# Patient Record
Sex: Male | Born: 1950 | Race: White | Hispanic: No | Marital: Single | Smoking: Never smoker
Health system: Southern US, Community
[De-identification: ages and names within clinical notes are randomized; demographics above are authoritative.]

## PROBLEM LIST (undated history)

## (undated) DIAGNOSIS — I4891 Unspecified atrial fibrillation: Secondary | ICD-10-CM

## (undated) DIAGNOSIS — N189 Chronic kidney disease, unspecified: Secondary | ICD-10-CM

## (undated) DIAGNOSIS — I350 Nonrheumatic aortic (valve) stenosis: Secondary | ICD-10-CM

## (undated) DIAGNOSIS — I639 Cerebral infarction, unspecified: Secondary | ICD-10-CM

## (undated) DIAGNOSIS — I739 Peripheral vascular disease, unspecified: Secondary | ICD-10-CM

## (undated) DIAGNOSIS — I499 Cardiac arrhythmia, unspecified: Secondary | ICD-10-CM

---

## 2017-08-22 ENCOUNTER — Emergency Department (HOSPITAL_COMMUNITY): Payer: Medicare Other

## 2017-08-22 ENCOUNTER — Inpatient Hospital Stay (HOSPITAL_COMMUNITY)
Admission: EM | Admit: 2017-08-22 | Discharge: 2017-09-05 | DRG: 981 | Disposition: A | Discharge status: Skilled Nursing Facility | Payer: Medicare Other | Attending: Internal Medicine | Admitting: Internal Medicine

## 2017-08-22 ENCOUNTER — Encounter (HOSPITAL_COMMUNITY): Payer: Self-pay | Admitting: Emergency Medicine

## 2017-08-22 DIAGNOSIS — D649 Anemia, unspecified: Secondary | ICD-10-CM | POA: Diagnosis not present

## 2017-08-22 DIAGNOSIS — M79A22 Nontraumatic compartment syndrome of left lower extremity: Secondary | ICD-10-CM | POA: Diagnosis present

## 2017-08-22 DIAGNOSIS — E860 Dehydration: Secondary | ICD-10-CM

## 2017-08-22 DIAGNOSIS — I639 Cerebral infarction, unspecified: Secondary | ICD-10-CM

## 2017-08-22 DIAGNOSIS — I63412 Cerebral infarction due to embolism of left middle cerebral artery: Secondary | ICD-10-CM | POA: Diagnosis not present

## 2017-08-22 DIAGNOSIS — I69351 Hemiplegia and hemiparesis following cerebral infarction affecting right dominant side: Secondary | ICD-10-CM | POA: Diagnosis not present

## 2017-08-22 DIAGNOSIS — G8191 Hemiplegia, unspecified affecting right dominant side: Secondary | ICD-10-CM | POA: Diagnosis not present

## 2017-08-22 DIAGNOSIS — I998 Other disorder of circulatory system: Secondary | ICD-10-CM | POA: Diagnosis present

## 2017-08-22 DIAGNOSIS — Z66 Do not resuscitate: Secondary | ICD-10-CM | POA: Diagnosis present

## 2017-08-22 DIAGNOSIS — I5043 Acute on chronic combined systolic (congestive) and diastolic (congestive) heart failure: Secondary | ICD-10-CM

## 2017-08-22 DIAGNOSIS — R29810 Facial weakness: Secondary | ICD-10-CM | POA: Diagnosis present

## 2017-08-22 DIAGNOSIS — I959 Hypotension, unspecified: Secondary | ICD-10-CM | POA: Diagnosis not present

## 2017-08-22 DIAGNOSIS — R29703 NIHSS score 3: Secondary | ICD-10-CM | POA: Diagnosis present

## 2017-08-22 DIAGNOSIS — R300 Dysuria: Secondary | ICD-10-CM | POA: Diagnosis present

## 2017-08-22 DIAGNOSIS — E870 Hyperosmolality and hypernatremia: Secondary | ICD-10-CM | POA: Diagnosis present

## 2017-08-22 DIAGNOSIS — I7092 Chronic total occlusion of artery of the extremities: Secondary | ICD-10-CM | POA: Diagnosis not present

## 2017-08-22 DIAGNOSIS — R4701 Aphasia: Secondary | ICD-10-CM | POA: Diagnosis present

## 2017-08-22 DIAGNOSIS — I739 Peripheral vascular disease, unspecified: Secondary | ICD-10-CM | POA: Diagnosis not present

## 2017-08-22 DIAGNOSIS — I69391 Dysphagia following cerebral infarction: Secondary | ICD-10-CM

## 2017-08-22 DIAGNOSIS — D62 Acute posthemorrhagic anemia: Secondary | ICD-10-CM | POA: Diagnosis not present

## 2017-08-22 DIAGNOSIS — R1312 Dysphagia, oropharyngeal phase: Secondary | ICD-10-CM | POA: Diagnosis not present

## 2017-08-22 DIAGNOSIS — R531 Weakness: Secondary | ICD-10-CM | POA: Insufficient documentation

## 2017-08-22 DIAGNOSIS — R4182 Altered mental status, unspecified: Secondary | ICD-10-CM | POA: Diagnosis not present

## 2017-08-22 DIAGNOSIS — M79A21 Nontraumatic compartment syndrome of right lower extremity: Secondary | ICD-10-CM | POA: Diagnosis present

## 2017-08-22 DIAGNOSIS — R471 Dysarthria and anarthria: Secondary | ICD-10-CM | POA: Diagnosis not present

## 2017-08-22 DIAGNOSIS — G936 Cerebral edema: Secondary | ICD-10-CM | POA: Diagnosis present

## 2017-08-22 DIAGNOSIS — I743 Embolism and thrombosis of arteries of the lower extremities: Secondary | ICD-10-CM | POA: Diagnosis present

## 2017-08-22 DIAGNOSIS — E871 Hypo-osmolality and hyponatremia: Secondary | ICD-10-CM | POA: Diagnosis not present

## 2017-08-22 DIAGNOSIS — R001 Bradycardia, unspecified: Secondary | ICD-10-CM | POA: Diagnosis not present

## 2017-08-22 DIAGNOSIS — M6281 Muscle weakness (generalized): Secondary | ICD-10-CM | POA: Diagnosis not present

## 2017-08-22 DIAGNOSIS — N179 Acute kidney failure, unspecified: Secondary | ICD-10-CM | POA: Diagnosis not present

## 2017-08-22 DIAGNOSIS — Z9181 History of falling: Secondary | ICD-10-CM | POA: Diagnosis not present

## 2017-08-22 DIAGNOSIS — Z823 Family history of stroke: Secondary | ICD-10-CM | POA: Diagnosis not present

## 2017-08-22 DIAGNOSIS — M6282 Rhabdomyolysis: Secondary | ICD-10-CM | POA: Diagnosis not present

## 2017-08-22 DIAGNOSIS — Z48812 Encounter for surgical aftercare following surgery on the circulatory system: Secondary | ICD-10-CM | POA: Diagnosis not present

## 2017-08-22 DIAGNOSIS — D72829 Elevated white blood cell count, unspecified: Secondary | ICD-10-CM | POA: Diagnosis present

## 2017-08-22 DIAGNOSIS — E785 Hyperlipidemia, unspecified: Secondary | ICD-10-CM | POA: Diagnosis present

## 2017-08-22 DIAGNOSIS — R2681 Unsteadiness on feet: Secondary | ICD-10-CM | POA: Diagnosis not present

## 2017-08-22 DIAGNOSIS — R41841 Cognitive communication deficit: Secondary | ICD-10-CM | POA: Diagnosis not present

## 2017-08-22 DIAGNOSIS — R1310 Dysphagia, unspecified: Secondary | ICD-10-CM | POA: Diagnosis present

## 2017-08-22 DIAGNOSIS — I749 Embolism and thrombosis of unspecified artery: Secondary | ICD-10-CM

## 2017-08-22 DIAGNOSIS — I480 Paroxysmal atrial fibrillation: Secondary | ICD-10-CM | POA: Diagnosis present

## 2017-08-22 DIAGNOSIS — I495 Sick sinus syndrome: Secondary | ICD-10-CM | POA: Diagnosis present

## 2017-08-22 DIAGNOSIS — R0682 Tachypnea, not elsewhere classified: Secondary | ICD-10-CM

## 2017-08-22 DIAGNOSIS — L8990 Pressure ulcer of unspecified site, unspecified stage: Secondary | ICD-10-CM | POA: Insufficient documentation

## 2017-08-22 DIAGNOSIS — I69920 Aphasia following unspecified cerebrovascular disease: Secondary | ICD-10-CM | POA: Diagnosis not present

## 2017-08-22 DIAGNOSIS — G8190 Hemiplegia, unspecified affecting unspecified side: Secondary | ICD-10-CM | POA: Diagnosis not present

## 2017-08-22 DIAGNOSIS — R491 Aphonia: Secondary | ICD-10-CM | POA: Diagnosis not present

## 2017-08-22 DIAGNOSIS — E869 Volume depletion, unspecified: Secondary | ICD-10-CM | POA: Diagnosis present

## 2017-08-22 DIAGNOSIS — G464 Cerebellar stroke syndrome: Secondary | ICD-10-CM | POA: Diagnosis not present

## 2017-08-22 HISTORY — DX: Cerebral infarction, unspecified: I63.9

## 2017-08-22 HISTORY — DX: Unspecified atrial fibrillation: I48.91

## 2017-08-22 HISTORY — DX: Peripheral vascular disease, unspecified: I73.9

## 2017-08-22 LAB — PROTIME-INR
INR: 1.09
Prothrombin Time: 14 seconds (ref 11.4–15.2)

## 2017-08-22 LAB — I-STAT CHEM 8, ED
BUN: 46 mg/dL — ABNORMAL HIGH (ref 6–20)
CHLORIDE: 118 mmol/L — AB (ref 101–111)
Calcium, Ion: 1 mmol/L — ABNORMAL LOW (ref 1.15–1.40)
Creatinine, Ser: 1.7 mg/dL — ABNORMAL HIGH (ref 0.61–1.24)
Glucose, Bld: 118 mg/dL — ABNORMAL HIGH (ref 65–99)
HEMATOCRIT: 52 % (ref 39.0–52.0)
Hemoglobin: 17.7 g/dL — ABNORMAL HIGH (ref 13.0–17.0)
POTASSIUM: 4.5 mmol/L (ref 3.5–5.1)
SODIUM: 149 mmol/L — AB (ref 135–145)
TCO2: 22 mmol/L (ref 22–32)

## 2017-08-22 LAB — CBC WITH DIFFERENTIAL/PLATELET
Basophils Absolute: 0 10*3/uL (ref 0.0–0.1)
Basophils Relative: 0 %
Eosinophils Absolute: 0 10*3/uL (ref 0.0–0.7)
Eosinophils Relative: 0 %
HEMATOCRIT: 50.6 % (ref 39.0–52.0)
Hemoglobin: 17.2 g/dL — ABNORMAL HIGH (ref 13.0–17.0)
LYMPHS ABS: 1.2 10*3/uL (ref 0.7–4.0)
LYMPHS PCT: 8 %
MCH: 31.6 pg (ref 26.0–34.0)
MCHC: 34 g/dL (ref 30.0–36.0)
MCV: 92.8 fL (ref 78.0–100.0)
MONO ABS: 1 10*3/uL (ref 0.1–1.0)
MONOS PCT: 6 %
NEUTROS ABS: 13.1 10*3/uL — AB (ref 1.7–7.7)
Neutrophils Relative %: 86 %
Platelets: 185 10*3/uL (ref 150–400)
RBC: 5.45 MIL/uL (ref 4.22–5.81)
RDW: 13.4 % (ref 11.5–15.5)
WBC: 15.4 10*3/uL — ABNORMAL HIGH (ref 4.0–10.5)

## 2017-08-22 LAB — ETHANOL: Alcohol, Ethyl (B): <5 mg/dL (ref <5)

## 2017-08-22 LAB — I-STAT CG4 LACTIC ACID, ED: Lactic Acid, Venous: 1.6 mmol/L (ref 0.5–1.9)

## 2017-08-22 LAB — COMPREHENSIVE METABOLIC PANEL
ALK PHOS: 56 U/L (ref 38–126)
ALT: 30 U/L (ref 17–63)
AST: 47 U/L — AB (ref 15–41)
Albumin: 4.1 g/dL (ref 3.5–5.0)
Anion gap: 12 (ref 5–15)
BUN: 45 mg/dL — AB (ref 6–20)
CHLORIDE: 113 mmol/L — AB (ref 101–111)
CO2: 21 mmol/L — ABNORMAL LOW (ref 22–32)
CREATININE: 1.64 mg/dL — AB (ref 0.61–1.24)
Calcium: 9.3 mg/dL (ref 8.9–10.3)
GFR calc Af Amer: 48 mL/min — ABNORMAL LOW (ref >60)
GFR calc non Af Amer: 42 mL/min — ABNORMAL LOW (ref >60)
Glucose, Bld: 117 mg/dL — ABNORMAL HIGH (ref 65–99)
Potassium: 4.3 mmol/L (ref 3.5–5.1)
SODIUM: 146 mmol/L — AB (ref 135–145)
Total Bilirubin: 1.5 mg/dL — ABNORMAL HIGH (ref 0.3–1.2)
Total Protein: 8.2 g/dL — ABNORMAL HIGH (ref 6.5–8.1)

## 2017-08-22 LAB — URINALYSIS, ROUTINE W REFLEX MICROSCOPIC
Bilirubin Urine: NEGATIVE
Glucose, UA: NEGATIVE mg/dL
Ketones, ur: 5 mg/dL — AB
LEUKOCYTES UA: NEGATIVE
Nitrite: NEGATIVE
PROTEIN: 100 mg/dL — AB
Renal Epithelial: 6
Specific Gravity, Urine: 1.027 (ref 1.005–1.030)
Squamous Epithelial / HPF: NONE SEEN
pH: 5 (ref 5.0–8.0)

## 2017-08-22 LAB — RAPID URINE DRUG SCREEN, HOSP PERFORMED
AMPHETAMINES: NOT DETECTED
Barbiturates: NOT DETECTED
Benzodiazepines: NOT DETECTED
Cocaine: NOT DETECTED
OPIATES: NOT DETECTED
Tetrahydrocannabinol: NOT DETECTED

## 2017-08-22 LAB — ACETAMINOPHEN LEVEL

## 2017-08-22 LAB — TROPONIN I: Troponin I: <0.03 ng/mL (ref <0.03)

## 2017-08-22 LAB — BLOOD GAS, VENOUS
Acid-base deficit: 1.5 mmol/L (ref 0.0–2.0)
Bicarbonate: 23.9 mmol/L (ref 20.0–28.0)
FIO2: 0.21
O2 CONTENT: 21 L/min
O2 SAT: 99.1 %
PCO2 VEN: 32.2 mmHg — AB (ref 44.0–60.0)
PH VEN: 7.448 — AB (ref 7.250–7.430)
PO2 VEN: 197 mmHg — AB (ref 32.0–45.0)

## 2017-08-22 LAB — SALICYLATE LEVEL: Salicylate Lvl: <7 mg/dL (ref 2.8–30.0)

## 2017-08-22 LAB — I-STAT TROPONIN, ED: Troponin i, poc: 0.02 ng/mL (ref 0.00–0.08)

## 2017-08-22 LAB — CK: CK TOTAL: 709 U/L — AB (ref 49–397)

## 2017-08-22 LAB — AMMONIA: Ammonia: 23 umol/L (ref 9–35)

## 2017-08-22 MED ORDER — STROKE: EARLY STAGES OF RECOVERY BOOK
Freq: Once | Status: AC
  Administered 2017-08-23: 08:00:00
  Filled 2017-08-22: qty 1

## 2017-08-22 MED ORDER — SODIUM CHLORIDE 0.9 % IV BOLUS (SEPSIS)
1000.0000 mL | Freq: Once | INTRAVENOUS | Status: AC
  Administered 2017-08-22: 1000 mL via INTRAVENOUS

## 2017-08-22 MED ORDER — SODIUM CHLORIDE 0.9 % IV BOLUS (SEPSIS)
2000.0000 mL | Freq: Once | INTRAVENOUS | Status: AC
  Administered 2017-08-22: 2000 mL via INTRAVENOUS

## 2017-08-22 MED ORDER — ENOXAPARIN SODIUM 40 MG/0.4ML ~~LOC~~ SOLN
40.0000 mg | SUBCUTANEOUS | Status: DC
  Administered 2017-08-22 – 2017-08-25 (×4): 40 mg via SUBCUTANEOUS
  Filled 2017-08-22 (×4): qty 0.4

## 2017-08-22 MED ORDER — SODIUM CHLORIDE 0.9 % IV SOLN
INTRAVENOUS | Status: DC
  Administered 2017-08-23: 01:00:00 via INTRAVENOUS

## 2017-08-22 MED ORDER — DEXTROSE-NACL 5-0.2 % IV SOLN
INTRAVENOUS | Status: DC
  Administered 2017-08-22: 22:00:00 via INTRAVENOUS

## 2017-08-22 MED ORDER — PIPERACILLIN-TAZOBACTAM 3.375 G IVPB 30 MIN
3.3750 g | Freq: Once | INTRAVENOUS | Status: AC
  Administered 2017-08-22: 3.375 g via INTRAVENOUS
  Filled 2017-08-22: qty 50

## 2017-08-22 MED ORDER — VANCOMYCIN HCL IN DEXTROSE 1-5 GM/200ML-% IV SOLN
1000.0000 mg | Freq: Once | INTRAVENOUS | Status: AC
  Administered 2017-08-22: 1000 mg via INTRAVENOUS
  Filled 2017-08-22: qty 200

## 2017-08-22 MED ORDER — SENNOSIDES-DOCUSATE SODIUM 8.6-50 MG PO TABS
1.0000 | ORAL_TABLET | Freq: Every evening | ORAL | Status: DC | PRN
  Administered 2017-08-25 – 2017-08-31 (×2): 1 via ORAL
  Filled 2017-08-22 (×2): qty 1

## 2017-08-22 NOTE — H&P (Signed)
[facility] History and Physical  ELDRICK PENICK RCV:[id number] DOB: January 07, 1951 DOA: 08/22/2017  Referring physician: Dr Dayna Barker, ED MD PCP: Patient, No Pcp Per   Chief Complaint: Altered MS  HPI: [name] [name] is a 66 y.o. male with unknown prior medical history, per ED notes a family member found him on the floor in urine, they went by his home because he hadn't heard from him in two days.  They asked the mother who was at the home how long this had been going and she also stated two days.  Upon presentation the patient was completely flaccid R side, oriented name only, unable to speak and wouldn't follow commands.  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  We are asked to see for admission.    Patient unable to speak, but does weakly respond to simple questions.    Per pt's step- brother, Nicole Kindred, the patient has never been sick.  Walks every day, doesn't smoke or drink.  He grew up in [place], [place].  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  Never went to the doctor, never complained about anything.  He retired 5-10 yrs ago.  He doesn't take any medication that the step-brother knows of.     Their mother had a stroke last year and the patient has been looking after her. It can be stressful for the stepbrother.      ROS  n/a   Past Medical History History reviewed. No pertinent past medical history. Past Surgical History History reviewed. No pertinent surgical history. Family History No family history on file. Social History  has no tobacco, alcohol, and drug history on file. Allergies Not on File Home medications Prior to Admission medications   Not on File   Liver Function Tests  Recent Labs Lab 08/22/17 1612  AST 47*  ALT 30  ALKPHOS 56  BILITOT 1.5*  PROT 8.2*  ALBUMIN 4.1   No results for input(s): LIPASE, AMYLASE in the last 168 hours. CBC  Recent Labs Lab 08/22/17 1612 08/22/17 1624  WBC 15.4*  --    NEUTROABS 13.1*  --   HGB 17.2* 17.7*  HCT 50.6 52.0  MCV 92.8  --   PLT 185  --    Basic Metabolic Panel  Recent Labs Lab 08/22/17 1612 08/22/17 1624  NA 146* 149*  K 4.3 4.5  CL 113* 118*  CO2 21*  --   GLUCOSE 117* 118*  BUN 45* 46*  CREATININE 1.64* 1.70*  CALCIUM 9.3  --      Vitals:   08/22/17 1730 08/22/17 1800 08/22/17 1830 08/22/17 1845  BP: (!) 145/68 140/67 (!) 123/53   Pulse: (!) 48 (!) 47    Resp: (!) 21 (!) _0 Temp: 99.5 F (37.5 C) 99 F (37.2 C) 99.1 F (37.3 C) 98.8 F (37.1 C)  TempSrc:      SpO2: 94% 94%    Weight:      Height:       Exam: Gen dehydrated, dry mouth, eyes barely open, blinks eyes and squeezes w/ L hand to command No rash, cyanosis or gangrene Sclera anicteric No jvd or bruits Chest clear bilat RRR no MRG Abd soft ntnd no mass or ascites +bs GU normal male MS no joint effusions or deformity Ext no LE edema / no wounds or ulcers Neuro is lethargic, minimally awake, follows only a few simple commands, very weak Not moving R arm  or leg at all   Na 146  K 4.3  CO2 21  BUN 45  Cr 1.64   Ca 9.3  Alb 4.1  Tbili 1.5 AST 47  ALT 30 eGFR 45   Trop  0.02   Venous LA 1.60  CPK 709 WBC 15k  Hb 17  plt 185 UA > 0-5 rbc/ 0-5 wbc/ many bact/ no epis, 5.0, prot 100 UDS negative   CXR (independ reviewed) > no active disease EKG > Sinus rhythm Posterior infarct, old Minimal ST depression, anterolateral leads  CT head > 1. Acute large LEFT MCA territory infarct with LEFT basal ganglia suspected petechial hemorrhage. 3 mm LEFT-to-RIGHT midline shift without ventricular entrapment or hydrocephalus   Assessment: 1.  Acute L MCA CVA - last seen 2-3 days prior to presentation.  Dense R hemiparesis and not speaking at all, but very dehydrated as well at this time.  No known underlying risk factors for stroke.  No leg swelling to suggest DVT.  NSR on EKG.  NO chronic medical conditions.   2.  Vol depletion 3.  AKI - prob just vol  related/ hemoconcentration 4.  Leukocytosis - UA and CXR are wnl.     Plan - admit, lots of IVF's, supportive care, neuro w/u.  Telemetry x 24 hrs.     Roney Jaffe D [facility] Pager [phone]   If 7PM-7AM, please contact night-coverage [email or web address] Password [email or web address] 08/22/2017, 7:07 PM

## 2017-08-22 NOTE — ED Provider Notes (Signed)
Emergency Department Provider Note   I have reviewed the triage vital signs and the nursing notes.   HISTORY  Chief Complaint Altered Mental Status   HPI [name] [name] is a 65 y.o. male with unknown medical history. His history is obtained from the nurse who got it from [facility] is very limited. It sounds like the patient was on the floor for couple days and the brother stranding get a hold of him and could not so went over there and called EMS. The mother apparently lives there but did not find it odd that he was laying on the floor for 2 days. It is unknown whether or not he has any neuro deficits at baseline and as far as I know he is normal and few to no medical problems.  LEVEL V CAVEAT 2/2 Altered Mental Status and not verbal  History reviewed. No pertinent past medical history.  Patient Active Problem List   Diagnosis Date Noted  . Right hemiparesis (HCC) 08/22/2017  . Volume depletion 08/22/2017  . Altered mental status 08/22/2017  . Aphasia 08/22/2017  . AKI (acute kidney injury) (HCC) 08/22/2017  . General weakness 08/22/2017  . Acute cerebrovascular accident (CVA) (HCC) 08/22/2017  . Acute CVA (cerebrovascular accident) (HCC) 08/22/2017    History reviewed. No pertinent surgical history.    Allergies Patient has no known allergies.  No family history on file.  Social History Social History  Substance Use Topics  . Smoking status: Not on file  . Smokeless tobacco: Not on file  . Alcohol use Not on file    Review of Systems  LEVEL V CAVEAT 2/2 Altered Mental Status and not verbal ____________________________________________  PHYSICAL EXAM:  VITAL SIGNS: ED Triage Vitals [08/22/17 1618]  Enc Vitals Group     BP (!) 143/80     Pulse Rate 64     Resp 20     Temp (!) 96.8 F (36 C)     Temp Source Core     SpO2 95 %     Weight 200 lb (90.7 kg)     Height 6\' 2"  (1.88 m)     Head Circumference      Peak Flow      Pain Score      Pain  Loc      Pain Edu?      Excl. in GC?     Constitutional: Alert. Well appearing and in no acute distress. Eyes: Conjunctivae are normal. PERRL. EOMI. Head: Atraumatic. Nose: No congestion/rhinnorhea. Mouth/Throat: Mucous membranes are dry.  Oropharynx non-erythematous. Neck: No stridor.  No meningeal signs.   Cardiovascular: Normal rate, regular rhythm. Good peripheral circulation. Grossly normal heart sounds.   Respiratory: Normal respiratory effort.  No retractions. Lungs CTAB. Gastrointestinal: Soft and nontender. No distention.  Musculoskeletal: No lower extremity tenderness nor edema. No gross deformities of extremities. Neurologic:  Not verbally communicating, appears to not be moving his right side. otherwise not able to participate with physical examination.   Skin:  Skin is warm, dry and intact. No rash noted.  ____________________________________________   LABS (all labs ordered are listed, but only abnormal results are displayed)  Labs Reviewed  COMPREHENSIVE METABOLIC PANEL - Abnormal; Notable for the following:       Result Value   Sodium 146 (*)    Chloride 113 (*)    CO2 21 (*)    Glucose, Bld 117 (*)    BUN 45 (*)    Creatinine, Ser 1.64 (*)  Total Protein 8.2 (*)    AST 47 (*)    Total Bilirubin 1.5 (*)    GFR calc non Af Amer 42 (*)    GFR calc Af Amer 48 (*)    All other components within normal limits  CBC WITH DIFFERENTIAL/PLATELET - Abnormal; Notable for the following:    WBC 15.4 (*)    Hemoglobin 17.2 (*)    Neutro Abs 13.1 (*)    All other components within normal limits  URINALYSIS, ROUTINE W REFLEX MICROSCOPIC - Abnormal; Notable for the following:    Hgb urine dipstick MODERATE (*)    Ketones, ur 5 (*)    Protein, ur 100 (*)    Bacteria, UA MANY (*)    All other components within normal limits  BLOOD GAS, VENOUS - Abnormal; Notable for the following:    pH, Ven 7.448 (*)    pCO2, Ven 32.2 (*)    pO2, Ven 197.0 (*)    All other  components within normal limits  ACETAMINOPHEN LEVEL - Abnormal; Notable for the following:    Acetaminophen (Tylenol), Serum <10 (*)    All other components within normal limits  CK - Abnormal; Notable for the following:    Total CK 709 (*)    All other components within normal limits  I-STAT CHEM 8, ED - Abnormal; Notable for the following:    Sodium 149 (*)    Chloride 118 (*)    BUN 46 (*)    Creatinine, Ser 1.70 (*)    Glucose, Bld 118 (*)    Calcium, Ion 1.00 (*)    Hemoglobin 17.7 (*)    All other components within normal limits  CULTURE, BLOOD (ROUTINE X 2)  CULTURE, BLOOD (ROUTINE X 2)  RAPID URINE DRUG SCREEN, HOSP PERFORMED  PROTIME-INR  AMMONIA  ETHANOL  SALICYLATE LEVEL  TROPONIN I  HEMOGLOBIN A1C  LIPID PANEL  BASIC METABOLIC PANEL  CBC  TROPONIN I  TROPONIN I  CBG MONITORING, ED  I-STAT TROPONIN, ED  I-STAT CG4 LACTIC ACID, ED   ____________________________________________  EKG   EKG Interpretation  Date/Time:  Tuesday August 22 2017 16:12:44 EDT Ventricular Rate:  62 PR Interval:    QRS Duration: 101 QT Interval:  439 QTC Calculation: 446 R Axis:   21 Text Interpretation:  Sinus rhythm Posterior infarct, old Minimal ST depression, anterolateral leads Baseline wander in lead(s) V2 Confirmed by Marily MemosMesner, Jarvin Ogren [phone]([id number]) on 08/22/2017 5:09:37 PM       ____________________________________________  RADIOLOGY  Dg Chest 2 View  Result Date: 08/22/2017 CLINICAL DATA:  Initial evaluation for acute altered mental status. EXAM: CHEST  2 VIEW COMPARISON:  None. FINDINGS: Cardiac and mediastinal silhouettes are within normal limits. Lungs hypoinflated. No focal infiltrates. No pulmonary edema or pleural effusion. No pneumothorax. No acute osseus abnormality. IMPRESSION: No active cardiopulmonary disease. Electronically Signed   By: Rise MuBenjamin  McClintock M.D.   On: 08/22/2017 18:19   Ct Head Wo Contrast  Result Date: 08/22/2017 CLINICAL DATA:  RIGHT-sided  weakness. Altered level of consciousness. Found fall on floor in urine. EXAM: CT HEAD WITHOUT CONTRAST TECHNIQUE: Contiguous axial images were obtained from the base of the skull through the vertex without intravenous contrast. COMPARISON:  None. FINDINGS: BRAIN: Blurring of the LEFT frontotemporal parietal gray-white matter junction with wedge-like hypodensity extending into the LEFT basal ganglia, involving the LEFT insula. Punctate densities LEFT basal ganglia. Regional mass effect with partially effaced LEFT lateral ventricle, no hydrocephalus. 3 mm LEFT-to-RIGHT midline shift. No  intraparenchymal hemorrhage. No abnormal extra-axial fluid collections. Basal cisterns are patent. VASCULAR: Dense LEFT MCA, potentially overestimated by surrounding parenchymal hypodensity. Mild calcific atherosclerosis carotid siphons. SKULL: No skull fracture. No significant scalp soft tissue swelling. 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 SINUSES/ORBITS: The mastoid air-cells and included paranasal sinuses are well-aerated. Soft tissue within the external auditory canals most compatible with cerumen. The included ocular globes and orbital contents are non-suspicious. OTHER: None. IMPRESSION: 1. Acute large LEFT MCA territory infarct with LEFT basal ganglia suspected petechial hemorrhage. 3 mm LEFT-to-RIGHT midline shift without ventricular entrapment or hydrocephalus. 2. Critical Value/emergent results were called by telephone at the time of interpretation on 08/22/2017 at 5:57 pm to Dr. Marily Memos , who verbally acknowledged these results. Electronically Signed   By: Awilda Metro M.D.   On: 08/22/2017 17:59   ____________________________________________   PROCEDURES  Procedure(s) performed:   Procedures ____________________________________________   INITIAL IMPRESSION / ASSESSMENT AND PLAN / ED COURSE  Pertinent labs & imaging results that were available during my care of the patient were reviewed  by me and considered in my medical decision making (see chart for details).  Patient with large MCA stroke but last known normal with 3 days ago so not a candidate for TPA. Also with some AK Isis started on fluids. Questionable urinary tract infection w/ elevated wbc so vanc/zosyn given. Discussed with hospitalist who will admit.  ____________________________________________  FINAL CLINICAL IMPRESSION(S) / ED DIAGNOSES  Final diagnoses:  AKI (acute kidney injury) (HCC)  Cerebrovascular accident (CVA), unspecified mechanism (HCC)  Altered mental status, unspecified altered mental status type  Dehydration    MEDICATIONS GIVEN DURING THIS VISIT:  Medications   stroke: mapping our early stages of recovery book (not administered)  senna-docusate (Senokot-S) tablet 1 tablet (not administered)  enoxaparin (LOVENOX) injection 40 mg (40 mg Subcutaneous Given 08/22/17 2210)  sodium chloride 0.9 % bolus 1,000 mL (1,000 mLs Intravenous New Bag/Given 08/22/17 2207)  0.9 %  sodium chloride infusion (not administered)  sodium chloride 0.9 % bolus 1,000 mL (0 mLs Intravenous Stopped 08/22/17 1808)  sodium chloride 0.9 % bolus 2,000 mL (0 mLs Intravenous Stopped 08/22/17 2037)  vancomycin (VANCOCIN) IVPB 1000 mg/200 mL premix (0 mg Intravenous Stopped 08/22/17 1935)  piperacillin-tazobactam (ZOSYN) IVPB 3.375 g (0 g Intravenous Stopped 08/22/17 1838)    NEW OUTPATIENT MEDICATIONS STARTED DURING THIS VISIT:  There are no discharge medications for this patient.   Note:  This document was prepared using Dragon voice recognition software and may include unintentional dictation errors.    Duncan Alejandro, Barbara Cower, MD 08/23/17 [phone]

## 2017-08-22 NOTE — ED Triage Notes (Signed)
Per Ems brother found him in the floor in urine, went by his home because he hadn't heard from him in two days.  They asked the mother who was at the home how long this had been going and she also stated two days.  Pt is completely flaccid right side.  Pt is oriented name only.  Pt is unable to speak at this time and cannot follow commands.   CBG 134  HR 58

## 2017-08-23 ENCOUNTER — Inpatient Hospital Stay (HOSPITAL_COMMUNITY): Payer: Medicare Other

## 2017-08-23 ENCOUNTER — Encounter (HOSPITAL_COMMUNITY): Payer: Self-pay

## 2017-08-23 DIAGNOSIS — I340 Nonrheumatic mitral (valve) insufficiency: Secondary | ICD-10-CM

## 2017-08-23 LAB — BASIC METABOLIC PANEL
ANION GAP: 7 (ref 5–15)
BUN: 37 mg/dL — AB (ref 6–20)
CHLORIDE: 119 mmol/L — AB (ref 101–111)
CO2: 24 mmol/L (ref 22–32)
Calcium: 8.1 mg/dL — ABNORMAL LOW (ref 8.9–10.3)
Creatinine, Ser: 1.43 mg/dL — ABNORMAL HIGH (ref 0.61–1.24)
GFR calc Af Amer: 57 mL/min — ABNORMAL LOW (ref >60)
GFR calc non Af Amer: 49 mL/min — ABNORMAL LOW (ref >60)
Glucose, Bld: 106 mg/dL — ABNORMAL HIGH (ref 65–99)
POTASSIUM: 4.1 mmol/L (ref 3.5–5.1)
SODIUM: 150 mmol/L — AB (ref 135–145)

## 2017-08-23 LAB — COMPREHENSIVE METABOLIC PANEL
ALK PHOS: 44 U/L (ref 38–126)
ALT: 26 U/L (ref 17–63)
AST: 33 U/L (ref 15–41)
Albumin: 3.1 g/dL — ABNORMAL LOW (ref 3.5–5.0)
Anion gap: 8 (ref 5–15)
BILIRUBIN TOTAL: 1.3 mg/dL — AB (ref 0.3–1.2)
BUN: 34 mg/dL — AB (ref 6–20)
CALCIUM: 8.5 mg/dL — AB (ref 8.9–10.3)
CO2: 22 mmol/L (ref 22–32)
Chloride: 117 mmol/L — ABNORMAL HIGH (ref 101–111)
Creatinine, Ser: 1.24 mg/dL (ref 0.61–1.24)
GFR calc Af Amer: >60 mL/min (ref >60)
GFR calc non Af Amer: 58 mL/min — ABNORMAL LOW (ref >60)
GLUCOSE: 99 mg/dL (ref 65–99)
Potassium: 4.2 mmol/L (ref 3.5–5.1)
Sodium: 147 mmol/L — ABNORMAL HIGH (ref 135–145)
TOTAL PROTEIN: 6.4 g/dL — AB (ref 6.5–8.1)

## 2017-08-23 LAB — CBC
HEMATOCRIT: 46.4 % (ref 39.0–52.0)
HEMOGLOBIN: 15.2 g/dL (ref 13.0–17.0)
MCH: 31.5 pg (ref 26.0–34.0)
MCHC: 32.8 g/dL (ref 30.0–36.0)
MCV: 96.3 fL (ref 78.0–100.0)
Platelets: 133 10*3/uL — ABNORMAL LOW (ref 150–400)
RBC: 4.82 MIL/uL (ref 4.22–5.81)
RDW: 13.6 % (ref 11.5–15.5)
WBC: 14.6 10*3/uL — ABNORMAL HIGH (ref 4.0–10.5)

## 2017-08-23 LAB — ECHOCARDIOGRAM COMPLETE
Height: 74 in
Weight: 2768.98 oz

## 2017-08-23 LAB — LIPID PANEL
CHOLESTEROL: 149 mg/dL (ref 0–200)
HDL: 51 mg/dL (ref >40)
LDL Cholesterol: 78 mg/dL (ref 0–99)
Total CHOL/HDL Ratio: 2.9 RATIO
Triglycerides: 102 mg/dL (ref <150)
VLDL: 20 mg/dL (ref 0–40)

## 2017-08-23 LAB — GLUCOSE, CAPILLARY: Glucose-Capillary: 86 mg/dL (ref 65–99)

## 2017-08-23 LAB — TROPONIN I: Troponin I: <0.03 ng/mL (ref <0.03)

## 2017-08-23 LAB — MRSA PCR SCREENING: MRSA BY PCR: NEGATIVE

## 2017-08-23 LAB — HEMOGLOBIN A1C
HEMOGLOBIN A1C: 5.2 % (ref 4.8–5.6)
MEAN PLASMA GLUCOSE: 102.54 mg/dL

## 2017-08-23 LAB — CK: Total CK: 380 U/L (ref 49–397)

## 2017-08-23 MED ORDER — CHLORHEXIDINE GLUCONATE 0.12 % MT SOLN
15.0000 mL | Freq: Two times a day (BID) | OROMUCOSAL | Status: DC
  Administered 2017-08-23 – 2017-09-04 (×21): 15 mL via OROMUCOSAL
  Filled 2017-08-23 (×15): qty 15

## 2017-08-23 MED ORDER — ORAL CARE MOUTH RINSE
15.0000 mL | Freq: Two times a day (BID) | OROMUCOSAL | Status: DC
  Administered 2017-08-23 – 2017-09-04 (×16): 15 mL via OROMUCOSAL

## 2017-08-23 MED ORDER — SODIUM CHLORIDE 0.45 % IV SOLN
INTRAVENOUS | Status: DC
  Administered 2017-08-23 (×2): via INTRAVENOUS

## 2017-08-23 MED ORDER — ASPIRIN 300 MG RE SUPP
300.0000 mg | Freq: Every day | RECTAL | Status: DC
  Administered 2017-08-23 – 2017-08-25 (×3): 300 mg via RECTAL
  Filled 2017-08-23 (×3): qty 1

## 2017-08-23 MED ORDER — WHITE PETROLATUM GEL
Status: AC
  Administered 2017-08-23
  Filled 2017-08-23: qty 1

## 2017-08-23 NOTE — Progress Notes (Addendum)
[facility] PROGRESS NOTE  [name] CreekRichard P August ZOX:[id number] DOB: 11/18/1951 DOA: 08/22/2017   PCP: Patient, No Pcp Per     Assessment/Plan: Principal Problem:   Acute cerebrovascular accident (CVA) (HCC) Active Problems:   Right hemiparesis (HCC)   Volume depletion   Altered mental status   Aphasia   AKI (acute kidney injury) (HCC)   General weakness   Acute CVA (cerebrovascular accident) [facility](HCC)   y.o. male with unknown prior medical history, per ED notes a family member found him on the floor in urine, they went by his home because he hadn't heard from him in two days. Upon presentation the patient was completely flaccid R side, oriented name only, unable to speak and wouldn't follow commands. 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  Assessment and plan  1.  Acute L MCA CVA - last seen 2-3 days prior to presentation.  Dense R hemiparesis and not speaking at all, but no meaningful movements on the right side, aphasic. Also noted to be bradycardic but neurology feels is secondary to his large left MCA stroke and cytotoxic edema.  No known underlying risk factors for stroke.  No leg swelling to suggest DVT. Will obtain venous Doppler however to rule out DVT, MRI brain shows midline shift of 5 mm with cytotoxic edema  , carotid Doppler, 2-D echo,  Rectal aspirin.   PT/OT/speech. discussed with neurology , [facility] , discussed with Dr Wilford CornerArora who will evaluate the patient as soon as  patient arrives. Also notified Dr Molli KnockYacoub with ICU , about the transfer, just incase level of care needs to be escalated to ICU level 2.   dehydration/hypernatremia-change to half normal saline   3.  AKI - prob related to mild rhabdomyolysis, dehydration 4.  Leukocytosis - UA and CXR are wnl.    DVT prophylaxsis Lovenox  Code Status:  Full code   Family Communication: Discussed in detail with the patient, all imaging results, lab results  explained to the patient   Disposition Plan: 1-2 days       Consultants:  Neurology  Procedures:  None  Antibiotics: Anti-infectives    Start     Dose/Rate Route Frequency Ordered Stop   08/22/17 1715  vancomycin (VANCOCIN) IVPB 1000 mg/200 mL premix     1,000 mg 200 mL/hr over 60 Minutes Intravenous  Once 08/22/17 1710 08/22/17 1935   08/22/17 1715  piperacillin-tazobactam (ZOSYN) IVPB 3.375 g     3.375 g 100 mL/hr over 30 Minutes Intravenous  Once 08/22/17 1710 08/22/17 1838         HPI/Subjective: Aphasic, unable to move right upper and right lower extremity  Objective: Vitals:   08/23/17 0545 08/23/17 0600 08/23/17 0700 08/23/17 0742  BP: 113/64 115/62 (!) 113/59   Pulse: (!) 39 (!) 40 (!) 37 (!) 42  Resp: 14 12 16 11   Temp: 98.1 F (36.7 C) 98.1 F (36.7 C) 98.1 F (36.7 C) 98.2 F (36.8 C)  TempSrc:      SpO2: 97% 95% 95% 97%  Weight:      Height:        Intake/Output Summary (Last 24 hours) at 08/23/17 0947 Last data filed at 08/23/17 0500  Gross per 24 hour  Intake             1200 ml  Output              775  ml  Net              425 ml    Exam:  Examination:  General exam: Nonverbal, right facial droop Respiratory system: Clear to auscultation. Respiratory effort normal. Cardiovascular system: S1 & S2 heard, RRR. No JVD, murmurs, rubs, gallops or clicks. No pedal edema. Gastrointestinal system: Abdomen is nondistended, soft and nontender. No organomegaly or masses felt. Normal bowel sounds heard. Central nervous system: Alert and oriented.   aphasic Extremities: One on 5 motor strength in right upper right lower extremity Skin: No rashes, lesions or ulcers Psychiatry: Judgement  impaired    Data Reviewed: I have personally reviewed following labs and imaging studies  Micro Results Recent Results (from the past 240 hour(s))  Blood Culture (routine x 2)     Status: None (Preliminary result)   Collection Time: 08/22/17  4:40 PM   Result Value Ref Range Status   Specimen Description RIGHT ANTECUBITAL  Final   Special Requests   Final    BOTTLES DRAWN AEROBIC AND ANAEROBIC Blood Culture adequate volume   Culture NO GROWTH < 24 HOURS  Final   Report Status PENDING  Incomplete  Blood Culture (routine x 2)     Status: None (Preliminary result)   Collection Time: 08/22/17  4:44 PM  Result Value Ref Range Status   Specimen Description BLOOD LEFT HAND  Final   Special Requests   Final    BOTTLES DRAWN AEROBIC AND ANAEROBIC Blood Culture adequate volume   Culture NO GROWTH < 24 HOURS  Final   Report Status PENDING  Incomplete  MRSA PCR Screening     Status: None   Collection Time: 08/23/17  1:59 AM  Result Value Ref Range Status   MRSA by PCR NEGATIVE NEGATIVE Final    Comment:        The GeneXpert MRSA Assay (FDA approved for NASAL specimens only), is one component of a comprehensive MRSA colonization surveillance program. It is not intended to diagnose MRSA infection nor to guide or monitor treatment for MRSA infections.     Radiology Reports Dg Chest 2 View  Result Date: 08/22/2017 CLINICAL DATA:  Initial evaluation for acute altered mental status. EXAM: CHEST  2 VIEW COMPARISON:  None. FINDINGS: Cardiac and mediastinal silhouettes are within normal limits. Lungs hypoinflated. No focal infiltrates. No pulmonary edema or pleural effusion. No pneumothorax. No acute osseus abnormality. IMPRESSION: No active cardiopulmonary disease. Electronically Signed   By: Rise Mu M.D.   On: 08/22/2017 18:19   Ct Head Wo Contrast  Result Date: 08/22/2017 CLINICAL DATA:  RIGHT-sided weakness. Altered level of consciousness. Found fall on floor in urine. EXAM: CT HEAD WITHOUT CONTRAST TECHNIQUE: Contiguous axial images were obtained from the base of the skull through the vertex without intravenous contrast. COMPARISON:  None. FINDINGS: BRAIN: Blurring of the LEFT frontotemporal parietal gray-white matter junction  with wedge-like hypodensity extending into the LEFT basal ganglia, involving the LEFT insula. Punctate densities LEFT basal ganglia. Regional mass effect with partially effaced LEFT lateral ventricle, no hydrocephalus. 3 mm LEFT-to-RIGHT midline shift. No intraparenchymal hemorrhage. No abnormal extra-axial fluid collections. Basal cisterns are patent. VASCULAR: Dense LEFT MCA, potentially overestimated by surrounding parenchymal hypodensity. Mild calcific atherosclerosis carotid siphons. SKULL: No skull fracture. No significant scalp soft tissue swelling. 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 SINUSES/ORBITS: The mastoid air-cells and included paranasal sinuses are well-aerated. Soft tissue within the external auditory canals most compatible with cerumen. The included ocular globes and orbital contents are non-suspicious.  OTHER: None. IMPRESSION: 1. Acute large LEFT MCA territory infarct with LEFT basal ganglia suspected petechial hemorrhage. 3 mm LEFT-to-RIGHT midline shift without ventricular entrapment or hydrocephalus. 2. Critical Value/emergent results were called by telephone at the time of interpretation on 08/22/2017 at 5:57 pm to Dr. Marily Memos , who verbally acknowledged these results. Electronically Signed   By: Awilda Metro M.D.   On: 08/22/2017 17:59     CBC  Recent Labs Lab 08/22/17 1612 08/22/17 1624 08/23/17 0425  WBC 15.4*  --  14.6*  HGB 17.2* 17.7* 15.2  HCT 50.6 52.0 46.4  PLT 185  --  133*  MCV 92.8  --  96.3  MCH 31.6  --  31.5  MCHC 34.0  --  32.8  RDW 13.4  --  13.6  LYMPHSABS 1.2  --   --   MONOABS 1.0  --   --   EOSABS 0.0  --   --   BASOSABS 0.0  --   --     Chemistries   Recent Labs Lab 08/22/17 1612 08/22/17 1624 08/23/17 0425  NA 146* 149* 150*  K 4.3 4.5 4.1  CL 113* 118* 119*  CO2 21*  --  24  GLUCOSE 117* 118* 106*  BUN 45* 46* 37*  CREATININE 1.64* 1.70* 1.43*  CALCIUM 9.3  --  8.1*  AST 47*  --   --   ALT 30  --   --    ALKPHOS 56  --   --   BILITOT 1.5*  --   --    ------------------------------------------------------------------------------------------------------------------ estimated creatinine clearance is 55.7 mL/min (A) (by C-G formula based on SCr of 1.43 mg/dL (H)). ------------------------------------------------------------------------------------------------------------------ No results for input(s): HGBA1C in the last 72 hours. ------------------------------------------------------------------------------------------------------------------  Recent Labs  08/23/17 0425  CHOL 149  HDL 51  LDLCALC 78  TRIG 102  CHOLHDL 2.9   ------------------------------------------------------------------------------------------------------------------ No results for input(s): TSH, T4TOTAL, T3FREE, THYROIDAB in the last 72 hours.  Invalid input(s): FREET3 ------------------------------------------------------------------------------------------------------------------ No results for input(s): VITAMINB12, FOLATE, FERRITIN, TIBC, IRON, RETICCTPCT in the last 72 hours.  Coagulation profile  Recent Labs Lab 08/22/17 1612  INR 1.09    No results for input(s): DDIMER in the last 72 hours.  Cardiac Enzymes  Recent Labs Lab 08/22/17 2155 08/23/17 0425  TROPONINI <0.03 <0.03   ------------------------------------------------------------------------------------------------------------------ Invalid input(s): POCBNP   CBG: No results for input(s): GLUCAP in the last 168 hours.     Studies: Dg Chest 2 View  Result Date: 08/22/2017 CLINICAL DATA:  Initial evaluation for acute altered mental status. EXAM: CHEST  2 VIEW COMPARISON:  None. FINDINGS: Cardiac and mediastinal silhouettes are within normal limits. Lungs hypoinflated. No focal infiltrates. No pulmonary edema or pleural effusion. No pneumothorax. No acute osseus abnormality. IMPRESSION: No active cardiopulmonary disease.  Electronically Signed   By: Rise Mu M.D.   On: 08/22/2017 18:19   Ct Head Wo Contrast  Result Date: 08/22/2017 CLINICAL DATA:  RIGHT-sided weakness. Altered level of consciousness. Found fall on floor in urine. EXAM: CT HEAD WITHOUT CONTRAST TECHNIQUE: Contiguous axial images were obtained from the base of the skull through the vertex without intravenous contrast. COMPARISON:  None. FINDINGS: BRAIN: Blurring of the LEFT frontotemporal parietal gray-white matter junction with wedge-like hypodensity extending into the LEFT basal ganglia, involving the LEFT insula. Punctate densities LEFT basal ganglia. Regional mass effect with partially effaced LEFT lateral ventricle, no hydrocephalus. 3 mm LEFT-to-RIGHT midline shift. No intraparenchymal hemorrhage. No abnormal extra-axial fluid collections. Basal cisterns are  patent. VASCULAR: Dense LEFT MCA, potentially overestimated by surrounding parenchymal hypodensity. Mild calcific atherosclerosis carotid siphons. SKULL: No skull fracture. No significant scalp soft tissue swelling. 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 SINUSES/ORBITS: The mastoid air-cells and included paranasal sinuses are well-aerated. Soft tissue within the external auditory canals most compatible with cerumen. The included ocular globes and orbital contents are non-suspicious. OTHER: None. IMPRESSION: 1. Acute large LEFT MCA territory infarct with LEFT basal ganglia suspected petechial hemorrhage. 3 mm LEFT-to-RIGHT midline shift without ventricular entrapment or hydrocephalus. 2. Critical Value/emergent results were called by telephone at the time of interpretation on 08/22/2017 at 5:57 pm to Dr. Marily Memos , who verbally acknowledged these results. Electronically Signed   By: Awilda Metro M.D.   On: 08/22/2017 17:59      No results found for: HGBA1C Lab Results  Component Value Date   LDLCALC 78 08/23/2017   CREATININE 1.43 (H) 08/23/2017       Scheduled  Meds: . aspirin  300 mg Rectal Daily  . chlorhexidine  15 mL Mouth Rinse BID  . enoxaparin (LOVENOX) injection  40 mg Subcutaneous Q24H  . mouth rinse  15 mL Mouth Rinse q12n4p   Continuous Infusions: . sodium chloride 125 mL/hr at 08/23/17 0841     LOS: 1 day    Time spent: >30 MINS    Richarda Overlie  [facility] Pager [phone]. If 7PM-7AM, please contact night-coverage at [email or web address], password [facility] 08/23/2017, 9:47 AM  LOS: 1 day

## 2017-08-23 NOTE — Progress Notes (Signed)
PT Cancellation Note  Patient Details Name: [name] [name] MRN: [id number] DOB: 12/06/1951   Cancelled Treatment:    Reason Eval/Treat Not Completed: Medical issues which prohibited therapy. Treatment held per RN request due to bradycardia.   1:46 PM, 08/23/17 Ocie BobJames Camy Leder, MPT Physical Therapist with [facility] 336 [phone] office [phone] mobile phone

## 2017-08-23 NOTE — Plan of Care (Signed)
Problem: Education: Goal: Knowledge of disease or condition will improve Outcome: Progressing Spoke with family and explained patient condition and what to expect from here. Sister was understanding and has history of TIA in past.

## 2017-08-23 NOTE — Progress Notes (Signed)
SLP Cancellation Note  Patient Details Name: [name] [name] MRN: [id number] DOB: 01/03/1951   Cancelled treatment:       Reason Eval/Treat Not Completed: Patient at procedure or test/unavailable; Chart reviewed. Pt did not pass RN swallow screen and is NPO, but no BSE requested at this time per orders. 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 Pt currently down for MRI. SLP will check back later. Please clarify if order desired for BSE (Pt may not be ready yet, low HR). SLP will follow.  Thank you,  Havery MorosDabney Porter, CCC-SLP [phone]    PORTER,DABNEY 08/23/2017, 9:45 AM

## 2017-08-23 NOTE — Progress Notes (Signed)
[facility] arrived to [facility] Rm # [id number]. Pts VS stable, report was called to Amy and pts brother [name] was called and informed of transfer, room # and phone number of unit

## 2017-08-23 NOTE — Progress Notes (Signed)
Pt nonverbal upon admission. Unable to fully assess for stroke. Pt not able to follow commands and is very drowsy. Pt pulse kept trending down to as low as 34. MD was informed and new orders were placed to transfer patient to stepdown for close monitoring. 0100 vitals and neuro assessment completed before pt was transferred to ICU08.

## 2017-08-23 NOTE — Progress Notes (Signed)
Attempted to call patient's brother and sister to let them know of patient's need to [facility]. Neither brother or sister answered their phones. Will attempt to call again later.

## 2017-08-23 NOTE — Progress Notes (Signed)
OT Cancellation Note  Patient Details Name: [name] CreekRichard P [name] MRN: [id number] DOB: 10/31/1951   Cancelled Treatment:    Reason Eval/Treat Not Completed: Medical issues which prohibited therapy. Chart reviewed, discussed with PT. Pt has bed rest orders; PT reports nursing requested to hold-HR has been low throughout night, possible [facility].   Ezra SitesLeslie Mikeala Girdler, OTR/L  [phone] 08/23/2017, 9:05 AM

## 2017-08-23 NOTE — Progress Notes (Signed)
*  PRELIMINARY RESULTS* Echocardiogram 2D Echocardiogram has been performed.  [name] [name], Gen Clagg 08/23/2017, 4:32 PM

## 2017-08-23 NOTE — Progress Notes (Signed)
Called Brother- Orvan Julyony Barnes to inform him of patients admission and transfer to ICU. Asked him if he knew of any history or allergies or previous hospital admissions anywhere. Brother said no history or allergies that he is aware of and that Gerlene BurdockRichard has always been healthy. Doesn't drink or smoke and walks daily. [name] said him and his sister would be by this morning to visit him.

## 2017-08-23 NOTE — Consult Note (Signed)
[place] A. Merlene Laughter, MD     [email or web address]          Remus P Aydt is an 66 y.o. male.   ASSESSMENT/PLAN:  1. 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 The typical workup is in progress. This will be followed. Agree with aspirin, DVT prophylaxis and physical and occupational therapy along with speech evaluation. The patient will likely need a PEG long-term given the profound weakness on the right side.  2. Persistent bradycardia most likely due to large left hemispheric infarct  The patient is a 66 year old white male who lives with his mother. He apparently has not been heard from and over 2 days. The patient on presentation was found to be altered and unresponsive with flaccid right side. Imaging showed evidence of an acute infarct involving the left hemisphere. Medical history is unrevealing at this time. His blood pressure is currently finding during this hospitalization. 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 He has never had any doctor's visits and no significant past medical history. The review of systems is limited given the medication.  GENERAL: He appears to be in some discomfort but no acute distress.  HEENT: Supple. Atraumatic normocephalic. There is a moderate left gaze preference that can't be overcome passively.  ABDOMEN: soft  EXTREMITIES: No edema   BACK: Normal.  SKIN: Normal by inspection.    MENTAL STATUS: He is awake and alert but does not follow commands. He is completely mute.  CRANIAL NERVES: Pupils are equal, round and reactive to light; extra ocular movements are full, there is no significant nystagmus; visual fields; reveals a right homonymous hemianopia; upper and lower facial muscles are normal in strength and symmetric, there is no flattening of the nasolabial folds.  MOTOR:  Right upper extremity is flaccid 0/5 and right leg 2/5. He has antigravity strength on the left side.  COORDINATION: No tremors or dysmetria appreciated.  REFLEXES: Deep tendon reflexes are symmetrical and normal. Babinski reflexes are flexor bilaterally.   SENSATION: There is diminished response to pain on the right side but also somewhat diminished on the left side.    Head CT scan is reviewed in person. There is hypodensity seen mostly in the temporal area in a confluent white matter including the anterior temporal tip. There is involvement of the insular cortex and some patchy hypodensity involving the left parietal region. Most of the MCA territory is involved and that side. There is mild midline shift and some effacement of the frontal horn of the lateral ventricle.    Blood pressure (!) 113/59, pulse (!) 42, temperature 98.2 F (36.8 C), resp. rate 11, height '6\' 2"'  (1.88 m), weight 173 lb 1 oz (78.5 kg), SpO2 97 %.  History reviewed. No pertinent past medical history.  History reviewed. No pertinent surgical history.  No family history on file.  Social History:  has no tobacco, alcohol, and drug history on file.  Allergies: No Known Allergies  Medications: Prior to Admission medications   Not on File    Scheduled Meds: . aspirin  300 mg Rectal Daily  . chlorhexidine  15 mL Mouth Rinse BID  . enoxaparin (LOVENOX) injection  40 mg Subcutaneous Q24H  . mouth rinse  15 mL Mouth Rinse q12n4p   Continuous Infusions: . sodium chloride 125 mL/hr at 08/23/17 0841   PRN Meds:.senna-docusate  Results for orders placed or performed during the hospital encounter of 08/22/17 (from the past 48 hour(s))  Comprehensive metabolic panel     Status: Abnormal   Collection Time: 08/22/17  4:12 PM  Result Value Ref Range   Sodium 146 (H) 135 - 145 mmol/L   Potassium 4.3 3.5 - 5.1 mmol/L   Chloride 113 (H) 101 - 111 mmol/L   CO2 21 (L) 22 - 32 mmol/L   Glucose, Bld 117 (H) 65  - 99 mg/dL   BUN 45 (H) 6 - 20 mg/dL   Creatinine, Ser 1.64 (H) 0.61 - 1.24 mg/dL   Calcium 9.3 8.9 - 10.3 mg/dL   Total Protein 8.2 (H) 6.5 - 8.1 g/dL   Albumin 4.1 3.5 - 5.0 g/dL   AST 47 (H) 15 - 41 U/L   ALT 30 17 - 63 U/L   Alkaline Phosphatase 56 38 - 126 U/L   Total Bilirubin 1.5 (H) 0.3 - 1.2 mg/dL   GFR calc non Af Amer 42 (L) >60 mL/min   GFR calc Af Amer 48 (L) >60 mL/min    Comment: (NOTE) The eGFR has been calculated using the CKD EPI equation. This calculation has not been validated in all clinical situations. eGFR's persistently <60 mL/min signify possible Chronic Kidney Disease.    Anion gap 12 5 - 15  CBC with Differential     Status: Abnormal   Collection Time: 08/22/17  4:12 PM  Result Value Ref Range   WBC 15.4 (H) 4.0 - 10.5 K/uL   RBC 5.45 4.22 - 5.81 MIL/uL   Hemoglobin 17.2 (H) 13.0 - 17.0 g/dL   HCT 50.6 39.0 - 52.0 %   MCV 92.8 78.0 - 100.0 fL   MCH 31.6 26.0 - 34.0 pg   MCHC 34.0 30.0 - 36.0 g/dL   RDW 13.4 11.5 - 15.5 %   Platelets 185 150 - 400 K/uL   Neutrophils Relative % 86 %   Neutro Abs 13.1 (H) 1.7 - 7.7 K/uL   Lymphocytes Relative 8 %   Lymphs Abs 1.2 0.7 - 4.0 K/uL   Monocytes Relative 6 %   Monocytes Absolute 1.0 0.1 - 1.0 K/uL   Eosinophils Relative 0 %   Eosinophils Absolute 0.0 0.0 - 0.7 K/uL   Basophils Relative 0 %   Basophils Absolute 0.0 0.0 - 0.1 K/uL  Protime-INR     Status: None   Collection Time: 08/22/17  4:12 PM  Result Value Ref Range   Prothrombin Time 14.0 11.4 - 15.2 seconds   INR 1.09   Ethanol     Status: None   Collection Time: 08/22/17  4:12 PM  Result Value Ref Range   Alcohol, Ethyl (B) <5 <5 mg/dL    Comment:        LOWEST DETECTABLE LIMIT FOR SERUM ALCOHOL IS 5 mg/dL FOR MEDICAL PURPOSES ONLY   Salicylate level     Status: None   Collection Time: 08/22/17  4:12 PM  Result Value Ref Range   Salicylate Lvl <0.6 2.8 - 30.0 mg/dL  Acetaminophen level     Status: Abnormal   Collection Time: 08/22/17   4:12 PM  Result Value Ref Range   Acetaminophen (Tylenol), Serum <10 (L) 10 - 30 ug/mL    Comment:        THERAPEUTIC CONCENTRATIONS VARY SIGNIFICANTLY. A RANGE OF 10-30 ug/mL MAY BE AN EFFECTIVE CONCENTRATION FOR MANY PATIENTS. HOWEVER, SOME ARE BEST TREATED AT CONCENTRATIONS OUTSIDE THIS RANGE. ACETAMINOPHEN CONCENTRATIONS >150  ug/mL AT 4 HOURS AFTER INGESTION AND >50 ug/mL AT 12 HOURS AFTER INGESTION ARE OFTEN ASSOCIATED WITH TOXIC REACTIONS.   I-Stat Troponin, ED (not at [facility])     Status: None   Collection Time: 08/22/17  4:18 PM  Result Value Ref Range   Troponin i, poc 0.02 0.00 - 0.08 ng/mL   Comment 3            Comment: Due to the release kinetics of cTnI, a negative result within the first hours of the onset of symptoms does not rule out myocardial infarction with certainty. If myocardial infarction is still suspected, repeat the test at appropriate intervals.   I-Stat CG4 Lactic Acid, ED  (not at  [facility])     Status: None   Collection Time: 08/22/17  4:20 PM  Result Value Ref Range   Lactic Acid, Venous 1.60 0.5 - 1.9 mmol/L  Urinalysis, Routine w reflex microscopic     Status: Abnormal   Collection Time: 08/22/17  4:24 PM  Result Value Ref Range   Color, Urine YELLOW YELLOW   APPearance CLEAR CLEAR   Specific Gravity, Urine 1.027 1.005 - 1.030   pH 5.0 5.0 - 8.0   Glucose, UA NEGATIVE NEGATIVE mg/dL   Hgb urine dipstick MODERATE (A) NEGATIVE   Bilirubin Urine NEGATIVE NEGATIVE   Ketones, ur 5 (A) NEGATIVE mg/dL   Protein, ur 100 (A) NEGATIVE mg/dL   Nitrite NEGATIVE NEGATIVE   Leukocytes, UA NEGATIVE NEGATIVE   RBC / HPF 0-5 0 - 5 RBC/hpf   WBC, UA 0-5 0 - 5 WBC/hpf   Bacteria, UA MANY (A) NONE SEEN   Squamous Epithelial / LPF NONE SEEN NONE SEEN   Renal Epithelial 6    Mucus PRESENT   Rapid urine drug screen (hospital performed)     Status: None   Collection Time: 08/22/17  4:24 PM  Result Value Ref Range   Opiates NONE DETECTED NONE DETECTED    Cocaine NONE DETECTED NONE DETECTED   Benzodiazepines NONE DETECTED NONE DETECTED   Amphetamines NONE DETECTED NONE DETECTED   Tetrahydrocannabinol NONE DETECTED NONE DETECTED   Barbiturates NONE DETECTED NONE DETECTED    Comment:        DRUG SCREEN FOR MEDICAL PURPOSES ONLY.  IF CONFIRMATION IS NEEDED FOR ANY PURPOSE, NOTIFY LAB WITHIN 5 DAYS.        LOWEST DETECTABLE LIMITS FOR URINE DRUG SCREEN Drug Class       Cutoff (ng/mL) Amphetamine      1000 Barbiturate      200 Benzodiazepine   889 Tricyclics       169 Opiates          300 Cocaine          300 THC              50   I-stat chem 8, ed     Status: Abnormal   Collection Time: 08/22/17  4:24 PM  Result Value Ref Range   Sodium 149 (H) 135 - 145 mmol/L   Potassium 4.5 3.5 - 5.1 mmol/L   Chloride 118 (H) 101 - 111 mmol/L   BUN 46 (H) 6 - 20 mg/dL   Creatinine, Ser 1.70 (H) 0.61 - 1.24 mg/dL   Glucose, Bld 118 (H) 65 - 99 mg/dL   Calcium, Ion 1.00 (L) 1.15 - 1.40 mmol/L   TCO2 22 22 - 32 mmol/L   Hemoglobin 17.7 (H) 13.0 - 17.0 g/dL   HCT 52.0 39.0 -  52.0 %  Blood Culture (routine x 2)     Status: None (Preliminary result)   Collection Time: 08/22/17  4:40 PM  Result Value Ref Range   Specimen Description RIGHT ANTECUBITAL    Special Requests      BOTTLES DRAWN AEROBIC AND ANAEROBIC Blood Culture adequate volume   Culture NO GROWTH < 24 HOURS    Report Status PENDING   Ammonia     Status: None   Collection Time: 08/22/17  4:43 PM  Result Value Ref Range   Ammonia 23 9 - 35 umol/L  Blood Culture (routine x 2)     Status: None (Preliminary result)   Collection Time: 08/22/17  4:44 PM  Result Value Ref Range   Specimen Description BLOOD LEFT HAND    Special Requests      BOTTLES DRAWN AEROBIC AND ANAEROBIC Blood Culture adequate volume   Culture NO GROWTH < 24 HOURS    Report Status PENDING   CK     Status: Abnormal   Collection Time: 08/22/17  4:50 PM  Result Value Ref Range   Total CK 709 (H) 49 - 397 U/L    Blood gas, venous ([facility], [facility], [facility])     Status: Abnormal   Collection Time: 08/22/17  5:20 PM  Result Value Ref Range   FIO2 0.21    O2 Content 21.0 L/min   Delivery systems ROOM AIR    pH, Ven 7.448 (H) 7.250 - 7.430   pCO2, Ven 32.2 (L) 44.0 - 60.0 mmHg   pO2, Ven 197.0 (H) 32.0 - 45.0 mmHg   Bicarbonate 23.9 20.0 - 28.0 mmol/L   Acid-base deficit 1.5 0.0 - 2.0 mmol/L   O2 Saturation 99.1 %   Collection site NOT INDICATED    Drawn by DRAWN BY RN    Sample type VENOUS   Troponin I (q 6hr x 3)     Status: None   Collection Time: 08/22/17  9:55 PM  Result Value Ref Range   Troponin I <0.03 <0.03 ng/mL  MRSA PCR Screening     Status: None   Collection Time: 08/23/17  1:59 AM  Result Value Ref Range   MRSA by PCR NEGATIVE NEGATIVE    Comment:        The GeneXpert MRSA Assay (FDA approved for NASAL specimens only), is one component of a comprehensive MRSA colonization surveillance program. It is not intended to diagnose MRSA infection nor to guide or monitor treatment for MRSA infections.   Basic metabolic panel     Status: Abnormal   Collection Time: 08/23/17  4:25 AM  Result Value Ref Range   Sodium 150 (H) 135 - 145 mmol/L   Potassium 4.1 3.5 - 5.1 mmol/L   Chloride 119 (H) 101 - 111 mmol/L   CO2 24 22 - 32 mmol/L   Glucose, Bld 106 (H) 65 - 99 mg/dL   BUN 37 (H) 6 - 20 mg/dL   Creatinine, Ser 1.43 (H) 0.61 - 1.24 mg/dL   Calcium 8.1 (L) 8.9 - 10.3 mg/dL   GFR calc non Af Amer 49 (L) >60 mL/min   GFR calc Af Amer 57 (L) >60 mL/min    Comment: (NOTE) The eGFR has been calculated using the CKD EPI equation. This calculation has not been validated in all clinical situations. eGFR's persistently <60 mL/min signify possible Chronic Kidney Disease.    Anion gap 7 5 - 15  CBC     Status: Abnormal   Collection Time: 08/23/17  4:25 AM  Result Value Ref Range   WBC 14.6 (H) 4.0 - 10.5 K/uL    Comment: WHITE COUNT CONFIRMED ON SMEAR   RBC 4.82 4.22 - 5.81 MIL/uL    Hemoglobin 15.2 13.0 - 17.0 g/dL   HCT 46.4 39.0 - 52.0 %   MCV 96.3 78.0 - 100.0 fL   MCH 31.5 26.0 - 34.0 pg   MCHC 32.8 30.0 - 36.0 g/dL   RDW 13.6 11.5 - 15.5 %   Platelets 133 (L) 150 - 400 K/uL  Troponin I (q 6hr x 3)     Status: None   Collection Time: 08/23/17  4:25 AM  Result Value Ref Range   Troponin I <0.03 <0.03 ng/mL  Lipid panel     Status: None   Collection Time: 08/23/17  4:25 AM  Result Value Ref Range   Cholesterol 149 0 - 200 mg/dL   Triglycerides 102 <150 mg/dL   HDL 51 >40 mg/dL   Total CHOL/HDL Ratio 2.9 RATIO   VLDL 20 0 - 40 mg/dL   LDL Cholesterol 78 0 - 99 mg/dL    Comment:        Total Cholesterol/HDL:CHD Risk Coronary Heart Disease Risk Table                     Men   Women  1/2 Average Risk   3.4   3.3  Average Risk       5.0   4.4  2 X Average Risk   9.6   7.1  3 X Average Risk  23.4   11.0        Use the calculated Patient Ratio above and the CHD Risk Table to determine the patient's CHD Risk.        ATP III CLASSIFICATION (LDL):  <100     mg/dL   Optimal  100-129  mg/dL   Near or Above                    Optimal  130-159  mg/dL   Borderline  160-189  mg/dL   High  >190     mg/dL   Very High     Studies/Results:     Evagelia Knack A. Merlene Laughter, M.D.  Diplomate, Tax adviser of Psychiatry and Neurology ( Neurology). 08/23/2017, 10:14 AM

## 2017-08-24 ENCOUNTER — Inpatient Hospital Stay (HOSPITAL_COMMUNITY): Payer: Medicare Other

## 2017-08-24 DIAGNOSIS — E870 Hyperosmolality and hypernatremia: Secondary | ICD-10-CM | POA: Diagnosis present

## 2017-08-24 DIAGNOSIS — I639 Cerebral infarction, unspecified: Secondary | ICD-10-CM

## 2017-08-24 DIAGNOSIS — R001 Bradycardia, unspecified: Secondary | ICD-10-CM

## 2017-08-24 LAB — BASIC METABOLIC PANEL
ANION GAP: 6 (ref 5–15)
ANION GAP: 9 (ref 5–15)
BUN: 25 mg/dL — ABNORMAL HIGH (ref 6–20)
BUN: 25 mg/dL — ABNORMAL HIGH (ref 6–20)
CHLORIDE: 116 mmol/L — AB (ref 101–111)
CO2: 25 mmol/L (ref 22–32)
CO2: 24 mmol/L (ref 22–32)
Calcium: 8.4 mg/dL — ABNORMAL LOW (ref 8.9–10.3)
Calcium: 8.5 mg/dL — ABNORMAL LOW (ref 8.9–10.3)
Chloride: 114 mmol/L — ABNORMAL HIGH (ref 101–111)
Creatinine, Ser: 1.17 mg/dL (ref 0.61–1.24)
Creatinine, Ser: 1.2 mg/dL (ref 0.61–1.24)
GFR calc Af Amer: >60 mL/min (ref >60)
GFR calc Af Amer: >60 mL/min (ref >60)
GLUCOSE: 84 mg/dL (ref 65–99)
Glucose, Bld: 84 mg/dL (ref 65–99)
POTASSIUM: 3.7 mmol/L (ref 3.5–5.1)
POTASSIUM: 3.9 mmol/L (ref 3.5–5.1)
SODIUM: 147 mmol/L — AB (ref 135–145)
SODIUM: 147 mmol/L — AB (ref 135–145)

## 2017-08-24 LAB — CBC
HEMATOCRIT: 41.4 % (ref 39.0–52.0)
HEMOGLOBIN: 13.5 g/dL (ref 13.0–17.0)
MCH: 30.5 pg (ref 26.0–34.0)
MCHC: 32.6 g/dL (ref 30.0–36.0)
MCV: 93.5 fL (ref 78.0–100.0)
Platelets: 134 10*3/uL — ABNORMAL LOW (ref 150–400)
RBC: 4.43 MIL/uL (ref 4.22–5.81)
RDW: 13.4 % (ref 11.5–15.5)
WBC: 10.9 10*3/uL — AB (ref 4.0–10.5)

## 2017-08-24 LAB — GLUCOSE, CAPILLARY: GLUCOSE-CAPILLARY: 79 mg/dL (ref 65–99)

## 2017-08-24 MED ORDER — SODIUM CHLORIDE 0.9 % IV SOLN
INTRAVENOUS | Status: DC
  Administered 2017-08-24 – 2017-08-25 (×2): via INTRAVENOUS

## 2017-08-24 MED ORDER — ATROPINE SULFATE 1 MG/10ML IJ SOSY
PREFILLED_SYRINGE | INTRAMUSCULAR | Status: AC
  Filled 2017-08-24: qty 10

## 2017-08-24 MED ORDER — RESOURCE THICKENUP CLEAR PO POWD
ORAL | Status: DC | PRN
  Filled 2017-08-24 (×2): qty 125

## 2017-08-24 NOTE — Consult Note (Addendum)
Neurology Consultation Reason for Consult: Stroke Referring Physician: Jeanella AntonAbrol, N  CC: Stroke  History is obtained from: Chart review  HPI: [name] [name] is a 66 y.o. male was last seen well on 9/2 [facility]4 with subacute MCA infarct. He had an MRI which reveals fairly large MCA infarct, and therefore he was [facility] out of concern for swelling.  He is currently aphasic with right-sided weakness.  LKW: 9/2 tpa given?: no, outside of window Intervention? No, outside of window  ROS:   Unable to obtain due to altered mental status.   Past medical history: Unable to obtain due to altered mental status   Family history: Unable to obtain due to altered mental status.  Social History:  reports that he has never smoked. He has never used smokeless tobacco. His alcohol and drug histories are not on file.   Exam: Current vital signs: BP (!) 124/59 (BP Location: Left Arm)   Pulse (!) 42   Temp 98.2 F (36.8 C) (Oral)   Resp 18   Ht 6\' 1"  (1.854 m)   Wt 80.9 kg (178 lb 5.6 oz)   SpO2 97%   BMI 23.53 kg/m  Vital signs in last 24 hours: Temp:  [97.7 F (36.5 C)-98.4 F (36.9 C)] 98.2 F (36.8 C) (09/05 2300) Pulse Rate:  [36-46] 42 (09/05 2300) Resp:  [0-20] 18 (09/05 2300) BP: (101-141)/(59-106) 124/59 (09/05 2300) SpO2:  [91 %-99 %] 97 % (09/05 2300) Weight:  [78.5 kg (173 lb 1 oz)-80.9 kg (178 lb 5.6 oz)] 80.9 kg (178 lb 5.6 oz) (09/05 2200)   Physical Exam  Constitutional: Appears well-developed and well-nourished.  Psych: Affect appropriate to situation Eyes: No scleral injection HENT: No OP obstrucion Head: Normocephalic.  Cardiovascular: Normal rate and regular rhythm.  Respiratory: Effort normal and breath sounds normal to anterior ascultation GI: Soft.  No distension. There is no tenderness.  Skin: WDI  Neuro: Mental Status: Patient is awake, alert, aphasic, but he is able to show 2 fingers to command on his left hand,  also closes his eyes to command. He is completely mute Cranial Nerves: II: Blinks to threat bilaterally. Pupils are equal, round, and reactive to light.   III,IV, VI: He has a left gaze preference V: Facial sensation is diminished on the right VII: Facial movement is weak on the right VIII, X, XI, XII: Unable to assess secondary to patient's altered mental status.  Motor: He has a dense right hemiparesis with little movement of the right arm and minimal flexion of the right leg. He moves both his left arm and leg well Sensory: Sensation is decreased in the right side Cerebellar: Does not perform    I have reviewed labs in epic and the results pertinent to this consultation are: BMP-hypernatremia at 147  I have reviewed the images obtained: MRI brain-large left MCA infarct, but sparing some of the territory.  Impression: 66 year old male with large left MCA infarct. My suspicion is that he will not have swelling such that he will need intervention(e.g. 3% saline), and his mental status right now is good. I suspect that this is embolic. Though there is no significant stenosis, there is a possible ulcerated plaque in the left ICA.   Recommendations: 1) frequent neuro checks 2) likely will need TEE 3) continue telemetry 4) avoid hypotonic fluids 5) continue ASA 6) PT, OT, ST 7) stroke team to follow   Ritta SlotMcNeill Amarissa Koerner, MD [facility] [phone]  If  7pm- 7am, please page neurology on call as listed in [place].

## 2017-08-24 NOTE — Progress Notes (Signed)
PT Cancellation Note  Patient Details Name: [name] [name] MRN: [id number] DOB: 09/25/1951   Cancelled Treatment:    Reason Eval/Treat Not Completed: Medical issues which prohibited therapy Pt on bedrest. Will await increase in activity orders prior to PT evaluation.   Blake DivineShauna A Delinda Malan 08/24/2017, 8:26 AM  Mylo RedShauna Tekeyah Santiago, PT, DPT [phone]

## 2017-08-24 NOTE — Consult Note (Signed)
Cardiology Consultation:   Patient ID: [name] [name] [id number]; 1951-05-11   Admit date: 08/22/2017 Date of Consult: 08/24/2017  Primary Care Provider: Patient, No Pcp Per Primary Cardiologist: n/a Primary Electrophysiologist:  n/a   Patient Profile:   [name] [name] is a 66 y.o. male with a hx of no medical problems who is being seen today for the evaluation of bradycardia at the request of Dr. Catha Gosselin.  History of Present Illness:   [name] [name] presented to the ER at [facility] on 9/4 by EMS after being found on the ground, flaccid to the right side and laying in his own urine at his home. A family member went to check on him after not hearing from him for two days and found him this way. His lives with his elderly mother who he has been caring after she had a stroke. ER evaluation revealed a large subacute Left MCA CVA with 5 mm midline shift and hew as noted to be bradycardic down into the 30s.  Per chart review: His brother and sister have been interviewed. 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 He does not see doctors,  take any medications or have any known allergies. He was transferred by [facility] yesterday because of concern for brain swelling.   He has been having persistent bradycardia during this admission, rates to the 40s. Otherwise he has been hemodynamically stable. He has dense hemiplegia to the entire right side and is nonverbal. He is awake and alert and expresses yes or no with head movements. Pt denies having any prodrome before the incident. He nodded yes that he had felt fine leading up to this event. He shook his head no to CP, SOB, palpitations or hx of any heart problems.  HPI limited due to pt being nonverbal   History reviewed. No pertinent past medical history.  History reviewed. No pertinent surgical history.   Inpatient Medications: Scheduled  Meds: . aspirin  300 mg Rectal Daily  . chlorhexidine  15 mL Mouth Rinse BID  . enoxaparin (LOVENOX) injection  40 mg Subcutaneous Q24H  . mouth rinse  15 mL Mouth Rinse q12n4p   Continuous Infusions: . sodium chloride 100 mL/hr at 08/24/17 0505   PRN Meds: RESOURCE THICKENUP CLEAR, senna-docusate  Allergies:   No Known Allergies  Social History:   Social History   Social History  . Marital status: Single    Spouse name: N/A  . Number of children: N/A  . Years of education: N/A   Occupational History  . Not on file.   Social History Main Topics  . Smoking status: Never Smoker  . Smokeless tobacco: Never Used  . Alcohol use Not on file  . Drug use: Unknown  . Sexual activity: Not on file   Other Topics Concern  . Not on file   Social History Narrative  . No narrative on file    Family History:   Unable to obtain family hx as pt is nonverbal.  ROS:  Please see the history of present illness.  ROS  Unable to obtain ROS as pt is nonverbal    Physical Exam/Data:   Vitals:   08/23/17 2300 08/24/17 0415 08/24/17 0802 08/24/17 1141  BP: (!) 124/59 (!) 128/55 132/62   Pulse: (!) 42 (!) 37    Resp: 18 10    Temp: 98.2 F (36.8 C) 97.9 F (36.6 C) (!)  97.1 F (36.2 C) 98.1 F (36.7 C)  TempSrc: Oral Oral Axillary Oral  SpO2: 97% 100%    Weight:      Height:        Intake/Output Summary (Last 24 hours) at 08/24/17 1540 Last data filed at 08/24/17 1400  Gross per 24 hour  Intake          2516.67 ml  Output              650 ml  Net          1866.67 ml   Filed Weights   08/23/17 0210 08/23/17 0500 08/23/17 2200  Weight: 173 lb 1 oz (78.5 kg) 173 lb 1 oz (78.5 kg) 178 lb 5.6 oz (80.9 kg)   Body mass index is 23.53 kg/m.  General:  Well nourished, well developed, severe weakness to right side. HEENT: right side facial weakness. Lymph: no adenopathy Neck: no JVD Endocrine:  No thryomegaly Vascular: No carotid bruits; FA pulses 2+ bilaterally without  bruits  Cardiac:  normal S1, S2; no murmur, bradycardia Lungs:  clear to auscultation bilaterally, no wheezing, rhonchi or rales  Abd: soft, nontender, no hepatomegaly  Ext: no edema Musculoskeletal:  No deformities, marked right side weakness Skin: warm and dry  Neuro:  Hemiplegia.  EKG:  The EKG was personally reviewed and demonstrates:  Sinus rhythm, minimal ST depression in anterolateral leads. Telemetry:  Telemetry was personally reviewed and demonstrates:  Bradycardia, persistent 35-45 bpm  Relevant CV Studies: ECHOCARDIOGRAM 08/23/2017 Study Conclusions  - Left ventricle: The cavity size was normal. Wall thickness was   normal. Systolic function was normal. The estimated ejection   fraction was 55%. Wall motion was normal; there were no regional   wall motion abnormalities. Left ventricular diastolic function   parameters were normal. - Aortic valve: Trileaflet; mildly thickened leaflets. There was   trivial regurgitation. - Aorta: Mild aortic root dilatation. Aortic root dimension: 39 mm   (ED).  - Mitral valve: There was mild regurgitation. - Left atrium: The atrium was mildly dilated. - Atrial septum: No defect or patent foramen ovale was identified. - Pulmonic valve: There was mild regurgitation.  Laboratory Data:  Chemistry Recent Labs Lab 08/23/17 1007 08/24/17 0102 08/24/17 0647  NA 147* 147* 147*  K 4.2 3.7 3.9  CL 117* 116* 114*  CO2 GLUCOSE 99 84 84  BUN 34* 25* 25*  CREATININE 1.24 1.17 1.20  CALCIUM 8.5* 8.4* 8.5*  GFRNONAA 58* >60 >60  GFRAA >60 >60 >60  ANIONGAP Recent Labs Lab 08/22/17 1612 08/23/17 1007  PROT 8.2* 6.4*  ALBUMIN 4.1 3.1*  AST 47* 33  ALT 30 26  ALKPHOS 56 44  BILITOT 1.5* 1.3*   Hematology Recent Labs Lab 08/22/17 1612 08/22/17 1624 08/23/17 0425 08/24/17 0102  WBC 15.4*  --  14.6* 10.9*  RBC 5.45  --  4.82 4.43  HGB 17.2* 17.7* 15.2 13.5  HCT 50.6 52.0 46.4 41.4  MCV 92.8  --  96.3 93.5   MCH 31.6  --  31.5 30.5  MCHC 34.0  --  32.8 32.6  RDW 13.4  --  13.6 13.4  PLT 185  --  133* 134*   Cardiac Enzymes Recent Labs Lab 08/22/17 2155 08/23/17 0425 08/23/17 1007  TROPONINI <0.03 <0.03 <0.03    Recent Labs Lab 08/22/17 1618  TROPIPOC 0.02    BNPNo results for input(s): BNP, PROBNP in the last 168  hours.  DDimer No results for input(s): DDIMER in the last 168 hours.  Radiology/Studies:  Dg Chest 2 View  Result Date: 08/22/2017 CLINICAL DATA:  Initial evaluation for acute altered mental status. EXAM: CHEST  2 VIEW COMPARISON:  None. FINDINGS: Cardiac and mediastinal silhouettes are within normal limits. Lungs hypoinflated. No focal infiltrates. No pulmonary edema or pleural effusion. No pneumothorax. No acute osseus abnormality. IMPRESSION: No active cardiopulmonary disease. Electronically Signed   By: Rise MuBenjamin  McClintock M.D.   On: 08/22/2017 18:19   Ct Head Wo Contrast  Result Date: 08/22/2017 CLINICAL DATA:  RIGHT-sided weakness. Altered level of consciousness. Found fall on floor in urine. EXAM: CT HEAD WITHOUT CONTRAST TECHNIQUE: Contiguous axial images were obtained from the base of the skull through the vertex without intravenous contrast. COMPARISON:  None. FINDINGS: BRAIN: Blurring of the LEFT frontotemporal parietal gray-white matter junction with wedge-like hypodensity extending into the LEFT basal ganglia, involving the LEFT insula. Punctate densities LEFT basal ganglia. Regional mass effect with partially effaced LEFT lateral ventricle, no hydrocephalus. 3 mm LEFT-to-RIGHT midline shift. No intraparenchymal hemorrhage. No abnormal extra-axial fluid collections. Basal cisterns are patent. VASCULAR: Dense LEFT MCA, potentially overestimated by surrounding parenchymal hypodensity. Mild calcific atherosclerosis carotid siphons. SKULL: No skull fracture. No significant scalp soft tissue swelling. 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 SINUSES/ORBITS: The  mastoid air-cells and included paranasal sinuses are well-aerated. Soft tissue within the external auditory canals most compatible with cerumen. The included ocular globes and orbital contents are non-suspicious. OTHER: None. IMPRESSION: 1. Acute large LEFT MCA territory infarct with LEFT basal ganglia suspected petechial hemorrhage. 3 mm LEFT-to-RIGHT midline shift without ventricular entrapment or hydrocephalus. 2. Critical Value/emergent results were called by telephone at the time of interpretation on 08/22/2017 at 5:57 pm to Dr. Marily MemosJASON MESNER , who verbally acknowledged these results. Electronically Signed   By: Awilda Metroourtnay  Bloomer M.D.   On: 08/22/2017 17:59   [name] Brain Wo Contrast  Result Date: 08/23/2017 CLINICAL DATA:  Unresponsive.  Abnormal head CT. EXAM: MRI HEAD WITHOUT CONTRAST MRA HEAD WITHOUT CONTRAST TECHNIQUE: Multiplanar, multiecho pulse sequences of the brain and surrounding structures were obtained without intravenous contrast. Angiographic images of the head were obtained using MRA technique without contrast. COMPARISON:  Head CT from yesterday FINDINGS: MRI HEAD FINDINGS Brain: Large area of acute infarct in the left MCA territory affecting the insula, frontotemporal operculum, inferolateral frontal lobe, and posterior temporal/parietal cortex. Infarcted striatum and anterior limb internal capsule with basal ganglia petechial hemorrhage. Cytotoxic edema causes up to 5 mm of midline shift. Probable small remote left inferior cerebellar infarct. No hydrocephalus or masslike findings. Vascular: Arterial findings below. Normal dural venous sinus flow voids. Skull and upper cervical spine: Negative for marrow lesion Sinuses/Orbits: No acute finding. MRA HEAD FINDINGS Symmetric carotid arteries that are patent. Bilateral P1 hypoplasia. Flow gap at the distal left M1 segment with under opacification of the less numerous left MCA branches. Accounting for artifact, no stenosis or branch occlusion seen  within bilateral ACA and right MCA vessels. Small vertebrobasilar vessels in the setting of fetal type PCAs. There is asymmetric poor signal within the right PCA beginning at the P2 segment, where there may be an underlying severe stenosis. No aneurysm findings. IMPRESSION: Brain MRI: 1. Large acute left MCA territory infarct most confluent along the insula, opercula, and basal ganglia. No evidence of infarct progression compared to head CT yesterday. 2. Petechial hemorrhage is seen within the left striatum. 3. Cytotoxic edema causes 5 mm of midline shift. Intracranial  MRA: 1. Distal left M1 apparent occlusion with flow gap. 2. Poor flow in the right PCA beginning at the P2 segment, presumably from underlying advanced stenosis. Electronically Signed   By: Marnee Spring M.D.   On: 08/23/2017 10:22   US Carotid Bilateral (at [facility] Only)  Result Date: 08/23/2017 CLINICAL DATA:  66 year old male with a history of altered mental status. Cardiovascular risk factors include stroke/TIA EXAM: BILATERAL CAROTID DUPLEX ULTRASOUND TECHNIQUE: Wallace Cullens scale imaging, color Doppler and duplex ultrasound were performed of bilateral carotid and vertebral arteries in the neck. COMPARISON:  No prior duplex FINDINGS: Criteria: Quantification of carotid stenosis is based on velocity parameters that correlate the residual internal carotid diameter with NASCET-based stenosis levels, using the diameter of the distal internal carotid lumen as the denominator for stenosis measurement. The following velocity measurements were obtained: RIGHT ICA:  Systolic 72 cm/sec, Diastolic 28 cm/sec CCA:  91 cm/sec SYSTOLIC ICA/CCA RATIO:  0.8 ECA:  80 cm/sec LEFT ICA:  Systolic 62 cm/sec, Diastolic 15 cm/sec CCA:  90 cm/sec SYSTOLIC ICA/CCA RATIO:  0.7 ECA:  77 cm/sec Right Brachial SBP: Not acquired Left Brachial SBP: Not acquired RIGHT CAROTID ARTERY: No significant calcifications of the right common carotid artery. Intermediate waveform  maintained. Heterogeneous and partially calcified plaque at the right carotid bifurcation. No significant lumen shadowing. Low resistance waveform of the right ICA. Mild tortuosity RIGHT VERTEBRAL ARTERY: Antegrade flow with low resistance waveform. LEFT CAROTID ARTERY: No significant calcifications of the left common carotid artery. Intermediate waveform maintained. Heterogeneous and partially calcified plaque at the left carotid bifurcation without significant lumen shadowing. Low resistance waveform of the left ICA. Duplex demonstrates color signal within the plaque of the far field wall at the bifurcation. Mild tortuosity LEFT VERTEBRAL ARTERY:  Antegrade flow with low resistance waveform. IMPRESSION: Right: Color duplex indicates minimal heterogeneous and calcified plaque, with no hemodynamically significant stenosis by duplex criteria in the extracranial cerebrovascular circulation. Left: Color duplex indicates minimal heterogeneous and calcified plaque, with no hemodynamically significant stenosis by duplex criteria in the extracranial cerebrovascular circulation. Color duplex demonstrates changes suggesting ulcerated plaque at the carotid bifurcation. Signed, Yvone Neu. Loreta Ave, DO [facility] Radiology Electronically Signed   By: Gilmer Mor D.O.   On: 08/23/2017 14:26   Dg Swallowing Func-speech Pathology  Result Date: 08/24/2017 Objective Swallowing Evaluation: Type of Study: MBS-Modified Barium Swallow Study Patient Details Name: [name] [name] MRN: [id number] Date of Birth: [birth year] Today's Date: 08/24/2017 Time: SLP Start Time (ACUTE ONLY): 1258-SLP Stop Time (ACUTE ONLY): 1316 SLP Time Calculation (min) (ACUTE ONLY): 18 min Past Medical History: No past medical history on file. Past Surgical History: No past surgical history on file. HPI: Ptis [facility] 66 [facility] a large subacute MCA infarct and 5 mm midline shift. He was  transferred to [facility] out of concern for swelling. No significant PMH is known. Subjective: pt alert but nonverbal Assessment / Plan / Recommendation CHL IP CLINICAL IMPRESSIONS 08/24/2017 Clinical Impression Pt has a moderate oral and mild pharyngeal dysphagia due to right-sided weakness and sensory deficits. 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 He also has disorganized bolus formation and slow transit, with R sided pocketing that is most prevalent with soft solids as almost the entire bolus remains in his buccal cavity until it is manually removed by SLP. His pharyngeal phase is grossly functional except  for mild base of tongue weakness that allows for some vallecular residue; however, his poor oral control also results in intermittent premature spillage that causes silent penetration to the true vocal folds with thin and nectar thick liquids. When he consumed nectar thick liquids by straw, his oral containment was improved, even with challenging. Recommend initiation of Dys 1 diet and nectar thick liquids by straw.  SLP Visit Diagnosis Dysphagia, oropharyngeal phase (R13.12) Attention and concentration deficit following -- Frontal lobe and executive function deficit following -- Impact on safety and function Mild aspiration risk;Moderate aspiration risk   CHL IP TREATMENT RECOMMENDATION 08/24/2017 Treatment Recommendations Therapy as outlined in treatment plan below   Prognosis 08/24/2017 Prognosis for Safe Diet Advancement Good Barriers to Reach Goals Language deficits Barriers/Prognosis Comment -- CHL IP DIET RECOMMENDATION 08/24/2017 SLP Diet Recommendations Dysphagia 1 (Puree) solids;Nectar thick liquid Liquid Administration via Straw only (NO cup sips) Medication Administration Crushed with puree Compensations Slow rate;Small sips/bites;Lingual sweep for clearance of pocketing;Monitor for anterior loss Postural Changes Seated  upright at 90 degrees   CHL IP OTHER RECOMMENDATIONS 08/24/2017 Recommended Consults -- Oral Care Recommendations Oral care BID Other Recommendations Have oral suction available;Order thickener from pharmacy;Prohibited food (jello, ice cream, thin soups);Remove water pitcher   CHL IP FOLLOW UP RECOMMENDATIONS 08/24/2017 Follow up Recommendations Inpatient Rehab   CHL IP FREQUENCY AND DURATION 08/24/2017 Speech Therapy Frequency (ACUTE ONLY) min 2x/week Treatment Duration 2 weeks      CHL IP ORAL PHASE 08/24/2017 Oral Phase Impaired Oral - Pudding Teaspoon -- Oral - Pudding Cup -- Oral - Honey Teaspoon -- Oral - Honey Cup -- Oral - Nectar Teaspoon -- Oral - Nectar Cup Right anterior bolus loss;Weak lingual manipulation;Reduced posterior propulsion;Delayed oral transit;Decreased bolus cohesion;Premature spillage Oral - Nectar Straw Weak lingual manipulation;Reduced posterior propulsion;Delayed oral transit;Decreased bolus cohesion;Premature spillage Oral - Thin Teaspoon -- Oral - Thin Cup Right anterior bolus loss;Weak lingual manipulation;Reduced posterior propulsion;Delayed oral transit;Decreased bolus cohesion;Premature spillage Oral - Thin Straw -- Oral - Puree Right anterior bolus loss;Weak lingual manipulation;Reduced posterior propulsion;Delayed oral transit;Decreased bolus cohesion;Right pocketing in lateral sulci Oral - Mech Soft Right anterior bolus loss;Weak lingual manipulation;Reduced posterior propulsion;Delayed oral transit;Decreased bolus cohesion;Premature spillage;Right pocketing in lateral sulci Oral - Regular -- Oral - Multi-Consistency -- Oral - Pill -- Oral Phase - Comment --  CHL IP PHARYNGEAL PHASE 08/24/2017 Pharyngeal Phase Impaired Pharyngeal- Pudding Teaspoon -- Pharyngeal -- Pharyngeal- Pudding Cup -- Pharyngeal -- Pharyngeal- Honey Teaspoon -- Pharyngeal -- Pharyngeal- Honey Cup -- Pharyngeal -- Pharyngeal- Nectar Teaspoon -- Pharyngeal -- Pharyngeal- Nectar Cup Reduced tongue base  retraction;Pharyngeal residue - valleculae;Penetration/Aspiration before swallow Pharyngeal Material enters airway, CONTACTS cords and not ejected out Pharyngeal- Nectar Straw Reduced tongue base retraction;Pharyngeal residue - valleculae Pharyngeal -- Pharyngeal- Thin Teaspoon -- Pharyngeal -- Pharyngeal- Thin Cup Reduced tongue base retraction;Pharyngeal residue - valleculae;Penetration/Aspiration before swallow Pharyngeal Material enters airway, CONTACTS cords and not ejected out Pharyngeal- Thin Straw -- Pharyngeal -- Pharyngeal- Puree Reduced tongue base retraction;Pharyngeal residue - valleculae Pharyngeal -- Pharyngeal- Mechanical Soft Reduced tongue base retraction;Pharyngeal residue - valleculae Pharyngeal -- Pharyngeal- Regular -- Pharyngeal -- Pharyngeal- Multi-consistency -- Pharyngeal -- Pharyngeal- Pill -- Pharyngeal -- Pharyngeal Comment --  CHL IP CERVICAL ESOPHAGEAL PHASE 08/24/2017 Cervical Esophageal Phase WFL Pudding Teaspoon -- Pudding Cup -- Honey Teaspoon -- Honey Cup -- Nectar Teaspoon -- Nectar Cup -- Nectar Straw -- Thin Teaspoon -- Thin Cup -- Thin Straw -- Puree -- Mechanical Soft -- Regular -- Multi-consistency -- Pill -- Cervical  Esophageal Comment -- No flowsheet data found. Maxcine Ham 08/24/2017, 2:49 PM  Maxcine Ham, M.A. CCC-SLP [phone]             [name] [name] Head/brain UJ Cm  Result Date: 08/23/2017 CLINICAL DATA:  Unresponsive.  Abnormal head CT. EXAM: MRI HEAD WITHOUT CONTRAST MRA HEAD WITHOUT CONTRAST TECHNIQUE: Multiplanar, multiecho pulse sequences of the brain and surrounding structures were obtained without intravenous contrast. Angiographic images of the head were obtained using MRA technique without contrast. COMPARISON:  Head CT from yesterday FINDINGS: MRI HEAD FINDINGS Brain: Large area of acute infarct in the left MCA territory affecting the insula, frontotemporal operculum, inferolateral frontal lobe, and posterior temporal/parietal cortex. Infarcted  striatum and anterior limb internal capsule with basal ganglia petechial hemorrhage. Cytotoxic edema causes up to 5 mm of midline shift. Probable small remote left inferior cerebellar infarct. No hydrocephalus or masslike findings. Vascular: Arterial findings below. Normal dural venous sinus flow voids. Skull and upper cervical spine: Negative for marrow lesion Sinuses/Orbits: No acute finding. MRA HEAD FINDINGS Symmetric carotid arteries that are patent. Bilateral P1 hypoplasia. Flow gap at the distal left M1 segment with under opacification of the less numerous left MCA branches. Accounting for artifact, no stenosis or branch occlusion seen within bilateral ACA and right MCA vessels. Small vertebrobasilar vessels in the setting of fetal type PCAs. There is asymmetric poor signal within the right PCA beginning at the P2 segment, where there may be an underlying severe stenosis. No aneurysm findings. IMPRESSION: Brain MRI: 1. Large acute left MCA territory infarct most confluent along the insula, opercula, and basal ganglia. No evidence of infarct progression compared to head CT yesterday. 2. Petechial hemorrhage is seen within the left striatum. 3. Cytotoxic edema causes 5 mm of midline shift. Intracranial MRA: 1. Distal left M1 apparent occlusion with flow gap. 2. Poor flow in the right PCA beginning at the P2 segment, presumably from underlying advanced stenosis. Electronically Signed   By: Marnee Spring M.D.   On: 08/23/2017 10:22    Assessment and Plan:    Acute L MCA CVA: Residual dense right hemiplegia. Neuro/stroke team/TRH has patient on aspirin 300 mg suppository and Lovenox for DVT prophylaxis.  Bradycardia: Echo performed yesterday, pt has a preserved EF with mild rergug to his mitral, aortic and pulmonic valves. Left atrium mildly dilated. Aortic root mildly dilated. Not on any medications at home. No BB here. HR consistently 30-40s. Negative Troponin x 3. No cardiomegaly or atherosclerosis  noted to chest xray. --   Carotid US suggests ulcerated plaque at the carotid bifurcation. --   Needs TEE --   Keep pacer pads at bedside  Signed, Dorthula Matas, PA-C  08/24/2017   History and all data above reviewed.  Patient examined.  I agree with the findings as above.   We are asked to see the patient for evaluation of bradycardia.  He has had a CVA possibly embolic without clear etiology.  There is some carotid plaque.  He is aphasic but is able to answer yes no with head shaking.  The patient denies any new symptoms such as chest discomfort, neck or arm discomfort. There has been no new shortness of breath, PND or orthopnea. There have been no reported palpitations, presyncope or syncope.   The patient exam reveals COR:RRR  ,  Lungs: Clear  ,  Abd: Positive bowel sounds, no rebound no guarding, Ext No edema  .  All available labs, radiology testing, previous records reviewed. Agree with documented assessment  and plan. Bradycardia:  Normal echo.  Likely related to increased vagal tone following CVA.  Rate increased with simple exercise.  Tele reviewed and no significant pauses.  He is hemodynamically stable.  Doubt he will need any intervention such as pacing.  We will follow.  CVA:  We will arrange TEE.  Likely early next week.   Fayrene Fearing Samie Barclift  4:40 PM  08/24/2017

## 2017-08-24 NOTE — Progress Notes (Signed)
Patient seen and examined today. No change. Please see progress note dictated by Dr. Lorretta HarpXilin Niu earlier today.   Patient transfer from [facility] for acute CVA. Patient was noted to have sinus bradycardia, have consulted cardiology. Discussed case with Dr. Pearlean BrownieSethi, requested TEE.   Time spent: 20 minutes  [name] [name] D.O. [facility] Pager [phone]  If 7PM-7AM, please contact night-coverage [email or web address] Password [email or web address] 08/24/2017, 3:13 PM

## 2017-08-24 NOTE — Progress Notes (Signed)
Per Dr. Clyde LundborgNiu, ok to have atropine and zoll pads at bedside.

## 2017-08-24 NOTE — Progress Notes (Signed)
Modified Barium Swallow Progress Note  Patient Details  Name: [name] [name] MRN: [id number] Date of Birth: 07/05/1951  Today's Date: 08/24/2017  Modified Barium Swallow completed.  Full report located under Chart Review in the Imaging Section.  Brief recommendations include the following:  Clinical Impression  Pt has a moderate oral and mild pharyngeal dysphagia due to right-sided weakness and sensory deficits. 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 He also has disorganized bolus formation and slow transit, with R sided pocketing that is most prevalent with soft solids as almost the entire bolus remains in his buccal cavity until it is manually removed by SLP. His pharyngeal phase is grossly functional except for mild base of tongue weakness that allows for some vallecular residue; however, his poor oral control also results in intermittent premature spillage that causes silent penetration to the true vocal folds with thin and nectar thick liquids. When he consumed nectar thick liquids by straw, his oral containment was improved, even with challenging. Recommend initiation of Dys 1 diet and nectar thick liquids by straw.    Swallow Evaluation Recommendations       SLP Diet Recommendations: Dysphagia 1 (Puree) solids;Nectar thick liquid   Liquid Administration via: Straw only (NO cup sips)   Medication Administration: Crushed with puree   Supervision: Patient able to self feed;Full supervision/cueing for compensatory strategies   Compensations: Slow rate;Small sips/bites;Lingual sweep for clearance of pocketing;Monitor for anterior loss   Postural Changes: Seated upright at 90 degrees   Oral Care Recommendations: Oral care BID   Other Recommendations: Have oral suction available;Order thickener from pharmacy;Prohibited food (jello, ice cream, thin soups);Remove water pitcher    [name],  Khyla Mccumbers 08/24/2017,2:49 PM   [name], M.A. CCC-SLP [phone]

## 2017-08-24 NOTE — Progress Notes (Signed)
Rehab Admissions Coordinator Note:  Patient was screened by Trish MageLogue, Bernadette Gores M for appropriateness for an Inpatient Acute Rehab Consult.  At this time, PT/OT evaluations are pending.  Will await PT/OT to determine functional levels and potential need for acute inpatient rehab consult.   Lelon FrohlichLogue, Elis Sauber M 08/24/2017, 11:58 AM  I can be reached at [phone].

## 2017-08-24 NOTE — Progress Notes (Addendum)
[facility] PROGRESS NOTE  [name] [name] OZH:[id number] DOB: [birth year] DOA: 08/22/2017 PCP: Patient, No Pcp Per  Assessment/Plan:  Acute cerebrovascular accident (CVA) [facility]): MRI of brain showed large acute left MCA territory infarct most confluent along the insula, opercula, and basal ganglia, with petechial hemorrhage within the left striatum, and cytotoxic edema causes 5 mm of midline shift. Neurology, Dr. Amada Jupiter was consulted. I discussed with PCCM, Dr. Deterding-->pt dose not need to be in ICU.  -will switch 1/2 to NS at rate 100 cc/h ( per Dr. Amada Jupiter, need to avoid hypotonic fluid) -continue aspirin per rectal -Frequent neuro check -pt/ot  AKI due to mild rhabdomyolysis: CK 780. Cre improved from 1.43-1.17 now -continue IVF as above  Hypernatremia: Na 147. will not treat with hypotonic fluid due to cytotoxic brain edema -f/u by BMP  Persistent bradycardia: most likely due to large left hemispheric infarct. Hr at 40s. Hemodynamically stable. -tele monitoring -Pacer pad at bed side   Code Status: full Family Communication: no family members at the bedside Disposition Plan: to be detemined   Consultants:  Neurology, Dr. Amada Jupiter  Procedures:  none  Antibiotics:  none  Brief description of her history:  [name] [name] is a 66 y.o. male , with no significant past medical history, who was admitted to [facility] on 9/4 with subacute MCA infarct. He had an MRI which reveals fairly large MCA infarct and 5 mm of midline shift. Pt has right-sided paralysis. He was transferred to [facility] out of concern for brain edema and swelling. When I saw pt on the floor. He is nonverbal. He seems not have any pain. No active respiratory distress, cough, nausea, vomiting or diarrhea noted.   Brain MRI: 1. Large acute left MCA territory infarct most confluent along the insula, opercula, and basal ganglia. No evidence of infarct progression compared to head CT  yesterday. 2. Petechial hemorrhage is seen within the left striatum. 3. Cytotoxic edema causes 5 mm of midline shift.  Intracranial MRA: 1. Distal left M1 apparent occlusion with flow gap. 2. Poor flow in the right PCA beginning at the P2 segment, presumably from underlying advanced stenosis.    Objective: Vitals:   08/23/17 2300 08/24/17 0415  BP: (!) 124/59 (!) 128/55  Pulse: (!) 42 (!) 37  Resp: 18 10  Temp: 98.2 F (36.8 C) 97.9 F (36.6 C)  SpO2: 97% 100%    Intake/Output Summary (Last 24 hours) at 08/24/17 0536 Last data filed at 08/24/17 0100  Gross per 24 hour  Intake          2039.58 ml  Output             1400 ml  Net           639.58 ml   Filed Weights   08/23/17 0210 08/23/17 0500 08/23/17 2200  Weight: 78.5 kg (173 lb 1 oz) 78.5 kg (173 lb 1 oz) 80.9 kg (178 lb 5.6 oz)    Exam:   Physical Exam:   Vitals:   08/23/17 1900 08/23/17 2200 08/23/17 2300 08/24/17 0415  BP: 126/71 137/60 (!) 124/59 (!) 128/55  Pulse: (!) 46 (!) 45 (!) 42 (!) 37  Resp: Temp: 98.4 F (36.9 C) 98 F (36.7 C) 98.2 F (36.8 C) 97.9 F (36.6 C)  TempSrc:  Oral Oral Oral  SpO2: 99% 97% 97% 100%  Weight:  80.9 kg (178 lb 5.6 oz)    Height:   (1.854 m)  General: Not in acute distress HEENT: PERRL, EOMI, no scleral icterus, No JVD or bruit Cardiac: S1/S2, RRR, No murmurs, gallops or rubs Pulm: Clear to auscultation bilaterally.  Abd: Soft, nondistended,no rebound pain, no organomegaly, BS present Ext: No edema. 2+DP/PT pulse bilaterally Musculoskeletal: No joint deformities, erythema, or stiffness, ROM full Skin: No rashes.  Neuro: Alert, nonverbal, Cranial nerves II-XII grossly intact. Right sided paralysis.  Data Reviewed: Basic Metabolic Panel:  Recent Labs Lab 08/22/17 1612 08/22/17 1624 08/23/17 0425 08/23/17 1007 08/24/17 0102  NA 146* 149* 150* 147* 147*  K 4.3 4.5 4.1 4.2 3.7  CL 113* 118* 119* 117* 116*  CO2 21*  --  GLUCOSE 117* 118* 106* 99 84  BUN 45* 46* 37* 34* 25*  CREATININE 1.64* 1.70* 1.43* 1.24 1.17  CALCIUM 9.3  --  8.1* 8.5* 8.4*   Liver Function Tests:  Recent Labs Lab 08/22/17 1612 08/23/17 1007  AST 47* 33  ALT 30 26  ALKPHOS 56 44  BILITOT 1.5* 1.3*  PROT 8.2* 6.4*  ALBUMIN 4.1 3.1*   No results for input(s): LIPASE, AMYLASE in the last 168 hours.  Recent Labs Lab 08/22/17 1643  AMMONIA 23   CBC:  Recent Labs Lab 08/22/17 1612 08/22/17 1624 08/23/17 0425 08/24/17 0102  WBC 15.4*  --  14.6* 10.9*  NEUTROABS 13.1*  --   --   --   HGB 17.2* 17.7* 15.2 13.5  HCT 50.6 52.0 46.4 41.4  MCV 92.8  --  96.3 93.5  PLT 185  --  133* 134*   Cardiac Enzymes:  Recent Labs Lab 08/22/17 1650 08/22/17 2155 08/23/17 0425 08/23/17 1007  CKTOTAL 709*  --   --  380  TROPONINI  --  <0.03 <0.03 <0.03   BNP (last 3 results) No results for input(s): BNP in the last 8760 hours.  ProBNP (last 3 results) No results for input(s): PROBNP in the last 8760 hours.  CBG:  Recent Labs Lab 08/23/17 2249 08/24/17 0353  GLUCAP 86 79    Recent Results (from the past 240 hour(s))  Blood Culture (routine x 2)     Status: None (Preliminary result)   Collection Time: 08/22/17  4:40 PM  Result Value Ref Range Status   Specimen Description RIGHT ANTECUBITAL  Final   Special Requests   Final    BOTTLES DRAWN AEROBIC AND ANAEROBIC Blood Culture adequate volume   Culture NO GROWTH 2 DAYS  Final   Report Status PENDING  Incomplete  Blood Culture (routine x 2)     Status: None (Preliminary result)   Collection Time: 08/22/17  4:44 PM  Result Value Ref Range Status   Specimen Description BLOOD LEFT HAND  Final   Special Requests   Final    BOTTLES DRAWN AEROBIC AND ANAEROBIC Blood Culture adequate volume   Culture NO GROWTH 2 DAYS  Final   Report Status PENDING  Incomplete  MRSA PCR Screening     Status: None   Collection Time: 08/23/17  1:59 AM  Result Value Ref Range Status    MRSA by PCR NEGATIVE NEGATIVE Final    Comment:        The GeneXpert MRSA Assay (FDA approved for NASAL specimens only), is one component of a comprehensive MRSA colonization surveillance program. It is not intended to diagnose MRSA infection nor to guide or monitor treatment for MRSA infections.      Studies: Dg Chest 2 View  Result Date: 08/22/2017 CLINICAL DATA:  Initial evaluation for acute altered mental status. EXAM: CHEST  2 VIEW COMPARISON:  None. FINDINGS: Cardiac and mediastinal silhouettes are within normal limits. Lungs hypoinflated. No focal infiltrates. No pulmonary edema or pleural effusion. No pneumothorax. No acute osseus abnormality. IMPRESSION: No active cardiopulmonary disease. Electronically Signed   By: Rise MuBenjamin  McClintock M.D.   On: 08/22/2017 18:19   Ct Head Wo Contrast  Result Date: 08/22/2017 CLINICAL DATA:  RIGHT-sided weakness. Altered level of consciousness. Found fall on floor in urine. EXAM: CT HEAD WITHOUT CONTRAST TECHNIQUE: Contiguous axial images were obtained from the base of the skull through the vertex without intravenous contrast. COMPARISON:  None. FINDINGS: BRAIN: Blurring of the LEFT frontotemporal parietal gray-white matter junction with wedge-like hypodensity extending into the LEFT basal ganglia, involving the LEFT insula. Punctate densities LEFT basal ganglia. Regional mass effect with partially effaced LEFT lateral ventricle, no hydrocephalus. 3 mm LEFT-to-RIGHT midline shift. No intraparenchymal hemorrhage. No abnormal extra-axial fluid collections. Basal cisterns are patent. VASCULAR: Dense LEFT MCA, potentially overestimated by surrounding parenchymal hypodensity. Mild calcific atherosclerosis carotid siphons. SKULL: No skull fracture. No significant scalp soft tissue swelling. 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 SINUSES/ORBITS: The mastoid air-cells and included paranasal sinuses are well-aerated. Soft tissue within the external  auditory canals most compatible with cerumen. The included ocular globes and orbital contents are non-suspicious. OTHER: None. IMPRESSION: 1. Acute large LEFT MCA territory infarct with LEFT basal ganglia suspected petechial hemorrhage. 3 mm LEFT-to-RIGHT midline shift without ventricular entrapment or hydrocephalus. 2. Critical Value/emergent results were called by telephone at the time of interpretation on 08/22/2017 at 5:57 pm to Dr. Marily MemosJASON MESNER , who verbally acknowledged these results. Electronically Signed   By: Awilda Metroourtnay  Bloomer M.D.   On: 08/22/2017 17:59   Mr Brain Wo Contrast  Result Date: 08/23/2017 CLINICAL DATA:  Unresponsive.  Abnormal head CT. EXAM: MRI HEAD WITHOUT CONTRAST MRA HEAD WITHOUT CONTRAST TECHNIQUE: Multiplanar, multiecho pulse sequences of the brain and surrounding structures were obtained without intravenous contrast. Angiographic images of the head were obtained using MRA technique without contrast. COMPARISON:  Head CT from yesterday FINDINGS: MRI HEAD FINDINGS Brain: Large area of acute infarct in the left MCA territory affecting the insula, frontotemporal operculum, inferolateral frontal lobe, and posterior temporal/parietal cortex. Infarcted striatum and anterior limb internal capsule with basal ganglia petechial hemorrhage. Cytotoxic edema causes up to 5 mm of midline shift. Probable small remote left inferior cerebellar infarct. No hydrocephalus or masslike findings. Vascular: Arterial findings below. Normal dural venous sinus flow voids. Skull and upper cervical spine: Negative for marrow lesion Sinuses/Orbits: No acute finding. MRA HEAD FINDINGS Symmetric carotid arteries that are patent. Bilateral P1 hypoplasia. Flow gap at the distal left M1 segment with under opacification of the less numerous left MCA branches. Accounting for artifact, no stenosis or branch occlusion seen within bilateral ACA and right MCA vessels. Small vertebrobasilar vessels in the setting of fetal type  PCAs. There is asymmetric poor signal within the right PCA beginning at the P2 segment, where there may be an underlying severe stenosis. No aneurysm findings. IMPRESSION: Brain MRI: 1. Large acute left MCA territory infarct most confluent along the insula, opercula, and basal ganglia. No evidence of infarct progression compared to head CT yesterday. 2. Petechial hemorrhage is seen within the left striatum. 3. Cytotoxic edema causes 5 mm of midline shift. Intracranial MRA: 1. Distal left M1 apparent occlusion with flow gap. 2. Poor flow in the right PCA beginning at the P2 segment, presumably from underlying advanced stenosis.  Electronically Signed   By: Marnee Spring M.D.   On: 08/23/2017 10:22   US Carotid Bilateral (at [facility] And [facility] Only)  Result Date: 08/23/2017 CLINICAL DATA:  66 year old male with a history of altered mental status. Cardiovascular risk factors include stroke/TIA EXAM: BILATERAL CAROTID DUPLEX ULTRASOUND TECHNIQUE: Wallace Cullens scale imaging, color Doppler and duplex ultrasound were performed of bilateral carotid and vertebral arteries in the neck. COMPARISON:  No prior duplex FINDINGS: Criteria: Quantification of carotid stenosis is based on velocity parameters that correlate the residual internal carotid diameter with NASCET-based stenosis levels, using the diameter of the distal internal carotid lumen as the denominator for stenosis measurement. The following velocity measurements were obtained: RIGHT ICA:  Systolic 72 cm/sec, Diastolic 28 cm/sec CCA:  91 cm/sec SYSTOLIC ICA/CCA RATIO:  0.8 ECA:  80 cm/sec LEFT ICA:  Systolic 62 cm/sec, Diastolic 15 cm/sec CCA:  90 cm/sec SYSTOLIC ICA/CCA RATIO:  0.7 ECA:  77 cm/sec Right Brachial SBP: Not acquired Left Brachial SBP: Not acquired RIGHT CAROTID ARTERY: No significant calcifications of the right common carotid artery. Intermediate waveform maintained. Heterogeneous and partially calcified plaque at the right carotid bifurcation. No significant  lumen shadowing. Low resistance waveform of the right ICA. Mild tortuosity RIGHT VERTEBRAL ARTERY: Antegrade flow with low resistance waveform. LEFT CAROTID ARTERY: No significant calcifications of the left common carotid artery. Intermediate waveform maintained. Heterogeneous and partially calcified plaque at the left carotid bifurcation without significant lumen shadowing. Low resistance waveform of the left ICA. Duplex demonstrates color signal within the plaque of the far field wall at the bifurcation. Mild tortuosity LEFT VERTEBRAL ARTERY:  Antegrade flow with low resistance waveform. IMPRESSION: Right: Color duplex indicates minimal heterogeneous and calcified plaque, with no hemodynamically significant stenosis by duplex criteria in the extracranial cerebrovascular circulation. Left: Color duplex indicates minimal heterogeneous and calcified plaque, with no hemodynamically significant stenosis by duplex criteria in the extracranial cerebrovascular circulation. Color duplex demonstrates changes suggesting ulcerated plaque at the carotid bifurcation. Signed, Yvone Neu. Loreta Ave, DO [facility] Electronically Signed   By: Gilmer Mor D.O.   On: 08/23/2017 14:26   Mr Maxine Glenn Head/brain ZO Cm  Result Date: 08/23/2017 CLINICAL DATA:  Unresponsive.  Abnormal head CT. EXAM: MRI HEAD WITHOUT CONTRAST MRA HEAD WITHOUT CONTRAST TECHNIQUE: Multiplanar, multiecho pulse sequences of the brain and surrounding structures were obtained without intravenous contrast. Angiographic images of the head were obtained using MRA technique without contrast. COMPARISON:  Head CT from yesterday FINDINGS: MRI HEAD FINDINGS Brain: Large area of acute infarct in the left MCA territory affecting the insula, frontotemporal operculum, inferolateral frontal lobe, and posterior temporal/parietal cortex. Infarcted striatum and anterior limb internal capsule with basal ganglia petechial  hemorrhage. Cytotoxic edema causes up to 5 mm of midline shift. Probable small remote left inferior cerebellar infarct. No hydrocephalus or masslike findings. Vascular: Arterial findings below. Normal dural venous sinus flow voids. Skull and upper cervical spine: Negative for marrow lesion Sinuses/Orbits: No acute finding. MRA HEAD FINDINGS Symmetric carotid arteries that are patent. Bilateral P1 hypoplasia. Flow gap at the distal left M1 segment with under opacification of the less numerous left MCA branches. Accounting for artifact, no stenosis or branch occlusion seen within bilateral ACA and right MCA vessels. Small vertebrobasilar vessels in the setting of fetal type PCAs. There is asymmetric poor signal within the right PCA beginning at the P2 segment, where there may be an underlying severe stenosis. No aneurysm findings. IMPRESSION: Brain MRI: 1. Large acute left  MCA territory infarct most confluent along the insula, opercula, and basal ganglia. No evidence of infarct progression compared to head CT yesterday. 2. Petechial hemorrhage is seen within the left striatum. 3. Cytotoxic edema causes 5 mm of midline shift. Intracranial MRA: 1. Distal left M1 apparent occlusion with flow gap. 2. Poor flow in the right PCA beginning at the P2 segment, presumably from underlying advanced stenosis. Electronically Signed   By: Marnee Spring M.D.   On: 08/23/2017 10:22    Scheduled Meds: . aspirin  300 mg Rectal Daily  . atropine      . chlorhexidine  15 mL Mouth Rinse BID  . enoxaparin (LOVENOX) injection  40 mg Subcutaneous Q24H  . mouth rinse  15 mL Mouth Rinse q12n4p   Continuous Infusions: . sodium chloride 100 mL/hr at 08/24/17 0505    Principal Problem:   Acute cerebrovascular accident (CVA) (HCC) Active Problems:   Right hemiparesis (HCC)   Volume depletion   Altered mental status   Aphasia   AKI (acute kidney injury) (HCC)   Hypernatremia   Bradycardia   Time spent: 30 min  Lorretta Harp, MD  [facility] Pager [phone]  If 7PM-7AM, please contact night-coverage [email or web address] Password [facility] 08/24/2017, 5:36 AM

## 2017-08-24 NOTE — Evaluation (Signed)
Speech Language Pathology Evaluation Patient Details Name: Mia CreekRichard P Ator MRN: [id number] DOB: 03/02/1951 Today's Date: 08/24/2017 Time: [id number] SLP Time Calculation (min) (ACUTE ONLY): 8 min  Problem List:  Patient Active Problem List   Diagnosis Date Noted  . Hypernatremia 08/24/2017  . Bradycardia 08/24/2017  . Right hemiparesis (HCC) 08/22/2017  . Volume depletion 08/22/2017  . Altered mental status 08/22/2017  . Aphasia 08/22/2017  . AKI (acute kidney injury) (HCC) 08/22/2017  . General weakness 08/22/2017  . Acute cerebrovascular accident (CVA) (HCC) 08/22/2017  . Acute CVA (cerebrovascular accident) (HCC) 08/22/2017   Past Medical History: History reviewed. No pertinent past medical history. Past Surgical History: History reviewed. No pertinent surgical history. HPI:  Ptis a 66 [facility] with a large subacute MCA infarct and 5 mm midline shift. [facility] of concern for swelling. No significant PMH is known.   Assessment / Plan / Recommendation Clinical Impression  Pt has a significant expressive > receptive mixed aphasia. He spontaneously vocalized x1 throughout evaluation, otherwise remaining nonverbal despite cueing for even repetition. He was approximately 50% accurate with simple yes/no questions with responses via head nods, making this an unreliable means of communicating. He did follow a few commands independently but overall needed Mod cues and functional context to complete one-step instructions. He showed some emergent awareness of his right-sided impairments but did not make attempts to problem solve around them. He will benefit from intensive SLP f/u.    SLP Assessment  SLP Recommendation/Assessment: Patient needs continued Speech Lanaguage Pathology Services SLP Visit Diagnosis: Aphasia (R47.01)    Follow Up Recommendations  Inpatient Rehab    Frequency and Duration min 2x/week  2 weeks      SLP  Evaluation Cognition  Overall Cognitive Status: Difficult to assess (aphasia) Arousal/Alertness: Awake/alert Awareness: Impaired Awareness Impairment: Anticipatory impairment;Emergent impairment       Comprehension  Auditory Comprehension Overall Auditory Comprehension: Impaired Yes/No Questions: Impaired Basic Biographical Questions: 26-50% accurate Commands: Impaired One Step Basic Commands: 50-74% accurate Reading Comprehension Reading Status: Not tested    Expression Expression Primary Mode of Expression: Verbal Verbal Expression Overall Verbal Expression: Impaired Initiation: Impaired Repetition: Impaired Level of Impairment: Word level Non-Verbal Means of Communication: Gestures   Oral / Motor  Oral Motor/Sensory Function Overall Oral Motor/Sensory Function:  (difficulty with commands but definite R droop) Motor Speech Overall Motor Speech: Other (comment) (UTA)   GO                    [name], Brealynn Contino 08/24/2017, 11:53 AM  [name], M.A. CCC-SLP [phone]

## 2017-08-24 NOTE — Evaluation (Signed)
Clinical/Bedside Swallow Evaluation Patient Details  Name: [name] [name] MRN: [id number] Date of Birth: 11/13/1951  Today's Date: 08/24/2017 Time: SLP Start Time (ACUTE ONLY): 1033 SLP Stop Time (ACUTE ONLY): 1040 SLP Time Calculation (min) (ACUTE ONLY): 7 min  Past Medical History: History reviewed. No pertinent past medical history. Past Surgical History: History reviewed. No pertinent surgical history. HPI:  Ptis a 66 y.o.malead[facility] with a large subacute MCA infarct and 5 mm midline shift. He was [facility] out of concern for swelling. No significant PMH is known.   Assessment / Plan / Recommendation Clinical Impression  Pt has no overt signs of aspiration, but he has moderate amounts of anterior spillage on his right side without awareness. Smaller amounts of puree are also pocketed in his buccal cavity. Multiple swallows are observed with thin liquids only, and vocal quality cannot be adequately assessed given that he cannot even repeat sounds. Recommend proceeding with MBS to better assess oropharyngeal function. SLP Visit Diagnosis: Dysphagia, unspecified (R13.10)    Aspiration Risk  Mild aspiration risk;Moderate aspiration risk    Diet Recommendation NPO except meds   Medication Administration: Crushed with puree    Other  Recommendations Oral Care Recommendations: Oral care QID Other Recommendations: Have oral suction available   Follow up Recommendations        Frequency and Duration            Prognosis Prognosis for Safe Diet Advancement: Good Barriers to Reach Goals: Language deficits      Swallow Study   General HPI: Ptis a 66 y.o.male[facility] with a large subacute MCA infarct and 5 mm midline shift. He was [facility] out of concern for swelling. No significant PMH is known. Type of Study: Bedside Swallow Evaluation Previous Swallow Assessment: none in chart Diet Prior to this  Study: NPO Temperature Spikes Noted: No Respiratory Status: Room air History of Recent Intubation: No Behavior/Cognition: Alert;Cooperative;Pleasant mood;Requires cueing;Other (Comment) (aphasic) Oral Cavity Assessment: Dry Oral Care Completed by SLP: No Oral Cavity - Dentition: Adequate natural dentition Vision: Functional for self-feeding Self-Feeding Abilities: Able to feed self Patient Positioning: Upright in bed Baseline Vocal Quality: Normal    Oral/Motor/Sensory Function Overall Oral Motor/Sensory Function: Other (comment) (unable to follow all commands but definite R droop)   Ice Chips Ice chips: Within functional limits Presentation: Spoon   Thin Liquid Thin Liquid: Impaired Presentation: Cup;Self Fed;Straw Oral Phase Impairments: Reduced labial seal;Poor awareness of bolus Oral Phase Functional Implications: Right anterior spillage Pharyngeal  Phase Impairments: Multiple swallows    Nectar Thick Nectar Thick Liquid: Not tested   Honey Thick Honey Thick Liquid: Not tested   Puree Puree: Impaired Presentation: Self Fed;Spoon Oral Phase Impairments: Reduced labial seal;Poor awareness of bolus Oral Phase Functional Implications: Right anterior spillage;Right lateral sulci pocketing   Solid   GO   Solid: Not tested        [name] [name], [name] [name] 08/24/2017,11:39 AM  [name] [name], M.A. CCC-SLP [phone]

## 2017-08-24 NOTE — Progress Notes (Signed)
STROKE TEAM PROGRESS NOTE   HISTORY OF PRESENT ILLNESS (per record) [name] [name] is a 66 y.o. male with no documented medical history presenting with right-sided weakness and expressive aphasia.  He has no home meds and intially [facility] with subacute L MCA infarct, and was [facility] out of concern for cerebral edema.  He is currently aphasic with right-sided weakness.  [name] [name] [facility] by EMS after being found on the ground, flaccid to the right side and laying in his own urine at his home. A family member went to check on him after not hearing from him for two days and found him this way. His lives with his elderly mother who he has been caring after she had a stroke. ER evaluation revealed a large subacute Left MCA CVA with 5 mm midline shift and hew as noted to be bradycardic down into the 30s.  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 He does not see doctors,  take any medications or have any known allergies. He was [facility] yesterday because of concern for brain swelling.    LKW: 9/2  Patient was not administered IV t-PA secondary to arriving outside of the treatment window. He was admitted to General Neurology for further evaluation and treatment.   SUBJECTIVE (INTERVAL HISTORY) No family at the bedside.  The patient is awake, alert, with expressive aphasia.  Persistent sinus brady on monitor with rate of 30-40 since arrival.  No beta blockers or other home meds.  No record of cardiology appointments or any healthcare office visits.  Cardiology consulted for persistent bradycardia,    OBJECTIVE Temp:  [97.1 F (36.2 C)-98.4 F (36.9 C)] 97.1 F (36.2 C) (09/06 0802) Pulse Rate:  [37-46] 37 (09/06 0415) Cardiac Rhythm: Sinus bradycardia (09/06 0740) Resp:  [0-20] 10 (09/06 0415) BP:  (101-141)/(55-106) 132/62 (09/06 0802) SpO2:  [91 %-100 %] 100 % (09/06 0415) Weight:  [80.9 kg (178 lb 5.6 oz)] 80.9 kg (178 lb 5.6 oz) (09/05 2200)  CBC:  Recent Labs Lab 08/22/17 1612  08/23/17 0425 08/24/17 0102  WBC 15.4*  --  14.6* 10.9*  NEUTROABS 13.1*  --   --   --   HGB 17.2*  < > 15.2 13.5  HCT 50.6  < > 46.4 41.4  MCV 92.8  --  96.3 93.5  PLT 185  --  133* 134*  < > = values in this interval not displayed.  Basic Metabolic Panel:  Recent Labs Lab 08/24/17 0102 08/24/17 0647  NA 147* 147*  K 3.7 3.9  CL 116* 114*  CO2 25 24  GLUCOSE 84 84  BUN 25* 25*  CREATININE 1.17 1.20  CALCIUM 8.4* 8.5*    Lipid Panel:    Component Value Date/Time   CHOL 149 08/23/2017 0425   TRIG 102 08/23/2017 0425   HDL 51 08/23/2017 0425   CHOLHDL 2.9 08/23/2017 0425   VLDL 20 08/23/2017 0425   LDLCALC 78 08/23/2017 0425   HgbA1c:  Lab Results  Component Value Date   HGBA1C 5.2 08/22/2017   Urine Drug Screen:    Component Value Date/Time   LABOPIA NONE DETECTED 08/22/2017 1624   COCAINSCRNUR NONE DETECTED 08/22/2017 1624   LABBENZ NONE DETECTED 08/22/2017 1624   AMPHETMU NONE DETECTED 08/22/2017 1624   THCU  NONE DETECTED 08/22/2017 1624   LABBARB NONE DETECTED 08/22/2017 1624    Alcohol Level     Component Value Date/Time   ETH <5 08/22/2017 1612    IMAGING  Dg Chest 2 View 08/22/2017 IMPRESSION: No active cardiopulmonary disease.  Ct Head Wo Contrast IMPRESSION: 1. Acute large LEFT MCA territory infarct with LEFT basal ganglia suspected petechial hemorrhage. 3 mm LEFT-to-RIGHT midline shift without ventricular entrapment or hydrocephalus.  Mr Brain 30 Contrast Mr Maxine Glenn Head/brain Wo Cm 08/23/2017 IMPRESSION: Brain MRI: 1. Large acute left MCA territory infarct most confluent along the insula, opercula, and basal ganglia. No evidence of infarct progression compared to head CT yesterday. 2. Petechial hemorrhage is seen within the left striatum. 3. Cytotoxic edema  causes 5 mm of midline shift. Intracranial MRA: 1. Distal left M1 apparent occlusion with flow gap. 2. Poor flow in the right PCA beginning at the P2 segment, presumably from underlying advanced stenosis.  US Carotid Bilateral (at [facility] Only) 08/23/2017 IMPRESSION: Right: Color duplex indicates minimal heterogeneous and calcified plaque, with no hemodynamically significant stenosis by duplex criteria in the extracranial cerebrovascular circulation. Left: Color duplex indicates minimal heterogeneous and calcified plaque, with no hemodynamically significant stenosis by duplex criteria in the extracranial cerebrovascular circulation. Color duplex demonstrates changes suggesting ulcerated plaque at the carotid bifurcation.    TTE 08/23/2017 Study Conclusions - Left ventricle: The cavity size was normal. Wall thickness was   normal. Systolic function was normal. The estimated ejection   fraction was 55%. Wall motion was normal; there were no regional   wall motion abnormalities. Left ventricular diastolic function   parameters were normal. - Aortic valve: Trileaflet; mildly thickened leaflets. There was   trivial regurgitation. - Aorta: Mild aortic root dilatation. Aortic root dimension: 39 mm   (ED). - Mitral valve: There was mild regurgitation. - Left atrium: The atrium was mildly dilated. - Atrial septum: No defect or patent foramen ovale was identified. - Pulmonic valve: There was mild regurgitation.      PHYSICAL EXAM Pleasant caucasian male not in distress. . Afebrile. Head is nontraumatic. Neck is supple without bruit.    Cardiac exam no murmur or gallop. Lungs are clear to auscultation. Distal pulses are well felt. Neurological Exam :  Awake alert oriented 3. Expressive aphasia and can speak only a few words and occasional short sentences. Comprehension appears to be preserved. Difficulty with naming and repetition. Follows one and a few two-step commands. Extraocular movements  are full range without nystagmus. Blinks to threat on the left and to lesser extent on the right. Pupils equal reactive. Fundi were not visualized. Moderate right lower facial weakness. Tongue midline. Motor system exam reveals right hemiplegia with right upper extremity 3/5 strength with significant right hand and grip weakness. Mild right hip exam ankle dorsiflexor weakness. Sensation is symmetric bilaterally plantars are downgoing. Deep tendon reflexes are symmetric. Gait was not tested. ASSESSMENT/PLAN [name] [name] is a 66 y.o. male with no documented medical history presenting with right-sided weakness and expressive aphasia. He did not receive IV t-PA due to arriving outside of the tPA treatment window.   Stroke: Large acute left MCA territory infarct most confluent along the insula, opercula, and basal ganglia with petechial hemorrhage and cytotoxic edema with 5mm of midline shift, embolic, of undetermined etiology in the setting of persistent asymptomatic bradycardia.  Resultant  expressive aphasia and right hemiplegia  CT head: Acute large LEFT MCA territory infarct with LEFT basal ganglia suspected petechial hemorrhage  and 3mm rightward midline shift.  MRI head: Large acute left MCA territory infarct most confluent along the insula, opercula, and basal ganglia with petechial hemorrhage and cytotoxic edema with 5mm of midline shift  MRA head: L M1 occlusion, advanced R P2 stenosis  Carotid Doppler: B ICA 1-39% stenosis, VAs antegrade  2D Echo: EF 55%. No source of embolus   LDL 78  HgbA1c 5.2  Lovenox 40 mg sq daily for VTE prophylaxis  Diet NPO time specified  No antithrombotic prior to admission, now on aspirin 300 mg suppository daily  Patient counseled to be compliant with his antithrombotic medications  Ongoing aggressive stroke risk factor management  Therapy recommendations:  pending  Disposition: pending  Persistent Bradycardia  Persistent rate in  40s, aymptomatic  Hemodynamically stable  Cardiology consulted, recommending EP consult for pacemaker  Hyperlipidemia  Home meds: none  LDL 78, goal < 70  Pt currently NPO, will consider statin after pt passes swallow eval  Continue statin at discharge  Other Stroke Risk Factors  Family hx stroke (mother)  Other Active Problems  None  Hospital day # 2  I have personally examined this patient, reviewed notes, independently viewed imaging studies, participated in medical decision making and plan of care.ROS completed by me personally and pertinent positives fully documented  I have made any additions or clarifications directly to the above note. He has presented with expressive aphasia and right hemiplegia due to embolic left MCA branch infarct with some cytotoxic edema. Recommend further evaluation with cardiac source of embolism but checking TEE and cardiology consult. Continue aspirin for stroke prevention. Greater than 50% time during this 35 minute visit  was spent on counseling and coordination of care about his embolic stroke and answering questions  Delia Heady, MD Medical Director [facility] Pager: [phone] 08/24/2017 6:05 PM   To contact Stroke Continuity provider, please refer to [email or web address]. After hours, contact General Neurology

## 2017-08-24 NOTE — Care Management Note (Addendum)
Case Management Note  Patient Details  Name: Mia CreekRichard P Querry MRN: [id number] Date of Birth: 07/03/1951  Subjective/Objective:    Presents with CVA with bradycardia, cards consulted. NCM [facility] ,if patient still in hospital will need to reschedule.   9/10 Letha Capeeborah Arryn Terrones RN, BSN- large left MCA infarct, bradycardia, afib with rvr, rhabo,aki, s/p bilateral embolectomy iliac femoral popiliteal 9/8, for fasciotomy closure today, conts on hep drip and ivf's.   Plan for SNF.    9/12 1516 Letha Capeeborah Nashayla Telleria RN, BSN - POD 2 embolectomy. conts on heparin drip, amio, and ivf's, plan is SNF.  Discussed in LOS , appropriate for continued stay.     9/14 1524 Letha Capeeborah Rozalynn Buege RN, BSN -no RX coverage per benefit check for eliquis. NCM will leave 30 day coupon in room with patient.  Plan is for SNF.        Action/Plan: NCM will follow for dc needs.  Expected Discharge Date:                  Expected Discharge Plan:     In-House Referral:     Discharge planning Services  CM Consult  Post Acute Care Choice:    Choice offered to:     DME Arranged:    DME Agency:     HH Arranged:    HH Agency:     Status of Service:  In process, will continue to follow  If discussed at Long Length of Stay Meetings, dates discussed:    Additional Comments:  Leone Havenaylor, Demarrio Menges [place], RN 08/24/2017, 5:10 PM

## 2017-08-25 DIAGNOSIS — R4182 Altered mental status, unspecified: Secondary | ICD-10-CM

## 2017-08-25 DIAGNOSIS — E860 Dehydration: Secondary | ICD-10-CM

## 2017-08-25 DIAGNOSIS — E870 Hyperosmolality and hypernatremia: Secondary | ICD-10-CM

## 2017-08-25 LAB — BASIC METABOLIC PANEL
ANION GAP: 7 (ref 5–15)
BUN: 22 mg/dL — ABNORMAL HIGH (ref 6–20)
CHLORIDE: 115 mmol/L — AB (ref 101–111)
CO2: 22 mmol/L (ref 22–32)
Calcium: 8.4 mg/dL — ABNORMAL LOW (ref 8.9–10.3)
Creatinine, Ser: 1.07 mg/dL (ref 0.61–1.24)
GFR calc non Af Amer: >60 mL/min (ref >60)
Glucose, Bld: 86 mg/dL (ref 65–99)
POTASSIUM: 4.6 mmol/L (ref 3.5–5.1)
SODIUM: 144 mmol/L (ref 135–145)

## 2017-08-25 LAB — URINALYSIS, ROUTINE W REFLEX MICROSCOPIC
BILIRUBIN URINE: NEGATIVE
Glucose, UA: NEGATIVE mg/dL
KETONES UR: NEGATIVE mg/dL
NITRITE: NEGATIVE
Protein, ur: 30 mg/dL — AB
SPECIFIC GRAVITY, URINE: 1.016 (ref 1.005–1.030)
pH: 5 (ref 5.0–8.0)

## 2017-08-25 MED ORDER — ATORVASTATIN CALCIUM 20 MG PO TABS
20.0000 mg | ORAL_TABLET | Freq: Every day | ORAL | Status: DC
  Administered 2017-08-25 – 2017-09-04 (×9): 20 mg via ORAL
  Filled 2017-08-25 (×9): qty 1

## 2017-08-25 MED ORDER — DEXTROSE 5 % IV SOLN
INTRAVENOUS | Status: DC
  Administered 2017-08-25: 1000 mL via INTRAVENOUS
  Administered 2017-08-25: 500 mL via INTRAVENOUS
  Administered 2017-08-26: 03:00:00 via INTRAVENOUS

## 2017-08-25 NOTE — Plan of Care (Signed)
Problem: Safety: Goal: Ability to remain free from injury will improve Outcome: Completed/Met Date Met: 08/25/17 Patient is alert. Responds to commands appropriately. Bed and chair alarms utilized.

## 2017-08-25 NOTE — Progress Notes (Signed)
Patient stable with HR as low as 32. Pacing pads in place and atropine at bedside.  Up to chair today with PT for approx 2 hours.

## 2017-08-25 NOTE — Progress Notes (Signed)
OT Cancellation Note  Patient Details Name: [name] [name] MRN: [id number] DOB: 03/02/1951   Cancelled Treatment:    Reason Eval/Treat Not Completed: Medical issues which prohibited therapy (continues to have active bedrest order)  Evern BioMayberry, Lucresia Simic Lynn 08/25/2017, 10:17 AM  08/25/2017 Martie RoundJulie Makeda Peeks, OTR/L Pager: [phone]

## 2017-08-25 NOTE — Evaluation (Signed)
Physical Therapy Evaluation Patient Details Name: [name] [name] MRN: [id number] DOB: Aug 06, 1951 Today's Date: 08/25/2017   History of Present Illness  Patient is a 66 y/o male who was found down at home covered in urine. 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 No PMH as pt has not been to the doctor.  Clinical Impression  Patient presents with right hemiparesis, receptive and expressive aphasia, and impaired mobility s/p above. Tolerated standing and SPT to chair with Mod-Max A of 2. Difficult to get history due to aphasia. 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 Has great rehab potential. Would benefit from [name] to maximize independence and mobility prior to return home. Will follow acutely.    Follow Up Recommendations [name]    Equipment Recommendations  Other (comment) (TBA)    Recommendations for Other Services Rehab consult     Precautions / Restrictions Precautions Precautions: Fall Restrictions Weight Bearing Restrictions: No      Mobility  Bed Mobility Overal bed mobility: Needs Assistance Bed Mobility: Rolling;Sidelying to Sit Rolling: Min guard Sidelying to sit: HOB elevated;Mod assist       General bed mobility comments: Step by step cues for technique and to use LUE to reach towards rail; assist with elevating trunk and to scoot bottom to EOB.  Transfers Overall transfer level: Needs assistance Equipment used: 2 person hand held assist Transfers: Sit to/from [employer] Sit to Stand: Mod assist;+2 physical assistance;From elevated surface Stand pivot transfers: Max assist;+2 physical assistance       General transfer comment: Assist of 2 to stand from EOB with cues for anterior weight shift, stood from EOB x2. Cues for upright posture. Right knee instability requiring support esp with weight shifting and stepping. Max A of 2 for SPT to chair towards left side; pt not able to stand fully upright and trying to squat  pivot.  Ambulation/Gait                Stairs            Wheelchair Mobility    Modified Rankin (Stroke Patients Only) Modified Rankin (Stroke Patients Only) Pre-Morbid Rankin Score: No symptoms Modified Rankin: Moderately severe disability     Balance Overall balance assessment: Needs assistance Sitting-balance support: Feet supported;No upper extremity supported Sitting balance-Leahy Scale: Fair Sitting balance - Comments: Able to sit unsupported EOB. Pt able to scoot bottom to EOB with Min A with posterior LOB but able to recover.    Standing balance support: During functional activity Standing balance-Leahy Scale: Poor Standing balance comment: Requires Min-Mod A for standing balance; attempted to pick up feet for stepping and right knee buckling.                             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      Hand Dominance   Dominant Hand: Right    Extremity/Trunk  Assessment   Upper Extremity Assessment Upper Extremity Assessment: Defer to OT evaluation;RUE deficits/detail RUE Deficits / Details: Flaccid RUE with minor shoulder activation. RUE Sensation:  (WFL per pt report however not sure of accuracy due to aphasia) RUE Coordination: decreased fine motor;decreased gross motor    Lower Extremity Assessment Lower Extremity Assessment: RLE deficits/detail RLE Deficits / Details: Grossly ~2+/5  knee extension, hip flexion, 1/5 DF.  RLE Sensation:  (WFL per pt report however not sure of accuracy due to aphasia.) RLE Coordination: decreased fine motor;decreased gross motor    Cervical / Trunk Assessment Cervical / Trunk Assessment: Normal  Communication   Communication: Receptive difficulties;Expressive difficulties  Cognition Arousal/Alertness: Awake/alert Behavior During Therapy: Flat affect (Seems visibly frustrated) Overall Cognitive Status: Difficult to assess Area of Impairment: Orientation;Following commands                       Following Commands: Follows multi-step commands inconsistently;Follows one step commands consistently;Follows one step commands with increased time       General Comments: Nodding yes/no inconsistently to orientation questions; nods yes to his name but inconsistently nods yes to different locations. Pt with right inattention; requires multimodal cues to follow 2 step commands with increased time. Nonverbal throughout session.       General Comments General comments (skin integrity, edema, etc.): HR up to 60 bpm during session but sustained in 40s.    Exercises     Assessment/Plan    PT Assessment Patient needs continued PT services  PT Problem List Decreased strength;Decreased mobility;Decreased safety awareness;Impaired tone;Decreased activity tolerance;Decreased balance;Cardiopulmonary status limiting activity;Decreased cognition;Decreased range of motion;Decreased coordination       PT Treatment Interventions Therapeutic activities;Cognitive remediation;Gait training;Therapeutic exercise;Patient/family education;Balance training;Functional mobility training;Neuromuscular re-education    PT Goals (Current goals can be found in the Care Plan section)  Acute Rehab PT Goals Patient Stated Goal: none stated but most likely to return to PLOF PT Goal Formulation: Patient unable to participate in goal setting Time For Goal  Achievement: 09/08/17 Potential to Achieve Goals: Fair    Frequency Min 4X/week   Barriers to discharge Decreased caregiver support caregiver for mother    Co-evaluation PT/OT/SLP Co-Evaluation/Treatment: Yes Reason for Co-Treatment: Necessary to address cognition/behavior during functional activity;To address functional/ADL transfers;For patient/therapist safety PT goals addressed during session: Mobility/safety with mobility         AM-PAC PT "6 Clicks" Daily Activity  Outcome Measure Difficulty turning over in bed (including adjusting bedclothes, sheets and blankets)?: None Difficulty moving from lying on back to sitting on the side of the bed? : Unable Difficulty sitting down on and standing up from a chair with arms (e.g., wheelchair, bedside commode, etc,.)?: Unable Help needed moving to and from a bed to chair (including a wheelchair)?: A Lot Help needed walking in hospital room?: Total Help needed climbing 3-5 steps with a railing? : Total 6 Click Score: 10    End of Session Equipment Utilized During Treatment: Gait belt Activity Tolerance: Patient tolerated treatment well Patient left: in chair;with call bell/phone within reach;with chair alarm set Nurse Communication: Mobility status PT Visit Diagnosis: Hemiplegia and hemiparesis;Unsteadiness on feet (R26.81);Difficulty in walking, not elsewhere classified (R26.2) Hemiplegia - Right/Left: Right Hemiplegia - dominant/non-dominant: Dominant Hemiplegia - caused by: Cerebral infarction    Time: 1023-1050 PT Time Calculation (min) (ACUTE ONLY): 27 min   Charges:   PT Evaluation $PT Eval Moderate Complexity: 1 Mod     PT G Codes:  Mylo RedShauna Karlita Lichtman, PT, DPT [phone]    Blake DivineShauna A Shakeisha Horine 08/25/2017, 12:06 PM

## 2017-08-25 NOTE — Progress Notes (Signed)
Removed patient's foley catheter this morning.  Patient's penis appears swollen and red. Patient endorses pain/tenderness at the site.  After inquiry, patient informed RN he is uncircumcised.  However, RN unable to return foreskin to normal position.  Have placed condom catheter and will monitor as well as inform oncoming nurse for close monitoring.

## 2017-08-25 NOTE — Progress Notes (Signed)
PROGRESS NOTE    [name] [name]  ZOX:[id number] DOB: 06-Jul-1951 DOA: 08/22/2017 PCP: Patient, No Pcp Per   Chief Complaint  Patient presents with  . Altered Mental Status    Brief Narrative:  HPI on 08/22/2017 by [name] [name] [name] is a 66 y.o. male with unknown prior medical history, per ED notes a family member found him on the floor in urine, they went by his home because he hadn't heard from him in two days. They asked the mother who was at the home how long this had been going and she also stated two days. Upon presentation the patient was completely flaccid R side, oriented name only, unable to speak and wouldn't follow commands. 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  We are asked to see for admission.   Patient unable to speak, but does weakly respond to simple questions.   Per pt's step- brother, [name], the patient has never been sick.  Walks every day, doesn't smoke or drink.  He grew up in [place], [place].  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  Never went to the doctor, never complained about anything.  He retired 5-10 yrs ago.  He doesn't take any medication that the step-brother knows of.   Their mother had a stroke last year and the patient has been looking after her. It can be stressful for the stepbrother.     Interim history Found to have acute CVA and transferred to [name] from [facility]. Neurology consulted and following. Patient also found to have sinus bradycardia, cardiology consulted and appreciated. Patient plan for TEE with possible loop recorder on 08/28/2017. Assessment & Plan   Acute left MCA CVA -Presented with right-sided hemoperfusion as well as expressive aphasia -CT head: Acute left MCA territory infarct with left basal gain suspected petechial hemorrhage, 3 mm rightward midline shift -MRI brain: Large acute left MCA territory infarct most confluent along the insula, operular, basal  ganglia, with petechial hemorrhage within the left striatum, cytotoxic edema causes 5mm of midlinie shift.  -MRA head: Left M1 occlusion, advanced right P2 stenosis -Carotid Doppler: Bilateral ICA 1-39% stenosis, vertebral artery flow antegrade -Echocardiogram shows an EF of 55%, no source of emboli -LDL 78, hemoglobin A1c 5.2 -Speech therapy working with patient, currently on dysphagia 1 diet -Continue aspirin -Will start statin -Cardiology consulted for TEE and possible loop recorder, will occur on 08/28/2017 -Pending PT and OT evaluations  Sinus bradycardia -Heart rate at rest the 30s, however while patient is been awake, heart rate has also been 30s, however hemodynamically stable and asymptomatic -Cardiology consultation appreciated feels this could be due to increased vagal tone following CVA -Does not feel patient needs pacing at this time  Dysuria -will obtain UA. UA on admission was unremarkable -possibly secondary to trauma vs infection given that patient had foley catheter which was removed -Continue to monitor    Dehydration/hypernatremia -Placed on IV fluids, sodium peaked to 150 -Have changed patient's fluids to D5 water -Will continue to monitor BMP  Acute kidney injury -Possibly secondary to dehydration and acute mild rhabdomyolysis -Presented with creatinine of 1.43, placed on IV fluids -Creatinine currently 1.07 -Continue to monitor BMP  Mild rhabdomyolysis -Likely secondary to the above  Leukocytosis -Suspect reactive to CVA -Patient currently afebrile -A culture show no growth to date -UA and chest x-ray unremarkable for infection  Hyperlipidemia -Will start patient on statin  Diminished lower extremity pulses -pulses  unable to be palpated with use of doppler (per RN) -Will order ABI and LE doppler  DVT Prophylaxis  lovenox  Code Status: Full  Family Communication: None at bedside  Disposition Plan: Admitted. Continue to monitor in stepdown  given patient's bradycardia. Dispo pending.  Consultants Neurology Cardiology  Procedures  Echocardiogram  Antibiotics   Anti-infectives    Start     Dose/Rate Route Frequency Ordered Stop   08/22/17 1715  vancomycin (VANCOCIN) IVPB 1000 mg/200 mL premix     1,000 mg 200 mL/hr over 60 Minutes Intravenous  Once 08/22/17 1710 08/22/17 1935   08/22/17 1715  piperacillin-tazobactam (ZOSYN) IVPB 3.375 g     3.375 g 100 mL/hr over 30 Minutes Intravenous  Once 08/22/17 1710 08/22/17 1838      Subjective:   [name] [name] seen and examined today.  Patient continues to be a phasic. Does respond to yes and no questions by shaking and nodding his head. Currently denies chest pain, shortness of breath, dizziness or headache. Does complain of abdominal pain, and pain with urination.   Objective:   Vitals:   08/25/17 1100 08/25/17 1148 08/25/17 1150 08/25/17 1205  BP: 101/67  (!) 115/59 94/69  Pulse:  (!) 33 (!) 34 (!) 37  Resp:  20 (!) 21 16  Temp: (!) 96.2 F (35.7 C) 97.6 F (36.4 C)    TempSrc: Axillary Oral    SpO2:  97% 98% 97%  Weight:      Height:        Intake/Output Summary (Last 24 hours) at 08/25/17 1208 Last data filed at 08/25/17 1135  Gross per 24 hour  Intake          2846.25 ml  Output             1850 ml  Net           996.25 ml   Filed Weights   08/23/17 0210 08/23/17 0500 08/23/17 2200  Weight: 78.5 kg (173 lb 1 oz) 78.5 kg (173 lb 1 oz) 80.9 kg (178 lb 5.6 oz)    Exam  General: Well developed, well nourished, NAD, appears stated age  HEENT: NCAT, mucous membranes moist.   Neck: Supple, no JVD, no masses  Cardiovascular: S1 S2 auscultated, no rubs, murmurs or gallops. bradycardic  Respiratory: Clear to auscultation bilaterally with equal chest rise  Abdomen: Soft, nontender, nondistended, + bowel sounds  Extremities: warm dry without cyanosis clubbing or edema  Neuro: Awake and alert, cannot assess orientation given aphasia, but is able to  node/shake his head to yes/no questions. Right sided hemiparesis   Psych: Appropriate   Data Reviewed: I have personally reviewed following labs and imaging studies  CBC:  Recent Labs Lab 08/22/17 1612 08/22/17 1624 08/23/17 0425 08/24/17 0102  WBC 15.4*  --  14.6* 10.9*  NEUTROABS 13.1*  --   --   --   HGB 17.2* 17.7* 15.2 13.5  HCT 50.6 52.0 46.4 41.4  MCV 92.8  --  96.3 93.5  PLT 185  --  133* 134*   Basic Metabolic Panel:  Recent Labs Lab 08/23/17 0425 08/23/17 1007 08/24/17 0102 08/24/17 0647 08/25/17 0709  NA 150* 147* 147* 147* 144  K 4.1 4.2 3.7 3.9 4.6  CL 119* 117* 116* 114* 115*  CO2 24 22 25 24 22   GLUCOSE 106* 99 84 84 86  BUN 37* 34* 25* 25* 22*  CREATININE 1.43* 1.24 1.17 1.20 1.07  CALCIUM 8.1* 8.5* 8.4* 8.5* 8.4*  GFR: Estimated Creatinine Clearance: 75.7 mL/min (by C-G formula based on SCr of 1.07 mg/dL). Liver Function Tests:  Recent Labs Lab 08/22/17 1612 08/23/17 1007  AST 47* 33  ALT 30 26  ALKPHOS 56 44  BILITOT 1.5* 1.3*  PROT 8.2* 6.4*  ALBUMIN 4.1 3.1*   No results for input(s): LIPASE, AMYLASE in the last 168 hours.  Recent Labs Lab 08/22/17 1643  AMMONIA 23   Coagulation Profile:  Recent Labs Lab 08/22/17 1612  INR 1.09   Cardiac Enzymes:  Recent Labs Lab 08/22/17 1650 08/22/17 2155 08/23/17 0425 08/23/17 1007  CKTOTAL 709*  --   --  380  TROPONINI  --  <0.03 <0.03 <0.03   BNP (last 3 results) No results for input(s): PROBNP in the last 8760 hours. HbA1C:  Recent Labs  08/22/17 1612  HGBA1C 5.2   CBG:  Recent Labs Lab 08/23/17 2249 08/24/17 0353  GLUCAP 86 79   Lipid Profile:  Recent Labs  08/23/17 0425  CHOL 149  HDL 51  LDLCALC 78  TRIG 102  CHOLHDL 2.9   Thyroid Function Tests: No results for input(s): TSH, T4TOTAL, FREET4, T3FREE, THYROIDAB in the last 72 hours. Anemia Panel: No results for input(s): VITAMINB12, FOLATE, FERRITIN, TIBC, IRON, RETICCTPCT in the last 72  hours. Urine analysis:    Component Value Date/Time   COLORURINE YELLOW 08/25/2017 1026   APPEARANCEUR HAZY (A) 08/25/2017 1026   LABSPEC 1.016 08/25/2017 1026   PHURINE 5.0 08/25/2017 1026   GLUCOSEU NEGATIVE 08/25/2017 1026   HGBUR LARGE (A) 08/25/2017 1026   BILIRUBINUR NEGATIVE 08/25/2017 1026   KETONESUR NEGATIVE 08/25/2017 1026   PROTEINUR 30 (A) 08/25/2017 1026   NITRITE NEGATIVE 08/25/2017 1026   LEUKOCYTESUR MODERATE (A) 08/25/2017 1026   Sepsis Labs: (procalcitonin:4,lacticidven:4)  ) Recent Results (from the past 240 hour(s))  Blood Culture (routine x 2)     Status: None (Preliminary result)   Collection Time: 08/22/17  4:40 PM  Result Value Ref Range Status   Specimen Description RIGHT ANTECUBITAL  Final   Special Requests   Final    BOTTLES DRAWN AEROBIC AND ANAEROBIC Blood Culture adequate volume   Culture NO GROWTH 3 DAYS  Final   Report Status PENDING  Incomplete  Blood Culture (routine x 2)     Status: None (Preliminary result)   Collection Time: 08/22/17  4:44 PM  Result Value Ref Range Status   Specimen Description BLOOD LEFT HAND  Final   Special Requests   Final    BOTTLES DRAWN AEROBIC AND ANAEROBIC Blood Culture adequate volume   Culture NO GROWTH 3 DAYS  Final   Report Status PENDING  Incomplete  MRSA PCR Screening     Status: None   Collection Time: 08/23/17  1:59 AM  Result Value Ref Range Status   MRSA by PCR NEGATIVE NEGATIVE Final    Comment:        The GeneXpert MRSA Assay (FDA approved for NASAL specimens only), is one component of a comprehensive MRSA colonization surveillance program. It is not intended to diagnose MRSA infection nor to guide or monitor treatment for MRSA infections.       Radiology Studies: US Carotid Bilateral (at [facility] Only)  Result Date: 08/23/2017 CLINICAL DATA:  66 year old male with a history of altered mental status. Cardiovascular risk factors include stroke/TIA EXAM: BILATERAL CAROTID  DUPLEX ULTRASOUND TECHNIQUE: Wallace Cullens scale imaging, color Doppler and duplex ultrasound were performed of bilateral carotid and vertebral arteries in the neck.  COMPARISON:  No prior duplex FINDINGS: Criteria: Quantification of carotid stenosis is based on velocity parameters that correlate the residual internal carotid diameter with NASCET-based stenosis levels, using the diameter of the distal internal carotid lumen as the denominator for stenosis measurement. The following velocity measurements were obtained: RIGHT ICA:  Systolic 72 cm/sec, Diastolic 28 cm/sec CCA:  91 cm/sec SYSTOLIC ICA/CCA RATIO:  0.8 ECA:  80 cm/sec LEFT ICA:  Systolic 62 cm/sec, Diastolic 15 cm/sec CCA:  90 cm/sec SYSTOLIC ICA/CCA RATIO:  0.7 ECA:  77 cm/sec Right Brachial SBP: Not acquired Left Brachial SBP: Not acquired RIGHT CAROTID ARTERY: No significant calcifications of the right common carotid artery. Intermediate waveform maintained. Heterogeneous and partially calcified plaque at the right carotid bifurcation. No significant lumen shadowing. Low resistance waveform of the right ICA. Mild tortuosity RIGHT VERTEBRAL ARTERY: Antegrade flow with low resistance waveform. LEFT CAROTID ARTERY: No significant calcifications of the left common carotid artery. Intermediate waveform maintained. Heterogeneous and partially calcified plaque at the left carotid bifurcation without significant lumen shadowing. Low resistance waveform of the left ICA. Duplex demonstrates color signal within the plaque of the far field wall at the bifurcation. Mild tortuosity LEFT VERTEBRAL ARTERY:  Antegrade flow with low resistance waveform. IMPRESSION: Right: Color duplex indicates minimal heterogeneous and calcified plaque, with no hemodynamically significant stenosis by duplex criteria in the extracranial cerebrovascular circulation. Left: Color duplex indicates minimal heterogeneous and calcified plaque, with no hemodynamically significant stenosis by duplex  criteria in the extracranial cerebrovascular circulation. Color duplex demonstrates changes suggesting ulcerated plaque at the carotid bifurcation. Signed, Yvone Neu. Loreta Ave, DO [facility] Electronically Signed   By: Gilmer Mor D.O.   On: 08/23/2017 14:26   Dg Swallowing Func-speech Pathology  Result Date: 08/24/2017 Objective Swallowing Evaluation: Type of Study: MBS-Modified Barium Swallow Study Patient Details Name: [name] [name] MRN: [id number] Date of Birth: 1951/03/31 Today's Date: 08/24/2017 Time: SLP Start Time (ACUTE ONLY): 1258-SLP Stop Time (ACUTE ONLY): 1316 SLP Time Calculation (min) (ACUTE ONLY): 18 min Past Medical History: No past medical history on file. Past Surgical History: No past surgical history on file. HPI: [name] [name] 9/4 with a large subacute MCA infarct and 5 mm midline shift. He was transferred to [facility] out of concern for swelling. No significant PMH is known. Subjective: pt alert but nonverbal Assessment / Plan / Recommendation CHL IP CLINICAL IMPRESSIONS 08/24/2017 Clinical Impression Pt has a moderate oral and mild pharyngeal dysphagia due to right-sided weakness and sensory deficits. 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 He also has disorganized bolus formation and slow transit, with R sided pocketing that is most prevalent with soft solids as almost the entire bolus remains in his buccal cavity until it is manually removed by SLP. His pharyngeal phase is grossly functional except for mild base of tongue weakness that allows for some vallecular residue; however, his poor oral control also results in intermittent premature spillage that causes silent penetration to the true vocal folds with thin and nectar thick liquids. When he consumed nectar thick liquids by straw, his oral containment was improved, even  with challenging. Recommend initiation of Dys 1 diet and nectar thick liquids by straw.  SLP Visit Diagnosis Dysphagia, oropharyngeal phase (R13.12) Attention and concentration deficit following -- Frontal lobe and executive function deficit following -- Impact on safety and function Mild aspiration risk;Moderate aspiration  risk   CHL IP TREATMENT RECOMMENDATION 08/24/2017 Treatment Recommendations Therapy as outlined in treatment plan below   Prognosis 08/24/2017 Prognosis for Safe Diet Advancement Good Barriers to Reach Goals Language deficits Barriers/Prognosis Comment -- CHL IP DIET RECOMMENDATION 08/24/2017 SLP Diet Recommendations Dysphagia 1 (Puree) solids;Nectar thick liquid Liquid Administration via Straw only (NO cup sips) Medication Administration Crushed with puree Compensations Slow rate;Small sips/bites;Lingual sweep for clearance of pocketing;Monitor for anterior loss Postural Changes Seated upright at 90 degrees   CHL IP OTHER RECOMMENDATIONS 08/24/2017 Recommended Consults -- Oral Care Recommendations Oral care BID Other Recommendations Have oral suction available;Order thickener from pharmacy;Prohibited food (jello, ice cream, thin soups);Remove water pitcher   CHL IP FOLLOW UP RECOMMENDATIONS 08/24/2017 Follow up Recommendations Inpatient Rehab   CHL IP FREQUENCY AND DURATION 08/24/2017 Speech Therapy Frequency (ACUTE ONLY) min 2x/week Treatment Duration 2 weeks      CHL IP ORAL PHASE 08/24/2017 Oral Phase Impaired Oral - Pudding Teaspoon -- Oral - Pudding Cup -- Oral - Honey Teaspoon -- Oral - Honey Cup -- Oral - Nectar Teaspoon -- Oral - Nectar Cup Right anterior bolus loss;Weak lingual manipulation;Reduced posterior propulsion;Delayed oral transit;Decreased bolus cohesion;Premature spillage Oral - Nectar Straw Weak lingual manipulation;Reduced posterior propulsion;Delayed oral transit;Decreased bolus cohesion;Premature spillage Oral - Thin Teaspoon -- Oral - Thin Cup Right anterior bolus loss;Weak lingual  manipulation;Reduced posterior propulsion;Delayed oral transit;Decreased bolus cohesion;Premature spillage Oral - Thin Straw -- Oral - Puree Right anterior bolus loss;Weak lingual manipulation;Reduced posterior propulsion;Delayed oral transit;Decreased bolus cohesion;Right pocketing in lateral sulci Oral - Mech Soft Right anterior bolus loss;Weak lingual manipulation;Reduced posterior propulsion;Delayed oral transit;Decreased bolus cohesion;Premature spillage;Right pocketing in lateral sulci Oral - Regular -- Oral - Multi-Consistency -- Oral - Pill -- Oral Phase - Comment --  CHL IP PHARYNGEAL PHASE 08/24/2017 Pharyngeal Phase Impaired Pharyngeal- Pudding Teaspoon -- Pharyngeal -- Pharyngeal- Pudding Cup -- Pharyngeal -- Pharyngeal- Honey Teaspoon -- Pharyngeal -- Pharyngeal- Honey Cup -- Pharyngeal -- Pharyngeal- Nectar Teaspoon -- Pharyngeal -- Pharyngeal- Nectar Cup Reduced tongue base retraction;Pharyngeal residue - valleculae;Penetration/Aspiration before swallow Pharyngeal Material enters airway, CONTACTS cords and not ejected out Pharyngeal- Nectar Straw Reduced tongue base retraction;Pharyngeal residue - valleculae Pharyngeal -- Pharyngeal- Thin Teaspoon -- Pharyngeal -- Pharyngeal- Thin Cup Reduced tongue base retraction;Pharyngeal residue - valleculae;Penetration/Aspiration before swallow Pharyngeal Material enters airway, CONTACTS cords and not ejected out Pharyngeal- Thin Straw -- Pharyngeal -- Pharyngeal- Puree Reduced tongue base retraction;Pharyngeal residue - valleculae Pharyngeal -- Pharyngeal- Mechanical Soft Reduced tongue base retraction;Pharyngeal residue - valleculae Pharyngeal -- Pharyngeal- Regular -- Pharyngeal -- Pharyngeal- Multi-consistency -- Pharyngeal -- Pharyngeal- Pill -- Pharyngeal -- Pharyngeal Comment --  CHL IP CERVICAL ESOPHAGEAL PHASE 08/24/2017 Cervical Esophageal Phase WFL Pudding Teaspoon -- Pudding Cup -- Honey Teaspoon -- Honey Cup -- Nectar Teaspoon -- Nectar Cup -- Nectar  Straw -- Thin Teaspoon -- Thin Cup -- Thin Straw -- Puree -- Mechanical Soft -- Regular -- Multi-consistency -- Pill -- Cervical Esophageal Comment -- No flowsheet data found. Maxcine Ham 08/24/2017, 2:49 PM  Maxcine Ham, M.A. CCC-SLP [phone]               Scheduled Meds: . aspirin  300 mg Rectal Daily  . chlorhexidine  15 mL Mouth Rinse BID  . enoxaparin (LOVENOX) injection  40 mg Subcutaneous Q24H  . mouth rinse  15 mL Mouth Rinse q12n4p   Continuous Infusions: . dextrose 500 mL (08/25/17 0719)     LOS: 3 days   Time Spent in minutes   45 minutes  Jimmie Dattilio D.O. on 08/25/2017 at 12:08 PM  Between 7am to 7pm - Pager - [phone]  After 7pm go to [email or web address] - password [email or web address]  And look for the night coverage person covering for me after hours  [facility] Office  [phone]

## 2017-08-25 NOTE — Progress Notes (Signed)
STROKE TEAM PROGRESS NOTE   HISTORY OF PRESENT ILLNESS (per record) [name] [name] is a 66 y.o. male with no documented medical history presenting with right-sided weakness and expressive aphasia.  He has no home meds and intially [facility] with subacute L MCA infarct, and was [facility] out of concern for cerebral edema.  He is currently aphasic with right-sided weakness.  [name] [name] [facility] on 9/4 by EMS after being found on the ground, flaccid to the right side and laying in his own urine at his home. A family member went to check on him after not hearing from him for two days and found him this way. His lives with his elderly mother who he has been caring after she had a stroke. ER evaluation revealed a large subacute Left MCA CVA with 5 mm midline shift and hew as noted to be bradycardic down into the 30s.  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 He does not see doctors,  take any medications or have any known allergies. He was transferred by [facility] yesterday because of concern for brain swelling.    LKW: 9/2  Patient was not administered IV t-PA secondary to arriving outside of the treatment window. He was admitted to General Neurology for further evaluation and treatment.   SUBJECTIVE (INTERVAL HISTORY) No family at the bedside.  The patient is awake, alert, with expressive aphasia.  Persistent sinus brady on monitor with rate of 30-40 since arrival.     Cardiology consulted for persistent bradycardia,    OBJECTIVE Temp:  [96.2 F (35.7 C)-98 F (36.7 C)] 97.6 F (36.4 C) (09/07 1148) Pulse Rate:  [33-103] 34 (09/07 1500) Cardiac Rhythm: Sinus bradycardia (09/07 1328) Resp:  [8-24] 17 (09/07 1500) BP: (94-127)/(59-69) 117/67 (09/07 1500) SpO2:  [96 %-99 %] 98 % (09/07 1500)  CBC:  Recent Labs Lab 08/22/17 1612   08/23/17 0425 08/24/17 0102  WBC 15.4*  --  14.6* 10.9*  NEUTROABS 13.1*  --   --   --   HGB 17.2*  < > 15.2 13.5  HCT 50.6  < > 46.4 41.4  MCV 92.8  --  96.3 93.5  PLT 185  --  133* 134*  < > = values in this interval not displayed.  Basic Metabolic Panel:   Recent Labs Lab 08/24/17 0647 08/25/17 0709  NA 147* 144  K 3.9 4.6  CL 114* 115*  CO2 24 22  GLUCOSE 84 86  BUN 25* 22*  CREATININE 1.20 1.07  CALCIUM 8.5* 8.4*    Lipid Panel:     Component Value Date/Time   CHOL 149 08/23/2017 0425   TRIG 102 08/23/2017 0425   HDL 51 08/23/2017 0425   CHOLHDL 2.9 08/23/2017 0425   VLDL 20 08/23/2017 0425   LDLCALC 78 08/23/2017 0425   HgbA1c:  Lab Results  Component Value Date   HGBA1C 5.2 08/22/2017   Urine Drug Screen:     Component Value Date/Time   LABOPIA NONE DETECTED 08/22/2017 1624   COCAINSCRNUR NONE DETECTED 08/22/2017 1624   LABBENZ NONE DETECTED 08/22/2017 1624   AMPHETMU NONE DETECTED 08/22/2017 1624   THCU NONE DETECTED 08/22/2017 1624   LABBARB NONE DETECTED 08/22/2017 1624    Alcohol Level     Component Value Date/Time   ETH <5 08/22/2017 1612  IMAGING  Dg Chest 2 View 08/22/2017 IMPRESSION: No active cardiopulmonary disease.  Ct Head Wo Contrast IMPRESSION: 1. Acute large LEFT MCA territory infarct with LEFT basal ganglia suspected petechial hemorrhage. 3 mm LEFT-to-RIGHT midline shift without ventricular entrapment or hydrocephalus.  Mr Brain 64 Contrast Mr Maxine Glenn Head/brain Wo Cm 08/23/2017 IMPRESSION: Brain MRI: 1. Large acute left MCA territory infarct most confluent along the insula, opercula, and basal ganglia. No evidence of infarct progression compared to head CT yesterday. 2. Petechial hemorrhage is seen within the left striatum. 3. Cytotoxic edema causes 5 mm of midline shift. Intracranial MRA: 1. Distal left M1 apparent occlusion with flow gap. 2. Poor flow in the right PCA beginning at the P2 segment, presumably from underlying  advanced stenosis.  US Carotid Bilateral (at [facility] Only) 08/23/2017 IMPRESSION: Right: Color duplex indicates minimal heterogeneous and calcified plaque, with no hemodynamically significant stenosis by duplex criteria in the extracranial cerebrovascular circulation. Left: Color duplex indicates minimal heterogeneous and calcified plaque, with no hemodynamically significant stenosis by duplex criteria in the extracranial cerebrovascular circulation. Color duplex demonstrates changes suggesting ulcerated plaque at the carotid bifurcation.    TTE 08/23/2017 Study Conclusions - Left ventricle: The cavity size was normal. Wall thickness was   normal. Systolic function was normal. The estimated ejection   fraction was 55%. Wall motion was normal; there were no regional   wall motion abnormalities. Left ventricular diastolic function   parameters were normal. - Aortic valve: Trileaflet; mildly thickened leaflets. There was   trivial regurgitation. - Aorta: Mild aortic root dilatation. Aortic root dimension: 39 mm   (ED). - Mitral valve: There was mild regurgitation. - Left atrium: The atrium was mildly dilated. - Atrial septum: No defect or patent foramen ovale was identified. - Pulmonic valve: There was mild regurgitation.      PHYSICAL EXAM Pleasant caucasian male not in distress. . Afebrile. Head is nontraumatic. Neck is supple without bruit.    Cardiac exam no murmur or gallop. Lungs are clear to auscultation. Distal pulses are well felt. Neurological Exam :  Awake alert oriented 3. Expressive aphasia and can speak only a few words and occasional short sentences. Comprehension appears to be preserved. Difficulty with naming and repetition. Follows one and a few two-step commands. Extraocular movements are full range without nystagmus. Blinks to threat on the left and to lesser extent on the right. Pupils equal reactive. Fundi were not visualized. Moderate right lower facial weakness.  Tongue midline. Motor system exam reveals right hemiplegia with right upper extremity 3/5 strength with significant right hand and grip weakness. Mild right hip exam ankle dorsiflexor weakness. Sensation is symmetric bilaterally plantars are downgoing. Deep tendon reflexes are symmetric. Gait was not tested. ASSESSMENT/PLAN [name] [name] is a 66 y.o. male with no documented medical history presenting with right-sided weakness and expressive aphasia. He did not receive IV t-PA due to arriving outside of the tPA treatment window.   Stroke: Large acute left MCA territory infarct most confluent along the insula, opercula, and basal ganglia with petechial hemorrhage and cytotoxic edema with 5mm of midline shift, embolic, of undetermined etiology in the setting of persistent asymptomatic bradycardia.  Resultant  expressive aphasia and right hemiplegia  CT head: Acute large LEFT MCA territory infarct with LEFT basal ganglia suspected petechial hemorrhage and 3mm rightward midline shift.  MRI head: Large acute left MCA territory infarct most confluent along the insula, opercula, and basal ganglia with petechial hemorrhage and cytotoxic edema with 5mm of  midline shift  MRA head: L M1 occlusion, advanced R P2 stenosis  Carotid Doppler: B ICA 1-39% stenosis, VAs antegrade  2D Echo: EF 55%. No source of embolus   LDL 78  HgbA1c 5.2  Lovenox 40 mg sq daily for VTE prophylaxis DIET - DYS 1 Room service appropriate? Yes; Fluid consistency: Nectar Thick  No antithrombotic prior to admission, now on aspirin 300 mg suppository daily  Patient counseled to be compliant with his antithrombotic medications  Ongoing aggressive stroke risk factor management  Therapy recommendations:  pending  Disposition: pending  Persistent Bradycardia  Persistent rate in 40s, aymptomatic  Hemodynamically stable  Cardiology consulted, recommending EP consult for pacemaker  Hyperlipidemia  Home meds:  none  LDL 78, goal < 70  Pt currently NPO, will consider statin after pt passes swallow eval  Continue statin at discharge  Other Stroke Risk Factors  Family hx stroke (mother)  Other Active Problems  None  Hospital day # 3   TEE pending for monday Continue aspirin for stroke prevention.  If intracardiac clot may need anticoagulation. Transfer to rehab next week. Stroke team will sign off. Call for questions.  Delia Heady, MD Medical Director [facility] Pager: [phone] 08/25/2017 7:06 PM   To contact Stroke Continuity provider, please refer to [email or web address]. After hours, contact General Neurology

## 2017-08-25 NOTE — Progress Notes (Signed)
Progress Note  Patient Name: [name] CroakRichard P [name] Date of Encounter: 08/25/2017  Primary Cardiologist:   New (Dr. Antoine PocheHochrein)  Subjective   He is alert and not reporting pain.    Inpatient Medications    Scheduled Meds: . aspirin  300 mg Rectal Daily  . chlorhexidine  15 mL Mouth Rinse BID  . enoxaparin (LOVENOX) injection  40 mg Subcutaneous Q24H  . mouth rinse  15 mL Mouth Rinse q12n4p   Continuous Infusions: . dextrose 500 mL (08/25/17 0719)   PRN Meds: RESOURCE THICKENUP CLEAR, senna-docusate   Vital Signs    Vitals:   08/24/17 1600 08/24/17 1921 08/24/17 2248 08/25/17 0403  BP:  117/67 118/69 117/68  Pulse:  79 (!) 103 (!) 37  Resp:  (!) 24 19 (!) 8  Temp: 98 F (36.7 C) 97.9 F (36.6 C) 98 F (36.7 C) 98 F (36.7 C)  TempSrc: Oral Oral Oral Oral  SpO2:  96% 96% 99%  Weight:      Height:        Intake/Output Summary (Last 24 hours) at 08/25/17 0829 Last data filed at 08/25/17 0640  Gross per 24 hour  Intake             2880 ml  Output             1600 ml  Net             1280 ml   Filed Weights   08/23/17 0210 08/23/17 0500 08/23/17 2200  Weight: 173 lb 1 oz (78.5 kg) 173 lb 1 oz (78.5 kg) 178 lb 5.6 oz (80.9 kg)    Telemetry    SB, PACs - Personally Reviewed  ECG    N - Personally Reviewed  Physical Exam   GEN: No acute distress.   Neck: No  JVD Cardiac: RRR, no murmurs, rubs, or gallops.  Respiratory: Clear  to auscultation bilaterally. GI: Soft, nontender, non-distended  MS: No  edema; No deformity. Neuro:  Right sided hemiparesis.   Psych: Normal affect   Labs    Chemistry Recent Labs Lab 08/22/17 1612  08/23/17 1007 08/24/17 0102 08/24/17 0647  NA 146*  < > 147* 147* 147*  K 4.3  < > 4.2 3.7 3.9  CL 113*  < > 117* 116* 114*  CO2 21*  < > 22 25 24   GLUCOSE 117*  < > 99 84 84  BUN 45*  < > 34* 25* 25*  CREATININE 1.64*  < > 1.24 1.17 1.20  CALCIUM 9.3  < > 8.5* 8.4* 8.5*  PROT 8.2*  --  6.4*  --   --   ALBUMIN 4.1  --   3.1*  --   --   AST 47*  --  33  --   --   ALT 30  --  26  --   --   ALKPHOS 56  --  44  --   --   BILITOT 1.5*  --  1.3*  --   --   GFRNONAA 42*  < > 58* >60 >60  GFRAA 48*  < > >60 >60 >60  ANIONGAP 12  < > 8 6 9   < > = values in this interval not displayed.   Hematology Recent Labs Lab 08/22/17 1612 08/22/17 1624 08/23/17 0425 08/24/17 0102  WBC 15.4*  --  14.6* 10.9*  RBC 5.45  --  4.82 4.43  HGB 17.2* 17.7* 15.2 13.5  HCT 50.6 52.0 46.4 41.4  MCV 92.8  --  96.3 93.5  MCH 31.6  --  31.5 30.5  MCHC 34.0  --  32.8 32.6  RDW 13.4  --  13.6 13.4  PLT 185  --  133* 134*    Cardiac Enzymes Recent Labs Lab 08/22/17 2155 08/23/17 0425 08/23/17 1007  TROPONINI <0.03 <0.03 <0.03    Recent Labs Lab 08/22/17 1618  TROPIPOC 0.02     BNPNo results for input(s): BNP, PROBNP in the last 168 hours.   DDimer No results for input(s): DDIMER in the last 168 hours.   Radiology    Mr Brain Wo Contrast  Result Date: 08/23/2017 CLINICAL DATA:  Unresponsive.  Abnormal head CT. EXAM: MRI HEAD WITHOUT CONTRAST MRA HEAD WITHOUT CONTRAST TECHNIQUE: Multiplanar, multiecho pulse sequences of the brain and surrounding structures were obtained without intravenous contrast. Angiographic images of the head were obtained using MRA technique without contrast. COMPARISON:  Head CT from yesterday FINDINGS: MRI HEAD FINDINGS Brain: Large area of acute infarct in the left MCA territory affecting the insula, frontotemporal operculum, inferolateral frontal lobe, and posterior temporal/parietal cortex. Infarcted striatum and anterior limb internal capsule with basal ganglia petechial hemorrhage. Cytotoxic edema causes up to 5 mm of midline shift. Probable small remote left inferior cerebellar infarct. No hydrocephalus or masslike findings. Vascular: Arterial findings below. Normal dural venous sinus flow voids. Skull and upper cervical spine: Negative for marrow lesion Sinuses/Orbits: No acute finding. MRA  HEAD FINDINGS Symmetric carotid arteries that are patent. Bilateral P1 hypoplasia. Flow gap at the distal left M1 segment with under opacification of the less numerous left MCA branches. Accounting for artifact, no stenosis or branch occlusion seen within bilateral ACA and right MCA vessels. Small vertebrobasilar vessels in the setting of fetal type PCAs. There is asymmetric poor signal within the right PCA beginning at the P2 segment, where there may be an underlying severe stenosis. No aneurysm findings. IMPRESSION: Brain MRI: 1. Large acute left MCA territory infarct most confluent along the insula, opercula, and basal ganglia. No evidence of infarct progression compared to head CT yesterday. 2. Petechial hemorrhage is seen within the left striatum. 3. Cytotoxic edema causes 5 mm of midline shift. Intracranial MRA: 1. Distal left M1 apparent occlusion with flow gap. 2. Poor flow in the right PCA beginning at the P2 segment, presumably from underlying advanced stenosis. Electronically Signed   By: Marnee Spring M.D.   On: 08/23/2017 10:22   US Carotid Bilateral (at [facility] And [facility] Only)  Result Date: 08/23/2017 CLINICAL DATA:  66 year old male with a history of altered mental status. Cardiovascular risk factors include stroke/TIA EXAM: BILATERAL CAROTID DUPLEX ULTRASOUND TECHNIQUE: Wallace Cullens scale imaging, color Doppler and duplex ultrasound were performed of bilateral carotid and vertebral arteries in the neck. COMPARISON:  No prior duplex FINDINGS: Criteria: Quantification of carotid stenosis is based on velocity parameters that correlate the residual internal carotid diameter with NASCET-based stenosis levels, using the diameter of the distal internal carotid lumen as the denominator for stenosis measurement. The following velocity measurements were obtained: RIGHT ICA:  Systolic 72 cm/sec, Diastolic 28 cm/sec CCA:  91 cm/sec SYSTOLIC ICA/CCA RATIO:  0.8 ECA:  80 cm/sec LEFT ICA:  Systolic 62 cm/sec, Diastolic 15  cm/sec CCA:  90 cm/sec SYSTOLIC ICA/CCA RATIO:  0.7 ECA:  77 cm/sec Right Brachial SBP: Not acquired Left Brachial SBP: Not acquired RIGHT CAROTID ARTERY: No significant calcifications of the right common carotid artery. Intermediate waveform maintained. Heterogeneous and partially calcified plaque at the right carotid  bifurcation. No significant lumen shadowing. Low resistance waveform of the right ICA. Mild tortuosity RIGHT VERTEBRAL ARTERY: Antegrade flow with low resistance waveform. LEFT CAROTID ARTERY: No significant calcifications of the left common carotid artery. Intermediate waveform maintained. Heterogeneous and partially calcified plaque at the left carotid bifurcation without significant lumen shadowing. Low resistance waveform of the left ICA. Duplex demonstrates color signal within the plaque of the far field wall at the bifurcation. Mild tortuosity LEFT VERTEBRAL ARTERY:  Antegrade flow with low resistance waveform. IMPRESSION: Right: Color duplex indicates minimal heterogeneous and calcified plaque, with no hemodynamically significant stenosis by duplex criteria in the extracranial cerebrovascular circulation. Left: Color duplex indicates minimal heterogeneous and calcified plaque, with no hemodynamically significant stenosis by duplex criteria in the extracranial cerebrovascular circulation. Color duplex demonstrates changes suggesting ulcerated plaque at the carotid bifurcation. Signed, Yvone Neu. Loreta Ave, DO [facility] Radiology Electronically Signed   By: Gilmer Mor D.O.   On: 08/23/2017 14:26   Dg Swallowing Func-speech Pathology  Result Date: 08/24/2017 Objective Swallowing Evaluation: Type of Study: MBS-Modified Barium Swallow Study Patient Details Name: OWEN PRATTE MRN: [id number] Date of Birth: 1951-08-29 Today's Date: 08/24/2017 Time: SLP Start Time (ACUTE ONLY): 1258-SLP Stop Time (ACUTE ONLY): 1316 SLP Time Calculation (min) (ACUTE  ONLY): 18 min Past Medical History: No past medical history on file. Past Surgical History: No past surgical history on file. HPI: Ptis a 67 [facility] with a large subacute MCA infarct and 5 mm midline shift. He was transferred to [facility] out of concern for swelling. No significant PMH is known. Subjective: pt alert but nonverbal Assessment / Plan / Recommendation CHL IP CLINICAL IMPRESSIONS 08/24/2017 Clinical Impression Pt has a moderate oral and mild pharyngeal dysphagia due to right-sided weakness and sensory deficits. 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 He also has disorganized bolus formation and slow transit, with R sided pocketing that is most prevalent with soft solids as almost the entire bolus remains in his buccal cavity until it is manually removed by SLP. His pharyngeal phase is grossly functional except for mild base of tongue weakness that allows for some vallecular residue; however, his poor oral control also results in intermittent premature spillage that causes silent penetration to the true vocal folds with thin and nectar thick liquids. When he consumed nectar thick liquids by straw, his oral containment was improved, even with challenging. Recommend initiation of Dys 1 diet and nectar thick liquids by straw.  SLP Visit Diagnosis Dysphagia, oropharyngeal phase (R13.12) Attention and concentration deficit following -- Frontal lobe and executive function deficit following -- Impact on safety and function Mild aspiration risk;Moderate aspiration risk   CHL IP TREATMENT RECOMMENDATION 08/24/2017 Treatment Recommendations Therapy as outlined in treatment plan below   Prognosis 08/24/2017 Prognosis for Safe Diet Advancement Good Barriers to Reach Goals Language deficits Barriers/Prognosis Comment -- CHL IP DIET RECOMMENDATION 08/24/2017 SLP Diet Recommendations Dysphagia 1 (Puree) solids;Nectar  thick liquid Liquid Administration via Straw only (NO cup sips) Medication Administration Crushed with puree Compensations Slow rate;Small sips/bites;Lingual sweep for clearance of pocketing;Monitor for anterior loss Postural Changes Seated upright at 90 degrees   CHL IP OTHER RECOMMENDATIONS 08/24/2017 Recommended Consults -- Oral Care Recommendations Oral care BID Other Recommendations Have oral suction available;Order thickener from pharmacy;Prohibited food (jello, ice cream, thin soups);Remove water pitcher   CHL IP FOLLOW UP RECOMMENDATIONS 08/24/2017 Follow up Recommendations  Inpatient Rehab   CHL IP FREQUENCY AND DURATION 08/24/2017 Speech Therapy Frequency (ACUTE ONLY) min 2x/week Treatment Duration 2 weeks      CHL IP ORAL PHASE 08/24/2017 Oral Phase Impaired Oral - Pudding Teaspoon -- Oral - Pudding Cup -- Oral - Honey Teaspoon -- Oral - Honey Cup -- Oral - Nectar Teaspoon -- Oral - Nectar Cup Right anterior bolus loss;Weak lingual manipulation;Reduced posterior propulsion;Delayed oral transit;Decreased bolus cohesion;Premature spillage Oral - Nectar Straw Weak lingual manipulation;Reduced posterior propulsion;Delayed oral transit;Decreased bolus cohesion;Premature spillage Oral - Thin Teaspoon -- Oral - Thin Cup Right anterior bolus loss;Weak lingual manipulation;Reduced posterior propulsion;Delayed oral transit;Decreased bolus cohesion;Premature spillage Oral - Thin Straw -- Oral - Puree Right anterior bolus loss;Weak lingual manipulation;Reduced posterior propulsion;Delayed oral transit;Decreased bolus cohesion;Right pocketing in lateral sulci Oral - Mech Soft Right anterior bolus loss;Weak lingual manipulation;Reduced posterior propulsion;Delayed oral transit;Decreased bolus cohesion;Premature spillage;Right pocketing in lateral sulci Oral - Regular -- Oral - Multi-Consistency -- Oral - Pill -- Oral Phase - Comment --  CHL IP PHARYNGEAL PHASE 08/24/2017 Pharyngeal Phase Impaired Pharyngeal- Pudding Teaspoon --  Pharyngeal -- Pharyngeal- Pudding Cup -- Pharyngeal -- Pharyngeal- Honey Teaspoon -- Pharyngeal -- Pharyngeal- Honey Cup -- Pharyngeal -- Pharyngeal- Nectar Teaspoon -- Pharyngeal -- Pharyngeal- Nectar Cup Reduced tongue base retraction;Pharyngeal residue - valleculae;Penetration/Aspiration before swallow Pharyngeal Material enters airway, CONTACTS cords and not ejected out Pharyngeal- Nectar Straw Reduced tongue base retraction;Pharyngeal residue - valleculae Pharyngeal -- Pharyngeal- Thin Teaspoon -- Pharyngeal -- Pharyngeal- Thin Cup Reduced tongue base retraction;Pharyngeal residue - valleculae;Penetration/Aspiration before swallow Pharyngeal Material enters airway, CONTACTS cords and not ejected out Pharyngeal- Thin Straw -- Pharyngeal -- Pharyngeal- Puree Reduced tongue base retraction;Pharyngeal residue - valleculae Pharyngeal -- Pharyngeal- Mechanical Soft Reduced tongue base retraction;Pharyngeal residue - valleculae Pharyngeal -- Pharyngeal- Regular -- Pharyngeal -- Pharyngeal- Multi-consistency -- Pharyngeal -- Pharyngeal- Pill -- Pharyngeal -- Pharyngeal Comment --  CHL IP CERVICAL ESOPHAGEAL PHASE 08/24/2017 Cervical Esophageal Phase WFL Pudding Teaspoon -- Pudding Cup -- Honey Teaspoon -- Honey Cup -- Nectar Teaspoon -- Nectar Cup -- Nectar Straw -- Thin Teaspoon -- Thin Cup -- Thin Straw -- Puree -- Mechanical Soft -- Regular -- Multi-consistency -- Pill -- Cervical Esophageal Comment -- No flowsheet data found. Maxcine Ham 08/24/2017, 2:49 PM  Maxcine Ham, M.A. CCC-SLP [phone]             Mr Maxine Glenn Head/brain UJ Cm  Result Date: 08/23/2017 CLINICAL DATA:  Unresponsive.  Abnormal head CT. EXAM: MRI HEAD WITHOUT CONTRAST MRA HEAD WITHOUT CONTRAST TECHNIQUE: Multiplanar, multiecho pulse sequences of the brain and surrounding structures were obtained without intravenous contrast. Angiographic images of the head were obtained using MRA technique without contrast. COMPARISON:  Head CT from  yesterday FINDINGS: MRI HEAD FINDINGS Brain: Large area of acute infarct in the left MCA territory affecting the insula, frontotemporal operculum, inferolateral frontal lobe, and posterior temporal/parietal cortex. Infarcted striatum and anterior limb internal capsule with basal ganglia petechial hemorrhage. Cytotoxic edema causes up to 5 mm of midline shift. Probable small remote left inferior cerebellar infarct. No hydrocephalus or masslike findings. Vascular: Arterial findings below. Normal dural venous sinus flow voids. Skull and upper cervical spine: Negative for marrow lesion Sinuses/Orbits: No acute finding. MRA HEAD FINDINGS Symmetric carotid arteries that are patent. Bilateral P1 hypoplasia. Flow gap at the distal left M1 segment with under opacification of the less numerous left MCA branches. Accounting for artifact, no stenosis or branch occlusion seen within bilateral ACA and right MCA vessels. Small vertebrobasilar vessels  in the setting of fetal type PCAs. There is asymmetric poor signal within the right PCA beginning at the P2 segment, where there may be an underlying severe stenosis. No aneurysm findings. IMPRESSION: Brain MRI: 1. Large acute left MCA territory infarct most confluent along the insula, opercula, and basal ganglia. No evidence of infarct progression compared to head CT yesterday. 2. Petechial hemorrhage is seen within the left striatum. 3. Cytotoxic edema causes 5 mm of midline shift. Intracranial MRA: 1. Distal left M1 apparent occlusion with flow gap. 2. Poor flow in the right PCA beginning at the P2 segment, presumably from underlying advanced stenosis. Electronically Signed   By: Marnee Spring M.D.   On: 08/23/2017 10:22    Cardiac Studies   ECHO:  Study Conclusions  - Left ventricle: The cavity size was normal. Wall thickness was   normal. Systolic function was normal. The estimated ejection   fraction was 55%. Wall motion was normal; there were no regional   wall  motion abnormalities. Left ventricular diastolic function   parameters were normal. - Aortic valve: Trileaflet; mildly thickened leaflets. There was   trivial regurgitation. - Aorta: Mild aortic root dilatation. Aortic root dimension: 39 mm   (ED). - Mitral valve: There was mild regurgitation. - Left atrium: The atrium was mildly dilated. - Atrial septum: No defect or patent foramen ovale was identified. - Pulmonic valve: There was mild regurgitation.  Patient Profile     66 y.o. male  with a hx of no medical problems who is being seen for the evaluation of bradycardia at the request of Dr. Catha Gosselin.  Assessment & Plan    BRADYCARDIA:    He has bradycardia but increases his heart rate when he is awake and active.  No indication for pacing.  OK for any PT/OT.    CVA:  TEE on Monday.    Signed, Rollene Rotunda, MD  08/25/2017, 8:29 AM

## 2017-08-25 NOTE — Progress Notes (Signed)
PT Cancellation Note  Patient Details Name: Mia CreekRichard P Dobis MRN: [id number] DOB: 04/07/1951   Cancelled Treatment:    Reason Eval/Treat Not Completed: Medical issues which prohibited therapy pt continues to be on bedrest. Will await increase in activity orders prior to PT evaluation.   Blake DivineShauna A Naw Lasala 08/25/2017, 8:10 AM Mylo RedShauna Cienna Dumais, PT, DPT [phone]

## 2017-08-25 NOTE — Progress Notes (Signed)
Patient's TEE reschedule for Monday, September 10th @ 1000.

## 2017-08-25 NOTE — Progress Notes (Signed)
  Speech Language Pathology Treatment: Dysphagia;Cognitive-Linquistic  Patient Details Name: [name] CreekRichard P [name] MRN: [id number] DOB: 09/19/1951 Today's Date: 08/25/2017 Time: [id number] SLP Time Calculation (min) (ACUTE ONLY): 20 min  Assessment / Plan / Recommendation Clinical Impression  Pt is following commands with more consistency today, requiring only Min cues. He still does not have any verbal output despite Max multimodal cueing during automatic speech tasks, repetition trials, and use of music. He consumed Dys 1 textures and nectar thick liquids with Mod cues for slower pacing to facilitate oral containment. Right-sided anterior loss was observed when taking larger boluses, which also becomes more concerning for posterior loss. Verbal and tactile cueing was effective at slowing his pace and bite/sip size. Would continue current diet.   HPI HPI: Ptis a 66 y.o.male[facility] with a large subacute MCA infarct and 5 mm midline shift. [facility]. No significant PMH is known.      SLP Plan  Continue with current plan of care       Recommendations  Diet recommendations: Dysphagia 1 (puree);Nectar-thick liquid Liquids provided via: Straw Medication Administration: Crushed with puree Supervision: Patient able to self feed;Full supervision/cueing for compensatory strategies Compensations: Slow rate;Small sips/bites;Lingual sweep for clearance of pocketing;Monitor for anterior loss Postural Changes and/or Swallow Maneuvers: Seated upright 90 degrees;Upright 30-60 min after meal                Oral Care Recommendations: Oral care BID Follow up Recommendations: Inpatient Rehab SLP Visit Diagnosis: Dysphagia, oropharyngeal phase (R13.12);Aphasia (R47.01) Plan: Continue with current plan of care       GO                [name], [name] 08/25/2017, 12:22 PM  [name], M.A. CCC-SLP [phone]

## 2017-08-25 NOTE — Evaluation (Signed)
Occupational Therapy Evaluation Patient Details Name: [name] CreekRichard P [name] MRN: [id number] DOB: 12/19/1950 Today's Date: 08/25/2017    History of Present Illness Patient is a 66 y/o male who was found down at home covered in urine. 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 No PMH as pt has not been to the doctor.   Clinical Impression   Pt was independent prior to admission. Presents with dense R hemiparesis and neglect and aphasia. Pt is non verbal, but communicated with some gestures. Yes/no response was not always reliable. He requires max to total assist for ADL and 2 person assist for all mobility. Pt tolerated activity well with VS stable throughout session. Pt will need extensive rehab. Recommending [name]. Will follow.    Follow Up Recommendations  [name]    Equipment Recommendations   (defer to next venue)    Recommendations for Other Services       Precautions / Restrictions Precautions Precautions: Fall Restrictions Weight Bearing Restrictions: No      Mobility Bed Mobility Overal bed mobility: Needs Assistance Bed Mobility: Rolling;Sidelying to Sit Rolling: Min guard Sidelying to sit: HOB elevated;Mod assist       General bed mobility comments: Step by step cues for technique and to use LUE to reach towards rail; assist with elevating trunk and to scoot bottom to EOB.  Transfers Overall transfer level: Needs assistance Equipment used: 2 person hand held assist Transfers: Sit to/from UGI CorporationStand;Stand Pivot Transfers Sit to Stand: Mod assist;+2 physical assistance;From elevated surface Stand pivot transfers: Max assist;+2 physical assistance       General transfer comment: Assist of 2 to stand from EOB with cues for anterior weight shift, stood from EOB x2. Cues for upright posture. Right knee instability requiring support esp with weight shifting and stepping. Max A of 2 for SPT to chair towards left side; pt not able to stand fully upright and  trying to squat pivot.    Balance Overall balance assessment: Needs assistance Sitting-balance support: Feet supported;No upper extremity supported Sitting balance-Leahy Scale: Fair Sitting balance - Comments: Able to sit unsupported EOB. Pt able to scoot bottom to EOB with Min A with posterior LOB but able to recover.    Standing balance support: During functional activity Standing balance-Leahy Scale: Poor Standing balance comment: Requires Min-Mod A for standing balance; attempted to pick up feet for stepping and right knee buckling.                           ADL either performed or assessed with clinical judgement   ADL Overall ADL's : Needs assistance/impaired Eating/Feeding: Sitting;Minimal assistance Eating/Feeding Details (indicate cue type and reason): drinking only observed Grooming: Brushing hair;Sitting;Moderate assistance   Upper Body Bathing: Maximal assistance;Sitting   Lower Body Bathing: Total assistance;+2 for physical assistance;Sit to/from stand   Upper Body Dressing : Maximal assistance;Sitting Upper Body Dressing Details (indicate cue type and reason): began instruction in hemitechniques Lower Body Dressing: Total assistance;+2 for physical assistance;Sit to/from stand   Toilet Transfer: +2 for physical assistance;Maximal assistance;Stand-pivot   Toileting- Clothing Manipulation and Hygiene: +2 for physical assistance;Total assistance;Sit to/from stand               Vision Baseline Vision/History: No visual deficits       Perception     Praxis      Pertinent Vitals/Pain Pain Assessment: Faces Faces Pain Scale: No hurt     Hand Dominance Right  Extremity/Trunk Assessment Upper Extremity Assessment Upper Extremity Assessment: RUE deficits/detail RUE Deficits / Details: Flaccid RUE with minor shoulder activation, max assist to locate R UE with his L during mobility or to position on pillow RUE Sensation: decreased  proprioception RUE Coordination: decreased fine motor;decreased gross motor   Lower Extremity Assessment Lower Extremity Assessment: Defer to PT evaluation RLE Deficits / Details: Grossly ~2+/5 knee extension, hip flexion, 1/5 DF.  RLE Sensation:  (WFL per pt report however not sure of accuracy due to aphasia.) RLE Coordination: decreased fine motor;decreased gross motor   Cervical / Trunk Assessment Cervical / Trunk Assessment: Normal   Communication Communication Communication: Receptive difficulties;Expressive difficulties   Cognition Arousal/Alertness: Awake/alert Behavior During Therapy: Flat affect (seemed visibly frustrated) Overall Cognitive Status: Difficult to assess Area of Impairment: Orientation;Following commands;Attention                 Orientation Level: Disoriented to;Place Current Attention Level: Sustained   Following Commands: Follows multi-step commands inconsistently;Follows one step commands consistently;Follows one step commands with increased time       General Comments: yes/no is not reliable   General Comments  HR up to 60 bpm during session but sustained in 40s.    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         OT Problem List: Decreased strength;Decreased activity tolerance;Decreased range of motion;Impaired  balance (sitting and/or standing);Decreased coordination;Decreased cognition;Decreased safety awareness;Decreased knowledge of use of DME or AE;Cardiopulmonary status limiting activity;Impaired UE functional use      OT Treatment/Interventions: Self-care/ADL training;Neuromuscular education;DME and/or AE instruction;Balance training;Patient/family education;Therapeutic activities    OT Goals(Current goals can be found in the care plan section) Acute Rehab OT Goals Patient Stated Goal: none stated but most likely to return to PLOF OT Goal Formulation: With patient Time For Goal Achievement: 09/08/17 Potential to Achieve Goals: Good ADL Goals Pt Will Perform Eating: with set-up;sitting (with L hand) Pt Will Perform Grooming: with set-up;sitting Pt Will Perform Upper Body Bathing: with min assist;sitting Pt Will Perform Upper Body Dressing: with min assist;sitting Pt Will Transfer to Toilet: with mod assist;stand pivot transfer;bedside commode Pt/caregiver will Perform Home Exercise Program: Increased ROM;Increased strength;Right Upper extremity;With minimal assist Additional ADL Goal #1: Pt will perform bed mobility with moderate assist in preparation for ADL.  OT Frequency: Min 3X/week   Barriers to D/C: Decreased caregiver support          Co-evaluation   Reason for Co-Treatment: Necessary to address cognition/behavior during functional activity;To address functional/ADL transfers;For patient/therapist safety PT goals addressed during session: Mobility/safety with mobility        AM-PAC PT "6 Clicks" Daily Activity     Outcome Measure Help from another person eating meals?: A Little Help from another person taking care of personal grooming?: A Little Help from another person toileting, which includes using toliet,  bedpan, or urinal?: Total Help from another person bathing (including washing, rinsing, drying)?: Total Help from another person to put on and taking off regular upper  body clothing?: A Lot Help from another person to put on and taking off regular lower body clothing?: Total 6 Click Score: 11   End of Session Equipment Utilized During Treatment: Gait belt Nurse Communication: Mobility status  Activity Tolerance: Patient tolerated treatment well Patient left: in chair;with call bell/phone within reach;with chair alarm set  OT Visit Diagnosis: Unsteadiness on feet (R26.81);Cognitive communication deficit (R41.841);Hemiplegia and hemiparesis Symptoms and signs involving cognitive functions: Cerebral infarction Hemiplegia - Right/Left: Right Hemiplegia - dominant/non-dominant: Dominant Hemiplegia - caused by: Cerebral infarction                Time: 1610-9604 OT Time Calculation (min): 26 min Charges:  OT General Charges $OT Visit: 1 Visit OT Evaluation $OT Eval Moderate Complexity: 1 Mod G-Codes:     09/06/17 Martie Round, OTR/L Pager: [phone] [place] Planas, [place] Bailiff 2017-09-06, 2:02 PM

## 2017-08-26 ENCOUNTER — Inpatient Hospital Stay (HOSPITAL_COMMUNITY): Payer: Medicare Other

## 2017-08-26 ENCOUNTER — Encounter (HOSPITAL_COMMUNITY): Payer: Self-pay

## 2017-08-26 ENCOUNTER — Inpatient Hospital Stay (HOSPITAL_COMMUNITY): Payer: Medicare Other | Admitting: Certified Registered"

## 2017-08-26 ENCOUNTER — Encounter (HOSPITAL_COMMUNITY)
Admission: EM | Disposition: A | Discharge status: Skilled Nursing Facility | Payer: Self-pay | Source: Home / Self Care | Attending: Internal Medicine

## 2017-08-26 DIAGNOSIS — I639 Cerebral infarction, unspecified: Secondary | ICD-10-CM

## 2017-08-26 DIAGNOSIS — I998 Other disorder of circulatory system: Secondary | ICD-10-CM

## 2017-08-26 DIAGNOSIS — I739 Peripheral vascular disease, unspecified: Secondary | ICD-10-CM

## 2017-08-26 DIAGNOSIS — R4182 Altered mental status, unspecified: Secondary | ICD-10-CM

## 2017-08-26 HISTORY — PX: FEMORAL-POPLITEAL BYPASS GRAFT: SHX937

## 2017-08-26 LAB — ABO/RH: ABO/RH(D): A POS

## 2017-08-26 LAB — URINE CULTURE: Culture: NO GROWTH

## 2017-08-26 LAB — PREPARE RBC (CROSSMATCH)

## 2017-08-26 LAB — GLUCOSE, CAPILLARY: GLUCOSE-CAPILLARY: 92 mg/dL (ref 65–99)

## 2017-08-26 SURGERY — BYPASS GRAFT FEMORAL-POPLITEAL ARTERY
Anesthesia: General | Laterality: Bilateral

## 2017-08-26 MED ORDER — THROMBIN 20000 UNITS EX KIT
PACK | CUTANEOUS | Status: AC
  Filled 2017-08-26: qty 1

## 2017-08-26 MED ORDER — SUCCINYLCHOLINE CHLORIDE 200 MG/10ML IV SOSY
PREFILLED_SYRINGE | INTRAVENOUS | Status: AC
  Filled 2017-08-26: qty 10

## 2017-08-26 MED ORDER — POTASSIUM CHLORIDE CRYS ER 20 MEQ PO TBCR: 20.0000 meq | EXTENDED_RELEASE_TABLET | Freq: Every day | ORAL | Status: DC | PRN

## 2017-08-26 MED ORDER — HYDRALAZINE HCL 20 MG/ML IJ SOLN: 5.0000 mg | INTRAMUSCULAR | Status: DC | PRN

## 2017-08-26 MED ORDER — PROMETHAZINE HCL 25 MG/ML IJ SOLN: 6.2500 mg | INTRAMUSCULAR | Status: DC | PRN

## 2017-08-26 MED ORDER — IOPAMIDOL (ISOVUE-370) INJECTION 76%
INTRAVENOUS | Status: AC
  Administered 2017-08-26: 100 mL
  Filled 2017-08-26: qty 100

## 2017-08-26 MED ORDER — ONDANSETRON HCL 4 MG/2ML IJ SOLN
INTRAMUSCULAR | Status: AC
  Filled 2017-08-26: qty 2

## 2017-08-26 MED ORDER — ROCURONIUM BROMIDE 10 MG/ML (PF) SYRINGE
PREFILLED_SYRINGE | INTRAVENOUS | Status: AC
  Filled 2017-08-26: qty 5

## 2017-08-26 MED ORDER — PROPOFOL 10 MG/ML IV BOLUS
INTRAVENOUS | Status: AC
  Filled 2017-08-26: qty 20

## 2017-08-26 MED ORDER — DOCUSATE SODIUM 100 MG PO CAPS
100.0000 mg | ORAL_CAPSULE | Freq: Every day | ORAL | Status: DC
  Administered 2017-08-27 – 2017-09-05 (×9): 100 mg via ORAL
  Filled 2017-08-26 (×10): qty 1

## 2017-08-26 MED ORDER — PROPOFOL 10 MG/ML IV BOLUS
INTRAVENOUS | Status: DC | PRN
  Administered 2017-08-26: 150 mg via INTRAVENOUS

## 2017-08-26 MED ORDER — MIDAZOLAM HCL 2 MG/2ML IJ SOLN
INTRAMUSCULAR | Status: AC
  Filled 2017-08-26: qty 2

## 2017-08-26 MED ORDER — PHENYLEPHRINE 40 MCG/ML (10ML) SYRINGE FOR IV PUSH (FOR BLOOD PRESSURE SUPPORT)
PREFILLED_SYRINGE | INTRAVENOUS | Status: AC
  Filled 2017-08-26: qty 10

## 2017-08-26 MED ORDER — HEPARIN (PORCINE) IN NACL 100-0.45 UNIT/ML-% IJ SOLN
1400.0000 [IU]/h | INTRAMUSCULAR | Status: DC
  Administered 2017-08-27: 1050 [IU]/h via INTRAVENOUS
  Administered 2017-08-28: 1300 [IU]/h via INTRAVENOUS
  Administered 2017-08-29 – 2017-08-30 (×2): 1450 [IU]/h via INTRAVENOUS
  Filled 2017-08-26 (×6): qty 250

## 2017-08-26 MED ORDER — 0.9 % SODIUM CHLORIDE (POUR BTL) OPTIME
TOPICAL | Status: DC | PRN
  Administered 2017-08-26: 2000 mL

## 2017-08-26 MED ORDER — DEXTROSE-NACL 5-0.45 % IV SOLN
INTRAVENOUS | Status: DC
  Administered 2017-08-27: 09:00:00 via INTRAVENOUS

## 2017-08-26 MED ORDER — HEPARIN SODIUM (PORCINE) 1000 UNIT/ML IJ SOLN
INTRAMUSCULAR | Status: DC | PRN
  Administered 2017-08-26: 8000 [IU] via INTRAVENOUS

## 2017-08-26 MED ORDER — ONDANSETRON HCL 4 MG/2ML IJ SOLN
INTRAMUSCULAR | Status: DC | PRN
  Administered 2017-08-26: 4 mg via INTRAVENOUS

## 2017-08-26 MED ORDER — PHENYLEPHRINE HCL 10 MG/ML IJ SOLN
INTRAVENOUS | Status: DC | PRN
  Administered 2017-08-26: 20 ug/min via INTRAVENOUS

## 2017-08-26 MED ORDER — ROCURONIUM BROMIDE 100 MG/10ML IV SOLN
INTRAVENOUS | Status: DC | PRN
  Administered 2017-08-26: 50 mg via INTRAVENOUS
  Administered 2017-08-26: 20 mg via INTRAVENOUS
  Administered 2017-08-26 (×2): 10 mg via INTRAVENOUS

## 2017-08-26 MED ORDER — PANTOPRAZOLE SODIUM 40 MG PO TBEC
40.0000 mg | DELAYED_RELEASE_TABLET | Freq: Every day | ORAL | Status: DC
  Administered 2017-08-27 – 2017-09-01 (×5): 40 mg via ORAL
  Filled 2017-08-26 (×6): qty 1

## 2017-08-26 MED ORDER — DEXAMETHASONE SODIUM PHOSPHATE 10 MG/ML IJ SOLN
INTRAMUSCULAR | Status: AC
  Filled 2017-08-26: qty 1

## 2017-08-26 MED ORDER — EPHEDRINE 5 MG/ML INJ
INTRAVENOUS | Status: AC
  Filled 2017-08-26: qty 10

## 2017-08-26 MED ORDER — HEPARIN (PORCINE) IN NACL 100-0.45 UNIT/ML-% IJ SOLN
1150.0000 [IU]/h | INTRAMUSCULAR | Status: DC
  Administered 2017-08-26 (×2): 1150 [IU]/h via INTRAVENOUS
  Filled 2017-08-26: qty 250

## 2017-08-26 MED ORDER — METOPROLOL TARTRATE 5 MG/5ML IV SOLN
2.0000 mg | INTRAVENOUS | Status: DC | PRN
  Filled 2017-08-26: qty 5

## 2017-08-26 MED ORDER — ACETAMINOPHEN 650 MG RE SUPP: 325.0000 mg | RECTAL | Status: DC | PRN

## 2017-08-26 MED ORDER — LABETALOL HCL 5 MG/ML IV SOLN: 10.0000 mg | INTRAVENOUS | Status: DC | PRN

## 2017-08-26 MED ORDER — LIDOCAINE HCL (CARDIAC) 20 MG/ML IV SOLN
INTRAVENOUS | Status: DC | PRN
  Administered 2017-08-26: 60 mg via INTRAVENOUS

## 2017-08-26 MED ORDER — ONDANSETRON HCL 4 MG/2ML IJ SOLN
4.0000 mg | Freq: Four times a day (QID) | INTRAMUSCULAR | Status: DC | PRN
  Administered 2017-08-27: 4 mg via INTRAVENOUS
  Filled 2017-08-26: qty 2

## 2017-08-26 MED ORDER — ASPIRIN 325 MG PO TABS
325.0000 mg | ORAL_TABLET | Freq: Every day | ORAL | Status: DC
  Administered 2017-08-26 – 2017-09-05 (×10): 325 mg via ORAL
  Filled 2017-08-26 (×10): qty 1

## 2017-08-26 MED ORDER — FENTANYL CITRATE (PF) 100 MCG/2ML IJ SOLN
INTRAMUSCULAR | Status: DC | PRN
  Administered 2017-08-26 (×4): 50 ug via INTRAVENOUS

## 2017-08-26 MED ORDER — ACETAMINOPHEN 325 MG PO TABS
325.0000 mg | ORAL_TABLET | ORAL | Status: DC | PRN
  Administered 2017-08-27 – 2017-08-29 (×2): 650 mg via ORAL
  Filled 2017-08-26 (×2): qty 2

## 2017-08-26 MED ORDER — HYDROMORPHONE HCL 1 MG/ML IJ SOLN: 0.2500 mg | INTRAMUSCULAR | Status: DC | PRN

## 2017-08-26 MED ORDER — SODIUM CHLORIDE 0.9 % IV SOLN
INTRAVENOUS | Status: DC | PRN
  Administered 2017-08-26: 500 mL

## 2017-08-26 MED ORDER — MIDAZOLAM HCL 5 MG/5ML IJ SOLN
INTRAMUSCULAR | Status: DC | PRN
  Administered 2017-08-26 (×2): 1 mg via INTRAVENOUS

## 2017-08-26 MED ORDER — ALUM & MAG HYDROXIDE-SIMETH 200-200-20 MG/5ML PO SUSP: 15.0000 mL | ORAL | Status: DC | PRN

## 2017-08-26 MED ORDER — OXYCODONE HCL 5 MG/5ML PO SOLN: 5.0000 mg | Freq: Once | ORAL | Status: DC | PRN

## 2017-08-26 MED ORDER — DEXTROSE 5 % IV SOLN
1.5000 g | Freq: Two times a day (BID) | INTRAVENOUS | Status: AC
  Administered 2017-08-27 (×2): 1.5 g via INTRAVENOUS
  Filled 2017-08-26 (×2): qty 1.5

## 2017-08-26 MED ORDER — SUGAMMADEX SODIUM 200 MG/2ML IV SOLN
INTRAVENOUS | Status: DC | PRN
  Administered 2017-08-26: 200 mg via INTRAVENOUS

## 2017-08-26 MED ORDER — HEPARIN SODIUM (PORCINE) 1000 UNIT/ML IJ SOLN
INTRAMUSCULAR | Status: AC
  Filled 2017-08-26: qty 1

## 2017-08-26 MED ORDER — DEXTROSE 5 % IV SOLN
1.5000 g | INTRAVENOUS | Status: AC
  Administered 2017-08-26: 1.5 g via INTRAVENOUS
  Filled 2017-08-26 (×2): qty 1.5

## 2017-08-26 MED ORDER — EPHEDRINE SULFATE-NACL 50-0.9 MG/10ML-% IV SOSY
PREFILLED_SYRINGE | INTRAVENOUS | Status: DC | PRN
  Administered 2017-08-26: 5 mg via INTRAVENOUS
  Administered 2017-08-26: 10 mg via INTRAVENOUS
  Administered 2017-08-26: 5 mg via INTRAVENOUS

## 2017-08-26 MED ORDER — GUAIFENESIN-DM 100-10 MG/5ML PO SYRP: 15.0000 mL | ORAL_SOLUTION | ORAL | Status: DC | PRN

## 2017-08-26 MED ORDER — DEXAMETHASONE SODIUM PHOSPHATE 4 MG/ML IJ SOLN
INTRAMUSCULAR | Status: DC | PRN
  Administered 2017-08-26: 10 mg via INTRAVENOUS

## 2017-08-26 MED ORDER — THROMBIN 20000 UNITS EX SOLR
CUTANEOUS | Status: AC
  Filled 2017-08-26: qty 20000

## 2017-08-26 MED ORDER — PHENOL 1.4 % MT LIQD: 1.0000 | OROMUCOSAL | Status: DC | PRN

## 2017-08-26 MED ORDER — SODIUM CHLORIDE 0.9 % IV SOLN
Freq: Once | INTRAVENOUS | Status: AC
  Administered 2017-08-26: 13:00:00 via INTRAVENOUS

## 2017-08-26 MED ORDER — LIDOCAINE 2% (20 MG/ML) 5 ML SYRINGE
INTRAMUSCULAR | Status: AC
  Filled 2017-08-26: qty 5

## 2017-08-26 MED ORDER — LACTATED RINGERS IV SOLN
INTRAVENOUS | Status: DC | PRN
  Administered 2017-08-26 (×2): via INTRAVENOUS

## 2017-08-26 MED ORDER — FENTANYL CITRATE (PF) 250 MCG/5ML IJ SOLN
INTRAMUSCULAR | Status: AC
  Filled 2017-08-26: qty 5

## 2017-08-26 MED ORDER — MAGNESIUM SULFATE 2 GM/50ML IV SOLN: 2.0000 g | Freq: Every day | INTRAVENOUS | Status: DC | PRN

## 2017-08-26 MED ORDER — SUCCINYLCHOLINE CHLORIDE 20 MG/ML IJ SOLN
INTRAMUSCULAR | Status: DC | PRN
  Administered 2017-08-26: 120 mg via INTRAVENOUS

## 2017-08-26 MED ORDER — OXYCODONE HCL 5 MG PO TABS
5.0000 mg | ORAL_TABLET | Freq: Once | ORAL | Status: DC | PRN
Start: 2017-08-26 — End: 2017-08-26

## 2017-08-26 SURGICAL SUPPLY — 62 items
BANDAGE ACE 6X5 VEL STRL LF (GAUZE/BANDAGES/DRESSINGS) ×4 IMPLANT
BANDAGE ESMARK 6X9 LF (GAUZE/BANDAGES/DRESSINGS) IMPLANT
BNDG ESMARK 6X9 LF (GAUZE/BANDAGES/DRESSINGS)
BNDG GAUZE ELAST 4 BULKY (GAUZE/BANDAGES/DRESSINGS) ×4 IMPLANT
CANISTER SUCT 3000ML PPV (MISCELLANEOUS) ×2 IMPLANT
CANNULA VESSEL 3MM 2 BLNT TIP (CANNULA) ×6 IMPLANT
CATH EMB 3FR 80CM (CATHETERS) ×6 IMPLANT
CATH EMB 4FR 80CM (CATHETERS) ×2 IMPLANT
CATH EMB 5FR 80CM (CATHETERS) ×2 IMPLANT
CLIP VESOCCLUDE MED 24/CT (CLIP) ×2 IMPLANT
CLIP VESOCCLUDE SM WIDE 24/CT (CLIP) ×2 IMPLANT
CONT SPEC 4OZ CLIKSEAL STRL BL (MISCELLANEOUS) ×4 IMPLANT
CUFF TOURNIQUET SINGLE 24IN (TOURNIQUET CUFF) IMPLANT
CUFF TOURNIQUET SINGLE 34IN LL (TOURNIQUET CUFF) IMPLANT
CUFF TOURNIQUET SINGLE 44IN (TOURNIQUET CUFF) IMPLANT
DERMABOND ADHESIVE PROPEN (GAUZE/BANDAGES/DRESSINGS) ×2
DERMABOND ADVANCED (GAUZE/BANDAGES/DRESSINGS)
DERMABOND ADVANCED .7 DNX12 (GAUZE/BANDAGES/DRESSINGS) IMPLANT
DERMABOND ADVANCED .7 DNX6 (GAUZE/BANDAGES/DRESSINGS) ×2 IMPLANT
DRAIN SNY WOU (WOUND CARE) IMPLANT
DRAPE HALF SHEET 40X57 (DRAPES) IMPLANT
DRAPE X-RAY CASS 24X20 (DRAPES) IMPLANT
DRSG PAD ABDOMINAL 8X10 ST (GAUZE/BANDAGES/DRESSINGS) ×4 IMPLANT
ELECT REM PT RETURN 9FT ADLT (ELECTROSURGICAL) ×2
ELECTRODE REM PT RTRN 9FT ADLT (ELECTROSURGICAL) ×1 IMPLANT
EVACUATOR SILICONE 100CC (DRAIN) IMPLANT
GAUZE SPONGE 4X4 12PLY STRL (GAUZE/BANDAGES/DRESSINGS) ×4 IMPLANT
GLOVE BIO SURGEON STRL SZ7 (GLOVE) ×2 IMPLANT
GLOVE BIO SURGEON STRL SZ7.5 (GLOVE) ×4 IMPLANT
GLOVE BIOGEL PI IND STRL 7.0 (GLOVE) ×5 IMPLANT
GLOVE BIOGEL PI IND STRL 7.5 (GLOVE) ×1 IMPLANT
GLOVE BIOGEL PI INDICATOR 7.0 (GLOVE) ×5
GLOVE BIOGEL PI INDICATOR 7.5 (GLOVE) ×1
GOWN STRL REUS W/ TWL LRG LVL3 (GOWN DISPOSABLE) ×3 IMPLANT
GOWN STRL REUS W/TWL LRG LVL3 (GOWN DISPOSABLE) ×3
HEMOSTAT SPONGE AVITENE ULTRA (HEMOSTASIS) IMPLANT
KIT BASIN OR (CUSTOM PROCEDURE TRAY) ×2 IMPLANT
KIT ROOM TURNOVER OR (KITS) ×2 IMPLANT
NS IRRIG 1000ML POUR BTL (IV SOLUTION) ×4 IMPLANT
PACK PERIPHERAL VASCULAR (CUSTOM PROCEDURE TRAY) ×2 IMPLANT
PAD ARMBOARD 7.5X6 YLW CONV (MISCELLANEOUS) ×4 IMPLANT
SET COLLECT BLD 21X3/4 12 (NEEDLE) IMPLANT
STOPCOCK 4 WAY LG BORE MALE ST (IV SETS) IMPLANT
SUT PROLENE 5 0 C 1 24 (SUTURE) ×8 IMPLANT
SUT PROLENE 6 0 BV (SUTURE) ×2 IMPLANT
SUT PROLENE 6 0 CC (SUTURE) ×2 IMPLANT
SUT PROLENE 7 0 BV 1 (SUTURE) IMPLANT
SUT PROLENE 7 0 BV1 MDA (SUTURE) IMPLANT
SUT SILK 2 0 SH (SUTURE) ×2 IMPLANT
SUT SILK 3 0 (SUTURE)
SUT SILK 3-0 18XBRD TIE 12 (SUTURE) IMPLANT
SUT VIC AB 2-0 SH 27 (SUTURE) ×2
SUT VIC AB 2-0 SH 27XBRD (SUTURE) ×2 IMPLANT
SUT VIC AB 3-0 SH 27 (SUTURE) ×4
SUT VIC AB 3-0 SH 27X BRD (SUTURE) ×4 IMPLANT
SUT VIC AB 4-0 PS2 27 (SUTURE) ×4 IMPLANT
SYR 3ML LL SCALE MARK (SYRINGE) ×4 IMPLANT
TAPE UMBILICAL COTTON 1/8X30 (MISCELLANEOUS) ×2 IMPLANT
TRAY FOLEY W/METER SILVER 16FR (SET/KITS/TRAYS/PACK) ×2 IMPLANT
TUBING EXTENTION W/L.L. (IV SETS) IMPLANT
UNDERPAD 30X30 (UNDERPADS AND DIAPERS) ×2 IMPLANT
WATER STERILE IRR 1000ML POUR (IV SOLUTION) ×2 IMPLANT

## 2017-08-26 NOTE — Consult Note (Addendum)
Referring Physician: Dr Catha Gosselin  Patient name: [name] [name] MRN: [id number] DOB: January 11, 1951 Sex: male  REASON FOR CONSULT: bilateral leg ischemia  HPI: [name] [name] is a 66 y.o. male who is s/p CVA left brain 5-8 days ago.  Initial event not witness.  Pt has been in hospital since 9/4.  He was noted yesterday around 12pm to have no doppler flow to feet.  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  Pt was significantly dehydrated with a component of rhabdo on admission.  He is aphasic right right leg paresis right arm plegic but is able to answer yes no and denies any pain in feet or legs.  His sister and brother were present for the exam and state he has no alcohol or tobacco use history ever. He has been bradycardic over the last few days.  TTE showed normal EF and no cardiac thrombus.  TEE is scheduled for 9/10.  Recent carotid duplex showed no significant stenosis.  History reviewed. No pertinent past medical history. History reviewed. No pertinent surgical history.  History reviewed. No pertinent family history.  SOCIAL HISTORY: Social History   Social History  . Marital status: Single    Spouse name: N/A  . Number of children: N/A  . Years of education: N/A   Occupational History  . Not on file.   Social History Main Topics  . Smoking status: Never Smoker  . Smokeless tobacco: Never Used  . Alcohol use Not on file  . Drug use: Unknown  . Sexual activity: Not on file   Other Topics Concern  . Not on file   Social History Narrative  . No narrative on file    No Known Allergies  Current Facility-Administered Medications  Medication Dose Route Frequency Provider Last Rate Last Dose  . 0.9 %  sodium chloride infusion   Intravenous Once Sherren Kerns, MD      . aspirin tablet 325 mg  325 mg Oral Daily Edsel Petrin, DO      . atorvastatin (LIPITOR) tablet 20 mg  20 mg Oral q1800 Edsel Petrin, DO   20 mg at  08/25/17 1751  . chlorhexidine (PERIDEX) 0.12 % solution 15 mL  15 mL Mouth Rinse BID Eduard Clos, MD   15 mL at 08/25/17 2126  . dextrose 5 % solution   Intravenous Continuous Edsel Petrin, DO 75 mL/hr at 08/26/17 0255    . heparin ADULT infusion 100 units/mL (25000 units/228mL sodium chloride 0.45%)  1,150 Units/hr Intravenous Continuous Ann Held, [facility]      . MEDLINE mouth rinse  15 mL Mouth Rinse q12n4p Eduard Clos, MD   15 mL at 08/25/17 1655  . RESOURCE THICKENUP CLEAR   Oral PRN Edsel Petrin, DO      . senna-docusate (Senokot-S) tablet 1 tablet  1 tablet Oral QHS PRN Delano Metz, MD   1 tablet at 08/25/17 2131    ROS:   Unable to obtain.  Pt aphasic.  Physical Examination  Vitals:   08/26/17 0306 08/26/17 0700 08/26/17 1000 08/26/17 1205  BP: 124/69 111/64 (!) 124/58 100/70  Pulse: 85 (!) 35 63 (!) 31  Resp: Temp: 98.2 F (36.8 C) 98.2 F (36.8 C)  97.7 F (36.5 C)  TempSrc: Axillary Oral  Oral  SpO2: 99% 97% 95% (!) 80%  Weight:      Height:        Body mass  index is 23.53 kg/m.  General:  Alert and oriented, no acute distress, aphasic but follows commands and answers yes no HEENT: Normal Neck: No JVD Pulmonary: Clear to auscultation bilaterally Cardiac: Regular Rate and Rhythm bradycardic in 40s Abdomen: Soft, non-tender, non-distended, no mass, no scars Skin: No rash Extremity Pulses:  2+ radial, brachial,absent femoral, dorsalis pedis, posterior tibial pulses bilaterally, has a faint right PT doppler signal, dose have some sluggish cap refill in toes bilaterally Musculoskeletal: No deformity or edema  Neurologic: Right upper extremity flaccid, Right lower extremity able to dorsi and plantar flex ankle and toes and flex extend right knee, left leg 5/5 motor.  Left upper extremity 5/5 motor   DATA:  CBC    Component Value Date/Time   WBC 10.9 (H) 08/24/2017 0102   RBC 4.43 08/24/2017 0102   HGB 13.5  08/24/2017 0102   HCT 41.4 08/24/2017 0102   PLT 134 (L) 08/24/2017 0102   MCV 93.5 08/24/2017 0102   MCH 30.5 08/24/2017 0102   MCHC 32.6 08/24/2017 0102   RDW 13.4 08/24/2017 0102   LYMPHSABS 1.2 08/22/2017 1612   MONOABS 1.0 08/22/2017 1612   EOSABS 0.0 08/22/2017 1612   BASOSABS 0.0 08/22/2017 1612    BMET    Component Value Date/Time   NA 144 08/25/2017 0709   K 4.6 08/25/2017 0709   CL 115 (H) 08/25/2017 0709   CO2 22 08/25/2017 0709   GLUCOSE 86 08/25/2017 0709   BUN 22 (H) 08/25/2017 0709   CREATININE 1.07 08/25/2017 0709   CALCIUM 8.4 (L) 08/25/2017 0709   GFRNONAA >60 08/25/2017 0709   GFRAA >60 08/25/2017 16100709    ASSESSMENT:  Exam suggestive of aortic occlusion but pt has minimal to no symptoms.  He has no pain in feet and has completed motor function in ankles and feet bilaterally.  Difficult to know if this is acute which usually would be more symptomatic vs subacute or chronic.  Severe dehydration could possibly be risk for occlusion or cardiac embolic but otherwise no real atherosclerotic risk factors   PLAN:  CTA with runoff to further define anatomy and look for source.  Most likely will need bilateral lower extremity embolectomies which will definitely be high risk in light of recent stroke.  Heparin drip as you are doing.  Pt and family updated at bedside.  Will give saline bolus of fluid now in light of recent renal dysfunction; however, pt had normal creatinine and GFR yesterday.  Keep NPO as most likely operation later today after CT   Fabienne Brunsharles Fields, MD [facility] [place] [facility] Office: [phone] Pager: [phone]

## 2017-08-26 NOTE — Transfer of Care (Signed)
Immediate Anesthesia Transfer of Care Note  Patient: [name] [name]  Procedure(s) Performed: Procedure(s): Bilateral FEMORAL EMBOLECTOMY with  BILATERAL Four Compartment  FASCIOTOMIES. (Bilateral)  Patient Location: PACU  Anesthesia Type:General  Level of Consciousness: alert , sedated and drowsy  Airway & Oxygen Therapy: Patient connected to face mask oxygen  Post-op Assessment: Report given to RN and Post -op Vital signs reviewed and stable  Post vital signs: Reviewed and stable  Last Vitals:  Vitals:   08/26/17 1205 08/26/17 1530  BP: 100/70 134/74  Pulse: (!) 31 (!) 38  Resp: 19 15  Temp: 36.5 C 36.6 C  SpO2: 96% 98%    Last Pain:  Vitals:   08/26/17 1530  TempSrc: Oral  PainSc:          Complications: No apparent anesthesia complications

## 2017-08-26 NOTE — Progress Notes (Signed)
ANTICOAGULATION CONSULT NOTE - Initial Consult  Pharmacy Consult for Heparin Indication: Occludeded arteries LE B/L  No Known Allergies  Patient Measurements: Height: 6\' 1"  (185.4 cm) Weight: 178 lb 5.6 oz (80.9 kg) IBW/kg (Calculated) : 79.9 Heparin Dosing Weight: 80.9 kg  Vital Signs: Temp: 97.7 F (36.5 C) (09/08 1205) Temp Source: Oral (09/08 1205) BP: 100/70 (09/08 1205) Pulse Rate: 31 (09/08 1205)  Labs:  Recent Labs  08/24/17 0102 08/24/17 0647 08/25/17 0709  HGB 13.5  --   --   HCT 41.4  --   --   PLT 134*  --   --   CREATININE 1.17 1.20 1.07    Estimated Creatinine Clearance: 75.7 mL/min (by C-G formula based on SCr of 1.07 mg/dL).   Medical History: History reviewed. No pertinent past medical history.  Assessment: [name] who was found down with AMS on 9/4 and brought into the [facility]. Work-up on admission revealed a new acute L-MCA CVA. Dopplers done 9/8 show occluded B/L LE arteries and pharmacy has been consulted to start heparin for anticoagulation and to hold boluses in the setting of a recent CVA.   The patient received a low-dose of Lovenox 40 mg for VTE prophylaxis on 9/7 evening at 2111. This may effect the initial heparin levels slightly. Hgb/Hct wnl on 9/6, plts 134. Will start conservatively given recent CVA.   Goal of Therapy:  Heparin level 0.3-0.5 units/ml Monitor platelets by anticoagulation protocol: Yes   Plan:  1. D/c Lovenox 2. Start Heparin at 1150 units/hr (11.5 ml/hr) 3. Daily HL, CBC 4. Will continue to monitor for any signs/symptoms of bleeding and will follow up with heparin level in 6 hours   Thank you for allowing pharmacy to be a part of this patient's care.  Georgina PillionElizabeth Corliss Lamartina, PharmD, BCPS Clinical Pharmacist Pager: [phone] Clinical phone for 08/26/2017 from 7a-3:30p: [phone] If after 3:30p, please call main pharmacy at: [phone] 08/26/2017 12:24 PM

## 2017-08-26 NOTE — Progress Notes (Signed)
Patient arrives to Short Stay. When asked if it was 2016 patient shook head up and down. When patient ask if it was 2018 he shook head up and down. When patient ask if he was at home patient shook head up and down. Spoke to brother who had spoken to Dr. Darrick PennaFields as patient does not have any children. Brother give verbal consent with second signature for procedure.

## 2017-08-26 NOTE — Consult Note (Addendum)
CT final images still pending.  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 SMA appears to be patent. Will continue heparin for this for now but will have low threshold to address this if pt develops abdominal pain  Fabienne Brunsharles Fields, MD [facility] [place] Office: [phone] Pager: [phone]

## 2017-08-26 NOTE — Progress Notes (Signed)
Subjective:  Densely aphasic but is able to understand questions and states he is not short of breath or having any chest pain.  Objective:  Vital Signs in the last 24 hours: BP 100/70 (BP Location: Left Arm)   Pulse (!) 31   Temp 97.7 F (36.5 C) (Oral)   Resp 19   Ht 6\' 1"  (1.854 m)   Wt 80.9 kg (178 lb 5.6 oz)   SpO2 (!) 80%   BMI 23.53 kg/m   Physical Exam: Pleasant male in no acute distress Lungs:  Clear  Cardiac:  Regular rhythm, normal S1 and S2, no S3 Abdomen:  Soft, nontender, no masses Extremities:  No edema present Neuro: Aphasic, dense right hemiparesis  Intake/Output from previous day: 09/07 0701 - 09/08 0700 In: 2511.3 [P.O.:1260; I.V.:1251.3] Out: 550 [Urine:550] Weight Filed Weights   08/23/17 0210 08/23/17 0500 08/23/17 2200  Weight: 78.5 kg (173 lb 1 oz) 78.5 kg (173 lb 1 oz) 80.9 kg (178 lb 5.6 oz)    Lab Results: Basic Metabolic Panel:  Recent Labs  96/03/5408/06/18 0647 08/25/17 0709  NA 147* 144  K 3.9 4.6  CL 114* 115*  CO2 24 22  GLUCOSE 84 86  BUN 25* 22*  CREATININE 1.20 1.07    CBC:  Recent Labs  08/24/17 0102  WBC 10.9*  HGB 13.5  HCT 41.4  MCV 93.5  PLT 134*   PROTIME: Lab Results  Component Value Date   INR 1.09 08/22/2017    Telemetry: Sinus bradycardia with PVCs and occasional couplets, rates sometimes get into the 30s  Assessment/Plan:  1.  Asymptomatic sinus bradycardia 2.  Peripheral vascular disease with lower extremity active flow 3.  Recent large left brain stroke  Recommendations:  Patient is not symptomatic from his bradycardia-no indication for pacemaking at this time.  I'm not sure that he needs to have a bedside pacemaker at this point.     Darden PalmerW. Spencer Robbyn Hodkinson, Jr.  MD [facility]  08/26/2017, 12:28 PM

## 2017-08-26 NOTE — Plan of Care (Signed)
Problem: Physical Regulation: Goal: Ability to maintain clinical measurements within normal limits will improve Outcome: Progressing Discussed with patient about post surgical and plan of care for the evening with some teach back displayed

## 2017-08-26 NOTE — Progress Notes (Signed)
PROGRESS NOTE    [name] [name]  WUJ:[id number] DOB: 03-19-1951 DOA: 08/22/2017 PCP: Patient, No Pcp Per   Chief Complaint  Patient presents with  . Altered Mental Status    Brief Narrative:  HPI on 08/22/2017 by [name] OTHO [name] [name] is a 66 y.o. male with unknown prior medical history, per ED notes a family member found him on the floor in urine, they went by his home because he hadn't heard from him in two days. They asked the mother who was at the home how long this had been going and she also stated two days. Upon presentation the patient was completely flaccid R side, oriented name only, unable to speak and wouldn't follow commands. 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  We are asked to see for admission.   Patient unable to speak, but does weakly respond to simple questions.   Per pt's step- brother, [name], the patient has never been sick.  Walks every day, doesn't smoke or drink.  He grew up in [place], [place].  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  Never went to the doctor, never complained about anything.  He retired 5-10 yrs ago.  He doesn't take any medication that the step-brother knows of.   Their mother had a stroke last year and the patient has been looking after her. It can be stressful for the stepbrother.     Interim history Found to have acute CVA and transferred to [facility] from [facility]. Neurology consulted and following. Patient also found to have sinus bradycardia, cardiology consulted and appreciated. Patient plan for TEE with possible loop recorder on 08/28/2017. Patient noted to have absent pulses in the lower ext B/L, doppler showed total occlusions of arteries. Placed on IV heparin, vascular surgery consulted.  Assessment & Plan   Acute left MCA CVA -Presented with right-sided hemoperfusion as well as expressive aphasia -CT head: Acute left MCA territory infarct with left basal gain suspected  petechial hemorrhage, 3 mm rightward midline shift -MRI brain: Large acute left MCA territory infarct most confluent along the insula, operular, basal ganglia, with petechial hemorrhage within the left striatum, cytotoxic edema causes 5mm of midlinie shift.  -MRA head: Left M1 occlusion, advanced right P2 stenosis -Carotid Doppler: Bilateral ICA 1-39% stenosis, vertebral artery flow antegrade -Echocardiogram shows an EF of 55%, no source of emboli -LDL 78, hemoglobin A1c 5.2 -Speech therapy working with patient, currently on dysphagia 1 diet -Continue aspirin and statin -Cardiology consulted for TEE and possible loop recorder, will occur on 08/28/2017 -PT/OT consulted and recommended [name] -Will consult inpatient rehab  Sinus bradycardia -HR in the 30s and patient asymptomatic -HR does increase with exertion -Cardiology consultation appreciated feels this could be due to increased vagal tone following CVA -Does not feel patient needs pacing at this time  Dysuria -UA unremarkable for infection x 2, UA did show large hemoglobin -suspect secondary to  -Continue to monitor    Dehydration/hypernatremia -Placed on IV fluids, sodium peaked to 150 -IVF were changed to D5W, sodium 144 -will discontinue IVF D5W -Continue to monitor BMP  Acute kidney injury -Possibly secondary to dehydration and acute mild rhabdomyolysis -Presented with creatinine of 1.43, placed on IV fluids -Creatinine improved to 1.07 -Continue to monitor BMP  Mild rhabdomyolysis -Likely secondary to the above -will obtain CK level for the morning   Leukocytosis -Resolved, Suspect reactive to CVA -Patient currently afebrile -Blood cultures show no growth  to date -UA and chest x-ray unremarkable for infection  Hyperlipidemia -started on statin  Peripheral vascular disease -LE cool to touch, unable to palpate pulses  -obtained LE doppler: unremarkable for DVT. Incidental findings of age intermediate obstructive  thrombosis involving the common femoral, superficial femoral, popliteal, posterior tibial, peroneal, anterior tibial arteries bilaterally -ABI was cancelled -Discussed with neurology the use of heparin, Dr. Roda ShuttersXu recommended using low dose heparin without a bolus.  -Vascular surgery consultation appreciated. Discussed with Dr. Darrick PennaFields who will see the patient later today.  DVT Prophylaxis  lovenox --> heparin  Code Status: Full  Family Communication: None at bedside, discussed with family via phone  Disposition Plan: Admitted. Continue to monitor in stepdown given patient's bradycardia.   Consultants Neurology Cardiology Vascular surgery Inpatient rehab  Procedures  Echocardiogram Lower extremity doppler  Antibiotics   Anti-infectives    Start     Dose/Rate Route Frequency Ordered Stop   08/22/17 1715  vancomycin (VANCOCIN) IVPB 1000 mg/200 mL premix     1,000 mg 200 mL/hr over 60 Minutes Intravenous  Once 08/22/17 1710 08/22/17 1935   08/22/17 1715  piperacillin-tazobactam (ZOSYN) IVPB 3.375 g     3.375 g 100 mL/hr over 30 Minutes Intravenous  Once 08/22/17 1710 08/22/17 1838      Subjective:   [name] seen and examined today.  Patient continues to be aphasic. Able to understand and respond to yes or no questions by nodding and shaking his head. Denies current chest pain, shortness of breath, abdominal pain, nausea or vomiting. No longer complaining of burning with urination.  Objective:   Vitals:   08/26/17 0306 08/26/17 0700 08/26/17 1000 08/26/17 1205  BP: 124/69 111/64 (!) 124/58 100/70  Pulse: 85 (!) 35 63 (!) 31  Resp: 19 14  19   Temp: 98.2 F (36.8 C) 98.2 F (36.8 C)  97.7 F (36.5 C)  TempSrc: Axillary Oral  Oral  SpO2: 99% 97% 95% (!) 80%  Weight:      Height:        Intake/Output Summary (Last 24 hours) at 08/26/17 1303 Last data filed at 08/26/17 1020  Gross per 24 hour  Intake             3270 ml  Output             1100 ml  Net              2170 ml   Filed Weights   08/23/17 0210 08/23/17 0500 08/23/17 2200  Weight: 78.5 kg (173 lb 1 oz) 78.5 kg (173 lb 1 oz) 80.9 kg (178 lb 5.6 oz)   Exam  General: Well developed, well nourished, NAD, appears stated age  HEENT: NCAT, mucous membranes moist.   Cardiovascular: S1 S2 auscultated, bradycardic, no murmurs  Respiratory: Clear to auscultation bilaterally with equal chest rise  Abdomen: Soft, nontender, nondistended, + bowel sounds  Extremities: warm dry without cyanosis clubbing or edema, cool to touch  Neuro: Awake and alert, aphasic, right sided weakness  Psych: Appropriate  Data Reviewed: I have personally reviewed following labs and imaging studies  CBC:  Recent Labs Lab 08/22/17 1612 08/22/17 1624 08/23/17 0425 08/24/17 0102  WBC 15.4*  --  14.6* 10.9*  NEUTROABS 13.1*  --   --   --   HGB 17.2* 17.7* 15.2 13.5  HCT 50.6 52.0 46.4 41.4  MCV 92.8  --  96.3 93.5  PLT 185  --  133* 134*   Basic Metabolic Panel:  Recent Labs Lab 08/23/17 0425 08/23/17 1007 08/24/17 0102 08/24/17 0647 08/25/17 0709  NA 150* 147* 147* 147* 144  K 4.1 4.2 3.7 3.9 4.6  CL 119* 117* 116* 114* 115*  CO2 GLUCOSE 106* 99 84 84 86  BUN 37* 34* 25* 25* 22*  CREATININE 1.43* 1.24 1.17 1.20 1.07  CALCIUM 8.1* 8.5* 8.4* 8.5* 8.4*   GFR: Estimated Creatinine Clearance: 75.7 mL/min (by C-G formula based on SCr of 1.07 mg/dL). Liver Function Tests:  Recent Labs Lab 08/22/17 1612 08/23/17 1007  AST 47* 33  ALT 30 26  ALKPHOS 56 44  BILITOT 1.5* 1.3*  PROT 8.2* 6.4*  ALBUMIN 4.1 3.1*   No results for input(s): LIPASE, AMYLASE in the last 168 hours.  Recent Labs Lab 08/22/17 1643  AMMONIA 23   Coagulation Profile:  Recent Labs Lab 08/22/17 1612  INR 1.09   Cardiac Enzymes:  Recent Labs Lab 08/22/17 1650 08/22/17 2155 08/23/17 0425 08/23/17 1007  CKTOTAL 709*  --   --  380  TROPONINI  --  <0.03 <0.03 <0.03   BNP (last 3  results) No results for input(s): PROBNP in the last 8760 hours. HbA1C: No results for input(s): HGBA1C in the last 72 hours. CBG:  Recent Labs Lab 08/23/17 2249 08/24/17 0353  GLUCAP 86 79   Lipid Profile: No results for input(s): CHOL, HDL, LDLCALC, TRIG, CHOLHDL, LDLDIRECT in the last 72 hours. Thyroid Function Tests: No results for input(s): TSH, T4TOTAL, FREET4, T3FREE, THYROIDAB in the last 72 hours. Anemia Panel: No results for input(s): VITAMINB12, FOLATE, FERRITIN, TIBC, IRON, RETICCTPCT in the last 72 hours. Urine analysis:    Component Value Date/Time   COLORURINE YELLOW 08/25/2017 1026   APPEARANCEUR HAZY (A) 08/25/2017 1026   LABSPEC 1.016 08/25/2017 1026   PHURINE 5.0 08/25/2017 1026   GLUCOSEU NEGATIVE 08/25/2017 1026   HGBUR LARGE (A) 08/25/2017 1026   BILIRUBINUR NEGATIVE 08/25/2017 1026   KETONESUR NEGATIVE 08/25/2017 1026   PROTEINUR 30 (A) 08/25/2017 1026   NITRITE NEGATIVE 08/25/2017 1026   LEUKOCYTESUR MODERATE (A) 08/25/2017 1026   Sepsis Labs: (procalcitonin:4,lacticidven:4)  ) Recent Results (from the past 240 hour(s))  Blood Culture (routine x 2)     Status: None (Preliminary result)   Collection Time: 08/22/17  4:40 PM  Result Value Ref Range Status   Specimen Description RIGHT ANTECUBITAL  Final   Special Requests   Final    BOTTLES DRAWN AEROBIC AND ANAEROBIC Blood Culture adequate volume   Culture NO GROWTH 4 DAYS  Final   Report Status PENDING  Incomplete  Blood Culture (routine x 2)     Status: None (Preliminary result)   Collection Time: 08/22/17  4:44 PM  Result Value Ref Range Status   Specimen Description BLOOD LEFT HAND  Final   Special Requests   Final    BOTTLES DRAWN AEROBIC AND ANAEROBIC Blood Culture adequate volume   Culture NO GROWTH 4 DAYS  Final   Report Status PENDING  Incomplete  MRSA PCR Screening     Status: None   Collection Time: 08/23/17  1:59 AM  Result Value Ref Range Status   MRSA by PCR  NEGATIVE NEGATIVE Final    Comment:        The GeneXpert MRSA Assay (FDA approved for NASAL specimens only), is one component of a comprehensive MRSA colonization surveillance program. It is not intended to diagnose MRSA infection nor to guide or monitor treatment for MRSA infections.  Urine Culture     Status: None   Collection Time: 08/25/17 10:26 AM  Result Value Ref Range Status   Specimen Description URINE, CLEAN CATCH  Final   Special Requests NONE  Final   Culture NO GROWTH  Final   Report Status 08/26/2017 FINAL  Final      Radiology Studies: Dg Swallowing Func-speech Pathology  Result Date: 08/24/2017 Objective Swallowing Evaluation: Type of Study: MBS-Modified Barium Swallow Study Patient Details Name: FRANCISCA HARBUCK MRN: [id number] Date of Birth: [birth year] Today's Date: 08/24/2017 Time: SLP Start Time (ACUTE ONLY): 1258-SLP Stop Time (ACUTE ONLY): 1316 SLP Time Calculation (min) (ACUTE ONLY): 18 min Past Medical History: No past medical history on file. Past Surgical History: No past surgical history on file. HPI: Ptis a 66 [facility] large subacute MCA infarct and 5 mm midline shift. He was transferred to [facility] out of concern for swelling. No significant PMH is known. Subjective: pt alert but nonverbal Assessment / Plan / Recommendation CHL IP CLINICAL IMPRESSIONS 08/24/2017 Clinical Impression Pt has a moderate oral and mild pharyngeal dysphagia due to right-sided weakness and sensory deficits. 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 He also has disorganized bolus formation and slow transit, with R sided pocketing that is most prevalent with soft solids as almost the entire bolus remains in his buccal cavity until it is manually removed by SLP. His pharyngeal phase is grossly functional except for mild base of tongue weakness that allows for some vallecular  residue; however, his poor oral control also results in intermittent premature spillage that causes silent penetration to the true vocal folds with thin and nectar thick liquids. When he consumed nectar thick liquids by straw, his oral containment was improved, even with challenging. Recommend initiation of Dys 1 diet and nectar thick liquids by straw.  SLP Visit Diagnosis Dysphagia, oropharyngeal phase (R13.12) Attention and concentration deficit following -- Frontal lobe and executive function deficit following -- Impact on safety and function Mild aspiration risk;Moderate aspiration risk   CHL IP TREATMENT RECOMMENDATION 08/24/2017 Treatment Recommendations Therapy as outlined in treatment plan below   Prognosis 08/24/2017 Prognosis for Safe Diet Advancement Good Barriers to Reach Goals Language deficits Barriers/Prognosis Comment -- CHL IP DIET RECOMMENDATION 08/24/2017 SLP Diet Recommendations Dysphagia 1 (Puree) solids;Nectar thick liquid Liquid Administration via Straw only (NO cup sips) Medication Administration Crushed with puree Compensations Slow rate;Small sips/bites;Lingual sweep for clearance of pocketing;Monitor for anterior loss Postural Changes Seated upright at 90 degrees   CHL IP OTHER RECOMMENDATIONS 08/24/2017 Recommended Consults -- Oral Care Recommendations Oral care BID Other Recommendations Have oral suction available;Order thickener from pharmacy;Prohibited food (jello, ice cream, thin soups);Remove water pitcher   CHL IP FOLLOW UP RECOMMENDATIONS 08/24/2017 Follow up Recommendations Inpatient Rehab   CHL IP FREQUENCY AND DURATION 08/24/2017 Speech Therapy Frequency (ACUTE ONLY) min 2x/week Treatment Duration 2 weeks      CHL IP ORAL PHASE 08/24/2017 Oral Phase Impaired Oral - Pudding Teaspoon -- Oral - Pudding Cup -- Oral - Honey Teaspoon -- Oral - Honey Cup -- Oral - Nectar Teaspoon -- Oral - Nectar Cup Right anterior bolus loss;Weak lingual manipulation;Reduced posterior propulsion;Delayed oral  transit;Decreased bolus cohesion;Premature spillage Oral - Nectar Straw Weak lingual manipulation;Reduced posterior propulsion;Delayed oral transit;Decreased bolus cohesion;Premature spillage Oral - Thin Teaspoon -- Oral - Thin Cup Right anterior bolus loss;Weak lingual manipulation;Reduced posterior propulsion;Delayed oral transit;Decreased bolus cohesion;Premature  spillage Oral - Thin Straw -- Oral - Puree Right anterior bolus loss;Weak lingual manipulation;Reduced posterior propulsion;Delayed oral transit;Decreased bolus cohesion;Right pocketing in lateral sulci Oral - Mech Soft Right anterior bolus loss;Weak lingual manipulation;Reduced posterior propulsion;Delayed oral transit;Decreased bolus cohesion;Premature spillage;Right pocketing in lateral sulci Oral - Regular -- Oral - Multi-Consistency -- Oral - Pill -- Oral Phase - Comment --  CHL IP PHARYNGEAL PHASE 08/24/2017 Pharyngeal Phase Impaired Pharyngeal- Pudding Teaspoon -- Pharyngeal -- Pharyngeal- Pudding Cup -- Pharyngeal -- Pharyngeal- Honey Teaspoon -- Pharyngeal -- Pharyngeal- Honey Cup -- Pharyngeal -- Pharyngeal- Nectar Teaspoon -- Pharyngeal -- Pharyngeal- Nectar Cup Reduced tongue base retraction;Pharyngeal residue - valleculae;Penetration/Aspiration before swallow Pharyngeal Material enters airway, CONTACTS cords and not ejected out Pharyngeal- Nectar Straw Reduced tongue base retraction;Pharyngeal residue - valleculae Pharyngeal -- Pharyngeal- Thin Teaspoon -- Pharyngeal -- Pharyngeal- Thin Cup Reduced tongue base retraction;Pharyngeal residue - valleculae;Penetration/Aspiration before swallow Pharyngeal Material enters airway, CONTACTS cords and not ejected out Pharyngeal- Thin Straw -- Pharyngeal -- Pharyngeal- Puree Reduced tongue base retraction;Pharyngeal residue - valleculae Pharyngeal -- Pharyngeal- Mechanical Soft Reduced tongue base retraction;Pharyngeal residue - valleculae Pharyngeal -- Pharyngeal- Regular -- Pharyngeal -- Pharyngeal-  Multi-consistency -- Pharyngeal -- Pharyngeal- Pill -- Pharyngeal -- Pharyngeal Comment --  CHL IP CERVICAL ESOPHAGEAL PHASE 08/24/2017 Cervical Esophageal Phase WFL Pudding Teaspoon -- Pudding Cup -- Honey Teaspoon -- Honey Cup -- Nectar Teaspoon -- Nectar Cup -- Nectar Straw -- Thin Teaspoon -- Thin Cup -- Thin Straw -- Puree -- Mechanical Soft -- Regular -- Multi-consistency -- Pill -- Cervical Esophageal Comment -- No flowsheet data found. Maxcine Ham 08/24/2017, 2:49 PM  Maxcine Ham, M.A. CCC-SLP [phone]               Scheduled Meds: . aspirin  325 mg Oral Daily  . atorvastatin  20 mg Oral q1800  . chlorhexidine  15 mL Mouth Rinse BID  . mouth rinse  15 mL Mouth Rinse q12n4p   Continuous Infusions: . sodium chloride 1,000 mL/hr at 08/26/17 1245  . dextrose 75 mL/hr at 08/26/17 0255  . heparin 1,150 Units/hr (08/26/17 1246)     LOS: 4 days   Time Spent in minutes   45 minutes  Chrishaun Sasso D.O. on 08/26/2017 at 1:03 PM  Between 7am to 7pm - Pager - [phone]  After 7pm go to [email or web address] - password [email or web address]  And look for the night coverage person covering for me after hours  [facility] Office  [phone]

## 2017-08-26 NOTE — Anesthesia Procedure Notes (Signed)
Procedure Name: Intubation Date/Time: 08/26/2017 4:59 PM Performed by: Myra RudeUTTLE, Lakisa Lotz ANN Pre-anesthesia Checklist: Patient identified, Emergency Drugs available, Suction available and Patient being monitored Patient Re-evaluated:Patient Re-evaluated prior to induction Oxygen Delivery Method: Circle System Utilized Preoxygenation: Pre-oxygenation with 100% oxygen Induction Type: IV induction, Rapid sequence and Cricoid Pressure applied Ventilation: Mask ventilation without difficulty Laryngoscope Size: Glidescope and 4 Grade View: Grade I Tube type: Oral Tube size: 8.0 mm Number of attempts: 1 Airway Equipment and Method: Stylet,  Oral airway and Rigid stylet Placement Confirmation: ETT inserted through vocal cords under direct vision,  positive ETCO2 and breath sounds checked- equal and bilateral Secured at: 23 cm Tube secured with: Tape Dental Injury: Teeth and Oropharynx as per pre-operative assessment

## 2017-08-26 NOTE — Op Note (Addendum)
Procedure: Bilateral embolectomy iliac femoral popliteal, 4 compartment fasciotomy bilaterally  Preoperative diagnosis: Bilateral lower extremity ischemia  Postoperative diagnosis: Same  Anesthesia: Gen.  Assistant: Nurse  Operative findings: Large amount of embolic material iliofemoral popliteal bilaterally #2 right foot posterior tibial dorsalis pedis Doppler signal at conclusion of case, left foot posterior tibial Doppler signal  Operative details: After obtaining informed consent from the patient and his brother, the patient was taken to the operating room. The patient placed in supine position upper and table. After induction general anesthesia and endotracheal ablation, Foley catheter was placed. Next patient was prepped and draped in usual sterile fashion from the umbilicus down to the toes. Longitudinal incision was made in the left groin and carried onto subcutaneous tissues down level left common femoral artery. This had no pulse within it. The common femoral artery was dissected free circumferentially at the level of the inguinal ligament. The profunda femoris and superficial femoral arteries were both dissected free circumferentially and vessel loops placed around these. The patient's heparin infusion was stopped. He was given a bolus of 8000 units of intravenous heparin. A transverse arteriotomy is made just above the femoral bifurcation. #4 and 5 Fogarty catheters are used to thrombectomize the left iliac system. A large amount of embolic material was removed. This was sent to pathology as specimen. There were 4 Fogarty catheter was then passed down the profunda femoris artery. Again a large amount of thrombus was removed. There was good backbleeding. There was excellent inflow from the iliac system. A 4 Fogarty catheter was also passed down the superficial femoral artery. I was able to get this to the 50 cm mark. A large amount of thrombus was also removed from the superficial femoral  artery. 2 clean passes were obtained from each artery. I was unable to pass the Fogarty catheter any further than 50 cm down the superficial femoral artery. At this point all the arteries were thoroughly flushed with heparinized saline. The arteriotomy was then reapproximated using a running 5-0 Prolene suture. Just prior completion of the anastomosis was forebled backbled and thoroughly flushed. Anastomosis was secured clamps released and there was a good posterior tibial Doppler signal in the left foot and a faint anterior tibial Doppler signal. Attention was then turned to the right groin. In similar fashion a longitudinal incision was made in the right groin carried down to the saphenous tissues down to level the right common femoral artery. This also had no pulse within it. This was dissected free circumferentially. The profunda femoris and superficial femoral arteries were also dissected free circumferentially and vessel loops were placed around all of these. Transverse arteriotomy was made just above the femoral bifurcation. #4 and 5 Fogarty catheters were used to thrombectomize the proximal aspect of the artery all the way up to the level of the common iliac artery. A large amount of embolic material was removed and sent to pathology as a specimen. There was excellent arterial inflow at this point. The 4 Fogarty catheter was passed down the profunda femoris several times and also large amount of embolic material was removed. There was good backbleeding from the profunda at this point. I then passed the Fogarty down the superficial femoral artery. I was only able to get to about 30 cm in the right leg. Again a large amount of thrombotic material was removed. There was some backbleeding from the superficial femoral artery. Catheter was passed on each of these arteries until 2 clean passes was obtained. The arteriotomy was  then reapproximated using a running 5-0 Prolene suture. Just prior completion anastomosis  was forebled backbled and thoroughly flushed. Anastomosis was secured clamps released there was pulsatile flow in the common femoral and needle. There was a good posterior tibial and dorsalis pedis Doppler signal in the right foot. Each groin was packed with dry gauze to obtain hemostasis. A couple of repair sutures were placed in the right anastomosis. Attention was then turned to the lower leg. Since the patient had had a prolonged ischemic episode. I performed bilateral 4 compartment fasciotomies this was done through a medial and lateral incision on both legs. The incision was carried down through the subcutaneous tissue down to level of fascia. The anterior and lateral compartments were opened on the lateral incision the medial and deep compartments opened on the medial incision. The muscle was all pink and viable in all 4 compartments. Hemostasis was obtained. A normal saline wet to dry dressing was applied to each of the fasciotomy wounds. The patient tolerated the procedure well there were no complications. The patient's heparin drip will be resumed when he reaches the recovery room. The instrument sponge and needle count was correct at the end of the case.  [name] [name] [name] Shipton, MD [facility] Office: [phone] Pager: [phone]

## 2017-08-26 NOTE — Anesthesia Preprocedure Evaluation (Addendum)
Anesthesia Evaluation  Patient identified by MRN, date of birth, ID band Patient awake    Reviewed: Allergy & Precautions, NPO status , Patient's Chart, lab work & pertinent test results  Airway Mallampati: III  TM Distance: >3 FB Neck ROM: Full  Mouth opening: Limited Mouth Opening Comment: Right sided tongue extrusion  Dental no notable dental hx.    Pulmonary neg pulmonary ROS,    Pulmonary exam normal breath sounds clear to auscultation       Cardiovascular negative cardio ROS Normal cardiovascular exam Rhythm:Regular Rate:Normal  ECG: SR, rate 62  Bradycardia  Left ventricle: The cavity size was normal. Wall thickness was normal. Systolic function was normal. The estimated ejection fraction was 55%. Wall motion was normal; there were no regional wall motion abnormalities. Left ventricular diastolic function parameters were normal. Aortic valve: Trileaflet; mildly thickened leaflets. There was trivial regurgitation. Aorta: Mild aortic root dilatation. Aortic root dimension: 39 mm (ED). Mitral valve: There was mild regurgitation. Left atrium: The atrium was mildly dilated. Atrial septum: No defect or patent foramen ovale was identified. Pulmonic valve: There was mild regurgitation.   Neuro/Psych Right hemiparesis Altered mental status Aphasia CVA, Residual Symptoms negative psych ROS   GI/Hepatic negative GI ROS, Neg liver ROS,   Endo/Other  negative endocrine ROS  Renal/GU negative Renal ROS     Musculoskeletal negative musculoskeletal ROS (+)   Abdominal   Peds  Hematology negative hematology ROS (+)   Anesthesia Other Findings   Reproductive/Obstetrics                            Anesthesia Physical Anesthesia Plan  ASA: IV and emergent  Anesthesia Plan: General   Post-op Pain Management:    Induction: Intravenous  PONV Risk Score and Plan: 2 and Ondansetron and  Dexamethasone  Airway Management Planned: Oral ETT and Video Laryngoscope Planned  Additional Equipment:   Intra-op Plan:   Post-operative Plan: Extubation in OR  Informed Consent: I have reviewed the patients History and Physical, chart, labs and discussed the procedure including the risks, benefits and alternatives for the proposed anesthesia with the patient or authorized representative who has indicated his/her understanding and acceptance.   Dental advisory given  Plan Discussed with: CRNA  Anesthesia Plan Comments: (Possible arterial line discussed)      Anesthesia Quick Evaluation

## 2017-08-26 NOTE — Anesthesia Postprocedure Evaluation (Signed)
Anesthesia Post Note  Patient: [name] [name]  Procedure(s) Performed: Procedure(s) (LRB): Bilateral FEMORAL EMBOLECTOMY with  BILATERAL Four Compartment  FASCIOTOMIES. (Bilateral)     Patient location during evaluation: PACU Anesthesia Type: General Level of consciousness: awake Pain management: pain level controlled Vital Signs Assessment: post-procedure vital signs reviewed and stable Respiratory status: spontaneous breathing, nonlabored ventilation, respiratory function stable and patient connected to nasal cannula oxygen Cardiovascular status: blood pressure returned to baseline and stable Postop Assessment: no signs of nausea or vomiting Anesthetic complications: no    Last Vitals:  Vitals:   08/26/17 2244 08/26/17 2300  BP: 123/71 123/64  Pulse: (!) 43 (!) 43  Resp: 14 18  Temp: (!) 36.4 C   SpO2: 100% 97%    Last Pain:  Vitals:   08/26/17 2244  TempSrc: Oral  PainSc:                  Catheryn Baconyan P Breya Cass

## 2017-08-26 NOTE — Progress Notes (Signed)
MD Mikhail made aware that pts family (sister) is at bedside and would like updates on pts condition. They stated this is the first time they have been able to make it up here to the hospital.

## 2017-08-26 NOTE — Progress Notes (Addendum)
**  Preliminary report by tech**  Bilateral lower extremity venous duplex completed. There is no evidence of deep or superficial vein thrombosis involving the right and left lower extremities. All visualized veins appear patent and compressible. There is no evidence of Baker's cysts bilaterally. Incidental findings consist of age-indeterminate obstructive thrombosis involving the common femoral artery, superficial femoral artery, popliteal artery, posterior tibial artery, peroneal artery, and anterior tibial artery bilaterally. Images of the obstructions were taken using duplex ultrasound.  Results were given to Dr. Catha GosselinMikhail.  08/26/17 11:05 AM Olen CordialGreg Drishti Pepperman RVT

## 2017-08-27 ENCOUNTER — Encounter (HOSPITAL_COMMUNITY): Payer: Self-pay | Admitting: Vascular Surgery

## 2017-08-27 DIAGNOSIS — I749 Embolism and thrombosis of unspecified artery: Secondary | ICD-10-CM

## 2017-08-27 DIAGNOSIS — L8990 Pressure ulcer of unspecified site, unspecified stage: Secondary | ICD-10-CM | POA: Insufficient documentation

## 2017-08-27 LAB — CULTURE, BLOOD (ROUTINE X 2)
Culture: NO GROWTH
Culture: NO GROWTH
Special Requests: ADEQUATE
Special Requests: ADEQUATE

## 2017-08-27 LAB — BASIC METABOLIC PANEL
Anion gap: 8 (ref 5–15)
Anion gap: 7 (ref 5–15)
BUN: 12 mg/dL (ref 6–20)
BUN: 12 mg/dL (ref 6–20)
CO2: 26 mmol/L (ref 22–32)
CO2: 27 mmol/L (ref 22–32)
Calcium: 8.2 mg/dL — ABNORMAL LOW (ref 8.9–10.3)
Calcium: 8.3 mg/dL — ABNORMAL LOW (ref 8.9–10.3)
Chloride: 103 mmol/L (ref 101–111)
Chloride: 105 mmol/L (ref 101–111)
Creatinine, Ser: 1.19 mg/dL (ref 0.61–1.24)
Creatinine, Ser: 1.18 mg/dL (ref 0.61–1.24)
GFR calc Af Amer: >60 mL/min (ref >60)
GFR calc Af Amer: >60 mL/min (ref >60)
GFR calc non Af Amer: >60 mL/min (ref >60)
GFR calc non Af Amer: >60 mL/min (ref >60)
Glucose, Bld: 146 mg/dL — ABNORMAL HIGH (ref 65–99)
Glucose, Bld: 147 mg/dL — ABNORMAL HIGH (ref 65–99)
Potassium: 4.4 mmol/L (ref 3.5–5.1)
Potassium: 4.6 mmol/L (ref 3.5–5.1)
SODIUM: 139 mmol/L (ref 135–145)
Sodium: 137 mmol/L (ref 135–145)

## 2017-08-27 LAB — CBC
HCT: 38.2 % — ABNORMAL LOW (ref 39.0–52.0)
HEMATOCRIT: 38.4 % — AB (ref 39.0–52.0)
HEMOGLOBIN: 13.1 g/dL (ref 13.0–17.0)
Hemoglobin: 12.9 g/dL — ABNORMAL LOW (ref 13.0–17.0)
MCH: 30.5 pg (ref 26.0–34.0)
MCH: 30.4 pg (ref 26.0–34.0)
MCHC: 34.1 g/dL (ref 30.0–36.0)
MCHC: 33.8 g/dL (ref 30.0–36.0)
MCV: 89.5 fL (ref 78.0–100.0)
MCV: 89.9 fL (ref 78.0–100.0)
PLATELETS: 148 10*3/uL — AB (ref 150–400)
Platelets: 154 10*3/uL (ref 150–400)
RBC: 4.25 MIL/uL (ref 4.22–5.81)
RBC: 4.29 MIL/uL (ref 4.22–5.81)
RDW: 12.9 % (ref 11.5–15.5)
RDW: 12.7 % (ref 11.5–15.5)
WBC: 13.3 10*3/uL — ABNORMAL HIGH (ref 4.0–10.5)
WBC: 13.4 10*3/uL — ABNORMAL HIGH (ref 4.0–10.5)

## 2017-08-27 LAB — CK: Total CK: 175 U/L (ref 49–397)

## 2017-08-27 LAB — HEPARIN LEVEL (UNFRACTIONATED)
HEPARIN UNFRACTIONATED: 0.53 [IU]/mL (ref 0.30–0.70)
Heparin Unfractionated: 0.29 IU/mL — ABNORMAL LOW (ref 0.30–0.70)
Heparin Unfractionated: 0.15 IU/mL — ABNORMAL LOW (ref 0.30–0.70)

## 2017-08-27 LAB — GLUCOSE, CAPILLARY: Glucose-Capillary: 126 mg/dL — ABNORMAL HIGH (ref 65–99)

## 2017-08-27 MED ORDER — DEXTROSE 5 % IV SOLN
1.5000 g | INTRAVENOUS | Status: AC
  Administered 2017-08-28: 1.5 g via INTRAVENOUS
  Filled 2017-08-27: qty 1.5

## 2017-08-27 NOTE — Progress Notes (Signed)
Physical Therapy Treatment Patient Details Name: [name] CreekRichard P Algeo MRN: [id number] DOB: 02/19/1951 Today's Date: 08/27/2017    History of Present Illness Patient is a 66 y/o male who was found down at home covered in urine. 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 No PMH as pt has not been to the doctor. Noted bilateral leg ischemia on 9/8; to OR for Bilateral embolectomy iliac femoral popliteal, 4 compartment fasciotomy bilaterally that evening, with plans to go to OR for closure of fasciotomies soon    PT Comments    Continuing work on functional mobility and activity tolerance;  Excellent participation and effort; Bil LEs with good AROM throughout, still with decr strength, requiring max assist for successful sit to stand; Goals set on original PT eval continue to be appropriate   Follow Up Recommendations  [name]     Equipment Recommendations  Other (comment) (TBA)    Recommendations for Other Services Rehab consult     Precautions / Restrictions Precautions Precautions: Fall Restrictions Weight Bearing Restrictions: No    Mobility  Bed Mobility Overal bed mobility: Needs Assistance Bed Mobility: Supine to Sit     Supine to sit: Mod assist     General bed mobility comments: Cues for sequencing and hand placement. Assist to scoot hips with use of bed pad and for balance initially in sitting  Transfers Overall transfer level: Needs assistance Equipment used: 2 person hand held assist Transfers: Sit to/from BJ'sStand;Stand Pivot Transfers Sit to Stand: Max assist;+2 physical assistance Stand pivot transfers: Max assist;+2 physical assistance       General transfer comment: Assist to boost up to full upright standing; cues for posture and technqiue. Max assist +2 for pivot to chair going to R side. Difficulty unweight either foot for stepping  Ambulation/Gait                 Stairs            Wheelchair Mobility    Modified Rankin  (Stroke Patients Only) Modified Rankin (Stroke Patients Only) Pre-Morbid Rankin Score: No symptoms Modified Rankin: Moderately severe disability     Balance Overall balance assessment: Needs assistance Sitting-balance support: Feet supported;No upper extremity supported Sitting balance-Leahy Scale: Good Sitting balance - Comments: Able to sit unsupported EOB x5 minutes with weight shifting     Standing balance-Leahy Scale: Poor                              Cognition Arousal/Alertness: Awake/alert Behavior During Therapy: Flat affect Overall Cognitive Status: Difficult to assess Area of Impairment: Following commands;Problem solving                       Following Commands: Follows one step commands consistently;Follows one step commands with increased time     Problem Solving: Requires verbal cues;Requires tactile cues (Requires visual cues) General Comments: inconsistent responses to questions but able to follow commands well with visual and verbal cues      Exercises      General Comments        Pertinent Vitals/Pain Pain Assessment: No/denies pain Faces Pain Scale: No hurt    Home Living                      Prior Function            PT Goals (current goals can now be found  in the care plan section) Acute Rehab PT Goals Patient Stated Goal: none stated but most likely to return to PLOF PT Goal Formulation: Patient unable to participate in goal setting Time For Goal Achievement: 09/08/17 Potential to Achieve Goals: Fair Progress towards PT goals: Progressing toward goals    Frequency    Min 4X/week      PT Plan Current plan remains appropriate    Co-evaluation PT/OT/SLP Co-Evaluation/Treatment: Yes Reason for Co-Treatment: For patient/therapist safety PT goals addressed during session: Mobility/safety with mobility OT goals addressed during session: ADL's and self-care      AM-PAC PT "6 Clicks" Daily Activity   Outcome Measure  Difficulty turning over in bed (including adjusting bedclothes, sheets and blankets)?: None Difficulty moving from lying on back to sitting on the side of the bed? : Unable Difficulty sitting down on and standing up from a chair with arms (e.g., wheelchair, bedside commode, etc,.)?: Unable Help needed moving to and from a bed to chair (including a wheelchair)?: A Lot Help needed walking in hospital room?: A Lot Help needed climbing 3-5 steps with a railing? : Total 6 Click Score: 11    End of Session Equipment Utilized During Treatment: Gait belt Activity Tolerance: Patient tolerated treatment well Patient left: in chair;with call bell/phone within reach;with chair alarm set Nurse Communication: Mobility status PT Visit Diagnosis: Hemiplegia and hemiparesis;Unsteadiness on feet (R26.81);Difficulty in walking, not elsewhere classified (R26.2) Hemiplegia - Right/Left: Right Hemiplegia - dominant/non-dominant: Dominant Hemiplegia - caused by: Cerebral infarction     Time: 1610-9604 PT Time Calculation (min) (ACUTE ONLY): 23 min  Charges:  $Therapeutic Activity: 8-22 mins                    G Codes:       Van Clines, PT  Acute Rehabilitation Services Pager [phone] Office [phone]    Levi Aland 08/27/2017, 12:20 PM

## 2017-08-27 NOTE — Progress Notes (Signed)
ANTICOAGULATION CONSULT NOTE - Initial Consult  Pharmacy Consult for Heparin Indication: Occludeded arteries LE B/L  No Known Allergies  Patient Measurements: Height: 6\' 1"  (185.4 cm) Weight: 178 lb 5.6 oz (80.9 kg) IBW/kg (Calculated) : 79.9 Heparin Dosing Weight: 80.9 kg  Vital Signs: Temp: 98.1 F (36.7 C) (09/09 0700) Temp Source: Oral (09/09 0700) BP: 111/74 (09/09 0700) Pulse Rate: 45 (09/09 0700)  Labs:  Recent Labs  08/25/17 0709 08/27/17 0411 08/27/17 0958  HGB  --  13.1  12.9*  --   HCT  --  38.4*  38.2*  --   PLT  --  154  148*  --   HEPARINUNFRC  --  0.53 0.29*  CREATININE 1.07 1.18  1.19  --   CKTOTAL  --  175  --     Estimated Creatinine Clearance: 68.1 mL/min (by C-G formula based on SCr of 1.19 mg/dL).   Medical History: History reviewed. No pertinent past medical history.  Assessment: [name] who was found down with AMS on 9/4 and brought into the [facility]. Work-up on admission revealed a new acute L-MCA CVA. Dopplers done 9/8 show occluded B/L LE arteries and pharmacy has been consulted to start heparin for anticoagulation and to hold boluses in the setting of a recent CVA.   The patient was noted to be taken for emergent embolectomies/fasciotomies yesterday evening (9/8) with heparin resumed post-op. Per VVS, the plan is to likely go back to the OR on 9/10 to close the fasciotomies.    A heparin level this morning is slightly SUBtherapeutic after a rate decrease earlier today (HL 0.29 << 0.53, goal of 0.3-0.5). It is noted that a new bag was charted as hung right around the time of the lab drawn - so the drip may have been off to cause the level to be slightly low. Will continue at the current rate since so close to goal and will recheck a level this afternoon.  Goal of Therapy:  Heparin level 0.3-0.5 units/ml Monitor platelets by anticoagulation protocol: Yes   Plan:  1. Continue Heparin at 1050 units/hr (10.5 ml/hr) 2. Will continue to monitor  for any signs/symptoms of bleeding and will follow up with heparin level in 6 hours   Thank you for allowing pharmacy to be a part of this patient's care.  Georgina PillionElizabeth Pieter Fooks, PharmD, BCPS Clinical Pharmacist Pager: [phone] Clinical phone for 08/27/2017 from 7a-3:30p: [phone] If after 3:30p, please call main pharmacy at: [phone] 08/27/2017 12:21 PM

## 2017-08-27 NOTE — Progress Notes (Signed)
  Speech Language Pathology Treatment: Cognitive-Linquistic;Dysphagia  Patient Details Name: [name] [name] MRN: [id number] DOB: 05/18/1951 Today's Date: 08/27/2017 Time: [id number] SLP Time Calculation (min) (ACUTE ONLY): 26 min  Assessment / Plan / Recommendation Clinical Impression  Patient seen for dysphagia and cognitive linguistic treatment. Per RN patient was NPO for surgery and post-surgery diet orders placed for dys 1/thin liquids per MD; RN and pt's mother have continued thickening liquids with thickener in room. Reviewed with pt swallowing precautions and results of MBS with recommendations for nectar-thick liquids via straw. Assisted with thickening juice to nectar consistency; pt consumes via straw with no anterior loss but requires mod cues, assistance for impulsivity and to reduce rate of intake. With set-up pt self-feeds pureed solids, again rapid rate of intake noted, with R anterior loss and pocketing noted; pt required verbal, visual and tactile cues to slow rate, lingual sweep for clearance of pocketing. Would continue dys 1 with nectar thick liquids for now as pt still requiring consistent mod cues for swallowing precautions. For cognitive-linguistic treatment, SLP addressed aphasia by attempting to facilitate pt's automatic speech with verbal, tactile, and demonstration cues. Despite max cues, pt unable to produce words or approximations; he did briefly phonate with cues and demonstration for breath/phonation coordination. SLP provided communication boards to facilitate pt's expression of wants and needs. Initially pt unable to identify icons; however SLP demo'd gesturing to icons during simple tasks (turning on the light, raising, lowering head of the bed). Following demonstration, pt gestured to 2 icons appropriately in response to SLP questions re: preference for light on/off, raised/lowered HOB). Will continue to follow for communication and swallowing needs.     HPI HPI: Ptis  a 66 y.o.male[facility] with a large subacute MCA infarct and 5 mm midline shift. [facility]. No significant PMH is known.      SLP Plan  Continue with current plan of care       Recommendations  Diet recommendations: Dysphagia 1 (puree);Nectar-thick liquid Liquids provided via: Straw Medication Administration: Crushed with puree Supervision: Patient able to self feed;Full supervision/cueing for compensatory strategies Compensations: Slow rate;Small sips/bites;Lingual sweep for clearance of pocketing;Monitor for anterior loss Postural Changes and/or Swallow Maneuvers: Seated upright 90 degrees;Upright 30-60 min after meal                Oral Care Recommendations: Oral care BID Follow up Recommendations: Inpatient Rehab SLP Visit Diagnosis: Dysphagia, oropharyngeal phase (R13.12);Aphasia (R47.01) Plan: Continue with current plan of care       GO               [name] BatonMary Beth Xadrian Craighead, MS, CCC-SLP Speech-Language Pathologist [phone]   [name] [name] 08/27/2017, 4:55 PM

## 2017-08-27 NOTE — Progress Notes (Signed)
ANTICOAGULATION CONSULT NOTE  Pharmacy Consult for Heparin Indication: Occludeded arteries LE B/L  No Known Allergies  Patient Measurements: Height: 6\' 1"  (185.4 cm) Weight: 178 lb 5.6 oz (80.9 kg) IBW/kg (Calculated) : 79.9 Heparin Dosing Weight: 80.9 kg  Vital Signs: Temp: 97.9 F (36.6 C) (09/09 1700) Temp Source: Oral (09/09 1700) BP: 111/74 (09/09 0700) Pulse Rate: 45 (09/09 0700)  Labs:  Recent Labs  08/25/17 0709 08/27/17 0411 08/27/17 0958 08/27/17 1558  HGB  --  13.1  12.9*  --   --   HCT  --  38.4*  38.2*  --   --   PLT  --  154  148*  --   --   HEPARINUNFRC  --  0.53 0.29* 0.15*  CREATININE 1.07 1.18  1.19  --   --   CKTOTAL  --  175  --   --     Estimated Creatinine Clearance: 68.1 mL/min (by C-G formula based on SCr of 1.19 mg/dL).   Medical History: History reviewed. No pertinent past medical history.  Assessment: [name] who was found down with AMS on 9/4 and brought into the [facility]. Work-up on admission revealed a new acute L-MCA CVA. Dopplers done 9/8 show occluded B/L LE arteries and pharmacy has been consulted to start heparin for anticoagulation and to hold boluses in the setting of a recent CVA.   The patient was noted to be taken for emergent embolectomies/fasciotomies yesterday evening (9/8) with heparin resumed post-op. Per VVS, the plan is to likely go back to the OR on 9/10 to close the fasciotomies.   Heparin level remains low at 0.15. Heparin was not off at any point today per RN.   Goal of Therapy:  Heparin level 0.3-0.5 units/ml Monitor platelets by anticoagulation protocol: Yes   Plan:  Increase heparin gtt to 1150 units/hr Check an 8 hr heparin level Daily heparin level and CBC  Lysle Pearlachel Keyshawna Prouse, PharmD, BCPS 08/27/2017 5:30 PM

## 2017-08-27 NOTE — Progress Notes (Signed)
Subjective:  Events since yesterday reviewed.  Found to have bilateral involving occlusions of leg and underwent fasciotomy as well as embolectomy bilaterally.  He is currently sitting up in a chair and is still densely a phasic.  States that he has some leg pain and also states that he is mildly short of breath.  No chest pain. Objective:  Vital Signs in the last 24 hours: BP 111/74   Pulse (!) 45   Temp 98.1 F (36.7 C) (Oral)   Resp 17   Ht 6\' 1"  (1.854 m)   Wt 80.9 kg (178 lb 5.6 oz)   SpO2 98%   BMI 23.53 kg/m   Physical Exam: Pleasant male in no acute distress sitting up in chair and did not in respiratory distress Lungs:  Clear  Cardiac:  Slow Regular rhythm, normal S1 and S2, no S3 Abdomen:  Soft, nontender, no masses Extremities:  Bilateral dressings on lower extremities Neuro: Aphasic, dense right hemiparesis  Intake/Output from previous day: 09/08 0701 - 09/09 0700 In: 4717.9 [P.O.:350; I.V.:4317.9; IV Piggyback:50] Out: 3800 [Urine:2950; Blood:850] Weight Filed Weights   08/23/17 0210 08/23/17 0500 08/23/17 2200  Weight: 78.5 kg (173 lb 1 oz) 78.5 kg (173 lb 1 oz) 80.9 kg (178 lb 5.6 oz)    Lab Results: Basic Metabolic Panel:  Recent Labs  16/09/9608/07/18 0709 08/27/17 0411  NA 144 139  137  K 4.6 4.6  4.4  CL 115* 105  103  CO2 22 27  26   GLUCOSE 86 147*  146*  BUN 22* 12  12  CREATININE 1.07 1.18  1.19    CBC:  Recent Labs  08/27/17 0411  WBC 13.3*  13.4*  HGB 13.1  12.9*  HCT 38.4*  38.2*  MCV 89.5  89.9  PLT 154  148*   PROTIME: Lab Results  Component Value Date   INR 1.09 08/22/2017    Telemetry: Sinus bradycardia with PVCs and occasional couplets, rates sometimes get into the 30s  Assessment/Plan:  1.  Asymptomatic sinus bradycardia 2.  Peripheral vascular disease with Recent bilateral embolectomies 3.  Recent large left brain stroke  Recommendations:  Cut back IV fluids at this point since he complains of mild  shortness of breath.  Still with some sinus bradycardia but pressure is well maintained.  He will need a hypercoagulable workup at some point.  Awaiting TEE.     Darden PalmerW. Spencer Vasilis Luhman, Jr.  MD [facility]  08/27/2017, 1:04 PM

## 2017-08-27 NOTE — Progress Notes (Signed)
Occupational Therapy Treatment Patient Details Name: [name] [name] MRN: [id number] DOB: 1951-06-07 Today's Date: 08/27/2017    History of present illness Patient is a 66 y/o male who was found down at home covered in urine. 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 No PMH as pt has not been to the doctor. Noted bilateral leg ischemia on 9/8; to OR for Bilateral embolectomy iliac femoral popliteal, 4 compartment fasciotomy bilaterally that evening, with plans to go to OR for closure of fasciotomies soon   OT comments  Pt continues to require max assist +2 for stand pivot transfers. Increased edema noted in RUE; cleaned hand and educated pt on proper position of RUE for edema control and protection. D/c plan remains appropriate. Will continue to follow acutely.   Follow Up Recommendations  [name]    Equipment Recommendations  Other (comment) (TBD at next venue)    Recommendations for Other Services      Precautions / Restrictions Precautions Precautions: Fall Restrictions Weight Bearing Restrictions: No       Mobility Bed Mobility Overal bed mobility: Needs Assistance Bed Mobility: Supine to Sit     Supine to sit: Mod assist     General bed mobility comments: Cues for sequencing and hand placement. Assist to scoot hips with use of bed pad and for balance initially in sitting  Transfers Overall transfer level: Needs assistance Equipment used: 2 person hand held assist Transfers: Sit to/from [employer] Transfers Sit to Stand: Max assist;+2 physical assistance Stand pivot transfers: Max assist;+2 physical assistance       General transfer comment: Assist to boost up to full upright standing; cues for posture and technqiue. Max assist +2 for pivot to chair going to R side.    Balance Overall balance assessment: Needs assistance Sitting-balance support: Feet supported;No upper extremity supported Sitting balance-Leahy Scale: Good Sitting  balance - Comments: Able to sit unsupported EOB x5 minutes with weight shifting     Standing balance-Leahy Scale: Poor                             ADL either performed or assessed with clinical judgement   ADL Overall ADL's : Needs assistance/impaired     Grooming: Maximal assistance;Bed level;Wash/dry hands Grooming Details (indicate cue type and reason): to wash R hand             Lower Body Dressing: Total assistance;Bed level Lower Body Dressing Details (indicate cue type and reason): to don socks Toilet Transfer: Maximal assistance;+2 for physical assistance;Stand-pivot Toilet Transfer Details (indicate cue type and reason): Simulated by stand pviot EOB to chair         Functional mobility during ADLs: Maximal assistance;+2 for physical assistance (for stand pivot only) General ADL Comments: Educated pt on elevation of RUE for edema control and protection; positioned properly at end of session.     Vision       Perception     Praxis      Cognition Arousal/Alertness: Awake/alert Behavior During Therapy: Flat affect Overall Cognitive Status: Difficult to assess Area of Impairment: Following commands;Problem solving                       Following Commands: Follows one step commands consistently;Follows one step commands with increased time     Problem Solving: Requires verbal cues;Requires tactile cues (Requires visual cues) General Comments: inconsistent responses to questions but able  to follow commands well with visual and verbal cues        Exercises     Shoulder Instructions       General Comments      Pertinent Vitals/ Pain       Pain Assessment: No/denies pain  Home Living                                          Prior Functioning/Environment              Frequency  Min 3X/week        Progress Toward Goals  OT Goals(current goals can now be found in the care plan section)  Progress  towards OT goals: Progressing toward goals  Acute Rehab OT Goals Patient Stated Goal: none stated but most likely to return to PLOF OT Goal Formulation: With patient  Plan Discharge plan remains appropriate    Co-evaluation    PT/OT/SLP Co-Evaluation/Treatment: Yes Reason for Co-Treatment: For patient/therapist safety;Complexity of the patient's impairments (multi-system involvement);To address functional/ADL transfers   OT goals addressed during session: ADL's and self-care      AM-PAC PT "6 Clicks" Daily Activity     Outcome Measure   Help from another person eating meals?: A Little Help from another person taking care of personal grooming?: A Lot Help from another person toileting, which includes using toliet, bedpan, or urinal?: A Lot Help from another person bathing (including washing, rinsing, drying)?: A Lot Help from another person to put on and taking off regular upper body clothing?: A Lot Help from another person to put on and taking off regular lower body clothing?: Total 6 Click Score: 12    End of Session Equipment Utilized During Treatment: Gait belt  OT Visit Diagnosis: Unsteadiness on feet (R26.81);Cognitive communication deficit (R41.841);Hemiplegia and hemiparesis Symptoms and signs involving cognitive functions: Cerebral infarction Hemiplegia - Right/Left: Right Hemiplegia - dominant/non-dominant: Dominant Hemiplegia - caused by: Cerebral infarction   Activity Tolerance Patient tolerated treatment well   Patient Left in chair;with call bell/phone within reach;with chair alarm set   Nurse Communication Mobility status        Time: [id number] OT Time Calculation (min): 23 min  Charges: OT General Charges $OT Visit: 1 Visit OT Treatments $Self Care/Home Management : 8-22 mins  [name] [name] A. [name] [name], M.S., OTR/L Pager: [phone]   Gaye AlkenBailey A Caisen Mangas 08/27/2017, 11:49 AM

## 2017-08-27 NOTE — Progress Notes (Signed)
[facility]  Subjective  - awake sorta follows commands   Objective 94/61 (!) 47 (!) 97.5 F (36.4 C) (Oral) 13 98%  Intake/Output Summary (Last 24 hours) at 08/27/17 0347 Last data filed at 08/27/17 0134  Gross per 24 hour  Intake          3888.38 ml  Output             3600 ml  Net           288.38 ml   Right leg PT brisk doppler nearly biphasic no DP Left leg PT peroneal doppler no DP No groin hematoma No fasciotomy bleeding  Assessment/Planning:  Viable extremities with adequate flow Probably back to OR tomorrow to close fasciotomies Would continue heparin.  Legs had appearance of cardioembolic event.  [name] [name] 08/27/2017 3:47 AM --  Laboratory Lab Results: No results for input(s): WBC, HGB, HCT, PLT in the last 72 hours. BMET  Recent Labs  08/24/17 0647 08/25/17 0709  NA 147* 144  K 3.9 4.6  CL 114* 115*  CO2 24 22  GLUCOSE 84 86  BUN 25* 22*  CREATININE 1.20 1.07  CALCIUM 8.5* 8.4*    COAG Lab Results  Component Value Date   INR 1.09 08/22/2017   No results found for: PTT     

## 2017-08-27 NOTE — Progress Notes (Signed)
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  Goal of Therapy:  Heparin level 0.3-0.5 units/ml   Plan:  Will decrease heparin gtt by 1-2 units/kg/hr to 1050 units/hr and check level in 6hr.  Vernard GamblesVeronda Pinchos Topel, PharmD, BCPS  08/27/2017,5:00 AM

## 2017-08-27 NOTE — Progress Notes (Addendum)
PROGRESS NOTE    [name] [name]  [name] [name]:[id number] DOB: 1951/02/25 DOA: 08/22/2017 PCP: Patient, No Pcp Per   Chief Complaint  Patient presents with  . Altered Mental Status    Brief Narrative:  HPI on 08/22/2017 by [name] OUMAR MARCOTT is a 66 y.o. male with unknown prior medical history, per ED notes a family member found him on the floor in urine, they went by his home because he hadn't heard from him in two days. They asked the mother who was at the home how long this had been going and she also stated two days. Upon presentation the patient was completely flaccid R side, oriented name only, unable to speak and wouldn't follow commands. 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  We are asked to see for admission.   Patient unable to speak, but does weakly respond to simple questions.   Per pt's step- brother, [name], the patient has never been sick.  Walks every day, doesn't smoke or drink.  He grew up in [place], [place].  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  Never went to the doctor, never complained about anything.  He retired 5-10 yrs ago.  He doesn't take any medication that the step-brother knows of.   Their mother had a stroke last year and the patient has been looking after her. It can be stressful for the stepbrother.     Interim history Found to have acute CVA and transferred to [facility] from [facility]. Neurology consulted and following. Patient also found to have sinus bradycardia, cardiology consulted and appreciated. Patient plan for TEE with possible loop recorder on 08/28/2017. Patient noted to have absent pulses in the lower ext B/L, doppler showed total occlusions of arteries. Placed on IV heparin, vascular surgery consulted- s/p fasciotomy.  Assessment & Plan   Acute left MCA CVA -Presented with right-sided hemoperfusion as well as expressive aphasia -CT head: Acute left MCA territory infarct with left basal  gain suspected petechial hemorrhage, 3 mm rightward midline shift -MRI brain: Large acute left MCA territory infarct most confluent along the insula, operular, basal ganglia, with petechial hemorrhage within the left striatum, cytotoxic edema causes 5mm of midlinie shift.  -MRA head: Left M1 occlusion, advanced right P2 stenosis -Carotid Doppler: Bilateral ICA 1-39% stenosis, vertebral artery flow antegrade -Echocardiogram shows an EF of 55%, no source of emboli -LDL 78, hemoglobin A1c 5.2 -Speech therapy working with patient, currently on dysphagia 1 diet -Continue aspirin and statin -Cardiology consulted for TEE and possible loop recorder, will occur on 08/28/2017 -PT/OT consulted and recommended [name] -Inpatient reheb consulted  Peripheral vascular disease/ Bilateral lower extremity Ischemia -LE cool to touch, unable to palpate pulses  -obtained LE doppler: unremarkable for DVT. Incidental findings of age intermediate obstructive thrombosis involving the common femoral, superficial femoral, popliteal, posterior tibial, peroneal, anterior tibial arteries bilaterally -ABI was cancelled -Discussed with neurology the use of heparin, Dr. Roda Shutters recommended using low dose heparin without a bolus.  -Vascular surgery consultation appreciated.  -placed on heparin drip -CTA AO+BiFEM: Near occlusive thrombus within right common iliac vessel with occlusive thrombus involving the right external iliac, common femoral, superficial femoral, right popliteal vessels. Occlusive thrombus within proximal profunda artery with distal flow present. Near occlusive to occlusive thrombus with the left external iliac artery. Nonocclusive thrombus in the celiac trunk which extends to the left splenic artery. Wedge shaped hypodensity in the spleen suspicious for an infarct. -s/p  Bilateral embolectomy iliac femoral popliteal, 4 compartment fasciotomy bilaterally  -plan for possible closure of fasciotomies on 08/28/17  Sinus  bradycardia -HR in the 30s and patient asymptomatic -HR does increase with exertion -Cardiology consultation appreciated feels this could be due to increased vagal tone following CVA -Does not feel patient needs pacing at this time  Dysuria -UA unremarkable for infection x 2, UA did show large hemoglobin -suspect secondary to  -Continue to monitor    Dehydration/hypernatremia -Placed on IV fluids, sodium peaked to 150 -IVF were changed to D5W, sodium 139 -Discontinued IVF -Continue to monitor BMP  Acute kidney injury -Possibly secondary to dehydration and acute mild rhabdomyolysis -Presented with creatinine of 1.43, placed on IV fluids -Creatinine improved to 1.18 -Continue to monitor BMP  Mild rhabdomyolysis -Likely secondary to the above -CK down to 175  Leukocytosis -Resolved, Suspect reactive to CVA -Patient currently afebrile -Blood cultures show no growth to date -UA and chest x-ray unremarkable for infection  Hyperlipidemia -started on statin  DVT Prophylaxis   heparin  Code Status: Full  Family Communication: None at bedside  Disposition Plan: Admitted.Continue to monitor in stepdown  Consultants Neurology Cardiology Vascular surgery Inpatient rehab  Procedures  Echocardiogram Lower extremity doppler  Antibiotics   Anti-infectives    Start     Dose/Rate Route Frequency Ordered Stop   08/27/17 0500  cefUROXime (ZINACEF) 1.5 g in dextrose 5 % 50 mL IVPB     1.5 g 100 mL/hr over 30 Minutes Intravenous Every 12 hours 08/26/17 2132 08/28/17 0459   08/26/17 1600  cefUROXime (ZINACEF) 1.5 g in dextrose 5 % 50 mL IVPB     1.5 g 100 mL/hr over 30 Minutes Intravenous To Surgery 08/26/17 1543 08/27/17 0502   08/22/17 1715  vancomycin (VANCOCIN) IVPB 1000 mg/200 mL premix     1,000 mg 200 mL/hr over 60 Minutes Intravenous  Once 08/22/17 1710 08/22/17 1935   08/22/17 1715  piperacillin-tazobactam (ZOSYN) IVPB 3.375 g     3.375 g 100 mL/hr over 30 Minutes  Intravenous  Once 08/22/17 1710 08/22/17 1838      Subjective:   Deric Clair seen and examined today.  Continue to be aphasic. Denies chest pain, shortness of breath, abdominal pain, nausea or vomiting. Denies numbness or tingling.   Objective:   Vitals:   08/27/17 0400 08/27/17 0500 08/27/17 0600 08/27/17 0700  BP: 112/77 106/70 104/62 111/74  Pulse: (!) 41 (!) 48 (!) 50 (!) 45  Resp: Temp:    98.1 F (36.7 C)  TempSrc:    Oral  SpO2: 98% 96% 97% 98%  Weight:      Height:        Intake/Output Summary (Last 24 hours) at 08/27/17 1143 Last data filed at 08/27/17 0600  Gross per 24 hour  Intake          3592.93 ml  Output             3000 ml  Net           592.93 ml   Filed Weights   08/23/17 0210 08/23/17 0500 08/23/17 2200  Weight: 78.5 kg (173 lb 1 oz) 78.5 kg (173 lb 1 oz) 80.9 kg (178 lb 5.6 oz)   Exam  General: Well developed, well nourished, NAD  HEENT: NCAT, mucous membranes moist.   Cardiovascular: S1 S2 auscultated, no murmurs, bradycardic  Respiratory: Clear to auscultation bilaterally with equal chest rise  Abdomen: Soft, nontender, nondistended, +  bowel sounds  Extremities: warm dry without cyanosis clubbing or edema, warmer to touch, dressing in place  Neuro: awake and alert, aphasic, Right sided weakness   Psych: Appropriate, mildly anxious  Data Reviewed: I have personally reviewed following labs and imaging studies  CBC:  Recent Labs Lab 08/22/17 1612 08/22/17 1624 08/23/17 0425 08/24/17 0102 08/27/17 0411  WBC 15.4*  --  14.6* 10.9* 13.3*  13.4*  NEUTROABS 13.1*  --   --   --   --   HGB 17.2* 17.7* 15.2 13.5 13.1  12.9*  HCT 50.6 52.0 46.4 41.4 38.4*  38.2*  MCV 92.8  --  96.3 93.5 89.5  89.9  PLT 185  --  133* 134* 154  148*   Basic Metabolic Panel:  Recent Labs Lab 08/23/17 1007 08/24/17 0102 08/24/17 0647 08/25/17 0709 08/27/17 0411  NA 147* 147* 147* 144 139  137  K 4.2 3.7 3.9 4.6 4.6  4.4    CL 117* 116* 114* 115* 105  103  CO2 22 25 24 22 27  26   GLUCOSE 99 84 84 86 147*  146*  BUN 34* 25* 25* 22* 12  12  CREATININE 1.24 1.17 1.20 1.07 1.18  1.19  CALCIUM 8.5* 8.4* 8.5* 8.4* 8.3*  8.2*   GFR: Estimated Creatinine Clearance: 68.1 mL/min (by C-G formula based on SCr of 1.19 mg/dL). Liver Function Tests:  Recent Labs Lab 08/22/17 1612 08/23/17 1007  AST 47* 33  ALT 30 26  ALKPHOS 56 44  BILITOT 1.5* 1.3*  PROT 8.2* 6.4*  ALBUMIN 4.1 3.1*   No results for input(s): LIPASE, AMYLASE in the last 168 hours.  Recent Labs Lab 08/22/17 1643  AMMONIA 23   Coagulation Profile:  Recent Labs Lab 08/22/17 1612  INR 1.09   Cardiac Enzymes:  Recent Labs Lab 08/22/17 1650 08/22/17 2155 08/23/17 0425 08/23/17 1007 08/27/17 0411  CKTOTAL 709*  --   --  380 175  TROPONINI  --  <0.03 <0.03 <0.03  --    BNP (last 3 results) No results for input(s): PROBNP in the last 8760 hours. HbA1C: No results for input(s): HGBA1C in the last 72 hours. CBG:  Recent Labs Lab 08/23/17 2249 08/24/17 0353 08/26/17 2105 08/27/17 0839  GLUCAP 86 79 92 126*   Lipid Profile: No results for input(s): CHOL, HDL, LDLCALC, TRIG, CHOLHDL, LDLDIRECT in the last 72 hours. Thyroid Function Tests: No results for input(s): TSH, T4TOTAL, FREET4, T3FREE, THYROIDAB in the last 72 hours. Anemia Panel: No results for input(s): VITAMINB12, FOLATE, FERRITIN, TIBC, IRON, RETICCTPCT in the last 72 hours. Urine analysis:    Component Value Date/Time   COLORURINE YELLOW 08/25/2017 1026   APPEARANCEUR HAZY (A) 08/25/2017 1026   LABSPEC 1.016 08/25/2017 1026   PHURINE 5.0 08/25/2017 1026   GLUCOSEU NEGATIVE 08/25/2017 1026   HGBUR LARGE (A) 08/25/2017 1026   BILIRUBINUR NEGATIVE 08/25/2017 1026   KETONESUR NEGATIVE 08/25/2017 1026   PROTEINUR 30 (A) 08/25/2017 1026   NITRITE NEGATIVE 08/25/2017 1026   LEUKOCYTESUR MODERATE (A) 08/25/2017 1026   Sepsis  Labs: @LABRCNTIP (procalcitonin:4,lacticidven:4)  ) Recent Results (from the past 240 hour(s))  Blood Culture (routine x 2)     Status: None   Collection Time: 08/22/17  4:40 PM  Result Value Ref Range Status   Specimen Description RIGHT ANTECUBITAL  Final   Special Requests   Final    BOTTLES DRAWN AEROBIC AND ANAEROBIC Blood Culture adequate volume   Culture NO GROWTH 5 DAYS  Final  Report Status 08/27/2017 FINAL  Final  Blood Culture (routine x 2)     Status: None   Collection Time: 08/22/17  4:44 PM  Result Value Ref Range Status   Specimen Description BLOOD LEFT HAND  Final   Special Requests   Final    BOTTLES DRAWN AEROBIC AND ANAEROBIC Blood Culture adequate volume   Culture NO GROWTH 5 DAYS  Final   Report Status 08/27/2017 FINAL  Final  MRSA PCR Screening     Status: None   Collection Time: 08/23/17  1:59 AM  Result Value Ref Range Status   MRSA by PCR NEGATIVE NEGATIVE Final    Comment:        The GeneXpert MRSA Assay (FDA approved for NASAL specimens only), is one component of a comprehensive MRSA colonization surveillance program. It is not intended to diagnose MRSA infection nor to guide or monitor treatment for MRSA infections.   Urine Culture     Status: None   Collection Time: 08/25/17 10:26 AM  Result Value Ref Range Status   Specimen Description URINE, CLEAN CATCH  Final   Special Requests NONE  Final   Culture NO GROWTH  Final   Report Status 08/26/2017 FINAL  Final      Radiology Studies: Ct Angio Ao+bifem W & Or Wo Contrast  Result Date: 08/26/2017 CLINICAL DATA:  Assess for obstructing thrombus involving the bilateral lateral extremity vessels EXAM: CT ANGIOGRAPHY OF ABDOMINAL AORTA WITH ILIOFEMORAL RUNOFF TECHNIQUE: Multidetector CT imaging of the abdomen, pelvis and lower extremities was performed using the standard protocol during bolus administration of intravenous contrast. Multiplanar CT image reconstructions and MIPs were obtained to  evaluate the vascular anatomy. CONTRAST:  100 mL Isovue 370  certainty intravenous COMPARISON:  None. FINDINGS: VASCULAR Aorta: No aneurysmal dilatation. Mild eccentric thrombus in the distal aorta above the bifurcation. Scattered atherosclerotic calcification. Celiac: Linear thrombus in the celiac trunk. This extends into the proximal splenic artery. Minimal thrombus at the origin of the common hepatic artery. There is distal vessel patency. SMA: Patent without evidence of aneurysm, dissection, vasculitis or significant stenosis. Renals: Both renal arteries are patent without evidence of aneurysm, dissection, vasculitis, fibromuscular dysplasia or significant stenosis. IMA: Patent without evidence of aneurysm, dissection, vasculitis or significant stenosis. RIGHT Lower Extremity Inflow: Near occlusive thrombus in the right common iliac. Occlusive thrombus in the internal iliac division with enhancement of distal right pelvic vessels. Occlusive thrombus within the external iliac vessel. Outflow: Occlusive thrombus within the right common femoral, superficial femoral, and popliteal vessels. Occlusive thrombus in the proximal profunda artery with enhancement of vessels in the thigh. Runoff: Near occlusive thrombus in the tibial peroneal trunk. Single vessel runoff via the posterior tibial artery. Severe disease and distal occlusion of the anterior tibial artery. Diminutive peroneal artery which is visualized to the mid lower leg. LEFT Lower Extremity Inflow: Left common femoral artery is patent. Near occlusive thrombus within the proximal external iliac vessel with occlusive thrombus in the distal external iliac vessel. Internal iliac artery is patent. Outflow: Occlusive thrombus in the common femoral and proximal to mid superficial femoral artery with some reconstitution of flow in the mid to distal superficial femoral artery which then becomes occluded distally. 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 Profunda artery is patent. Runoff: Occlusive thrombus in the tibial peroneal trunk. Single vessel runoff via the posterior tibial artery. Diffusely disease anterior tibial artery and peroneal artery. Peroneal and anterior tibial artery is visualized to  the level of the mid lower leg Veins: No obvious venous abnormality within the limitations of this arterial phase study. Review of the MIP images confirms the above findings. NON-VASCULAR Lower chest: Lung bases demonstrate no acute consolidation or pleural effusion. Patchy dependent atelectasis. Cardiomegaly. Hepatobiliary: Calcified gallstone.  No focal hepatic abnormality Pancreas: Unremarkable. No pancreatic ductal dilatation or surrounding inflammatory changes. Spleen: Wedge-shaped hypodensity within the posterior spleen suspect for splenic infarct. Adrenals/Urinary Tract: Adrenal glands are within normal limits. Bilateral renal cortical scarring. No hydronephrosis. The bladder demonstrates mild irregular wall thickening Stomach/Bowel: Stomach is within normal limits. No evidence of bowel wall thickening, distention, or inflammatory changes. Lymphatic: No significantly enlarged abdominal or pelvic lymph nodes. Reproductive: Enlarged prostate with mass effect on the bladder posteriorly Other: Negative for free air or free fluid Musculoskeletal: No acute or suspicious bone lesions. Degenerative changes of the spine. IMPRESSION: VASCULAR 1. Near occlusive thrombus within the right common iliac vessel with occlusive thrombus involving the right external iliac, common femoral, superficial femoral, and right popliteal vessels. Occlusive thrombus within the proximal profunda artery with distal flow present. Reconstituted flow at the trifurcation with single vessel runoff via the posterior tibial artery on the right. 2. Near occlusive to occlusive thrombus within the left external iliac artery with occlusive thrombus visualized in the  left common femoral and superficial femoral artery. Reconstitution of some flow in the mid to distal SFA with thrombosis of the popliteal artery. 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 Single vessel runoff via the left posterior tibial artery. 3. Nonocclusive thrombus in the celiac trunk which extends to the left splenic artery. Wedge-shaped hypodensity in the spleen is suspicious for an infarct. Small amount of thrombus at the origin of the common hepatic artery. NON-VASCULAR 1. Wedge-shaped hypodensity in the spleen suspicious for infarct 2. Gallstone 3. Enlarged prostate with mass effect on the bladder Review of patient's epic chart at the time of dictation indicates that the images were reviewed by Dr. Darrick Penna with pertinent findings made and the patient was in the OR at the time of dictation. Electronically Signed   By: Jasmine Pang M.D.   On: 08/26/2017 17:17     Scheduled Meds: . aspirin  325 mg Oral Daily  . atorvastatin  20 mg Oral q1800  . chlorhexidine  15 mL Mouth Rinse BID  . docusate sodium  100 mg Oral Daily  . mouth rinse  15 mL Mouth Rinse q12n4p  . pantoprazole  40 mg Oral Daily   Continuous Infusions: . cefUROXime (ZINACEF)  IV Stopped (08/27/17 0451)  . dextrose 5 % and 0.45% NaCl 100 mL/hr at 08/27/17 0840  . heparin 1,050 Units/hr (08/27/17 1018)  . magnesium sulfate 1 - 4 g bolus IVPB       LOS: 5 days   Time Spent in minutes   45 minutes  Zeeshan Korte D.O. on 08/27/2017 at 11:43 AM  Between 7am to 7pm - Pager - [phone]  After 7pm go to [email or web address] - password [email or web address]  And look for the night coverage person covering for me after hours  [facility] Office  [phone]

## 2017-08-28 ENCOUNTER — Inpatient Hospital Stay (HOSPITAL_COMMUNITY): Payer: Medicare Other | Admitting: Certified Registered"

## 2017-08-28 ENCOUNTER — Encounter (HOSPITAL_COMMUNITY)
Admission: EM | Disposition: A | Discharge status: Skilled Nursing Facility | Payer: Self-pay | Source: Home / Self Care | Attending: Internal Medicine

## 2017-08-28 ENCOUNTER — Encounter (HOSPITAL_COMMUNITY): Payer: Self-pay | Admitting: Certified Registered"

## 2017-08-28 ENCOUNTER — Encounter (HOSPITAL_COMMUNITY): Payer: Self-pay | Admitting: Cardiovascular Disease

## 2017-08-28 DIAGNOSIS — R4701 Aphasia: Secondary | ICD-10-CM

## 2017-08-28 DIAGNOSIS — I4891 Unspecified atrial fibrillation: Secondary | ICD-10-CM

## 2017-08-28 DIAGNOSIS — R400 Somnolence: Secondary | ICD-10-CM

## 2017-08-28 DIAGNOSIS — N179 Acute kidney failure, unspecified: Secondary | ICD-10-CM

## 2017-08-28 HISTORY — PX: FASCIOTOMY CLOSURE: SHX5829

## 2017-08-28 LAB — CBC
HEMATOCRIT: 35.4 % — AB (ref 39.0–52.0)
Hemoglobin: 11.8 g/dL — ABNORMAL LOW (ref 13.0–17.0)
MCH: 30.1 pg (ref 26.0–34.0)
MCHC: 33.3 g/dL (ref 30.0–36.0)
MCV: 90.3 fL (ref 78.0–100.0)
Platelets: 145 10*3/uL — ABNORMAL LOW (ref 150–400)
RBC: 3.92 MIL/uL — AB (ref 4.22–5.81)
RDW: 12.9 % (ref 11.5–15.5)
WBC: 12.1 10*3/uL — ABNORMAL HIGH (ref 4.0–10.5)

## 2017-08-28 LAB — BPAM RBC
Blood Product Expiration Date: 201809252359
Blood Product Expiration Date: 201809252359
Unit Type and Rh: 6200
Unit Type and Rh: 6200

## 2017-08-28 LAB — BASIC METABOLIC PANEL
ANION GAP: 7 (ref 5–15)
BUN: 18 mg/dL (ref 6–20)
CHLORIDE: 105 mmol/L (ref 101–111)
CO2: 26 mmol/L (ref 22–32)
Calcium: 8.2 mg/dL — ABNORMAL LOW (ref 8.9–10.3)
Creatinine, Ser: 1.12 mg/dL (ref 0.61–1.24)
GFR calc non Af Amer: >60 mL/min (ref >60)
Glucose, Bld: 118 mg/dL — ABNORMAL HIGH (ref 65–99)
POTASSIUM: 3.9 mmol/L (ref 3.5–5.1)
SODIUM: 138 mmol/L (ref 135–145)

## 2017-08-28 LAB — HEPARIN LEVEL (UNFRACTIONATED)
HEPARIN UNFRACTIONATED: 0.2 [IU]/mL — AB (ref 0.30–0.70)
HEPARIN UNFRACTIONATED: 0.31 [IU]/mL (ref 0.30–0.70)
HEPARIN UNFRACTIONATED: 0.23 [IU]/mL — AB (ref 0.30–0.70)

## 2017-08-28 LAB — TYPE AND SCREEN
ABO/RH(D): A POS
Antibody Screen: NEGATIVE
Unit division: 0
Unit division: 0

## 2017-08-28 LAB — GLUCOSE, CAPILLARY: GLUCOSE-CAPILLARY: 97 mg/dL (ref 65–99)

## 2017-08-28 SURGERY — FASCIOTOMY CLOSURE
Anesthesia: Monitor Anesthesia Care | Site: Leg Lower | Laterality: Bilateral

## 2017-08-28 MED ORDER — MIDAZOLAM HCL 2 MG/2ML IJ SOLN
INTRAMUSCULAR | Status: AC
  Filled 2017-08-28: qty 2

## 2017-08-28 MED ORDER — SODIUM CHLORIDE 0.9 % IV BOLUS (SEPSIS)
500.0000 mL | Freq: Once | INTRAVENOUS | Status: AC
  Administered 2017-08-28: 500 mL via INTRAVENOUS

## 2017-08-28 MED ORDER — AMIODARONE LOAD VIA INFUSION
150.0000 mg | Freq: Once | INTRAVENOUS | Status: DC
  Filled 2017-08-28: qty 83.34

## 2017-08-28 MED ORDER — LIDOCAINE HCL (PF) 1 % IJ SOLN
INTRAMUSCULAR | Status: AC
Start: 2017-08-28 — End: 2017-08-28
  Filled 2017-08-28: qty 30

## 2017-08-28 MED ORDER — MORPHINE SULFATE (PF) 4 MG/ML IV SOLN
1.0000 mg | Freq: Once | INTRAVENOUS | Status: AC
  Administered 2017-08-28: 1 mg via INTRAVENOUS
  Filled 2017-08-28: qty 1

## 2017-08-28 MED ORDER — LIDOCAINE HCL (PF) 1 % IJ SOLN
INTRAMUSCULAR | Status: DC | PRN
  Administered 2017-08-28: 15 mL

## 2017-08-28 MED ORDER — FENTANYL CITRATE (PF) 250 MCG/5ML IJ SOLN
INTRAMUSCULAR | Status: AC
  Filled 2017-08-28: qty 5

## 2017-08-28 MED ORDER — 0.9 % SODIUM CHLORIDE (POUR BTL) OPTIME
TOPICAL | Status: DC | PRN
  Administered 2017-08-28: 1000 mL

## 2017-08-28 MED ORDER — AMIODARONE IV BOLUS ONLY 150 MG/100ML
150.0000 mg | Freq: Once | INTRAVENOUS | Status: AC
  Administered 2017-08-28: 150 mg via INTRAVENOUS
  Filled 2017-08-28: qty 100

## 2017-08-28 MED ORDER — LIDOCAINE-EPINEPHRINE (PF) 1 %-1:200000 IJ SOLN
INTRAMUSCULAR | Status: AC
  Filled 2017-08-28: qty 30

## 2017-08-28 MED ORDER — LACTATED RINGERS IV SOLN
INTRAVENOUS | Status: DC
  Administered 2017-08-28: 12:00:00 via INTRAVENOUS

## 2017-08-28 MED ORDER — SODIUM CHLORIDE 0.9 % IV SOLN
INTRAVENOUS | Status: DC
  Administered 2017-08-28 – 2017-08-31 (×7): via INTRAVENOUS

## 2017-08-28 MED ORDER — PROPOFOL 10 MG/ML IV BOLUS
INTRAVENOUS | Status: AC
  Filled 2017-08-28: qty 20

## 2017-08-28 MED ORDER — MIDAZOLAM HCL 5 MG/5ML IJ SOLN
INTRAMUSCULAR | Status: DC | PRN
  Administered 2017-08-28: 2 mg via INTRAVENOUS

## 2017-08-28 MED ORDER — LACTATED RINGERS IV SOLN: INTRAVENOUS | Status: DC

## 2017-08-28 MED ORDER — LIDOCAINE-EPINEPHRINE (PF) 1 %-1:200000 IJ SOLN
INTRAMUSCULAR | Status: DC | PRN
  Administered 2017-08-28: 15 mL

## 2017-08-28 SURGICAL SUPPLY — 33 items
BAG ISOLATION DRAPE 18X18 (DRAPES) ×2 IMPLANT
BANDAGE ACE 4X5 VEL STRL LF (GAUZE/BANDAGES/DRESSINGS) IMPLANT
BANDAGE ACE 6X5 VEL STRL LF (GAUZE/BANDAGES/DRESSINGS) IMPLANT
BNDG GAUZE ELAST 4 BULKY (GAUZE/BANDAGES/DRESSINGS) IMPLANT
CANISTER SUCT 3000ML PPV (MISCELLANEOUS) ×2 IMPLANT
DRAPE ISOLATION BAG 18X18 (DRAPES) ×2
DRSG COVADERM 4X8 (GAUZE/BANDAGES/DRESSINGS) ×2 IMPLANT
ELECT REM PT RETURN 9FT ADLT (ELECTROSURGICAL) ×2
ELECTRODE REM PT RTRN 9FT ADLT (ELECTROSURGICAL) ×1 IMPLANT
GAUZE SPONGE 4X4 12PLY STRL (GAUZE/BANDAGES/DRESSINGS) ×2 IMPLANT
GLOVE BIO SURGEON STRL SZ7.5 (GLOVE) ×2 IMPLANT
GLOVE SKINSENSE NS SZ7.0 (GLOVE) ×2
GLOVE SKINSENSE STRL SZ7.0 (GLOVE) ×2 IMPLANT
GOWN STRL REUS W/ TWL LRG LVL3 (GOWN DISPOSABLE) ×1 IMPLANT
GOWN STRL REUS W/ TWL XL LVL3 (GOWN DISPOSABLE) ×2 IMPLANT
GOWN STRL REUS W/TWL LRG LVL3 (GOWN DISPOSABLE) ×1
GOWN STRL REUS W/TWL XL LVL3 (GOWN DISPOSABLE) ×2
KIT BASIN OR (CUSTOM PROCEDURE TRAY) ×2 IMPLANT
KIT ROOM TURNOVER OR (KITS) ×2 IMPLANT
NEEDLE 22X1 1/2 (OR ONLY) (NEEDLE) ×2 IMPLANT
NEEDLE HYPO 25GX1X1/2 BEV (NEEDLE) ×2 IMPLANT
NS IRRIG 1000ML POUR BTL (IV SOLUTION) ×2 IMPLANT
PACK GENERAL/GYN (CUSTOM PROCEDURE TRAY) ×2 IMPLANT
PACK UNIVERSAL I (CUSTOM PROCEDURE TRAY) IMPLANT
PAD ARMBOARD 7.5X6 YLW CONV (MISCELLANEOUS) ×4 IMPLANT
STAPLER VISISTAT 35W (STAPLE) ×4 IMPLANT
SUT ETHILON 3 0 PS 1 (SUTURE) IMPLANT
SUT VIC AB 2-0 CTX 36 (SUTURE) IMPLANT
SUT VIC AB 3-0 SH 27 (SUTURE)
SUT VIC AB 3-0 SH 27X BRD (SUTURE) IMPLANT
SUT VICRYL 4-0 PS2 18IN ABS (SUTURE) IMPLANT
SYRINGE CONTROL L 12CC (SYRINGE) ×2 IMPLANT
WATER STERILE IRR 1000ML POUR (IV SOLUTION) ×2 IMPLANT

## 2017-08-28 NOTE — Progress Notes (Signed)
ANTICOAGULATION CONSULT NOTE - Follow Up Consult  Pharmacy Consult for heparin Indication: occluded B/L LE arteries in setting of acute CVA  Labs:  Recent Labs  08/25/17 0709  08/27/17 0411 08/27/17 0958 08/27/17 1558 08/28/17 0207  HGB  --   --  13.1  12.9*  --   --  11.8*  HCT  --   --  38.4*  38.2*  --   --  35.4*  PLT  --   --  154  148*  --   --  145*  HEPARINUNFRC  --   < > 0.53 0.29* 0.15* 0.20*  CREATININE 1.07  --  1.18  1.19  --   --  1.12  CKTOTAL  --   --  175  --   --   --   < > = values in this interval not displayed.  Assessment: 66yo male remains below goal on heparin after rate changes.  Goal of Therapy:  Heparin level 0.3-0.5 units/ml   Plan:  Will increase heparin gtt by 2 units/kg/hr to 1300 units/hr and check level in 6hr.  Vernard GamblesVeronda Adalei Novell, PharmD, BCPS  08/28/2017,3:49 AM

## 2017-08-28 NOTE — Progress Notes (Signed)
Call to Dr Louanne BeltonFiles by Pauletta BrownsMark B, RN- doppler PT pulses bilateral- also putting in post op orders @1500 

## 2017-08-28 NOTE — Progress Notes (Signed)
ANTICOAGULATION CONSULT NOTE - Follow Up Consult  Pharmacy Consult for Heparin Indication: Occludeded arteries LE B/L  No Known Allergies  Patient Measurements: Height: 6\' 1"  (185.4 cm) Weight: 178 lb 5.6 oz (80.9 kg) IBW/kg (Calculated) : 79.9 Heparin Dosing Weight: 80.9 kg  Vital Signs: Temp: 98.3 F (36.8 C) (09/10 0836) Temp Source: Oral (09/10 0836) BP: 125/80 (09/10 0836) Pulse Rate: 52 (09/10 0836)  Labs:  Recent Labs  08/27/17 0411  08/27/17 1558 08/28/17 0207 08/28/17 0905  HGB 13.1  12.9*  --   --  11.8*  --   HCT 38.4*  38.2*  --   --  35.4*  --   PLT 154  148*  --   --  145*  --   HEPARINUNFRC 0.53  < > 0.15* 0.20* 0.31  CREATININE 1.18  1.19  --   --  1.12  --   CKTOTAL 175  --   --   --   --   < > = values in this interval not displayed.  Estimated Creatinine Clearance: 72.3 mL/min (by C-G formula based on SCr of 1.12 mg/dL).  Medications: Heparin @ 1300 units/hr  Assessment: [name] who was found down with AMS on 9/4 and brought into the [facility]. Work-up on admission revealed a new acute L-MCA CVA. Dopplers done 9/8 show occluded B/L LE arteries and pharmacy was consulted to start heparin for anticoagulation and to hold boluses in the setting of a recent CVA.   The patient was noted to be taken for emergent embolectomies/fasciotomies 9/8 with heparin resumed post-op. Per [facility], the plan is to go back to the OR today to close the fasciotomies.   Heparin level is therapeutic at 0.31. CBC stable.   Goal of Therapy:  Heparin level 0.3-0.5 units/ml Monitor platelets by anticoagulation protocol: Yes   Plan:  1) Continue heparin at 1300 units/hr 2) Follow up after OR  [name] CasaJennifer Aurelia [name], PharmD, BCPS 08/28/2017 10:28 AM

## 2017-08-28 NOTE — Progress Notes (Signed)
Central tele notified this nurse that patient is in Afib, heart rate ranging from 120's to 150's. Notified Dr. Catha GosselinMikhail. Will have cardiology come see patient. Patient resting. Denies pain or discomfort at this time. Will continue to monitor.

## 2017-08-28 NOTE — Plan of Care (Signed)
Problem: Education: Goal: Knowledge of disease or condition will improve Outcome: Progressing Discussed with patient plan of care for the evening, pain management and dangling legs with some teach back displayed.

## 2017-08-28 NOTE — Transfer of Care (Signed)
Immediate Anesthesia Transfer of Care Note  Patient: [name] [name]  Procedure(s) Performed: Procedure(s): FASCIOTOMY CLOSURE/BILATERAL  lower legs (Bilateral)  Patient Location: PACU  Anesthesia Type:MAC  Level of Consciousness: awake, oriented and patient cooperative  Airway & Oxygen Therapy: Patient Spontanous Breathing and Patient connected to nasal cannula oxygen  Post-op Assessment: Report given to RN and Post -op Vital signs reviewed and stable  Post vital signs: Reviewed and stable  Last Vitals:  Vitals:   08/28/17 0700 08/28/17 0836  BP: (!) 143/74 125/80  Pulse: 62 (!) 52  Resp: 17 13  Temp:  36.8 C  SpO2: 90% 91%    Last Pain:  Vitals:   08/28/17 0836  TempSrc: Oral  PainSc:       Patients Stated Pain Goal: 2 (92/44/62 8638)  Complications: No apparent anesthesia complications

## 2017-08-28 NOTE — Progress Notes (Signed)
PROGRESS NOTE    [name] [name]  ZOX:[id number] DOB: October 02, 1951 DOA: 08/22/2017 PCP: Patient, No Pcp Per   Chief Complaint  Patient presents with  . Altered Mental Status    Brief Narrative:  HPI on 08/22/2017 by [name] [name] is a 66 y.o. male with unknown prior medical history, per ED notes a family member found him on the floor in urine, they went by his home because he hadn't heard from him in two days. They asked the mother who was at the home how long this had been going and she also stated two days. Upon presentation the patient was completely flaccid R side, oriented name only, unable to speak and wouldn't follow commands. 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  We are asked to see for admission.   Patient unable to speak, but does weakly respond to simple questions.   Per pt's step- brother, [name], the patient has never been sick.  Walks every day, doesn't smoke or drink.  He grew up in [place], [place].  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  Never went to the doctor, never complained about anything.  He retired 5-10 yrs ago.  He doesn't take any medication that the step-brother knows of.   Their mother had a stroke last year and the patient has been looking after her. It can be stressful for the stepbrother.     Interim history Found to have acute CVA and transferred to [facility] from [facility]. Neurology consulted and following. Patient also found to have sinus bradycardia, cardiology consulted and appreciated. Patient plan for TEE with possible loop recorder on 08/28/2017. Patient noted to have absent pulses in the lower ext B/L, doppler showed total occlusions of arteries. Placed on IV heparin, vascular surgery consulted- s/p fasciotomy.Patient also developed A fib with RVR.  Assessment & Plan   Acute left MCA CVA -Presented with right-sided hemoperfusion as well as expressive aphasia -CT head: Acute left  MCA territory infarct with left basal gain suspected petechial hemorrhage, 3 mm rightward midline shift -MRI brain: Large acute left MCA territory infarct most confluent along the insula, operular, basal ganglia, with petechial hemorrhage within the left striatum, cytotoxic edema causes 5mm of midlinie shift.  -MRA head: Left M1 occlusion, advanced right P2 stenosis -Carotid Doppler: Bilateral ICA 1-39% stenosis, vertebral artery flow antegrade -Echocardiogram shows an EF of 55%, no source of emboli -LDL 78, hemoglobin A1c 5.2 -Speech therapy working with patient, currently on dysphagia 1 diet -Continue aspirin and statin -Cardiology consulted for TEE and possible loop recorder, will now occur on 08/30/2017 -PT/OT consulted and recommended [name] -Inpatient reheb consulted  Peripheral vascular disease/ Bilateral lower extremity Ischemia -LE cool to touch, unable to palpate pulses  -obtained LE doppler: unremarkable for DVT. Incidental findings of age intermediate obstructive thrombosis involving the common femoral, superficial femoral, popliteal, posterior tibial, peroneal, anterior tibial arteries bilaterally -ABI was cancelled -Discussed with neurology the use of heparin, Dr. Roda Shutters recommended using low dose heparin without a bolus.  -Vascular surgery consultation appreciated.  -placed on heparin drip -CTA AO+BiFEM: Near occlusive thrombus within right common iliac vessel with occlusive thrombus involving the right external iliac, common femoral, superficial femoral, right popliteal vessels. Occlusive thrombus within proximal profunda artery with distal flow present. Near occlusive to occlusive thrombus with the left external iliac artery. Nonocclusive thrombus in the celiac trunk which extends to the left splenic artery. Wedge shaped hypodensity in  the spleen suspicious for an infarct. -s/p Bilateral embolectomy iliac femoral popliteal, 4 compartment fasciotomy bilaterally  -plan for possible  closure of fasciotomies on 08/28/17  New Atrial fibrillation with RVR -Cardiology consulted and appreciated -currently on heparin -started on amiodarone  -Discussed with cardiology, question possibility of sick sinus syndrome  Sinus bradycardia -HR in the 30s and patient asymptomatic -HR does increase with exertion -Cardiology consultation appreciated feels this could be due to increased vagal tone following CVA -Does not feel patient needs pacing at this time  Dysuria -UA unremarkable for infection x 2, UA did show large hemoglobin -suspect secondary to  -Continue to monitor    Dehydration/hypernatremia -Placed on IV fluids, sodium peaked to 150 -IVF were changed to D5W, sodium 138 -Discontinued IVF -Continue to monitor BMP  Acute kidney injury -Possibly secondary to dehydration and acute mild rhabdomyolysis -Presented with creatinine of 1.43, placed on IV fluids -Creatinine improved to 1.12 -Continue to monitor BMP  Mild rhabdomyolysis -Likely secondary to the above -CK down to 175  Leukocytosis -Resolved, Suspect reactive to CVA -Patient currently afebrile -Blood cultures show no growth to date -UA and chest x-ray unremarkable for infection  Hyperlipidemia -started on statin  DVT Prophylaxis   heparin  Code Status: Full  Family Communication: None at bedside  Disposition Plan: Admitted.Continue to monitor in stepdown  Consultants Neurology Cardiology Vascular surgery Inpatient rehab  Procedures  Echocardiogram Lower extremity doppler Lower ext 4 compartment fasciotomy bilaterally  Antibiotics   Anti-infectives    Start     Dose/Rate Route Frequency Ordered Stop   08/28/17 0600  [MAR Hold]  cefUROXime (ZINACEF) 1.5 g in dextrose 5 % 50 mL IVPB     (MAR Hold since 08/28/17 1138)   1.5 g 100 mL/hr over 30 Minutes Intravenous On call to O.R. 08/27/17 1502 08/29/17 0559   08/27/17 0500  cefUROXime (ZINACEF) 1.5 g in dextrose 5 % 50 mL IVPB     1.5  g 100 mL/hr over 30 Minutes Intravenous Every 12 hours 08/26/17 2132 08/27/17 1753   08/26/17 1600  cefUROXime (ZINACEF) 1.5 g in dextrose 5 % 50 mL IVPB     1.5 g 100 mL/hr over 30 Minutes Intravenous To Surgery 08/26/17 1543 08/27/17 0502   08/22/17 1715  vancomycin (VANCOCIN) IVPB 1000 mg/200 mL premix     1,000 mg 200 mL/hr over 60 Minutes Intravenous  Once 08/22/17 1710 08/22/17 1935   08/22/17 1715  piperacillin-tazobactam (ZOSYN) IVPB 3.375 g     3.375 g 100 mL/hr over 30 Minutes Intravenous  Once 08/22/17 1710 08/22/17 1838      Subjective:   Saba Sevin seen and examined today.  Continues to be a face. Denies chest pain, shortness of breath, abdominal pain, nausea or vomiting. Does complain of headache.  Objective:   Vitals:   08/28/17 0500 08/28/17 0600 08/28/17 0700 08/28/17 0836  BP: 139/80 129/63 (!) 143/74 125/80  Pulse: 61 (!) 52 62 (!) 52  Resp: (!) 22 (!) Temp:    98.3 F (36.8 C)  TempSrc:    Oral  SpO2: 91% (!) 85% 90% 91%  Weight:      Height:        Intake/Output Summary (Last 24 hours) at 08/28/17 1347 Last data filed at 08/28/17 0807  Gross per 24 hour  Intake          1893.42 ml  Output             2300 ml  Net          -406.58 ml   Filed Weights   08/23/17 0210 08/23/17 0500 08/23/17 2200  Weight: 78.5 kg (173 lb 1 oz) 78.5 kg (173 lb 1 oz) 80.9 kg (178 lb 5.6 oz)   Exam  General: Well developed, well nourished, NAD  HEENT: NCAT,mucous membranes moist.   Cardiovascular: S1 S2 auscultated, bradycardic, no murmur  Respiratory: Clear to auscultation bilaterally with equal chest rise  Abdomen: Soft, nontender, nondistended, + bowel sounds  Extremities: warm dry without cyanosis clubbing or edema. LE warm to touch-dressing in place  Neuro: AAOx3, nonfocal  Psych: appropriate  Data Reviewed: I have personally reviewed following labs and imaging studies  CBC:  Recent Labs Lab 08/22/17 1612 08/22/17 1624  08/23/17 0425 08/24/17 0102 08/27/17 0411 08/28/17 0207  WBC 15.4*  --  14.6* 10.9* 13.3*  13.4* 12.1*  NEUTROABS 13.1*  --   --   --   --   --   HGB 17.2* 17.7* 15.2 13.5 13.1  12.9* 11.8*  HCT 50.6 52.0 46.4 41.4 38.4*  38.2* 35.4*  MCV 92.8  --  96.3 93.5 89.5  89.9 90.3  PLT 185  --  133* 134* 154  148* 145*   Basic Metabolic Panel:  Recent Labs Lab 08/24/17 0102 08/24/17 0647 08/25/17 0709 08/27/17 0411 08/28/17 0207  NA 147* 147* 144 139  137 138  K 3.7 3.9 4.6 4.6  4.4 3.9  CL 116* 114* 115* 105  103 105  CO2 GLUCOSE 84 84 86 147*  146* 118*  BUN 25* 25* 22* CREATININE 1.17 1.20 1.07 1.18  1.19 1.12  CALCIUM 8.4* 8.5* 8.4* 8.3*  8.2* 8.2*   GFR: Estimated Creatinine Clearance: 72.3 mL/min (by C-G formula based on SCr of 1.12 mg/dL). Liver Function Tests:  Recent Labs Lab 08/22/17 1612 08/23/17 1007  AST 47* 33  ALT 30 26  ALKPHOS 56 44  BILITOT 1.5* 1.3*  PROT 8.2* 6.4*  ALBUMIN 4.1 3.1*   No results for input(s): LIPASE, AMYLASE in the last 168 hours.  Recent Labs Lab 08/22/17 1643  AMMONIA 23   Coagulation Profile:  Recent Labs Lab 08/22/17 1612  INR 1.09   Cardiac Enzymes:  Recent Labs Lab 08/22/17 1650 08/22/17 2155 08/23/17 0425 08/23/17 1007 08/27/17 0411  CKTOTAL 709*  --   --  380 175  TROPONINI  --  <0.03 <0.03 <0.03  --    BNP (last 3 results) No results for input(s): PROBNP in the last 8760 hours. HbA1C: No results for input(s): HGBA1C in the last 72 hours. CBG:  Recent Labs Lab 08/23/17 2249 08/24/17 0353 08/26/17 2105 08/27/17 0839 08/28/17 1144  GLUCAP 86 79 92 126* 97   Lipid Profile: No results for input(s): CHOL, HDL, LDLCALC, TRIG, CHOLHDL, LDLDIRECT in the last 72 hours. Thyroid Function Tests: No results for input(s): TSH, T4TOTAL, FREET4, T3FREE, THYROIDAB in the last 72 hours. Anemia Panel: No results for input(s): VITAMINB12, FOLATE, FERRITIN, TIBC,  IRON, RETICCTPCT in the last 72 hours. Urine analysis:    Component Value Date/Time   COLORURINE YELLOW 08/25/2017 1026   APPEARANCEUR HAZY (A) 08/25/2017 1026   LABSPEC 1.016 08/25/2017 1026   PHURINE 5.0 08/25/2017 1026   GLUCOSEU NEGATIVE 08/25/2017 1026   HGBUR LARGE (A) 08/25/2017 1026   BILIRUBINUR NEGATIVE 08/25/2017 1026   KETONESUR NEGATIVE 08/25/2017 1026   PROTEINUR 30 (A) 08/25/2017 1026  NITRITE NEGATIVE 08/25/2017 1026   LEUKOCYTESUR MODERATE (A) 08/25/2017 1026   Sepsis Labs: @LABRCNTIP (procalcitonin:4,lacticidven:4)  ) Recent Results (from the past 240 hour(s))  Blood Culture (routine x 2)     Status: None   Collection Time: 08/22/17  4:40 PM  Result Value Ref Range Status   Specimen Description RIGHT ANTECUBITAL  Final   Special Requests   Final    BOTTLES DRAWN AEROBIC AND ANAEROBIC Blood Culture adequate volume   Culture NO GROWTH 5 DAYS  Final   Report Status 08/27/2017 FINAL  Final  Blood Culture (routine x 2)     Status: None   Collection Time: 08/22/17  4:44 PM  Result Value Ref Range Status   Specimen Description BLOOD LEFT HAND  Final   Special Requests   Final    BOTTLES DRAWN AEROBIC AND ANAEROBIC Blood Culture adequate volume   Culture NO GROWTH 5 DAYS  Final   Report Status 08/27/2017 FINAL  Final  MRSA PCR Screening     Status: None   Collection Time: 08/23/17  1:59 AM  Result Value Ref Range Status   MRSA by PCR NEGATIVE NEGATIVE Final    Comment:        The GeneXpert MRSA Assay (FDA approved for NASAL specimens only), is one component of a comprehensive MRSA colonization surveillance program. It is not intended to diagnose MRSA infection nor to guide or monitor treatment for MRSA infections.   Urine Culture     Status: None   Collection Time: 08/25/17 10:26 AM  Result Value Ref Range Status   Specimen Description URINE, CLEAN CATCH  Final   Special Requests NONE  Final   Culture NO GROWTH  Final   Report Status 08/26/2017  FINAL  Final      Radiology Studies: Ct Angio Ao+bifem W & Or Wo Contrast  Result Date: 08/26/2017 CLINICAL DATA:  Assess for obstructing thrombus involving the bilateral lateral extremity vessels EXAM: CT ANGIOGRAPHY OF ABDOMINAL AORTA WITH ILIOFEMORAL RUNOFF TECHNIQUE: Multidetector CT imaging of the abdomen, pelvis and lower extremities was performed using the standard protocol during bolus administration of intravenous contrast. Multiplanar CT image reconstructions and MIPs were obtained to evaluate the vascular anatomy. CONTRAST:  100 mL Isovue 370  certainty intravenous COMPARISON:  None. FINDINGS: VASCULAR Aorta: No aneurysmal dilatation. Mild eccentric thrombus in the distal aorta above the bifurcation. Scattered atherosclerotic calcification. Celiac: Linear thrombus in the celiac trunk. This extends into the proximal splenic artery. Minimal thrombus at the origin of the common hepatic artery. There is distal vessel patency. SMA: Patent without evidence of aneurysm, dissection, vasculitis or significant stenosis. Renals: Both renal arteries are patent without evidence of aneurysm, dissection, vasculitis, fibromuscular dysplasia or significant stenosis. IMA: Patent without evidence of aneurysm, dissection, vasculitis or significant stenosis. RIGHT Lower Extremity Inflow: Near occlusive thrombus in the right common iliac. Occlusive thrombus in the internal iliac division with enhancement of distal right pelvic vessels. Occlusive thrombus within the external iliac vessel. Outflow: Occlusive thrombus within the right common femoral, superficial femoral, and popliteal vessels. Occlusive thrombus in the proximal profunda artery with enhancement of vessels in the thigh. Runoff: Near occlusive thrombus in the tibial peroneal trunk. Single vessel runoff via the posterior tibial artery. Severe disease and distal occlusion of the anterior tibial artery. Diminutive peroneal artery which is visualized to the mid  lower leg. LEFT Lower Extremity Inflow: Left common femoral artery is patent. Near occlusive thrombus within the proximal external iliac vessel with occlusive thrombus in the  distal external iliac vessel. Internal iliac artery is patent. Outflow: Occlusive thrombus in the common femoral and proximal to mid superficial femoral artery with some reconstitution of flow in the mid to distal superficial femoral artery which then becomes occluded distally. 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 Profunda artery is patent. Runoff: Occlusive thrombus in the tibial peroneal trunk. Single vessel runoff via the posterior tibial artery. Diffusely disease anterior tibial artery and peroneal artery. Peroneal and anterior tibial artery is visualized to the level of the mid lower leg Veins: No obvious venous abnormality within the limitations of this arterial phase study. Review of the MIP images confirms the above findings. NON-VASCULAR Lower chest: Lung bases demonstrate no acute consolidation or pleural effusion. Patchy dependent atelectasis. Cardiomegaly. Hepatobiliary: Calcified gallstone.  No focal hepatic abnormality Pancreas: Unremarkable. No pancreatic ductal dilatation or surrounding inflammatory changes. Spleen: Wedge-shaped hypodensity within the posterior spleen suspect for splenic infarct. Adrenals/Urinary Tract: Adrenal glands are within normal limits. Bilateral renal cortical scarring. No hydronephrosis. The bladder demonstrates mild irregular wall thickening Stomach/Bowel: Stomach is within normal limits. No evidence of bowel wall thickening, distention, or inflammatory changes. Lymphatic: No significantly enlarged abdominal or pelvic lymph nodes. Reproductive: Enlarged prostate with mass effect on the bladder posteriorly Other: Negative for free air or free fluid Musculoskeletal: No acute or suspicious bone lesions. Degenerative changes of the spine. IMPRESSION:  VASCULAR 1. Near occlusive thrombus within the right common iliac vessel with occlusive thrombus involving the right external iliac, common femoral, superficial femoral, and right popliteal vessels. Occlusive thrombus within the proximal profunda artery with distal flow present. Reconstituted flow at the trifurcation with single vessel runoff via the posterior tibial artery on the right. 2. Near occlusive to occlusive thrombus within the left external iliac artery with occlusive thrombus visualized in the left common femoral and superficial femoral artery. Reconstitution of some flow in the mid to distal SFA with thrombosis of the popliteal artery. 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 Single vessel runoff via the left posterior tibial artery. 3. Nonocclusive thrombus in the celiac trunk which extends to the left splenic artery. Wedge-shaped hypodensity in the spleen is suspicious for an infarct. Small amount of thrombus at the origin of the common hepatic artery. NON-VASCULAR 1. Wedge-shaped hypodensity in the spleen suspicious for infarct 2. Gallstone 3. Enlarged prostate with mass effect on the bladder Review of patient's epic chart at the time of dictation indicates that the images were reviewed by Dr. Darrick Penna with pertinent findings made and the patient was in the OR at the time of dictation. Electronically Signed   By: Jasmine Pang M.D.   On: 08/26/2017 17:17     Scheduled Meds: . amiodarone  150 mg Intravenous Once  . [MAR Hold] aspirin  325 mg Oral Daily  . [MAR Hold] atorvastatin  20 mg Oral q1800  . [MAR Hold] chlorhexidine  15 mL Mouth Rinse BID  . [MAR Hold] docusate sodium  100 mg Oral Daily  . [MAR Hold] mouth rinse  15 mL Mouth Rinse q12n4p  . [MAR Hold] pantoprazole  40 mg Oral Daily   Continuous Infusions: . [MAR Hold] cefUROXime (ZINACEF)  IV Stopped (08/28/17 0600)  . dextrose 5 % and 0.45% NaCl Stopped (08/27/17 1601)  . heparin 1,300 Units/hr (08/28/17 0807)  . lactated ringers 10  mL/hr at 08/28/17 1150  . [MAR Hold] magnesium sulfate 1 - 4 g bolus IVPB       LOS: 6 days   Time  Spent in minutes   45 minutes  Leslieann Whisman D.O. on 08/28/2017 at 1:47 PM  Between 7am to 7pm - Pager - [phone]  After 7pm go to [email or web address] - password [email or web address]  And look for the night coverage person covering for me after hours  [facility] Office  [phone]

## 2017-08-28 NOTE — Anesthesia Preprocedure Evaluation (Addendum)
Anesthesia Evaluation  Patient identified by MRN, date of birth, ID band  Reviewed: Allergy & Precautions, Patient's Chart, lab work & pertinent test results, Unable to perform ROS - Chart review only  Airway Mallampati: II  TM Distance: >3 FB Neck ROM: Full    Dental  (+) Dental Advisory Given   Pulmonary neg pulmonary ROS,    breath sounds clear to auscultation       Cardiovascular negative cardio ROS  + dysrhythmias Atrial Fibrillation  Rhythm:Irregular Rate:Tachycardia     Neuro/Psych CVA, Residual Symptoms    GI/Hepatic negative GI ROS, Neg liver ROS,   Endo/Other  negative endocrine ROS  Renal/GU Renal disease     Musculoskeletal negative musculoskeletal ROS (+)   Abdominal Normal abdominal exam  (+)   Peds  Hematology negative hematology ROS (+)   Anesthesia Other Findings Acute renal failure with rhabdomyolysis, bradycardia as well as  Large acute L-MCA infarct with 3 mm left to right midline shift.  MRI/MRA brain done leveling large acute L-MCA territory infarct along insula, opercula and basal ganglia with petechial hemorrhage and cytotoxic edema and distal left M1 occlusion with poor flow right PCA    Reproductive/Obstetrics                          Lab Results  Component Value Date   WBC 12.1 (H) 08/28/2017   HGB 11.8 (L) 08/28/2017   HCT 35.4 (L) 08/28/2017   MCV 90.3 08/28/2017   PLT 145 (L) 08/28/2017   Lab Results  Component Value Date   CREATININE 1.12 08/28/2017   BUN 18 08/28/2017   NA 138 08/28/2017   K 3.9 08/28/2017   CL 105 08/28/2017   CO2 26 08/28/2017     Echo: - Left ventricle: The cavity size was normal. Wall thickness was   normal. Systolic function was normal. The estimated ejection   fraction was 55%. Wall motion was normal; there were no regional   wall motion abnormalities. Left ventricular diastolic function   parameters were normal. - Aortic  valve: Trileaflet; mildly thickened leaflets. There was   trivial regurgitation. - Aorta: Mild aortic root dilatation. Aortic root dimension: 39 mm   (ED). - Mitral valve: There was mild regurgitation. - Left atrium: The atrium was mildly dilated. - Atrial septum: No defect or patent foramen ovale was identified. - Pulmonic valve: There was mild regurgitation.  Anesthesia Physical Anesthesia Plan  ASA: III  Anesthesia Plan: MAC   Post-op Pain Management:    Induction: Intravenous  PONV Risk Score and Plan: 2 and Ondansetron, Dexamethasone, Propofol infusion and Treatment may vary due to age or medical condition  Airway Management Planned: Mask  Additional Equipment:   Intra-op Plan: Utilization Of Total Body Hypothermia per surgeon request  Post-operative Plan:   Informed Consent: I have reviewed the patients History and Physical, chart, labs and discussed the procedure including the risks, benefits and alternatives for the proposed anesthesia with the patient or authorized representative who has indicated his/her understanding and acceptance.     Plan Discussed with:   Anesthesia Plan Comments: (Discussed Afib w/ RVR with surgeon. TEE cancelled by Cardiology. No Amiodarone has been started. Discussed patient with Cardiology PA. She recommended conversation with Dr. Meda Coffee. Multiple attempts made. Will proceed with procedure with local anesthesia only. Pt is currently anticoagulated with heparin. )     Anesthesia Quick Evaluation

## 2017-08-28 NOTE — Progress Notes (Signed)
Placed on tele to monitor  

## 2017-08-28 NOTE — Interval H&P Note (Signed)
History and Physical Interval Note:  08/28/2017 12:58 PM  [name] [name]  has presented today for surgery, with the diagnosis of FASCIOTOMY  The various methods of treatment have been discussed with the patient and family. After consideration of risks, benefits and other options for treatment, the patient has consented to  Procedure(s): FASCIOTOMY CLOSURE/BILATERAL (Bilateral) as a surgical intervention .  The patient's history has been reviewed, patient examined, no change in status, stable for surgery.  I have reviewed the patient's chart and labs.  Questions were answered to the patient's satisfaction.     Fabienne BrunsFields, Charles

## 2017-08-28 NOTE — Op Note (Signed)
Procedure: Closure of bilateral leg fasciotomies  Preoperative diagnosis: Open wound left leg  Postoperative diagnosis: Same  Anesthesia Local with sedation  Assistant: Nurse  Operative details: After obtaining informed consent, the patient was taken to the operating room. The patient was placed in supine position on operating table. After some sedation, both legs were prepped and draped in sterile fashion. The lateral and medial fasciotomy wounds were inspected. The muscle was pink and viable. There was essentially no tension on the skin edges.  Local anesthesia combo of 1% plain and with epi lidocaine was infiltrated in the skin of all 4 incisions.   I then proceeded to irrigate both leg wounds thoroughly with a liter of normal saline solution. The skin edges were then reapproximated with staples in all 4 incisions. The patient tolerated procedure well and there were no complications. Instrument sponge and needle counts were correct at the end of the case. The patient was taken to the recovery room in stable condition.  Fabienne Brunsharles Emre Stock, MD [facility] Office: [phone] Pager: [phone]

## 2017-08-28 NOTE — Consult Note (Addendum)
Physical Medicine and Rehabilitation Consult   Reason for Consult: Deficits in mobility, ADLs and aphasia due to stroke Referring Physician: Dr. Catha GosselinMikhail.    HPI: [name] [name] is a 66 y.o. relatively healthy male found on the floor by brother with right sided weakness, inability to speak and covered in urine.  Lives with mother who reported symptoms ongoing for 2 days. History taken from chart review. UDS negative. He was found to have acute renal failure with rhabdomyolysis, bradycardia as well as large acute L-MCA infarct with 3 mm left to right midline shift.  MRI/MRA brain reviewed, showing left MCA. Per report, large acute L-MCA territory infarct along insula, opercula and basal ganglia with petechial hemorrhage and cytotoxic edema and distal left M1 occlusion with poor flow right PCA presumably from underlying advanced stenosis. He was started on IVF for hydration and [facility]/treatment.   Patient with mixed expressive > receptive aphasia, nonverbal and on dysphagia 1, nectar liquids due to dysphagia.  Carotid dopplers revealed no significant stenosis but ulcerated plaque sat carotid bifurcation. 2 D echo with EF 55% with no wall abnormality and mildly thickened aortic valve. Dr Pearlean BrownieSethi recommended continuing ASA for stroke prevention and full work up for source of embolic stroke.  Dr. Antoine PocheHochrein consulted for input and TEE ordered for work up with recommendations to keep pacer pads at beside due to persistent bradycardia with HR in 40's. No indications for pacing.  He was found to have decreased pulses BLE with ischemia and was taken to OR on 9/08 for bilateral ilieo-femoral-popliteal embolectomy with four compartment fasciotomy by Dr. Darrick PennaFields with closure. He continues to be on on IV heparin. Therapy ongoing and patient with significant impairments in mobility, communication as well as ADLs. [name] recommended for follow up therpy.    Review of Systems  Unable to  perform ROS: Language    History reviewed. No pertinent past medical history, unable to obtain from patient.   Past Surgical History:  Procedure Laterality Date  . FASCIOTOMY CLOSURE Bilateral 08/28/2017   Procedure: FASCIOTOMY CLOSURE/BILATERAL  lower legs;  Surgeon: Sherren KernsFields, [name], MD;  [facility];  Service: Vascular;  Laterality: Bilateral;  . FEMORAL-POPLITEAL BYPASS GRAFT Bilateral 08/26/2017   Procedure: Bilateral FEMORAL EMBOLECTOMY with  BILATERAL Four Compartment  FASCIOTOMIES.;  Surgeon: Sherren KernsFields, [name], MD;  [facility];  Service: Vascular;  Laterality: Bilateral;    History reviewed. No pertinent family history, unable to obtain from patient.   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 Independent PTA. Retired [employer]---worked for 25 years and retired 5- 10 years ago. Per reports he has never smoked. He has never used smokeless tobacco. His alcohol and drug histories are not on file.   Allergies: No Known Allergies   No prescriptions prior to admission.   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  Functional Status:  Mobility: Bed Mobility Overal bed mobility: Needs Assistance Bed Mobility: Supine to Sit Rolling: Min guard Sidelying  to sit: HOB elevated, Mod assist Supine to sit: Mod assist General bed mobility comments: Cues for sequencing and hand placement. Assist to scoot  hips with use of bed pad and for balance initially in sitting Transfers Overall transfer level: Needs assistance Equipment used: 2 person hand held assist Transfers: Sit to/from Stand, Stand Pivot Transfers Sit to Stand: Max assist, +2 physical assistance Stand pivot transfers: Max assist, +2 physical assistance General transfer comment: Assist to boost up to full upright standing; cues for posture and technqiue. Max assist +2 for pivot to chair going to R side. Difficulty unweight either foot for stepping      ADL: ADL Overall ADL's : Needs assistance/impaired Eating/Feeding: Sitting, Minimal assistance Eating/Feeding Details (indicate cue type and reason): drinking only observed Grooming: Maximal assistance, Bed level, Wash/dry hands Grooming Details (indicate cue type and reason): to wash R hand Upper Body Bathing: Maximal assistance, Sitting Lower Body Bathing: Total assistance, +2 for physical assistance, Sit to/from stand Upper Body Dressing : Maximal assistance, Sitting Upper Body Dressing Details (indicate cue type and reason): began instruction in hemitechniques Lower Body Dressing: Total assistance, Bed level Lower Body Dressing Details (indicate cue type and reason): to don socks Toilet Transfer: Maximal assistance, +2 for physical assistance, Stand-pivot Toilet Transfer Details (indicate cue type and reason): Simulated by stand pviot EOB to chair Toileting- Clothing Manipulation and Hygiene: +2 for physical assistance, Total assistance, Sit to/from stand Functional mobility during ADLs: Maximal assistance, +2 for physical assistance (for stand pivot only) General ADL Comments: Educated pt on elevation of RUE for edema control and protection; positioned properly at end of session.  Cognition: Cognition Overall Cognitive Status: Difficult to assess Arousal/Alertness: Awake/alert Orientation Level: Other (comment) (UTA- aphasia) Awareness: Impaired Awareness Impairment:  Anticipatory impairment, Emergent impairment Cognition Arousal/Alertness: Awake/alert Behavior During Therapy: Flat affect Overall Cognitive Status: Difficult to assess Area of Impairment: Following commands, Problem solving Orientation Level: Disoriented to, Place Current Attention Level: Sustained Following Commands: Follows one step commands consistently, Follows one step commands with increased time Problem Solving: Requires verbal cues, Requires tactile cues (Requires visual cues) General Comments: inconsistent responses to questions but able to follow commands well with visual and verbal cues Difficult to assess due to: Impaired communication (aphasia)   Blood pressure 93/72, pulse (!) 32, temperature 98.3 F (36.8 C), temperature source Oral, resp. rate 10, height  (1.854 m), weight 80.9 kg (178 lb 5.6 oz), SpO2 98 %. Physical Exam  Nursing note and vitals reviewed. Constitutional: He appears well-developed and well-nourished. No distress.  Anxious looking male.   HENT:  Head: Normocephalic and atraumatic.  Mouth/Throat: Oropharynx is clear and moist.  Eyes: Pupils are equal, round, and reactive to light. Conjunctivae and EOM are normal.  Neck: Normal range of motion. Neck supple.  Cardiovascular: An irregularly irregular rhythm present.  Respiratory: Effort normal and breath sounds normal. No stridor. No respiratory distress. He has no wheezes.  GI: Soft. Bowel sounds are normal. He exhibits no distension. There is no tenderness.  Musculoskeletal: He exhibits no edema or tenderness.  Neurological: He is alert.  Nonverbal Right facial weakness.  Aphasic, apraxic.  Not following commands Spontaneously moving LUE/LLE, no movement noted on right DTRs symmetric  Skin: He is not diaphoretic.  BLE with dressing in place  Psychiatric:  Unable to assess due to mentation    Results for orders placed or performed during the hospital encounter of 08/22/17 (from the past 24  hour(s))  Glucose, capillary  Status: None   Collection Time: 08/28/17 11:44 AM  Result Value Ref Range   Glucose-Capillary 97 65 - 99 mg/dL  Heparin level (unfractionated)     Status: Abnormal   Collection Time: 08/28/17  7:55 PM  Result Value Ref Range   Heparin Unfractionated 0.23 (L) 0.30 - 0.70 IU/mL  Heparin level (unfractionated)     Status: None   Collection Time: 08/29/17  3:00 AM  Result Value Ref Range   Heparin Unfractionated 0.34 0.30 - 0.70 IU/mL  CBC     Status: Abnormal   Collection Time: 08/29/17  3:00 AM  Result Value Ref Range   WBC 11.2 (H) 4.0 - 10.5 K/uL   RBC 3.67 (L) 4.22 - 5.81 MIL/uL   Hemoglobin 11.2 (L) 13.0 - 17.0 g/dL   HCT 16.1 (L) 09.6 - 04.5 %   MCV 90.7 78.0 - 100.0 fL   MCH 30.5 26.0 - 34.0 pg   MCHC 33.6 30.0 - 36.0 g/dL   RDW 40.9 81.1 - 91.4 %   Platelets 143 (L) 150 - 400 K/uL  Basic metabolic panel     Status: Abnormal   Collection Time: 08/29/17  3:00 AM  Result Value Ref Range   Sodium 134 (L) 135 - 145 mmol/L   Potassium 4.1 3.5 - 5.1 mmol/L   Chloride 104 101 - 111 mmol/L   CO2 25 22 - 32 mmol/L   Glucose, Bld 102 (H) 65 - 99 mg/dL   BUN 17 6 - 20 mg/dL   Creatinine, Ser 7.82 0.61 - 1.24 mg/dL   Calcium 8.1 (L) 8.9 - 10.3 mg/dL   GFR calc non Af Amer >60 >60 mL/min   GFR calc Af Amer >60 >60 mL/min   Anion gap 5 5 - 15   No results found.  Assessment/Plan: Diagnosis: Left MCA Labs and images independently reviewed.  Records reviewed and summated above. Stroke: Continue secondary stroke prophylaxis and Risk Factor Modification listed below:   Antiplatelet therapy:   Blood Pressure Management:  Continue current medication with prn's with permisive HTN per primary team Statin Agent:   Right sided hemiparesis: fit for orthosis to prevent contractures (resting hand splint for day, wrist cock up splint at night, PRAFO, etc) Motor recovery: Fluoxetine  1. Does the need for close, 24 hr/day medical supervision in concert with  the patient's rehab needs make it unreasonable for this patient to be served in a less intensive setting? Yes 2. Co-Morbidities requiring supervision/potential complications: dysphagia (advance diet as tolerated), combined CHF (Monitor in accordance with increased physical activity and avoid UE resistance excercises), tachypnea (monitor RR and O2 Sats with increased physical exertion), Afib with tachy/brady (cont to monitor with increased activity, transition from heparin ggt when appropriate), leukocytosis (cont to monitor for signs and symptoms of infection, further workup if indicated), ABLA (transfuse if necessary to ensure appropriate perfusion for increased activity tolerance), hyponatremia (cont to monitor, treat if necessary) 3. Due to bladder management, bowel management, safety, skin/wound care, disease management, medication administration and patient education, does the patient require 24 hr/day rehab nursing? Yes 4. Does the patient require coordinated care of a physician, rehab nurse, PT (1-2 hrs/day, 5 days/week), OT (1-2 hrs/day, 5 days/week) and SLP (1-2 hrs/day, 5 days/week) to address physical and functional deficits in the context of the above medical diagnosis(es)? Yes Addressing deficits in the following areas: balance, endurance, locomotion, strength, transferring, bowel/bladder control, bathing, dressing, feeding, grooming, toileting, cognition, speech, language, swallowing and psychosocial support 5. Can the patient  actively participate in an intensive therapy program of at least 3 hrs of therapy per day at least 5 days per week? Potentially 6. The potential for patient to make measurable gains while on inpatient rehab is excellent 7. Anticipated functional outcomes upon discharge from inpatient rehab are min assist and mod assist  with PT, min assist and mod assist with OT, min assist with SLP. 8. Estimated rehab length of stay to reach the above functional goals is: 24-28  days. 9. Anticipated D/C setting: Other 10. Anticipated post D/C treatments: SNF 11. Overall Rehab/Functional Prognosis: good  RECOMMENDATIONS: This patient's condition is appropriate for continued rehabilitative care in the following setting: [name] when medically stable and able to tolerate 3 hours therapy/day. Patient has agreed to participate in recommended program. N/A Note that insurance prior authorization may be required for reimbursement for recommended care.  Comment: Rehab Admissions Coordinator to follow up.  Emerlyn Mehlhoff Karis Juba, MD 08/29/2017  Delle Reining, PA 08/28/2017

## 2017-08-28 NOTE — Progress Notes (Signed)
Called [phone] extension has order for TEE.

## 2017-08-28 NOTE — Progress Notes (Signed)
PT Cancellation Note  Patient Details Name: [name] CreekRichard P [name] MRN: [id number] DOB: 12/11/1951   Cancelled Treatment:    Reason Eval/Treat Not Completed: Patient at procedure or test/unavailable pt in OR for closure of fasciotomies. Will follow up post op.   Blake DivineShauna A Alice Vitelli 08/28/2017, 12:40 PM Mylo RedShauna [name], PT, DPT [phone]

## 2017-08-28 NOTE — H&P (View-Only) (Signed)
[facility]  Subjective  - awake sorta follows commands   Objective 94/61 (!) 47 (!) 97.5 F (36.4 C) (Oral) 13 98%  Intake/Output Summary (Last 24 hours) at 08/27/17 0347 Last data filed at 08/27/17 0134  Gross per 24 hour  Intake          3888.38 ml  Output             3600 ml  Net           288.38 ml   Right leg PT brisk doppler nearly biphasic no DP Left leg PT peroneal doppler no DP No groin hematoma No fasciotomy bleeding  Assessment/Planning:  Viable extremities with adequate flow Probably back to OR tomorrow to close fasciotomies Would continue heparin.  Legs had appearance of cardioembolic event.  Fabienne BrunsFields, Charles 08/27/2017 3:47 AM --  Laboratory Lab Results: No results for input(s): WBC, HGB, HCT, PLT in the last 72 hours. BMET  Recent Labs  08/24/17 0647 08/25/17 0709  NA 147* 144  K 3.9 4.6  CL 114* 115*  CO2 24 22  GLUCOSE 84 86  BUN 25* 22*  CREATININE 1.20 1.07  CALCIUM 8.5* 8.4*    COAG Lab Results  Component Value Date   INR 1.09 08/22/2017   No results found for: PTT

## 2017-08-28 NOTE — Progress Notes (Addendum)
Progress Note  Patient Name: [name] [name] Date of Encounter: 08/28/2017  Primary Cardiologist: new patient  Subjective   The patient is aphasic but denies any chest pain or SOB.  Inpatient Medications    Scheduled Meds: . [MAR Hold] aspirin  325 mg Oral Daily  . [MAR Hold] atorvastatin  20 mg Oral q1800  . [MAR Hold] chlorhexidine  15 mL Mouth Rinse BID  . [MAR Hold] docusate sodium  100 mg Oral Daily  . [MAR Hold] mouth rinse  15 mL Mouth Rinse q12n4p  . [MAR Hold] pantoprazole  40 mg Oral Daily   Continuous Infusions: . [MAR Hold] cefUROXime (ZINACEF)  IV Stopped (08/28/17 0600)  . dextrose 5 % and 0.45% NaCl Stopped (08/27/17 1601)  . heparin 1,300 Units/hr (08/28/17 0807)  . lactated ringers    . [MAR Hold] magnesium sulfate 1 - 4 g bolus IVPB     PRN Meds: [MAR Hold] acetaminophen **OR** [MAR Hold] acetaminophen, [MAR Hold] alum & mag hydroxide-simeth, [MAR Hold] guaiFENesin-dextromethorphan, [MAR Hold] hydrALAZINE, [MAR Hold] labetalol, [MAR Hold] magnesium sulfate 1 - 4 g bolus IVPB, [MAR Hold] metoprolol tartrate, [MAR Hold] ondansetron, [MAR Hold] phenol, [MAR Hold] potassium chloride, [MAR Hold] RESOURCE THICKENUP CLEAR, [MAR Hold] senna-docusate   Vital Signs    Vitals:   08/28/17 0500 08/28/17 0600 08/28/17 0700 08/28/17 0836  BP: 139/80 129/63 (!) 143/74 125/80  Pulse: 61 (!) 52 62 (!) 52  Resp: (!) 22 (!) Temp:    98.3 F (36.8 C)  TempSrc:    Oral  SpO2: 91% (!) 85% 90% 91%  Weight:      Height:        Intake/Output Summary (Last 24 hours) at 08/28/17 1145 Last data filed at 08/28/17 0807  Gross per 24 hour  Intake          1893.42 ml  Output             2300 ml  Net          -406.58 ml   Filed Weights   08/23/17 0210 08/23/17 0500 08/23/17 2200  Weight: 173 lb 1 oz (78.5 kg) 173 lb 1 oz (78.5 kg) 178 lb 5.6 oz (80.9 kg)   Telemetry    A-fib with RVR - Personally Reviewed  Physical Exam  GEN: No acute distress.   Neck:  No JVD Cardiac: RRR, no murmurs, rubs, or gallops.  Respiratory: Clear to auscultation bilaterally. GI: Soft, nontender, non-distended  MS: No edema; No deformity. Neuro:  Nonfocal  Psych: Normal affect   Labs    Chemistry Recent Labs Lab 08/22/17 1612  08/23/17 1007  08/25/17 0709 08/27/17 0411 08/28/17 0207  NA 146*  < > 147*  < > 144 139  137 138  K 4.3  < > 4.2  < > 4.6 4.6  4.4 3.9  CL 113*  < > 117*  < > 115* 105  103 105  CO2 21*  < > 22  < > GLUCOSE 117*  < > 99  < > 86 147*  146* 118*  BUN 45*  < > 34*  < > 22* CREATININE 1.64*  < > 1.24  < > 1.07 1.18  1.19 1.12  CALCIUM 9.3  < > 8.5*  < > 8.4* 8.3*  8.2* 8.2*  PROT 8.2*  --  6.4*  --   --   --   --  ALBUMIN 4.1  --  3.1*  --   --   --   --   AST 47*  --  33  --   --   --   --   ALT 30  --  26  --   --   --   --   ALKPHOS 56  --  44  --   --   --   --   BILITOT 1.5*  --  1.3*  --   --   --   --   GFRNONAA 42*  < > 58*  < > >60 >60  >60 >60  GFRAA 48*  < > >60  < > >60 >60  >60 >60  ANIONGAP 12  < > 8  < > < > = values in this interval not displayed.   Hematology Recent Labs Lab 08/24/17 0102 08/27/17 0411 08/28/17 0207  WBC 10.9* 13.3*  13.4* 12.1*  RBC 4.43 4.29  4.25 3.92*  HGB 13.5 13.1  12.9* 11.8*  HCT 41.4 38.4*  38.2* 35.4*  MCV 93.5 89.5  89.9 90.3  MCH 30.5 30.5  30.4 30.1  MCHC 32.6 34.1  33.8 33.3  RDW 13.4 12.9  12.7 12.9  PLT 134* 154  148* 145*   Cardiac Enzymes Recent Labs Lab 08/22/17 2155 08/23/17 0425 08/23/17 1007  TROPONINI <0.03 <0.03 <0.03    Recent Labs Lab 08/22/17 1618  TROPIPOC 0.02    BNPNo results for input(s): BNP, PROBNP in the last 168 hours.   DDimer No results for input(s): DDIMER in the last 168 hours.   Radiology    Ct Angio Ao+bifem W & Or Wo Contrast  Result Date: 08/26/2017 CLINICAL DATA:  Assess for obstructing thrombus involving the bilateral lateral extremity vessels EXAM: CT ANGIOGRAPHY  OF ABDOMINAL AORTA WITH ILIOFEMORAL RUNOFF TECHNIQUE: Multidetector CT imaging of the abdomen, pelvis and lower extremities was performed using the standard protocol during bolus administration of intravenous contrast. Multiplanar CT image reconstructions and MIPs were obtained to evaluate the vascular anatomy. CONTRAST:  100 mL Isovue 370  certainty intravenous COMPARISON:  None. FINDINGS: VASCULAR Aorta: No aneurysmal dilatation. Mild eccentric thrombus in the distal aorta above the bifurcation. Scattered atherosclerotic calcification. Celiac: Linear thrombus in the celiac trunk. This extends into the proximal splenic artery. Minimal thrombus at the origin of the common hepatic artery. There is distal vessel patency. SMA: Patent without evidence of aneurysm, dissection, vasculitis or significant stenosis. Renals: Both renal arteries are patent without evidence of aneurysm, dissection, vasculitis, fibromuscular dysplasia or significant stenosis. IMA: Patent without evidence of aneurysm, dissection, vasculitis or significant stenosis. RIGHT Lower Extremity Inflow: Near occlusive thrombus in the right common iliac. Occlusive thrombus in the internal iliac division with enhancement of distal right pelvic vessels. Occlusive thrombus within the external iliac vessel. Outflow: Occlusive thrombus within the right common femoral, superficial femoral, and popliteal vessels. Occlusive thrombus in the proximal profunda artery with enhancement of vessels in the thigh. Runoff: Near occlusive thrombus in the tibial peroneal trunk. Single vessel runoff via the posterior tibial artery. Severe disease and distal occlusion of the anterior tibial artery. Diminutive peroneal artery which is visualized to the mid lower leg. LEFT Lower Extremity Inflow: Left common femoral artery is patent. Near occlusive thrombus within the proximal external iliac vessel with occlusive thrombus in the distal external iliac vessel. Internal iliac artery  is patent. Outflow: Occlusive thrombus in the common femoral and proximal to mid superficial  femoral artery with some reconstitution of flow in the mid to distal superficial femoral artery which then becomes occluded distally. 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 Profunda artery is patent. Runoff: Occlusive thrombus in the tibial peroneal trunk. Single vessel runoff via the posterior tibial artery. Diffusely disease anterior tibial artery and peroneal artery. Peroneal and anterior tibial artery is visualized to the level of the mid lower leg Veins: No obvious venous abnormality within the limitations of this arterial phase study. Review of the MIP images confirms the above findings. NON-VASCULAR Lower chest: Lung bases demonstrate no acute consolidation or pleural effusion. Patchy dependent atelectasis. Cardiomegaly. Hepatobiliary: Calcified gallstone.  No focal hepatic abnormality Pancreas: Unremarkable. No pancreatic ductal dilatation or surrounding inflammatory changes. Spleen: Wedge-shaped hypodensity within the posterior spleen suspect for splenic infarct. Adrenals/Urinary Tract: Adrenal glands are within normal limits. Bilateral renal cortical scarring. No hydronephrosis. The bladder demonstrates mild irregular wall thickening Stomach/Bowel: Stomach is within normal limits. No evidence of bowel wall thickening, distention, or inflammatory changes. Lymphatic: No significantly enlarged abdominal or pelvic lymph nodes. Reproductive: Enlarged prostate with mass effect on the bladder posteriorly Other: Negative for free air or free fluid Musculoskeletal: No acute or suspicious bone lesions. Degenerative changes of the spine. IMPRESSION: VASCULAR 1. Near occlusive thrombus within the right common iliac vessel with occlusive thrombus involving the right external iliac, common femoral, superficial femoral, and right popliteal vessels. Occlusive thrombus within the  proximal profunda artery with distal flow present. Reconstituted flow at the trifurcation with single vessel runoff via the posterior tibial artery on the right. 2. Near occlusive to occlusive thrombus within the left external iliac artery with occlusive thrombus visualized in the left common femoral and superficial femoral artery. Reconstitution of some flow in the mid to distal SFA with thrombosis of the popliteal artery. 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 Single vessel runoff via the left posterior tibial artery. 3. Nonocclusive thrombus in the celiac trunk which extends to the left splenic artery. Wedge-shaped hypodensity in the spleen is suspicious for an infarct. Small amount of thrombus at the origin of the common hepatic artery. NON-VASCULAR 1. Wedge-shaped hypodensity in the spleen suspicious for infarct 2. Gallstone 3. Enlarged prostate with mass effect on the bladder Review of patient's epic chart at the time of dictation indicates that the images were reviewed by Dr. Darrick PennaFields with pertinent findings made and the patient was in the OR at the time of dictation. Electronically Signed   By: Jasmine PangKim  Fujinaga M.D.   On: 08/26/2017 17:17    Cardiac Studies   TTE: 09/02/17 - Left ventricle: The cavity size was normal. Wall thickness was   normal. Systolic function was normal. The estimated ejection   fraction was 55%. Wall motion was normal; there were no regional   wall motion abnormalities. Left ventricular diastolic function   parameters were normal. - Aortic valve: Trileaflet; mildly thickened leaflets. There was   trivial regurgitation. - Aorta: Mild aortic root dilatation. Aortic root dimension: 39 mm   (ED). - Mitral valve: There was mild regurgitation. - Left atrium: The atrium was mildly dilated. - Atrial septum: No defect or patent foramen ovale was identified. - Pulmonic valve: There was mild regurgitation.    Patient Profile     66 y.o. male   Assessment & Plan    1.   Asymptomatic sinus bradycardia 2.  Peripheral vascular disease with Recent bilateral embolectomies 3.  Recent large left brain stroke 4.  New afib with  RVR  The patient is awaiting TEE, DCCV on Wednesday, we will start iv amiodarone, no heparin for now, a-fib started 2 hours ago. I would appreciate neurology input about when it would be safe to start anticoagulation especially since he had periinfarct hemorrhage.  For questions or updates, please contact [facility] Please consult [email or web address] for contact info under Cardiology/STEMI. Daytime calls, contact the Day Call APP (6a-8a) or assigned team (Teams A-D) provider (7:30a - 5p). All other daytime calls (7:30-5p), contact the Card Master @ [phone].   Nighttime calls, contact the assigned APP (5p-8p) or MD (6:30p-8p). Overnight calls (8p-6a), contact the on call Fellow @ [phone].   Signed, Tobias Alexander, MD  08/28/2017, 11:45 AM

## 2017-08-28 NOTE — Progress Notes (Signed)
Called by TEE department this morning for consent signing of TEE scheduled for 1000.  Patient has no order in the system for TEE. Tried to call department back but no answer until after 0800 @ [id number] according to the message and operator.  Brother has to sign his consent and doctor has to call and talk to him about the procedure.

## 2017-08-28 NOTE — Progress Notes (Signed)
Patient in surgery.  Will follow up with formal rehab consult tomorrow.  Maryla MorrowAnkit Metzli Pollick, MD, [place] DomFAAPMR 08/28/17

## 2017-08-28 NOTE — Anesthesia Postprocedure Evaluation (Signed)
Anesthesia Post Note  Patient: [name] [name]  Procedure(s) Performed: Procedure(s) (LRB): FASCIOTOMY CLOSURE/BILATERAL  lower legs (Bilateral)     Patient location during evaluation: PACU Anesthesia Type: MAC Level of consciousness: awake and alert Pain management: pain level controlled Vital Signs Assessment: post-procedure vital signs reviewed and stable Respiratory status: spontaneous breathing, nonlabored ventilation, respiratory function stable and patient connected to nasal cannula oxygen Cardiovascular status: stable and blood pressure returned to baseline Anesthetic complications: no Comments: Pt remains in Afib with RVR, cardiology still following.     Last Vitals:  Vitals:   08/28/17 1510 08/28/17 1522  BP: (!) 97/57 97/73  Pulse: (!) 48 (!) 31  Resp: 14 19  Temp:    SpO2: 100% 98%    Last Pain:  Vitals:   08/28/17 1422  TempSrc:   PainSc: 0-No pain    LLE Motor Response: Purposeful movement (08/28/17 1520) LLE Sensation: Full sensation (08/28/17 1520) RLE Motor Response: Purposeful movement (08/28/17 1520)        Effie Berkshire

## 2017-08-28 NOTE — Progress Notes (Signed)
ANTICOAGULATION CONSULT NOTE - Follow Up Consult  Pharmacy Consult for Heparin Indication: Occludeded arteries LE B/L  No Known Allergies  Patient Measurements: Height: 6\' 1"  (185.4 cm) Weight: 178 lb 5.6 oz (80.9 kg) IBW/kg (Calculated) : 79.9 Heparin Dosing Weight: 80.9 kg  Vital Signs: Temp: 98.2 F (36.8 C) (09/10 1928) Temp Source: Oral (09/10 1928) BP: 95/82 (09/10 1900) Pulse Rate: 50 (09/10 1900)  Labs:  Recent Labs  08/27/17 0411  08/28/17 0207 08/28/17 0905 08/28/17 1955  HGB 13.1  12.9*  --  11.8*  --   --   HCT 38.4*  38.2*  --  35.4*  --   --   PLT 154  148*  --  145*  --   --   HEPARINUNFRC 0.53  < > 0.20* 0.31 0.23*  CREATININE 1.18  1.19  --  1.12  --   --   CKTOTAL 175  --   --   --   --   < > = values in this interval not displayed.  Estimated Creatinine Clearance: 72.3 mL/min (by C-G formula based on SCr of 1.12 mg/dL).   Assessment: [name] who was found down with AMS on 9/4 and brought into the [facility]. Work-up on admission revealed a new acute L-MCA CVA. Dopplers done 9/8 show occluded B/L LE arteries and pharmacy was consulted to start heparin for anticoagulation and to hold boluses in the setting of a recent CVA.   The patient was noted to be taken for emergent embolectomies/fasciotomies 9/8 with heparin resumed post-op. Per [facility]VS, the plan is to go back to the OR today to close the fasciotomies.   S/p OR with heparin level of 0.23  Goal of Therapy:  Heparin level 0.3-0.5 units/ml Monitor platelets by anticoagulation protocol: Yes   Plan:  Increase heparin to 1450 units / hr Follow up AM labs  Thank you Okey RegalLisa Yarielis Funaro, PharmD [phone] 08/28/2017 9:00 PM

## 2017-08-29 ENCOUNTER — Inpatient Hospital Stay (HOSPITAL_COMMUNITY): Payer: Medicare Other

## 2017-08-29 ENCOUNTER — Encounter (HOSPITAL_COMMUNITY): Payer: Self-pay | Admitting: Vascular Surgery

## 2017-08-29 DIAGNOSIS — I480 Paroxysmal atrial fibrillation: Secondary | ICD-10-CM

## 2017-08-29 DIAGNOSIS — G8191 Hemiplegia, unspecified affecting right dominant side: Secondary | ICD-10-CM

## 2017-08-29 DIAGNOSIS — E871 Hypo-osmolality and hyponatremia: Secondary | ICD-10-CM

## 2017-08-29 DIAGNOSIS — I639 Cerebral infarction, unspecified: Secondary | ICD-10-CM

## 2017-08-29 DIAGNOSIS — I5043 Acute on chronic combined systolic (congestive) and diastolic (congestive) heart failure: Secondary | ICD-10-CM

## 2017-08-29 DIAGNOSIS — I739 Peripheral vascular disease, unspecified: Secondary | ICD-10-CM

## 2017-08-29 DIAGNOSIS — R0682 Tachypnea, not elsewhere classified: Secondary | ICD-10-CM

## 2017-08-29 DIAGNOSIS — D62 Acute posthemorrhagic anemia: Secondary | ICD-10-CM

## 2017-08-29 DIAGNOSIS — I998 Other disorder of circulatory system: Secondary | ICD-10-CM

## 2017-08-29 DIAGNOSIS — D72829 Elevated white blood cell count, unspecified: Secondary | ICD-10-CM

## 2017-08-29 DIAGNOSIS — I69391 Dysphagia following cerebral infarction: Secondary | ICD-10-CM

## 2017-08-29 LAB — HEPARIN LEVEL (UNFRACTIONATED): Heparin Unfractionated: 0.34 IU/mL (ref 0.30–0.70)

## 2017-08-29 LAB — CBC
HEMATOCRIT: 33.3 % — AB (ref 39.0–52.0)
HEMOGLOBIN: 11.2 g/dL — AB (ref 13.0–17.0)
MCH: 30.5 pg (ref 26.0–34.0)
MCHC: 33.6 g/dL (ref 30.0–36.0)
MCV: 90.7 fL (ref 78.0–100.0)
Platelets: 143 10*3/uL — ABNORMAL LOW (ref 150–400)
RBC: 3.67 MIL/uL — ABNORMAL LOW (ref 4.22–5.81)
RDW: 13 % (ref 11.5–15.5)
WBC: 11.2 10*3/uL — ABNORMAL HIGH (ref 4.0–10.5)

## 2017-08-29 LAB — BASIC METABOLIC PANEL
Anion gap: 5 (ref 5–15)
BUN: 17 mg/dL (ref 6–20)
CHLORIDE: 104 mmol/L (ref 101–111)
CO2: 25 mmol/L (ref 22–32)
CREATININE: 1.12 mg/dL (ref 0.61–1.24)
Calcium: 8.1 mg/dL — ABNORMAL LOW (ref 8.9–10.3)
GFR calc Af Amer: >60 mL/min (ref >60)
GFR calc non Af Amer: >60 mL/min (ref >60)
Glucose, Bld: 102 mg/dL — ABNORMAL HIGH (ref 65–99)
Potassium: 4.1 mmol/L (ref 3.5–5.1)
Sodium: 134 mmol/L — ABNORMAL LOW (ref 135–145)

## 2017-08-29 MED ORDER — AMIODARONE HCL IN DEXTROSE 360-4.14 MG/200ML-% IV SOLN
60.0000 mg/h | INTRAVENOUS | Status: DC
  Filled 2017-08-29: qty 200

## 2017-08-29 MED ORDER — ACETAMINOPHEN 325 MG PO TABS
650.0000 mg | ORAL_TABLET | Freq: Four times a day (QID) | ORAL | Status: DC | PRN
  Administered 2017-08-30: 650 mg via ORAL
  Filled 2017-08-29: qty 2

## 2017-08-29 MED ORDER — AMIODARONE HCL IN DEXTROSE 360-4.14 MG/200ML-% IV SOLN: 30.0000 mg/h | INTRAVENOUS | Status: DC

## 2017-08-29 MED ORDER — AMIODARONE HCL IN DEXTROSE 360-4.14 MG/200ML-% IV SOLN
30.0000 mg/h | INTRAVENOUS | Status: DC
  Administered 2017-08-29 – 2017-08-30 (×2): 30 mg/h via INTRAVENOUS
  Filled 2017-08-29 (×2): qty 200

## 2017-08-29 MED ORDER — SODIUM CHLORIDE 0.9 % IV BOLUS (SEPSIS)
500.0000 mL | Freq: Once | INTRAVENOUS | Status: AC
  Administered 2017-08-29: 500 mL via INTRAVENOUS

## 2017-08-29 NOTE — Progress Notes (Addendum)
I spoke with pt's brother by phone to discuss rehab venue options for pt. 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 [name] works, and his sister has also had 2 CVAs. They have not yet discussed.  I will discuss with acute SW need for SNF. Pt has Medicare part A. We will sign off. [phone]

## 2017-08-29 NOTE — Progress Notes (Addendum)
  Progress Note    08/29/2017 10:00 AM 1 Day Post-Op  Subjective:  Aphasic but nods to answer questions.    Afebrile HR  70's-120's Afib 80's-100's systolic 99% Platter  Vitals:   08/29/17 0500 08/29/17 0730  BP: 93/71 93/72  Pulse: 72 (!) 32  Resp: 17 10  Temp:  98.3 F (36.8 C)  SpO2: 99% 98%    Physical Exam: Cardiac:  irregular Lungs:  Non labored Incisions:  fasciotomy incisions look good-left lateral incision with some bloody drainage Extremities:  monophasic right PT and monophasic left PT/peroneal.  Right foot is cooler than the left.  He is not having any pain in his feet.  Able to flex feet but not wiggle toes.  Sensation is in tact.     CBC    Component Value Date/Time   WBC 11.2 (H) 08/29/2017 0300   RBC 3.67 (L) 08/29/2017 0300   HGB 11.2 (L) 08/29/2017 0300   HCT 33.3 (L) 08/29/2017 0300   PLT 143 (L) 08/29/2017 0300   MCV 90.7 08/29/2017 0300   MCH 30.5 08/29/2017 0300   MCHC 33.6 08/29/2017 0300   RDW 13.0 08/29/2017 0300   LYMPHSABS 1.2 08/22/2017 1612   MONOABS 1.0 08/22/2017 1612   EOSABS 0.0 08/22/2017 1612   BASOSABS 0.0 08/22/2017 1612    BMET    Component Value Date/Time   NA 134 (L) 08/29/2017 0300   K 4.1 08/29/2017 0300   CL 104 08/29/2017 0300   CO2 25 08/29/2017 0300   GLUCOSE 102 (H) 08/29/2017 0300   BUN 17 08/29/2017 0300   CREATININE 1.12 08/29/2017 0300   CALCIUM 8.1 (L) 08/29/2017 0300   GFRNONAA >60 08/29/2017 0300   GFRAA >60 08/29/2017 0300    INR    Component Value Date/Time   INR 1.09 08/22/2017 1612     Intake/Output Summary (Last 24 hours) at 08/29/17 1000 Last data filed at 08/29/17 0800  Gross per 24 hour  Intake          1633.17 ml  Output             2000 ml  Net          -366.83 ml     Assessment:  66 y.o. male is s/p:  Bilateral embolectomy iliac femoral popliteal, 4 compartment fasciotomy bilaterally 3 Days Post-Op And  Closure of fasciotomies  1 Day Post-Op  Plan: -pt with monophasic  right PT and monophasic left PT/peroneal.  Right foot is cooler than the left.  He is not having any pain in his feet.  Able to flex feet but not wiggle toes.  Sensation is in tact.   -fasciotomy incisions look good-left lateral incision with some bloody drainage-will continue to monitor.   Acute surgical blood loss anemia-only down slightly from yesterday. -DVT prophylaxis:  Heparin gtt -PT/OT and mobilize   Doreatha MassedSamantha Rhyne, PA-C [facility] [phone] 08/29/2017 10:00 AM  Agree with above.  Doppler flow in right and left leg not as good as yesterday. Will get ABIs and duplex both legs today. If flow in marginal may need to return to OR tomorrow for popliteal and tibial embolectomy.    Will place order for NPO p midnight Keep heparin drip going Dressing changed no hematoma or active bleeding  Fabienne Brunsharles Fields, MD [facility] [place] Office: [phone] Pager: [phone]

## 2017-08-29 NOTE — Progress Notes (Addendum)
VASCULAR LAB PRELIMINARY  ARTERIAL  ABI completed:    RIGHT    LEFT    PRESSURE WAVEFORM  PRESSURE WAVEFORM  BRACHIAL 80 Triphasic BRACHIAL IV Triphaic  DP  Barely audible DP  Not found  AT 56 Monophasic AT  Not found  PT 63 Monophasic PT 57 Biphasic possibly Triphasic     PER  Not found       NA    RIGHT LEFT  ABI 0.79 0.71   Technically difficult due to cardiac arrhythmia, tachycardia, and decrease blood pressure. Left - ABI indicates a moderate reduction in arterial flow at rest however Right - ABIs indicate a mild reduction in arterial flow at rest. Bilateral - Doppler waveforms may suggest a false elevation of pressures.  Shannel Zahm, RVS 08/29/2017, 1:00 PM

## 2017-08-29 NOTE — NC FL2 (Signed)
[place] MEDICAID FL2 LEVEL OF CARE SCREENING TOOL     IDENTIFICATION  Patient Name: [name] [name] Birthdate: 11/13/1951 Sex: male Admission Date (Current Location): 08/22/2017  [facility] [place]   Facility and Address:  [facility] [place], [address], [place], [place] [address]      Provider Number: [id number]  Attending Physician Name and Address:  Edsel PetrinMikhail, Maryann, DO  Relative Name and Phone Number:       Current Level of Care: Hospital Recommended Level of Care: Skilled Nursing Facility Prior Approval Number:    Date Approved/Denied:   PASRR Number: [id number]  Discharge Plan: SNF    Current Diagnoses: Patient Active Problem List   Diagnosis Date Noted  . Cerebrovascular accident (CVA) (HCC)   . Ischemic leg   . Dysphagia, post-stroke   . Acute on chronic combined systolic and diastolic CHF (congestive heart failure) (HCC)   . Tachypnea   . Paroxysmal atrial fibrillation (HCC)   . Leukocytosis   . Acute blood loss anemia   . Hyponatremia   . Pressure injury of skin 08/27/2017  . Hypernatremia 08/24/2017  . Bradycardia 08/24/2017  . Right hemiparesis (HCC) 08/22/2017  . Volume depletion 08/22/2017  . Altered mental status 08/22/2017  . Aphasia 08/22/2017  . AKI (acute kidney injury) (HCC) 08/22/2017  . General weakness 08/22/2017  . Acute cerebrovascular accident (CVA) (HCC) 08/22/2017  . Acute CVA (cerebrovascular accident) (HCC) 08/22/2017    Orientation RESPIRATION BLADDER Height & Weight        Normal Incontinent, External catheter Weight: 178 lb 5.6 oz (80.9 kg) Height:  6\' 1"  (185.4 cm)  BEHAVIORAL SYMPTOMS/MOOD NEUROLOGICAL BOWEL NUTRITION STATUS      Continent Diet (see DC summary)  AMBULATORY STATUS COMMUNICATION OF NEEDS Skin   Extensive Assist Verbally PU Stage and Appropriate Care PU Stage 1 Dressing:  (one on sacrum and one on hip- foam dressing) PU Stage 2 Dressing:  (one on hip and one  on ischial- foam dressing)                   Personal Care Assistance Level of Assistance  Bathing, Dressing Bathing Assistance: Maximum assistance   Dressing Assistance: Maximum assistance     Functional Limitations Info             SPECIAL CARE FACTORS FREQUENCY  PT (By licensed PT), OT (By licensed OT), Speech therapy     PT Frequency: 5/wk OT Frequency: 5/wk     Speech Therapy Frequency: 2/wk      Contractures      Additional Factors Info  Code Status, Allergies Code Status Info: FULL Allergies Info: NKA           Current Medications (08/29/2017):  This is the current hospital active medication list Current Facility-Administered Medications  Medication Dose Route Frequency Provider Last Rate Last Dose  . 0.9 %  sodium chloride infusion   Intravenous Continuous Edsel PetrinMikhail, Maryann, DO 100 mL/hr at 08/29/17 1330    . acetaminophen (TYLENOL) tablet 325-650 mg  325-650 mg Oral Q4H PRN Sherren KernsFields, [name], MD   650 mg at 08/29/17 0025   Or  . acetaminophen (TYLENOL) suppository 325-650 mg  325-650 mg Rectal Q4H PRN Sherren KernsFields, [name], MD      . acetaminophen (TYLENOL) tablet 650 mg  650 mg Oral Q6H PRN Lars MassonNelson, Katarina H, MD      . alum & mag hydroxide-simeth (MAALOX/MYLANTA) 200-200-20 MG/5ML suspension 15-30 mL  15-30 mL Oral Q2H PRN Sherren Kerns, MD      . amiodarone (NEXTERONE PREMIX) 360-4.14 MG/200ML-% (1.8 mg/mL) IV infusion  30 mg/hr Intravenous Continuous Dunn, Dayna N, PA-C 16.7 mL/hr at 08/29/17 1355 30 mg/hr at 08/29/17 1355  . aspirin tablet 325 mg  325 mg Oral Daily Edsel Petrin, DO   325 mg at 08/29/17 8119  . atorvastatin (LIPITOR) tablet 20 mg  20 mg Oral q1800 Edsel Petrin, DO   20 mg at 08/28/17 1706  . chlorhexidine (PERIDEX) 0.12 % solution 15 mL  15 mL Mouth Rinse BID Eduard Clos, MD   15 mL at 08/29/17 0949  . dextrose 5 %-0.45 % sodium chloride infusion   Intravenous Continuous Sherren Kerns, MD   Stopped at 08/27/17 1601   . docusate sodium (COLACE) capsule 100 mg  100 mg Oral Daily Sherren Kerns, MD   100 mg at 08/29/17 0949  . guaiFENesin-dextromethorphan (ROBITUSSIN DM) 100-10 MG/5ML syrup 15 mL  15 mL Oral Q4H PRN Sherren Kerns, MD      . heparin ADULT infusion 100 units/mL (25000 units/272mL sodium chloride 0.45%)  1,450 Units/hr Intravenous Continuous Edsel Petrin, DO 14.5 mL/hr at 08/29/17 0420 1,450 Units/hr at 08/29/17 0420  . hydrALAZINE (APRESOLINE) injection 5 mg  5 mg Intravenous Q20 Min PRN Sherren Kerns, MD      . labetalol (NORMODYNE,TRANDATE) injection 10 mg  10 mg Intravenous Q10 min PRN Sherren Kerns, MD      . lactated ringers infusion   Intravenous Continuous Shelton Silvas, MD   Stopped at 08/28/17 1937  . magnesium sulfate IVPB 2 g 50 mL  2 g Intravenous Daily PRN Sherren Kerns, MD      . MEDLINE mouth rinse  15 mL Mouth Rinse q12n4p Eduard Clos, MD   15 mL at 08/29/17 1145  . metoprolol tartrate (LOPRESSOR) injection 2-5 mg  2-5 mg Intravenous Q2H PRN Sherren Kerns, MD      . ondansetron [facility]) injection 4 mg  4 mg Intravenous Q6H PRN Sherren Kerns, MD   4 mg at 08/27/17 0425  . pantoprazole (PROTONIX) EC tablet 40 mg  40 mg Oral Daily Sherren Kerns, MD   40 mg at 08/29/17 0949  . phenol (CHLORASEPTIC) mouth spray 1 spray  1 spray Mouth/Throat PRN Fields, [name], MD      . potassium chloride SA (K-DUR,KLOR-CON) CR tablet 20-40 mEq  20-40 mEq Oral Daily PRN Fields, Janetta Hora, MD      . RESOURCE THICKENUP CLEAR   Oral PRN Edsel Petrin, DO      . senna-docusate (Senokot-S) tablet 1 tablet  1 tablet Oral QHS PRN Delano Metz, MD   1 tablet at 08/25/17 2131     Discharge Medications: Please see discharge summary for a list of discharge medications.  Relevant Imaging Results:  Relevant Lab Results:   Additional Information SS#: [id number]  Burna Sis, LCSW

## 2017-08-29 NOTE — Progress Notes (Signed)
Nursing called to clarify order re: amiodarone per rounding note today. Pt received 150mg  IV amiodarone yesterday. IM is presently managing blood pressure -> has come up to systolic in the 90s with about half of saline bolus in.  Reviewed with Dr. Delton SeeNelson who requested starting amiodarone drip at 30mg /hr. Ronie Spiesayna Dunn PA-C

## 2017-08-29 NOTE — Progress Notes (Signed)
VASCULAR LAB PRELIMINARY  PRELIMINARY  PRELIMINARY  PRELIMINARY  Bilateral lower extremity arterial duplex completed.    Preliminary report:  Right -  Mobile thrombus noted in the proximal superficial femoral artery. Thrombus noted also in the mid and distal superficial femoral artery, profunda, and popliteal. Diminishing velocities noted from the proximal thigh to the pedal arteries.Left - Thrombus noted in the distal superficial femoral and popliteal arteries. There is a significant stenosis 30% to 49% in the distal superficial femoral artery. There is diminshing velocities from the proximal thigh to the pedal arteries.  Carlyann Placide, RVS 08/29/2017, 6:17 PM

## 2017-08-29 NOTE — Progress Notes (Addendum)
PROGRESS NOTE    [name] [name]  ZOX:[id number] DOB: [birth year] DOA: 08/22/2017 PCP: Patient, No Pcp Per   Chief Complaint  Patient presents with  . Altered Mental Status    Brief Narrative:  HPI on 08/22/2017 by [name] ZED [name] [name] is a 66 y.o. male with unknown prior medical history, per ED notes a family member found him on the floor in urine, they went by his home because he hadn't heard from him in two days. They asked the mother who was at the home how long this had been going and she also stated two days. Upon presentation the patient was completely flaccid R side, oriented name only, unable to speak and wouldn't follow commands. 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  We are asked to see for admission.   Patient unable to speak, but does weakly respond to simple questions.   Per pt's step- brother, [name] [name], the patient has never been sick.  Walks every day, doesn't smoke or drink.  He grew up in [place], [place].  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  Never went to the doctor, never complained about anything.  He retired 5-10 yrs ago.  He doesn't take any medication that the step-brother knows of.   Their mother had a stroke last year and the patient has been looking after her. It can be stressful for the stepbrother.     Interim history Found to have acute CVA and transferred to [facility] from [facility]. Neurology consulted and following. Patient also found to have sinus bradycardia, cardiology consulted and appreciated. Patient plan for TEE with possible loop recorder on 08/28/2017. Patient noted to have absent pulses in the lower ext B/L, doppler showed total occlusions of arteries. Placed on IV heparin, vascular surgery consulted- s/p fasciotomy.Patient also developed A fib with RVR.  Assessment & Plan   Acute left MCA CVA -Presented with right-sided hemoperfusion as well as expressive aphasia -CT head: Acute left  MCA territory infarct with left basal gain suspected petechial hemorrhage, 3 mm rightward midline shift -MRI brain: Large acute left MCA territory infarct most confluent along the insula, operular, basal ganglia, with petechial hemorrhage within the left striatum, cytotoxic edema causes 5mm of midlinie shift.  -MRA head: Left M1 occlusion, advanced right P2 stenosis -Carotid Doppler: Bilateral ICA 1-39% stenosis, vertebral artery flow antegrade -Echocardiogram shows an EF of 55%, no source of emboli -LDL 78, hemoglobin A1c 5.2 -Speech therapy working with patient, currently on dysphagia 1 diet -Continue aspirin and statin -Cardiology consulted for TEE and possible loop recorder, will now occur on 08/30/2017 -PT/OT consulted and recommended [name] -Inpatient reheb consulted  Peripheral vascular disease/ Bilateral lower extremity Ischemia -LE cool to touch, unable to palpate pulses  -obtained LE doppler: unremarkable for DVT. Incidental findings of age intermediate obstructive thrombosis involving the common femoral, superficial femoral, popliteal, posterior tibial, peroneal, anterior tibial arteries bilaterally -ABI was cancelled -Discussed with neurology the use of heparin, Dr. Roda Shutters recommended using low dose heparin without a bolus.  -Vascular surgery consultation appreciated.  -placed on heparin drip -CTA AO+BiFEM: Near occlusive thrombus within right common iliac vessel with occlusive thrombus involving the right external iliac, common femoral, superficial femoral, right popliteal vessels. Occlusive thrombus within proximal profunda artery with distal flow present. Near occlusive to occlusive thrombus with the left external iliac artery. Nonocclusive thrombus in the celiac trunk which extends to the left splenic artery. Wedge shaped hypodensity in  the spleen suspicious for an infarct. -s/p Bilateral embolectomy iliac femoral popliteal, 4 compartment fasciotomy bilaterally  -s/p closure of B/L leg  fasciotomies on 08/28/2017  New Atrial fibrillation with RVR -Cardiology consulted and appreciated -currently on heparin and amiodarone -HR better controlled -Discussed with cardiology, question possibility of sick sinus syndrome  Sinus bradycardia -HR in the 30s and patient asymptomatic -Cardiology consultation appreciated feels this could be due to increased vagal tone following CVA -Does not feel patient needs pacing at this time- however patient developed AF with RVR, and heart rate has been ranging from 32-92 over past few hours  Hypotension -will continue to use NS boluses and placed patient on IVF -continue to monitor closely, may need pressor support if BP does not respond to IVF  Dysuria -UA unremarkable for infection x 2, UA did show large hemoglobin -suspect secondary to foley catheter placement (trauma) -appears to have resolved  Dehydration/hypernatremia- now hyponatremic -Placed on IV fluids, sodium peaked to 150 -IVF were changed to D5W while patient was hypernatremic -Now on NS for hyponatremia and hypotenson, sodium 134 -monitor BMP  Acute kidney injury -Possibly secondary to dehydration and acute mild rhabdomyolysis -Presented with creatinine of 1.43 -Creatinine improved with IVF, now 1.12 -Continue to monitor BMP  Mild rhabdomyolysis -Likely secondary to the above -CK down to 175  Leukocytosis -Suspect reactive to CVA, currently WBC trending downward -Patient currently afebrile -Blood cultures show no growth to date -UA and chest x-ray unremarkable for infection  Hyperlipidemia -started on statin  DVT Prophylaxis   heparin  Code Status: Full  Family Communication: None at bedside  Disposition Plan: Admitted.Continue to monitor in stepdown. Pending TEE/loop on 9/12. Dispo pending   Consultants Neurology Cardiology Vascular surgery Inpatient rehab  Procedures  Echocardiogram Lower extremity doppler Lower ext 4 compartment fasciotomy  bilaterally Closure of LE fasciotomies   Antibiotics   Anti-infectives    Start     Dose/Rate Route Frequency Ordered Stop   08/28/17 0600  cefUROXime (ZINACEF) 1.5 g in dextrose 5 % 50 mL IVPB     1.5 g 100 mL/hr over 30 Minutes Intravenous On call to O.R. 08/27/17 1502 08/29/17 0323   08/27/17 0500  cefUROXime (ZINACEF) 1.5 g in dextrose 5 % 50 mL IVPB     1.5 g 100 mL/hr over 30 Minutes Intravenous Every 12 hours 08/26/17 2132 08/27/17 1753   08/26/17 1600  cefUROXime (ZINACEF) 1.5 g in dextrose 5 % 50 mL IVPB     1.5 g 100 mL/hr over 30 Minutes Intravenous To Surgery 08/26/17 1543 08/27/17 0502   08/22/17 1715  vancomycin (VANCOCIN) IVPB 1000 mg/200 mL premix     1,000 mg 200 mL/hr over 60 Minutes Intravenous  Once 08/22/17 1710 08/22/17 1935   08/22/17 1715  piperacillin-tazobactam (ZOSYN) IVPB 3.375 g     3.375 g 100 mL/hr over 30 Minutes Intravenous  Once 08/22/17 1710 08/22/17 1838      Subjective:   Finlee Herdt seen and examined today.  Complains of chest pain and headache, but feels headache has improved mildly. Denies shortness of breath, nausea, vomiting, diarrhea, constipation, abdominal pain.   Objective:   Vitals:   08/29/17 0500 08/29/17 0730 08/29/17 0800 08/29/17 1159  BP: 93/71 93/72 96/71    Pulse: 72 (!) 32 92 (!) 38  Resp: 17 10 (!) 27 18  Temp:  98.3 F (36.8 C)  (!) 97.5 F (36.4 C)  TempSrc:  Oral  Oral  SpO2: 99% 98% 100%   Weight:  Height:        Intake/Output Summary (Last 24 hours) at 08/29/17 1207 Last data filed at 08/29/17 0800  Gross per 24 hour  Intake          2033.17 ml  Output             2000 ml  Net            33.17 ml   Filed Weights   08/23/17 0210 08/23/17 0500 08/23/17 2200  Weight: 78.5 kg (173 lb 1 oz) 78.5 kg (173 lb 1 oz) 80.9 kg (178 lb 5.6 oz)   Exam  General: Well developed, well nourished, no distress  HEENT: NCAT, Sclera, mucous membranes moist.   Cardiovascular: S1 S2 auscultated, irregularly  irregular  Respiratory: Clear to auscultation bilaterally with equal chest rise  Abdomen: Soft, nontender, nondistended, + bowel sounds  Extremities: warm dry without cyanosis clubbing or edema. Dressing in place on LE B/L  Neuro: awake and alert, orientation difficult to assess given aphasia. Otherwise, Nonfocal  Psych: Apppropriate  Data Reviewed: I have personally reviewed following labs and imaging studies  CBC:  Recent Labs Lab 08/22/17 1612  08/23/17 0425 08/24/17 0102 08/27/17 0411 08/28/17 0207 08/29/17 0300  WBC 15.4*  --  14.6* 10.9* 13.3*  13.4* 12.1* 11.2*  NEUTROABS 13.1*  --   --   --   --   --   --   HGB 17.2*  < > 15.2 13.5 13.1  12.9* 11.8* 11.2*  HCT 50.6  < > 46.4 41.4 38.4*  38.2* 35.4* 33.3*  MCV 92.8  --  96.3 93.5 89.5  89.9 90.3 90.7  PLT 185  --  133* 134* 154  148* 145* 143*  < > = values in this interval not displayed. Basic Metabolic Panel:  Recent Labs Lab 08/24/17 0647 08/25/17 0709 08/27/17 0411 08/28/17 0207 08/29/17 0300  NA 147* 144 139  137 138 134*  K 3.9 4.6 4.6  4.4 3.9 4.1  CL 114* 115* 105  103 105 104  CO2 GLUCOSE 84 86 147*  146* 118* 102*  BUN 25* 22* CREATININE 1.20 1.07 1.18  1.19 1.12 1.12  CALCIUM 8.5* 8.4* 8.3*  8.2* 8.2* 8.1*   GFR: Estimated Creatinine Clearance: 72.3 mL/min (by C-G formula based on SCr of 1.12 mg/dL). Liver Function Tests:  Recent Labs Lab 08/22/17 1612 08/23/17 1007  AST 47* 33  ALT 30 26  ALKPHOS 56 44  BILITOT 1.5* 1.3*  PROT 8.2* 6.4*  ALBUMIN 4.1 3.1*   No results for input(s): LIPASE, AMYLASE in the last 168 hours.  Recent Labs Lab 08/22/17 1643  AMMONIA 23   Coagulation Profile:  Recent Labs Lab 08/22/17 1612  INR 1.09   Cardiac Enzymes:  Recent Labs Lab 08/22/17 1650 08/22/17 2155 08/23/17 0425 08/23/17 1007 08/27/17 0411  CKTOTAL 709*  --   --  380 175  TROPONINI  --  <0.03 <0.03 <0.03  --    BNP (last 3  results) No results for input(s): PROBNP in the last 8760 hours. HbA1C: No results for input(s): HGBA1C in the last 72 hours. CBG:  Recent Labs Lab 08/23/17 2249 08/24/17 0353 08/26/17 2105 08/27/17 0839 08/28/17 1144  GLUCAP 86 79 92 126* 97   Lipid Profile: No results for input(s): CHOL, HDL, LDLCALC, TRIG, CHOLHDL, LDLDIRECT in the last 72 hours. Thyroid Function Tests: No results for input(s): TSH, T4TOTAL,  FREET4, T3FREE, THYROIDAB in the last 72 hours. Anemia Panel: No results for input(s): VITAMINB12, FOLATE, FERRITIN, TIBC, IRON, RETICCTPCT in the last 72 hours. Urine analysis:    Component Value Date/Time   COLORURINE YELLOW 08/25/2017 1026   APPEARANCEUR HAZY (A) 08/25/2017 1026   LABSPEC 1.016 08/25/2017 1026   PHURINE 5.0 08/25/2017 1026   GLUCOSEU NEGATIVE 08/25/2017 1026   HGBUR LARGE (A) 08/25/2017 1026   BILIRUBINUR NEGATIVE 08/25/2017 1026   KETONESUR NEGATIVE 08/25/2017 1026   PROTEINUR 30 (A) 08/25/2017 1026   NITRITE NEGATIVE 08/25/2017 1026   LEUKOCYTESUR MODERATE (A) 08/25/2017 1026   Sepsis Labs: (procalcitonin:4,lacticidven:4)  ) Recent Results (from the past 240 hour(s))  Blood Culture (routine x 2)     Status: None   Collection Time: 08/22/17  4:40 PM  Result Value Ref Range Status   Specimen Description RIGHT ANTECUBITAL  Final   Special Requests   Final    BOTTLES DRAWN AEROBIC AND ANAEROBIC Blood Culture adequate volume   Culture NO GROWTH 5 DAYS  Final   Report Status 08/27/2017 FINAL  Final  Blood Culture (routine x 2)     Status: None   Collection Time: 08/22/17  4:44 PM  Result Value Ref Range Status   Specimen Description BLOOD LEFT HAND  Final   Special Requests   Final    BOTTLES DRAWN AEROBIC AND ANAEROBIC Blood Culture adequate volume   Culture NO GROWTH 5 DAYS  Final   Report Status 08/27/2017 FINAL  Final  MRSA PCR Screening     Status: None   Collection Time: 08/23/17  1:59 AM  Result Value Ref Range Status    MRSA by PCR NEGATIVE NEGATIVE Final    Comment:        The GeneXpert MRSA Assay (FDA approved for NASAL specimens only), is one component of a comprehensive MRSA colonization surveillance program. It is not intended to diagnose MRSA infection nor to guide or monitor treatment for MRSA infections.   Urine Culture     Status: None   Collection Time: 08/25/17 10:26 AM  Result Value Ref Range Status   Specimen Description URINE, CLEAN CATCH  Final   Special Requests NONE  Final   Culture NO GROWTH  Final   Report Status 08/26/2017 FINAL  Final      Radiology Studies: No results found.   Scheduled Meds: . aspirin  325 mg Oral Daily  . atorvastatin  20 mg Oral q1800  . chlorhexidine  15 mL Mouth Rinse BID  . docusate sodium  100 mg Oral Daily  . mouth rinse  15 mL Mouth Rinse q12n4p  . pantoprazole  40 mg Oral Daily   Continuous Infusions: . sodium chloride 100 mL/hr at 08/29/17 0420  . dextrose 5 % and 0.45% NaCl Stopped (08/27/17 1601)  . heparin 1,450 Units/hr (08/29/17 0420)  . lactated ringers Stopped (08/28/17 1937)  . magnesium sulfate 1 - 4 g bolus IVPB       LOS: 7 days   Time Spent in minutes   45 minutes  Malasia Torain D.O. on 08/29/2017 at 12:07 PM  Between 7am to 7pm - Pager - [phone]  After 7pm go to [email or web address] - password [email or web address]  And look for the night coverage person covering for me after hours  [facility] Office  [phone]

## 2017-08-29 NOTE — Progress Notes (Signed)
Physical Therapy Treatment Patient Details Name: [name] [name] MRN: [id number] DOB: July 18, 1951 Today's Date: 08/29/2017    History of Present Illness Patient is a 66 y/o male who was found down at home covered in urine. 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 No PMH as pt has not been to the doctor. Noted bilateral leg ischemia on 9/8; to OR for Bilateral embolectomy iliac femoral popliteal, 4 compartment fasciotomy bilaterally that evening, with closure of fasciotomies 08/28/17.    PT Comments    Pt with expressive aphasia, apraxia and non-verbal who was able to demonstrate increased ability with sitting balance, transfers and tolerated standing x 3 min today. Pt with LLE lateral wound bleeding after standing and deferred transfer OOB at this time due to return to bed for wound care. Pt able to move bil LE with ability to maintain standing without overt buckling. Pt with assist to shift weight to left in standing and performed standing with better trunk control with use of bed rail as hand hold in standing. Will continue to follow.   HR 103-152 in standing BP 100/82 in standing 95/69 with return to supine SpO2 90% on RA   Follow Up Recommendations  [name]     Equipment Recommendations       Recommendations for Other Services       Precautions / Restrictions Precautions Precautions: Fall Precaution Comments: RUE hemiparesis, right inattention, aphasia Restrictions Weight Bearing Restrictions: No    Mobility  Bed Mobility Overal bed mobility: Needs Assistance Bed Mobility: Supine to Sit;Sit to Supine     Supine to sit: Mod assist;+2 for physical assistance Sit to supine: Mod assist   General bed mobility comments: Cues for sequencing and hand placement. Assist to raise trunk scoot hips with use of bed pad and for balance initially in sitting, assist for LEs and to guide trunk with return to supine  Transfers Overall transfer level: Needs  assistance Equipment used: 2 person hand held assist Transfers: Sit to/from Stand Sit to Stand: +2 physical assistance;Mod assist         General transfer comment: assist to rise, shift weight toward L and for balance in standing. Initially stood 30 sec with LUE support on PT arm but pt maintained flexed posture with bil knees blocked. Returned to sitting and scooting pt toward HOB from EOB max +2 then utilized lower rail as hand hold and pt stood 2nd time x 3 min  Ambulation/Gait                 Stairs            Wheelchair Mobility    Modified Rankin (Stroke Patients Only) Modified Rankin (Stroke Patients Only) Pre-Morbid Rankin Score: No symptoms Modified Rankin: Severe disability     Balance Overall balance assessment: Needs assistance Sitting-balance support: Feet supported;No upper extremity supported Sitting balance-Leahy Scale: Fair Sitting balance - Comments: Pt sat EOB with progression of min to minguard assist with reaching and propping on RUE performed with OT   Standing balance support: Single extremity supported Standing balance-Leahy Scale: Poor Standing balance comment: no knee buckling, requires +2 mod assist for weight shift toward L and upright posture, stood x 3 minutes. with cues and assist for trunk extension, head up and safety                            Cognition Arousal/Alertness: Awake/alert Behavior During Therapy: Flat affect  Overall Cognitive Status: Difficult to assess Area of Impairment: Following commands;Problem solving                 Orientation Level: Disoriented to;Place;Time Current Attention Level: Sustained   Following Commands: Follows multi-step commands consistently     Problem Solving: Slow processing;Decreased initiation;Difficulty sequencing;Requires verbal cues;Requires tactile cues General Comments: pt continues to have unreliable yes/no response, pt remains non verbal      Exercises       General Comments        Pertinent Vitals/Pain Pain Assessment:  (CPOT= 0) Pain Score:  (CPOT=0) Faces Pain Scale: No hurt    Home Living                      Prior Function            PT Goals (current goals can now be found in the care plan section) Acute Rehab PT Goals Patient Stated Goal: none stated but most likely to return to PLOF Progress towards PT goals: Progressing toward goals    Frequency    Min 4X/week      PT Plan Current plan remains appropriate    Co-evaluation PT/OT/SLP Co-Evaluation/Treatment: Yes Reason for Co-Treatment: Complexity of the patient's impairments (multi-system involvement);For patient/therapist safety PT goals addressed during session: Mobility/safety with mobility;Balance        AM-PAC PT "6 Clicks" Daily Activity  Outcome Measure  Difficulty turning over in bed (including adjusting bedclothes, sheets and blankets)?: A Lot Difficulty moving from lying on back to sitting on the side of the bed? : Unable Difficulty sitting down on and standing up from a chair with arms (e.g., wheelchair, bedside commode, etc,.)?: Unable Help needed moving to and from a bed to chair (including a wheelchair)?: Total Help needed walking in hospital room?: Total Help needed climbing 3-5 steps with a railing? : Total 6 Click Score: 7    End of Session Equipment Utilized During Treatment: Gait belt Activity Tolerance: Patient tolerated treatment well Patient left: in bed;with call bell/phone within reach;with nursing/sitter in room Nurse Communication: Mobility status;Other (comment) (pt with LLE lateral wound bleeding after standing) PT Visit Diagnosis: Hemiplegia and hemiparesis;Unsteadiness on feet (R26.81);Difficulty in walking, not elsewhere classified (R26.2) Hemiplegia - Right/Left: Right Hemiplegia - dominant/non-dominant: Dominant Hemiplegia - caused by: Cerebral infarction     Time: [id number] PT Time Calculation (min)  (ACUTE ONLY): 42 min  Charges:  $Therapeutic Activity: 8-22 mins $Neuromuscular Re-education: 8-22 mins                    G Codes:       [name] [name], PT [phone]    [name] [name] 08/29/2017, 11:21 AM

## 2017-08-29 NOTE — Progress Notes (Signed)
Pt working with PT/OT when left-lateral fasciotomy incision begin to bleed. Pressure held on site and wrapped. Primary MD made aware and stated to page vascular and continue heparin gtt. Vascular at bedside to evaluate.

## 2017-08-29 NOTE — Progress Notes (Signed)
  Speech Language Pathology Treatment:    Patient Details Name: [name] [name] MRN: [id number] DOB: 12/30/1950 Today's Date: 08/29/2017 Time: [id number] SLP Time Calculation (min) (ACUTE ONLY): 26 min  Assessment / Plan / Recommendation Clinical Impression  SLP provided skilled observation during breakfast meal. Pt needed Mod cues for management of R anterior spillage, pocketing, and slower pacing especially with fast intake of liquids. No overt s/s of aspiration were observed. SLP also provided Max cues for labial approximations and attempted automatic speech tasks, although no vocalizations could be elicited. When attempting writing pt did write his name correctly x1, although this was after multiple attempts to do so. Pt had <50% accuracy with simple yes/no questions and object discrimination tasks. Given his significant aphasia, apraxia, and dysphagia, he will need intensive SLP f/u.   HPI HPI: Ptis [facility] 66 [facility] large subacute MCA infarct and 5 mm midline shift. [facility]. No significant PMH is known.      SLP Plan  Continue with current plan of care       Recommendations  Diet recommendations: Dysphagia 1 (puree);Nectar-thick liquid Liquids provided via: Straw Medication Administration: Crushed with puree Supervision: Patient able to self feed;Full supervision/cueing for compensatory strategies Compensations: Slow rate;Small sips/bites;Lingual sweep for clearance of pocketing;Monitor for anterior loss Postural Changes and/or Swallow Maneuvers: Seated upright 90 degrees;Upright 30-60 min after meal                Oral Care Recommendations: Oral care BID Follow up Recommendations: Inpatient Rehab SLP Visit Diagnosis: Dysphagia, oropharyngeal phase (R13.12);Aphasia (R47.01) Plan: Continue with current plan of care       GO                [name] [name], [name] [name] 08/29/2017, 10:51  AM  [name] [name], [name] [name] [phone]

## 2017-08-29 NOTE — Progress Notes (Signed)
ANTICOAGULATION CONSULT NOTE - Follow Up Consult  Pharmacy Consult for Heparin Indication: Occludeded arteries LE B/L  No Known Allergies  Patient Measurements: Height: 6\' 1"  (185.4 cm) Weight: 178 lb 5.6 oz (80.9 kg) IBW/kg (Calculated) : 79.9 Heparin Dosing Weight: 80.9 kg  Vital Signs: Temp: 98.3 F (36.8 C) (09/11 0730) Temp Source: Oral (09/11 0730) BP: 96/71 (09/11 0800) Pulse Rate: 92 (09/11 0800)  Labs:  Recent Labs  08/27/17 0411  08/28/17 0207 08/28/17 0905 08/28/17 1955 08/29/17 0300  HGB 13.1  12.9*  --  11.8*  --   --  11.2*  HCT 38.4*  38.2*  --  35.4*  --   --  33.3*  PLT 154  148*  --  145*  --   --  143*  HEPARINUNFRC 0.53  < > 0.20* 0.31 0.23* 0.34  CREATININE 1.18  1.19  --  1.12  --   --  1.12  CKTOTAL 175  --   --   --   --   --   < > = values in this interval not displayed.  Estimated Creatinine Clearance: 72.3 mL/min (by C-G formula based on SCr of 1.12 mg/dL).   Assessment: [name] who was found down with AMS on 9/4 and brought into the [facility]. Work-up on admission revealed a new acute L-MCA CVA. Dopplers done 9/8 show occluded B/L LE arteries and pharmacy was consulted to start heparin for anticoagulation and to hold boluses in the setting of a recent CVA.   The patient was noted to be taken for emergent embolectomies/fasciotomies 9/8 with heparin resumed post-op. He continues on heparin and his current heparin level is therapeutic. His CBC is stable. Per vascular surgery notes today he has some bloody drainage from his incision sites but no active bleeding or hematoma.  Goal of Therapy:  Heparin level 0.3-0.5 units/ml Monitor platelets by anticoagulation protocol: Yes   Plan:  Continue heparin to 1450 units / hr Follow up AM labs  Sallee Provencalurner, Mukhtar Shams S 08/29/2017 11:29 AM

## 2017-08-29 NOTE — Plan of Care (Signed)
Problem: Coping: Goal: Ability to verbalize positive feelings about self will improve Outcome: Not Progressing Pt not able to speak Goal: Ability to identify appropriate support needs will improve Outcome: Not Progressing Pt not able to speak  Problem: Self-Care: Goal: Ability to communicate needs accurately will improve Outcome: Not Progressing Pt not able to speak

## 2017-08-29 NOTE — Progress Notes (Addendum)
Occupational Therapy Treatment Patient Details Name: [name] CreekRichard P Well MRN: [id number] DOB: 05/02/1951 Today's Date: 08/29/2017    History of present illness Patient is a 66 y/o male who was found down at home covered in urine. 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 No PMH as pt has not been to the doctor. Noted bilateral leg ischemia on 9/8; to OR for Bilateral embolectomy iliac femoral popliteal, 4 compartment fasciotomy bilaterally that evening, with closure of fasciotomies 08/28/17.   OT comments  Focus of session on bed mobility, trunk control and strengthening, sit to stand and static standing balance at EOB. No buckling of knees with pt able to tolerate standing x 3 minutes with +2 assist. No LOB with moderate reaching outside of BoS in sitting. VS monitored throughout with pt continuing to experience low BP, which improves with mobility, HR to 150 non sustained. Will continue to follow.  Follow Up Recommendations  [name]    Equipment Recommendations       Recommendations for Other Services      Precautions / Restrictions Precautions Precautions: Fall Restrictions Weight Bearing Restrictions: No       Mobility Bed Mobility Overal bed mobility: Needs Assistance Bed Mobility: Supine to Sit;Sit to Supine     Supine to sit: Mod assist;+2 for physical assistance Sit to supine: Mod assist   General bed mobility comments: Cues for sequencing and hand placement. Assist to raise trunk scoot hips with use of bed pad and for balance initially in sitting, assist for LEs and to guide trunk with return to supine  Transfers Overall transfer level: Needs assistance Equipment used: 2 person hand held assist Transfers: Sit to/from Stand Sit to Stand: +2 physical assistance;Mod assist         General transfer comment: assist to rise, shift weight toward L and for balance in standing, transfer deferred due to L LE bleeding    Balance Overall balance  assessment: Needs assistance Sitting-balance support: Feet supported;No upper extremity supported Sitting balance-Leahy Scale: Fair Sitting balance - Comments: able to reach in small ranges outside of BoS for ADL items without LOB, worked on weightbearing on R elbow and returning to midline with moderate assistance     Standing balance-Leahy Scale: Poor Standing balance comment: no knee buckling, requires +2 mod assist for weight shift toward L and upright posture, stood x 3 minutes                           ADL either performed or assessed with clinical judgement   ADL Overall ADL's : Needs assistance/impaired     Grooming: Brushing hair;Sitting;Moderate assistance Grooming Details (indicate cue type and reason): assist to orient teeth of comb toward head             Lower Body Dressing: Total assistance;Bed level Lower Body Dressing Details (indicate cue type and reason): to don socks                     Vision   Additional Comments: pt with bleeding of L LE during standing, RN in room and provided pressure and reinforced dressing   Perception     Praxis      Cognition Arousal/Alertness: Awake/alert Behavior During Therapy: Flat affect Overall Cognitive Status: Difficult to assess Area of Impairment: Following commands;Problem solving  Following Commands: Follows multi-step commands consistently (2 step)     Problem Solving: Slow processing;Decreased initiation;Difficulty sequencing;Requires verbal cues;Requires tactile cues General Comments: pt continues to have unreliable yes/no response, pt remains non verbal        Exercises     Shoulder Instructions       General Comments      Pertinent Vitals/ Pain       Pain Assessment: Faces Pain Score:  (CPOT=0) Faces Pain Scale: No hurt  Home Living                                          Prior Functioning/Environment               Frequency  Min 3X/week        Progress Toward Goals  OT Goals(current goals can now be found in the care plan section)  Progress towards OT goals: Progressing toward goals  Acute Rehab OT Goals Patient Stated Goal: none stated but most likely to return to PLOF OT Goal Formulation: With patient Time For Goal Achievement: 09/08/17 Potential to Achieve Goals: Good  Plan Discharge plan remains appropriate    Co-evaluation    PT/OT/SLP Co-Evaluation/Treatment: Yes            AM-PAC PT "6 Clicks" Daily Activity     Outcome Measure   Help from another person eating meals?: A Little Help from another person taking care of personal grooming?: A Lot Help from another person toileting, which includes using toliet, bedpan, or urinal?: A Lot Help from another person bathing (including washing, rinsing, drying)?: A Lot Help from another person to put on and taking off regular upper body clothing?: A Lot Help from another person to put on and taking off regular lower body clothing?: Total 6 Click Score: 12    End of Session    OT Visit Diagnosis: Unsteadiness on feet (R26.81);Cognitive communication deficit (R41.841);Hemiplegia and hemiparesis Symptoms and signs involving cognitive functions: Cerebral infarction Hemiplegia - Right/Left: Right Hemiplegia - dominant/non-dominant: Dominant Hemiplegia - caused by: Cerebral infarction   Activity Tolerance Patient tolerated treatment well   Patient Left in bed;with call bell/phone within reach;with nursing/sitter in room   Nurse Communication Mobility status (L LE bleeding)        Time: 1610-9604 OT Time Calculation (min): 43 min  Charges: OT General Charges $OT Visit: 1 Visit OT Treatments $Therapeutic Activity: 8-22 mins    08/29/2017 Martie Round, OTR/L Pager: [phone] Evern Bio 08/29/2017, 10:53 AM

## 2017-08-29 NOTE — Progress Notes (Addendum)
Progress Note  Patient Name: [name] CroakRichard P Ashbaugh Date of Encounter: 08/29/2017  Primary Cardiologist: new patient  Subjective   The patient is aphasic but shows that he has chest pain and headache.  Inpatient Medications    Scheduled Meds: . aspirin  325 mg Oral Daily  . atorvastatin  20 mg Oral q1800  . chlorhexidine  15 mL Mouth Rinse BID  . docusate sodium  100 mg Oral Daily  . mouth rinse  15 mL Mouth Rinse q12n4p  . pantoprazole  40 mg Oral Daily   Continuous Infusions: . sodium chloride 100 mL/hr at 08/29/17 0420  . dextrose 5 % and 0.45% NaCl Stopped (08/27/17 1601)  . heparin 1,450 Units/hr (08/29/17 0420)  . lactated ringers Stopped (08/28/17 1937)  . magnesium sulfate 1 - 4 g bolus IVPB     PRN Meds: acetaminophen **OR** acetaminophen, alum & mag hydroxide-simeth, guaiFENesin-dextromethorphan, hydrALAZINE, labetalol, magnesium sulfate 1 - 4 g bolus IVPB, metoprolol tartrate, ondansetron, phenol, potassium chloride, RESOURCE THICKENUP CLEAR, senna-docusate   Vital Signs    Vitals:   08/29/17 0300 08/29/17 0400 08/29/17 0500 08/29/17 0730  BP: 94/69 92/75 93/71  93/72  Pulse: 98 (!) 119 72 (!) 32  Resp: 17 19 17 10   Temp:  98.3 F (36.8 C)  98.3 F (36.8 C)  TempSrc:  Oral  Oral  SpO2: 97% 99% 99% 98%  Weight:      Height:        Intake/Output Summary (Last 24 hours) at 08/29/17 0937 Last data filed at 08/29/17 0800  Gross per 24 hour  Intake          1633.17 ml  Output             2000 ml  Net          -366.83 ml   Filed Weights   08/23/17 0210 08/23/17 0500 08/23/17 2200  Weight: 173 lb 1 oz (78.5 kg) 173 lb 1 oz (78.5 kg) 178 lb 5.6 oz (80.9 kg)   Telemetry    A-fib with RVR - Personally Reviewed  Physical Exam  GEN: No acute distress.   Neck: No JVD Cardiac: RRR, no murmurs, rubs, or gallops.  Respiratory: Clear to auscultation bilaterally. GI: Soft, nontender, non-distended  MS: No edema; No deformity. Neuro:  Nonfocal  Psych: Normal  affect   Labs    Chemistry Recent Labs Lab 08/22/17 1612  08/23/17 1007  08/27/17 0411 08/28/17 0207 08/29/17 0300  NA 146*  < > 147*  < > 139  137 138 134*  K 4.3  < > 4.2  < > 4.6  4.4 3.9 4.1  CL 113*  < > 117*  < > 105  103 105 104  CO2 21*  < > 22  < > 27  26 26 25   GLUCOSE 117*  < > 99  < > 147*  146* 118* 102*  BUN 45*  < > 34*  < > 12  12 18 17   CREATININE 1.64*  < > 1.24  < > 1.18  1.19 1.12 1.12  CALCIUM 9.3  < > 8.5*  < > 8.3*  8.2* 8.2* 8.1*  PROT 8.2*  --  6.4*  --   --   --   --   ALBUMIN 4.1  --  3.1*  --   --   --   --   AST 47*  --  33  --   --   --   --  ALT 30  --  26  --   --   --   --   ALKPHOS 56  --  44  --   --   --   --   BILITOT 1.5*  --  1.3*  --   --   --   --   GFRNONAA 42*  < > 58*  < > >60  >60 >60 >60  GFRAA 48*  < > >60  < > >60  >60 >60 >60  ANIONGAP 12  < > 8  < > < > = values in this interval not displayed.   Hematology  Recent Labs Lab 08/27/17 0411 08/28/17 0207 08/29/17 0300  WBC 13.3*  13.4* 12.1* 11.2*  RBC 4.29  4.25 3.92* 3.67*  HGB 13.1  12.9* 11.8* 11.2*  HCT 38.4*  38.2* 35.4* 33.3*  MCV 89.5  89.9 90.3 90.7  MCH 30.5  30.4 30.1 30.5  MCHC 34.1  33.8 33.3 33.6  RDW 12.9  12.7 12.9 13.0  PLT 154  148* 145* 143*   Cardiac Enzymes  Recent Labs Lab 08/22/17 2155 08/23/17 0425 08/23/17 1007  TROPONINI <0.03 <0.03 <0.03     Recent Labs Lab 08/22/17 1618  TROPIPOC 0.02    BNPNo results for input(s): BNP, PROBNP in the last 168 hours.   DDimer No results for input(s): DDIMER in the last 168 hours.   Radiology    No results found.  Cardiac Studies   TTE: 09/02/17 - Left ventricle: The cavity size was normal. Wall thickness was   normal. Systolic function was normal. The estimated ejection   fraction was 55%. Wall motion was normal; there were no regional   wall motion abnormalities. Left ventricular diastolic function   parameters were normal. - Aortic valve: Trileaflet;  mildly thickened leaflets. There was   trivial regurgitation. - Aorta: Mild aortic root dilatation. Aortic root dimension: 39 mm   (ED). - Mitral valve: There was mild regurgitation. - Left atrium: The atrium was mildly dilated. - Atrial septum: No defect or patent foramen ovale was identified. - Pulmonic valve: There was mild regurgitation.    Patient Profile     66 y.o. male   Assessment & Plan    1.  Asymptomatic sinus bradycardia on admission 2.  Peripheral vascular disease with Recent bilateral embolectomies 3.  Recent large left brain stroke 4.  New afib with RVR  The patient is awaiting TEE, DCCV on Wednesday, started on iv amiodarone yesterday, I would continue, started on heparin, a-fib started on 9/10 in the morning, ventricular rates still in 98-112 BPM. Repeat chest X ray, weight up, no significant fluid overload on physical exam. I would give tylenol for headache.  For questions or updates, please contact [facility] Please consult [email or web address] for contact info under Cardiology/STEMI. Daytime calls, contact the Day Call APP (6a-8a) or assigned team (Teams A-D) provider (7:30a - 5p). All other daytime calls (7:30-5p), contact the Card Master @ [phone].   Nighttime calls, contact the assigned APP (5p-8p) or MD (6:30p-8p). Overnight calls (8p-6a), contact the on call Fellow @ [phone].   Signed, Tobias Alexander, MD  08/29/2017, 9:37 AM

## 2017-08-30 ENCOUNTER — Inpatient Hospital Stay (HOSPITAL_COMMUNITY): Payer: Medicare Other | Admitting: Certified Registered Nurse Anesthetist

## 2017-08-30 ENCOUNTER — Encounter (HOSPITAL_COMMUNITY)
Admission: EM | Disposition: A | Discharge status: Skilled Nursing Facility | Payer: Self-pay | Source: Home / Self Care | Attending: Internal Medicine

## 2017-08-30 ENCOUNTER — Encounter (HOSPITAL_COMMUNITY): Payer: Self-pay | Admitting: Certified Registered Nurse Anesthetist

## 2017-08-30 DIAGNOSIS — I743 Embolism and thrombosis of arteries of the lower extremities: Secondary | ICD-10-CM

## 2017-08-30 DIAGNOSIS — I495 Sick sinus syndrome: Secondary | ICD-10-CM

## 2017-08-30 HISTORY — PX: EMBOLECTOMY: SHX44

## 2017-08-30 LAB — VAS US LOWER EXTREMITY ARTERIAL DUPLEX
LPTIBDISTSYS: -27 cm/s
LPTIBMIDSYS: 45 cm/s
LPTIBPROXSYS: 37 cm/s
Left ant tibial distal sys: 13 cm/s
Left popliteal prox sys PSV: 17 cm/s
Left super femoral dist sys PSV: -69 cm/s
Left super femoral mid sys PSV: -55 cm/s
Left super femoral prox sys PSV: 79 cm/s
RATIBDISTSYS: 18 cm/s
RIGHT POST TIB DIST SYS: 28 cm/s
RPOPPPSV: 47 cm/s
RPTIBPSV: 20 cm/s
RSFDPSV: -57 cm/s
RSFMPSV: -14 cm/s
RTIBMIDSYS: 27 cm/s
RTSFADISTDIA: -18 cm/s
Right super femoral prox sys PSV: 12 cm/s

## 2017-08-30 LAB — HEPARIN LEVEL (UNFRACTIONATED)
Heparin Unfractionated: 0.19 IU/mL — ABNORMAL LOW (ref 0.30–0.70)
Heparin Unfractionated: 0.39 IU/mL (ref 0.30–0.70)

## 2017-08-30 LAB — CORTISOL: Cortisol, Plasma: 10.1 ug/dL

## 2017-08-30 LAB — BASIC METABOLIC PANEL
Anion gap: 4 — ABNORMAL LOW (ref 5–15)
BUN: 17 mg/dL (ref 6–20)
CALCIUM: 8 mg/dL — AB (ref 8.9–10.3)
CO2: 25 mmol/L (ref 22–32)
CREATININE: 1.06 mg/dL (ref 0.61–1.24)
Chloride: 106 mmol/L (ref 101–111)
GFR calc Af Amer: >60 mL/min (ref >60)
GFR calc non Af Amer: >60 mL/min (ref >60)
GLUCOSE: 99 mg/dL (ref 65–99)
Potassium: 4 mmol/L (ref 3.5–5.1)
Sodium: 135 mmol/L (ref 135–145)

## 2017-08-30 LAB — CBC
HEMATOCRIT: 28.7 % — AB (ref 39.0–52.0)
Hemoglobin: 9.5 g/dL — ABNORMAL LOW (ref 13.0–17.0)
MCH: 30 pg (ref 26.0–34.0)
MCHC: 33.1 g/dL (ref 30.0–36.0)
MCV: 90.5 fL (ref 78.0–100.0)
Platelets: 160 10*3/uL (ref 150–400)
RBC: 3.17 MIL/uL — ABNORMAL LOW (ref 4.22–5.81)
RDW: 13.1 % (ref 11.5–15.5)
WBC: 11.1 10*3/uL — ABNORMAL HIGH (ref 4.0–10.5)

## 2017-08-30 LAB — TSH: TSH: 1.627 u[IU]/mL (ref 0.350–4.500)

## 2017-08-30 LAB — PREPARE RBC (CROSSMATCH)

## 2017-08-30 SURGERY — CANCELLED PROCEDURE

## 2017-08-30 SURGERY — EMBOLECTOMY
Anesthesia: General | Site: Leg Lower | Laterality: Right

## 2017-08-30 SURGERY — ECHOCARDIOGRAM, TRANSESOPHAGEAL
Anesthesia: Monitor Anesthesia Care

## 2017-08-30 MED ORDER — LACTATED RINGERS IV BOLUS (SEPSIS)
500.0000 mL | Freq: Once | INTRAVENOUS | Status: AC
  Administered 2017-08-30: 500 mL via INTRAVENOUS

## 2017-08-30 MED ORDER — FENTANYL CITRATE (PF) 250 MCG/5ML IJ SOLN
INTRAMUSCULAR | Status: AC
  Filled 2017-08-30: qty 5

## 2017-08-30 MED ORDER — FENTANYL CITRATE (PF) 250 MCG/5ML IJ SOLN
INTRAMUSCULAR | Status: DC | PRN
  Administered 2017-08-30 (×2): 50 ug via INTRAVENOUS

## 2017-08-30 MED ORDER — ROCURONIUM BROMIDE 10 MG/ML (PF) SYRINGE
PREFILLED_SYRINGE | INTRAVENOUS | Status: AC
  Filled 2017-08-30: qty 5

## 2017-08-30 MED ORDER — MIDAZOLAM HCL 2 MG/2ML IJ SOLN
INTRAMUSCULAR | Status: AC
  Filled 2017-08-30: qty 2

## 2017-08-30 MED ORDER — MEPERIDINE HCL 25 MG/ML IJ SOLN: 6.2500 mg | INTRAMUSCULAR | Status: DC | PRN

## 2017-08-30 MED ORDER — METOCLOPRAMIDE HCL 5 MG/ML IJ SOLN: 10.0000 mg | Freq: Once | INTRAMUSCULAR | Status: DC | PRN

## 2017-08-30 MED ORDER — LIDOCAINE 2% (20 MG/ML) 5 ML SYRINGE
INTRAMUSCULAR | Status: AC
  Filled 2017-08-30: qty 15

## 2017-08-30 MED ORDER — HEPARIN SODIUM (PORCINE) 1000 UNIT/ML IJ SOLN
INTRAMUSCULAR | Status: AC
  Filled 2017-08-30: qty 2

## 2017-08-30 MED ORDER — ONDANSETRON HCL 4 MG/2ML IJ SOLN
INTRAMUSCULAR | Status: AC
  Filled 2017-08-30: qty 4

## 2017-08-30 MED ORDER — PHENYLEPHRINE 40 MCG/ML (10ML) SYRINGE FOR IV PUSH (FOR BLOOD PRESSURE SUPPORT)
PREFILLED_SYRINGE | INTRAVENOUS | Status: AC
  Filled 2017-08-30: qty 10

## 2017-08-30 MED ORDER — DEXTROSE 5 % IV SOLN
1.5000 g | INTRAVENOUS | Status: AC
  Administered 2017-08-30: 1.5 g via INTRAVENOUS
  Filled 2017-08-30: qty 1.5

## 2017-08-30 MED ORDER — EPHEDRINE 5 MG/ML INJ
INTRAVENOUS | Status: AC
  Filled 2017-08-30: qty 20

## 2017-08-30 MED ORDER — PROPOFOL 10 MG/ML IV BOLUS
INTRAVENOUS | Status: AC
  Filled 2017-08-30: qty 20

## 2017-08-30 MED ORDER — HEPARIN SODIUM (PORCINE) 1000 UNIT/ML IJ SOLN
INTRAMUSCULAR | Status: DC | PRN
  Administered 2017-08-30: 8000 [IU] via INTRAVENOUS

## 2017-08-30 MED ORDER — 0.9 % SODIUM CHLORIDE (POUR BTL) OPTIME
TOPICAL | Status: DC | PRN
  Administered 2017-08-30: 2000 mL

## 2017-08-30 MED ORDER — PHENYLEPHRINE 40 MCG/ML (10ML) SYRINGE FOR IV PUSH (FOR BLOOD PRESSURE SUPPORT)
PREFILLED_SYRINGE | INTRAVENOUS | Status: AC
  Filled 2017-08-30: qty 30

## 2017-08-30 MED ORDER — ONDANSETRON HCL 4 MG/2ML IJ SOLN
INTRAMUSCULAR | Status: DC | PRN
  Administered 2017-08-30: 4 mg via INTRAVENOUS

## 2017-08-30 MED ORDER — DEXAMETHASONE SODIUM PHOSPHATE 10 MG/ML IJ SOLN
INTRAMUSCULAR | Status: DC | PRN
  Administered 2017-08-30: 8 mg via INTRAVENOUS

## 2017-08-30 MED ORDER — SODIUM CHLORIDE 0.9 % IV SOLN
INTRAVENOUS | Status: DC | PRN
  Administered 2017-08-30: 500 mL

## 2017-08-30 MED ORDER — FENTANYL CITRATE (PF) 100 MCG/2ML IJ SOLN
INTRAMUSCULAR | Status: AC
  Filled 2017-08-30: qty 2

## 2017-08-30 MED ORDER — LACTATED RINGERS IV SOLN
INTRAVENOUS | Status: DC
  Administered 2017-08-30 (×2): via INTRAVENOUS

## 2017-08-30 MED ORDER — FENTANYL CITRATE (PF) 100 MCG/2ML IJ SOLN
25.0000 ug | INTRAMUSCULAR | Status: DC | PRN
  Administered 2017-08-30: 50 ug via INTRAVENOUS

## 2017-08-30 MED ORDER — PROPOFOL 10 MG/ML IV BOLUS
INTRAVENOUS | Status: DC | PRN
  Administered 2017-08-30: 170 mg via INTRAVENOUS

## 2017-08-30 MED ORDER — ROCURONIUM BROMIDE 10 MG/ML (PF) SYRINGE
PREFILLED_SYRINGE | INTRAVENOUS | Status: DC | PRN
  Administered 2017-08-30 (×2): 20 mg via INTRAVENOUS
  Administered 2017-08-30: 60 mg via INTRAVENOUS
  Administered 2017-08-30 (×2): 20 mg via INTRAVENOUS
  Administered 2017-08-30: 10 mg via INTRAVENOUS
  Administered 2017-08-30 (×2): 20 mg via INTRAVENOUS

## 2017-08-30 MED ORDER — SUCCINYLCHOLINE CHLORIDE 200 MG/10ML IV SOSY
PREFILLED_SYRINGE | INTRAVENOUS | Status: AC
  Filled 2017-08-30: qty 10

## 2017-08-30 MED ORDER — LIDOCAINE 2% (20 MG/ML) 5 ML SYRINGE
INTRAMUSCULAR | Status: DC | PRN
  Administered 2017-08-30: 100 mg via INTRAVENOUS

## 2017-08-30 MED ORDER — MIDODRINE HCL 5 MG PO TABS
5.0000 mg | ORAL_TABLET | Freq: Three times a day (TID) | ORAL | Status: DC
  Administered 2017-08-30 – 2017-08-31 (×2): 5 mg via ORAL
  Filled 2017-08-30 (×2): qty 1

## 2017-08-30 MED ORDER — EPHEDRINE SULFATE-NACL 50-0.9 MG/10ML-% IV SOSY
PREFILLED_SYRINGE | INTRAVENOUS | Status: DC | PRN
  Administered 2017-08-30: 10 mg via INTRAVENOUS
  Administered 2017-08-30: 200 mg via INTRAVENOUS
  Administered 2017-08-30: 10 mg via INTRAVENOUS

## 2017-08-30 SURGICAL SUPPLY — 60 items
BANDAGE ESMARK 6X9 LF (GAUZE/BANDAGES/DRESSINGS) IMPLANT
BNDG ESMARK 6X9 LF (GAUZE/BANDAGES/DRESSINGS)
CANISTER SUCT 3000ML PPV (MISCELLANEOUS) ×2 IMPLANT
CATH EMB 3FR 80CM (CATHETERS) ×2 IMPLANT
CATH EMB 4FR 80CM (CATHETERS) ×2 IMPLANT
CATH EMB 5FR 80CM (CATHETERS) IMPLANT
CLIP VESOCCLUDE MED 24/CT (CLIP) ×2 IMPLANT
CLIP VESOCCLUDE SM WIDE 24/CT (CLIP) ×2 IMPLANT
COVER SURGICAL LIGHT HANDLE (MISCELLANEOUS) ×2 IMPLANT
CUFF TOURNIQUET SINGLE 24IN (TOURNIQUET CUFF) IMPLANT
CUFF TOURNIQUET SINGLE 34IN LL (TOURNIQUET CUFF) IMPLANT
CUFF TOURNIQUET SINGLE 44IN (TOURNIQUET CUFF) IMPLANT
DECANTER SPIKE VIAL GLASS SM (MISCELLANEOUS) IMPLANT
DERMABOND ADVANCED (GAUZE/BANDAGES/DRESSINGS) ×1
DERMABOND ADVANCED .7 DNX12 (GAUZE/BANDAGES/DRESSINGS) ×1 IMPLANT
DRAIN HEMOVAC 1/8 X 5 (WOUND CARE) ×2 IMPLANT
DRAIN SNY 10X20 3/4 PERF (WOUND CARE) IMPLANT
DRAPE X-RAY CASS 24X20 (DRAPES) IMPLANT
DRSG COVADERM 4X10 (GAUZE/BANDAGES/DRESSINGS) ×4 IMPLANT
ELECT REM PT RETURN 9FT ADLT (ELECTROSURGICAL) ×2
ELECTRODE REM PT RTRN 9FT ADLT (ELECTROSURGICAL) ×1 IMPLANT
EVACUATOR SILICONE 100CC (DRAIN) ×2 IMPLANT
GAUZE SPONGE 2X2 8PLY STRL LF (GAUZE/BANDAGES/DRESSINGS) ×1 IMPLANT
GLOVE BIO SURGEON STRL SZ 6.5 (GLOVE) ×4 IMPLANT
GLOVE BIO SURGEON STRL SZ7.5 (GLOVE) ×2 IMPLANT
GLOVE BIOGEL PI IND STRL 6.5 (GLOVE) ×4 IMPLANT
GLOVE BIOGEL PI IND STRL 9 (GLOVE) ×1 IMPLANT
GLOVE BIOGEL PI INDICATOR 6.5 (GLOVE) ×4
GLOVE BIOGEL PI INDICATOR 9 (GLOVE) ×1
GLOVE ECLIPSE 6.5 STRL STRAW (GLOVE) ×2 IMPLANT
GLOVE SURG SS PI 6.0 STRL IVOR (GLOVE) ×2 IMPLANT
GOWN STRL REUS W/ TWL LRG LVL3 (GOWN DISPOSABLE) ×4 IMPLANT
GOWN STRL REUS W/TWL LRG LVL3 (GOWN DISPOSABLE) ×4
KIT BASIN OR (CUSTOM PROCEDURE TRAY) ×2 IMPLANT
KIT ROOM TURNOVER OR (KITS) ×2 IMPLANT
LOOP VESSEL MINI RED (MISCELLANEOUS) ×2 IMPLANT
NS IRRIG 1000ML POUR BTL (IV SOLUTION) ×4 IMPLANT
PACK PERIPHERAL VASCULAR (CUSTOM PROCEDURE TRAY) ×2 IMPLANT
PAD ARMBOARD 7.5X6 YLW CONV (MISCELLANEOUS) ×4 IMPLANT
PATCH VASC XENOSURE 1CMX6CM (Vascular Products) ×1 IMPLANT
PATCH VASC XENOSURE 1X6 (Vascular Products) ×1 IMPLANT
SET COLLECT BLD 21X3/4 12 (NEEDLE) IMPLANT
SPONGE GAUZE 2X2 STER 10/PKG (GAUZE/BANDAGES/DRESSINGS) ×1
SPONGE LAP 18X18 X RAY DECT (DISPOSABLE) ×2 IMPLANT
SPONGE SURGIFOAM ABS GEL 100 (HEMOSTASIS) IMPLANT
STAPLER VISISTAT 35W (STAPLE) ×2 IMPLANT
STOPCOCK 4 WAY LG BORE MALE ST (IV SETS) IMPLANT
SUT ETHILON 3 0 PS 1 (SUTURE) ×2 IMPLANT
SUT PROLENE 5 0 C 1 24 (SUTURE) IMPLANT
SUT PROLENE 6 0 CC (SUTURE) ×6 IMPLANT
SUT SILK 3 0 (SUTURE) ×1
SUT SILK 3-0 18XBRD TIE 12 (SUTURE) ×1 IMPLANT
SUT VIC AB 2-0 CTX 36 (SUTURE) ×4 IMPLANT
SUT VIC AB 3-0 SH 27 (SUTURE) ×2
SUT VIC AB 3-0 SH 27X BRD (SUTURE) ×2 IMPLANT
SYR 3ML LL SCALE MARK (SYRINGE) ×4 IMPLANT
TRAY FOLEY W/METER SILVER 16FR (SET/KITS/TRAYS/PACK) IMPLANT
TUBING EXTENTION W/L.L. (IV SETS) IMPLANT
UNDERPAD 30X30 (UNDERPADS AND DIAPERS) ×2 IMPLANT
WATER STERILE IRR 1000ML POUR (IV SOLUTION) ×2 IMPLANT

## 2017-08-30 NOTE — Progress Notes (Signed)
PROGRESS NOTE    Jolinda CroakRichard P [name]  ZOX:[id number] DOB: 01/18/1951 DOA: 08/22/2017 PCP: Patient, No Pcp Per   Chief Complaint  Patient presents with  . Altered Mental Status    Brief Narrative:   HPI on 08/22/2017 by Dr. Delano Metzobert Schertz  [name] CreekRichard P [name] is a 66 y.o. male with unknown prior medical history, per ED notes a family member found him on the floor in urine, they went by his home because he hadn't heard from him in two days. They asked the mother who was at the home how long this had been going and she also stated two days. Upon presentation the patient was completely flaccid R side, oriented name only, unable to speak and wouldn't follow commands. 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  We are asked to see for admission.   Patient unable to speak, but does weakly respond to simple questions.   Per pt's step- brother, [name], the patient has never been sick.  Walks every day, doesn't smoke or drink.  [place] up in [place], [place].  Worked as a [employer] for 25 yrs in [place]  Never went to the doctor, never complained about anything.  He retired 5-10 yrs ago.  He doesn't take any medication that the step-brother knows of.   Their mother had a stroke last year and the patient has been looking after her. It can be stressful for the stepbrother.     Interim history  Found to have acute CVA and [facility]. Neurology consulted and following. Patient also found to have sinus bradycardia, cardiology consulted and appreciated. Patient plan for TEE with possible loop recorder on 08/28/2017. Patient noted to have absent pulses in the lower ext B/L, doppler showed total occlusions of arteries. Placed on IV heparin, vascular surgery consulted- s/p fasciotomy.Patient also developed A fib with RVR.  Assessment & Plan   Acute left MCA CVA  - Presented with right-sided Hemiparesis as well as expressive aphasia - imaging showed  large acute left MCA territory infarct,  most confluent along the insula, operular, basal ganglia, with petechial hemorrhage within the left striatum, cytotoxic edema causes 5mm of midlinie shift. MRA head: Left M1 occlusion, advanced right P2 stenosis, Carotid Doppler: Bilateral ICA 1-39% stenosis, vertebral artery flow antegrade, Echocardiogram shows an EF of 55%, no source of emboli. LDL 78, hemoglobin A1c 5.2. Speech therapy working with patient, currently on dysphagia 1 diet, seen by stroke team and full stroke protocol was followed, currently on aspirin, statin along with heparin drip. Source could have been atrial fibrillation. Will require rehabilitation upon discharge.    Peripheral vascular disease/ Bilateral lower extremity Ischemia with compartment syndrome - was found to have  age intermediate obstructive thrombosis involving the common femoral, superficial femoral, popliteal, posterior tibial, peroneal, anterior tibial arteries bilaterally, vascular surgery was consulted patient was placed on heparin drip. CTA AO+BiFEM: Near occlusive thrombus within right common iliac vessel with occlusive thrombus involving the right external iliac, common femoral, superficial femoral, right popliteal vessels. Occlusive thrombus within proximal profunda artery with distal flow present. Near occlusive to occlusive thrombus with the left external iliac artery. Nonocclusive thrombus in the celiac trunk which extends to the left splenic artery. Wedge shaped hypodensity in the spleen suspicious for an infarct.   He is currently on heparin drip, being followed by vascular surgery and status post  Bilateral embolectomy iliac femoral popliteal, 4 compartment fasciotomy bilaterally, s/p closure of B/L  leg fasciotomies on 08/28/2017. 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   New Atrial fibrillation with RVR with likely underlying sick sinus syndrome  [place] vasc 2 score of at least 3 -Cardiology consulted and appreciated, currently bradycardic hence a rate controlling agents are held, currently on heparin drip, may require pacemaker soon.    Hypotension - IV fluid and midodrine, check TSH and cortisol.   Dysuria -UA unremarkable for infection x 2, UA did show large hemoglobin, likely foley related to trauma. This problem has resolved.   Dehydration/hypernatremia, with prerenal Azotemia, mild rhabdomyolysis.  All resolved with hydration.  Hyperlipidemia -started on statin     DVT Prophylaxis   heparin  Code Status: Full  Family Communication: None at bedside  Disposition Plan: Admitted.Continue to monitor in stepdown. Pending TEE/loop on 9/12. Dispo pending   Consultants Neurology Cardiology Vascular surgery Inpatient rehab  Procedures  Echocardiogram Lower extremity doppler Lower ext 4 compartment fasciotomy bilaterally Closure of LE fasciotomies   Antibiotics   Anti-infectives    Start     Dose/Rate Route Frequency Ordered Stop   08/30/17 1600  cefUROXime (ZINACEF) 1.5 g in dextrose 5 % 50 mL IVPB     1.5 g 100 mL/hr over 30 Minutes Intravenous On call to O.R. 08/30/17 0865 08/31/17 0559   08/28/17 0600  cefUROXime (ZINACEF) 1.5 g in dextrose 5 % 50 mL IVPB     1.5 g 100 mL/hr over 30 Minutes Intravenous On call to O.R. 08/27/17 1502 08/29/17 0323   08/27/17 0500  cefUROXime (ZINACEF) 1.5 g in dextrose 5 % 50 mL IVPB     1.5 g 100 mL/hr over 30 Minutes Intravenous Every 12 hours 08/26/17 2132 08/27/17 1753   08/26/17 1600  cefUROXime (ZINACEF) 1.5 g in dextrose 5 % 50 mL IVPB     1.5 g 100 mL/hr over 30 Minutes Intravenous To Surgery 08/26/17 1543 08/27/17 0502   08/22/17 1715  vancomycin (VANCOCIN) IVPB 1000 mg/200 mL premix     1,000 mg 200 mL/hr over 60 Minutes Intravenous  Once 08/22/17 1710 08/22/17 1935   08/22/17 1715  piperacillin-tazobactam (ZOSYN) IVPB 3.375 g     3.375 g 100 mL/hr over 30 Minutes  Intravenous  Once 08/22/17 1710 08/22/17 1838      Subjective:   Patient in bed, appears comfortable, denies any headache, no fever, no chest pain or pressure, no shortness of breath , no abdominal pain. No focal weakness.   Objective:   Vitals:   08/30/17 0258 08/30/17 0710 08/30/17 0800 08/30/17 1000  BP: 100/65 104/70 94/62 107/62  Pulse: 76 (!) 56 (!) 55 (!) 48  Resp: 14 (!) Temp: 98.1 F (36.7 C) 98.6 F (37 C)    TempSrc: Oral Oral    SpO2: 96% 99% 97% 100%  Weight:      Height:        Intake/Output Summary (Last 24 hours) at 08/30/17 1106 Last data filed at 08/30/17 1000  Gross per 24 hour  Intake          3854.39 ml  Output             2625 ml  Net          1229.39 ml   Filed Weights   08/23/17 0210 08/23/17 0500 08/23/17 2200  Weight: 78.5 kg (173 lb 1 oz) 78.5 kg (173 lb 1 oz) 80.9 kg (178 lb 5.6 oz)  Exam  General: Well developed, well nourished, no distress  HEENT: NCAT, Sclera, mucous membranes moist.   Cardiovascular: S1 S2 auscultated, irregularly irregular  Respiratory: Clear to auscultation bilaterally with equal chest rise  Abdomen: Soft, nontender, nondistended, + bowel sounds  Extremities: warm dry without cyanosis clubbing or edema. Dressing in place on LE B/L with staples in place.  Neuro: awake and alert, dense right-sided hemiparesis, does have aphasia  Psych: Apppropriate  Data Reviewed: I have personally reviewed following labs and imaging studies  CBC:  Recent Labs Lab 08/24/17 0102 08/27/17 0411 08/28/17 0207 08/29/17 0300 08/30/17 0321  WBC 10.9* 13.3*  13.4* 12.1* 11.2* 11.1*  HGB 13.5 13.1  12.9* 11.8* 11.2* 9.5*  HCT 41.4 38.4*  38.2* 35.4* 33.3* 28.7*  MCV 93.5 89.5  89.9 90.3 90.7 90.5  PLT 134* 154  148* 145* 143* 160   Basic Metabolic Panel:  Recent Labs Lab 08/25/17 0709 08/27/17 0411 08/28/17 0207 08/29/17 0300 08/30/17 0321  NA 144 139  137 138 134* 135  K 4.6 4.6  4.4 3.9 4.1 4.0    CL 115* 105  103 105 104 106  CO2 GLUCOSE 86 147*  146* 118* 102* 99  BUN 22* CREATININE 1.07 1.18  1.19 1.12 1.12 1.06  CALCIUM 8.4* 8.3*  8.2* 8.2* 8.1* 8.0*   GFR: Estimated Creatinine Clearance: 76.4 mL/min (by C-G formula based on SCr of 1.06 mg/dL). Liver Function Tests: No results for input(s): AST, ALT, ALKPHOS, BILITOT, PROT, ALBUMIN in the last 168 hours. No results for input(s): LIPASE, AMYLASE in the last 168 hours. No results for input(s): AMMONIA in the last 168 hours. Coagulation Profile: No results for input(s): INR, PROTIME in the last 168 hours. Cardiac Enzymes:  Recent Labs Lab 08/27/17 0411  CKTOTAL 175   BNP (last 3 results) No results for input(s): PROBNP in the last 8760 hours. HbA1C: No results for input(s): HGBA1C in the last 72 hours. CBG:  Recent Labs Lab 08/23/17 2249 08/24/17 0353 08/26/17 2105 08/27/17 0839 08/28/17 1144  GLUCAP 86 79 92 126* 97   Lipid Profile: No results for input(s): CHOL, HDL, LDLCALC, TRIG, CHOLHDL, LDLDIRECT in the last 72 hours. Thyroid Function Tests:  Recent Labs  08/30/17 0819  TSH 1.627   Anemia Panel: No results for input(s): VITAMINB12, FOLATE, FERRITIN, TIBC, IRON, RETICCTPCT in the last 72 hours. Urine analysis:    Component Value Date/Time   COLORURINE YELLOW 08/25/2017 1026   APPEARANCEUR HAZY (A) 08/25/2017 1026   LABSPEC 1.016 08/25/2017 1026   PHURINE 5.0 08/25/2017 1026   GLUCOSEU NEGATIVE 08/25/2017 1026   HGBUR LARGE (A) 08/25/2017 1026   BILIRUBINUR NEGATIVE 08/25/2017 1026   KETONESUR NEGATIVE 08/25/2017 1026   PROTEINUR 30 (A) 08/25/2017 1026   NITRITE NEGATIVE 08/25/2017 1026   LEUKOCYTESUR MODERATE (A) 08/25/2017 1026   Sepsis Labs: (procalcitonin:4,lacticidven:4)  ) Recent Results (from the past 240 hour(s))  Blood Culture (routine x 2)     Status: None   Collection Time: 08/22/17  4:40 PM  Result Value Ref Range  Status   Specimen Description RIGHT ANTECUBITAL  Final   Special Requests   Final    BOTTLES DRAWN AEROBIC AND ANAEROBIC Blood Culture adequate volume   Culture NO GROWTH 5 DAYS  Final   Report Status 08/27/2017 FINAL  Final  Blood Culture (routine x 2)     Status: None   Collection Time: 08/22/17  4:44 PM  Result Value Ref Range Status   Specimen Description BLOOD LEFT HAND  Final   Special Requests   Final    BOTTLES DRAWN AEROBIC AND ANAEROBIC Blood Culture adequate volume   Culture NO GROWTH 5 DAYS  Final   Report Status 08/27/2017 FINAL  Final  MRSA PCR Screening     Status: None   Collection Time: 08/23/17  1:59 AM  Result Value Ref Range Status   MRSA by PCR NEGATIVE NEGATIVE Final    Comment:        The GeneXpert MRSA Assay (FDA approved for NASAL specimens only), is one component of a comprehensive MRSA colonization surveillance program. It is not intended to diagnose MRSA infection nor to guide or monitor treatment for MRSA infections.   Urine Culture     Status: None   Collection Time: 08/25/17 10:26 AM  Result Value Ref Range Status   Specimen Description URINE, CLEAN CATCH  Final   Special Requests NONE  Final   Culture NO GROWTH  Final   Report Status 08/26/2017 FINAL  Final      Radiology Studies: No results found.   Scheduled Meds: . aspirin  325 mg Oral Daily  . atorvastatin  20 mg Oral q1800  . chlorhexidine  15 mL Mouth Rinse BID  . docusate sodium  100 mg Oral Daily  . mouth rinse  15 mL Mouth Rinse q12n4p  . midodrine  5 mg Oral TID WC  . pantoprazole  40 mg Oral Daily   Continuous Infusions: . sodium chloride 100 mL/hr at 08/30/17 0300  . amiodarone Stopped (08/30/17 0730)  . cefUROXime (ZINACEF)  IV    . dextrose 5 % and 0.45% NaCl Stopped (08/27/17 1601)  . heparin 1,600 Units/hr (08/30/17 0554)  . lactated ringers Stopped (08/28/17 1937)  . magnesium sulfate 1 - 4 g bolus IVPB       LOS: 8 days   Time Spent in minutes   35  minutes Signature  Susa Raring M.D on 08/30/2017 at 11:06 AM  Between 7am to 7pm - Pager - [phone] ( page via [email or web address], text pages only, please mention full 10 digit call back number).  After 7pm go to [email or web address] - password [facility]

## 2017-08-30 NOTE — Care Management Important Message (Signed)
Important Message  Patient Details  Name: [name] CreekRichard P Mclester MRN: [id number] Date of Birth: 05/20/1951   Medicare Important Message Given:  Yes    [name] 08/30/2017, 10:25 AM

## 2017-08-30 NOTE — Progress Notes (Signed)
CSW placed list of SNF bed offers at bedside for patient brother to review when he comes to hospital this evening.  CSW will continue to follow.  Burna SisJenna H. Wyona Neils, LCSW Clinical Social Worker [phone]

## 2017-08-30 NOTE — Clinical Social Work Note (Signed)
Clinical Social Work Assessment  Patient Details  Name: [name] [name] MRN: [id number] Date of Birth: 30-Aug-1951  Date of referral:  08/30/17               Reason for consult:  Facility Placement                Permission sought to share information with:  Chartered certified accountant granted to share information::  Yes, Verbal Permission Granted  Name::     [name] [name]  Agency::  SNF  Relationship::  brother  Contact Information:     Housing/Transportation Living arrangements for the past 2 months:  [place] of Information:  Other (Comment Required) (siblings) Patient Interpreter Needed:  None Criminal Activity/Legal Involvement Pertinent to Current Situation/Hospitalization:  No - Comment as needed Significant Relationships:  Siblings, Parents Lives with:  Parents Do you feel safe going back to the place where you live?  No Need for family participation in patient care:  Yes (Comment) (help with decision making at this time)  Care giving concerns:  Pt lives at home with mom who he is the caregiver for.  Patient was down for 2 days at home before family discovered him- pt mom not competent enough to call family or ambulance for assistance.  Patient now with increased impairment after stroke and unable to move independently.   Social Worker assessment / plan:  CSW spoke with pt brother concerning PT recommendation for SNF at this time.  Explained SNF and SNF referral process.  CSW met patient as well and he nodded understanding but unable to speak at this time.  Employment status:  Retired Forensic scientist:  Medicare PT Recommendations:  [place] / Referral to community resources:  [place]  Patient/Family's Response to care:  Pt brother acknowledges need for patient to go to rehab program when stable for DC.   Patient/Family's Understanding of and Emotional Response to Diagnosis, Current  Treatment, and Prognosis:  Family understands patient condition at this time due to extensive family history of stroke- hopeful that he can improve enough with family to return home in near future.  Emotional Assessment Appearance:  Appears stated age Attitude/Demeanor/Rapport:    Affect (typically observed):    Orientation:  Oriented to Self, Oriented to Place, Oriented to  Time Alcohol / Substance use:  Not Applicable Psych involvement (Current and /or in the community):  No (Comment)  Discharge Needs  Concerns to be addressed:  Care Coordination Readmission within the last 30 days:  No Current discharge risk:  Physical Impairment Barriers to Discharge:  Continued Medical Work up   Jorge Ny, LCSW 08/30/2017, 11:12 AM

## 2017-08-30 NOTE — Progress Notes (Signed)
ANTICOAGULATION CONSULT NOTE - Follow Up Consult  Pharmacy Consult for heparin Indication: occluded LE arteries  Labs:  Recent Labs  08/28/17 0207  08/28/17 1955 08/29/17 0300 08/30/17 0321  HGB 11.8*  --   --  11.2* 9.5*  HCT 35.4*  --   --  33.3* 28.7*  PLT 145*  --   --  143* 160  HEPARINUNFRC 0.20*  < > 0.23* 0.34 0.19*  CREATININE 1.12  --   --  1.12 1.06  < > = values in this interval not displayed.   Assessment: 66yo male now subtherapeutic on heparin after one level at goal; Hgb down but RN notes no overt signs of bleeding.  Goal of Therapy:  Heparin level 0.3-0.5 units/ml   Plan:  Will increase heparin gtt by 2 units/kg/hr to 1600 units/hr and check level in 6hr.  Vernard GamblesVeronda Sitlaly Gudiel, PharmD, BCPS  08/30/2017,5:57 AM

## 2017-08-30 NOTE — Anesthesia Procedure Notes (Signed)
Procedure Name: Intubation Date/Time: 08/30/2017 2:47 PM Performed by: Myna Bright Pre-anesthesia Checklist: Patient identified, Emergency Drugs available, Suction available and Patient being monitored Patient Re-evaluated:Patient Re-evaluated prior to induction Oxygen Delivery Method: Circle system utilized Preoxygenation: Pre-oxygenation with 100% oxygen Induction Type: IV induction Ventilation: Mask ventilation without difficulty and Oral airway inserted - appropriate to patient size Laryngoscope Size: Mac and 4 Grade View: Grade II Tube type: Oral Tube size: 7.5 mm Number of attempts: 1 Airway Equipment and Method: Stylet Placement Confirmation: ETT inserted through vocal cords under direct vision,  positive ETCO2 and breath sounds checked- equal and bilateral Secured at: 23 cm Tube secured with: Tape Dental Injury: Teeth and Oropharynx as per pre-operative assessment

## 2017-08-30 NOTE — Progress Notes (Signed)
Pt still with extensive popliteal tibial SFA embolus.  Has flow to feet with PT doppler and no real symptoms but this will be a problem long term if not addressed.  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  He understands and agrees to proceed but due to recent stroke will also inform pt brother at his request.  Keep heparin going Keep NPO To OR later today  Fabienne Brunsharles Irva Loser, MD [facility] Office: [phone] Pager: [phone]

## 2017-08-30 NOTE — Progress Notes (Signed)
Patient was scheduled today for a 1200 TEE. There was no consent available when endoscopy came to pick him up. Dr. Eden EmmsNishan notified and he decided to cancel the patient for today. The floor RN called me back around 1200 saying she was able to get consent but this now conflicts with patient's surgery in the OR. Endo RN called the OR and per Dr. Darrick PennaFields the TEE can wait.

## 2017-08-30 NOTE — Anesthesia Preprocedure Evaluation (Signed)
Anesthesia Evaluation  Patient identified by MRN, date of birth, ID band Patient awake    Reviewed: Allergy & Precautions, NPO status , Patient's Chart, lab work & pertinent test results  Airway Mallampati: II  TM Distance: >3 FB Neck ROM: Full    Dental no notable dental hx.    Pulmonary neg pulmonary ROS,    Pulmonary exam normal breath sounds clear to auscultation       Cardiovascular Normal cardiovascular exam+ dysrhythmias Atrial Fibrillation  Rhythm:Regular Rate:Normal     Neuro/Psych CVA (R sided weakness, aphasia), Residual Symptoms negative neurological ROS  negative psych ROS   GI/Hepatic negative GI ROS, Neg liver ROS,   Endo/Other  negative endocrine ROS  Renal/GU negative Renal ROS  negative genitourinary   Musculoskeletal negative musculoskeletal ROS (+)   Abdominal   Peds negative pediatric ROS (+)  Hematology negative hematology ROS (+)   Anesthesia Other Findings   Reproductive/Obstetrics negative OB ROS                             Anesthesia Physical Anesthesia Plan  ASA: III  Anesthesia Plan: General   Post-op Pain Management:    Induction: Intravenous  PONV Risk Score and Plan: 2 and Ondansetron and Dexamethasone  Airway Management Planned: Oral ETT  Additional Equipment:   Intra-op Plan:   Post-operative Plan: Extubation in OR  Informed Consent: I have reviewed the patients History and Physical, chart, labs and discussed the procedure including the risks, benefits and alternatives for the proposed anesthesia with the patient or authorized representative who has indicated his/her understanding and acceptance.   Dental advisory given  Plan Discussed with: CRNA  Anesthesia Plan Comments:         Anesthesia Quick Evaluation

## 2017-08-30 NOTE — Progress Notes (Signed)
ANTICOAGULATION CONSULT NOTE - Follow Up Consult  Pharmacy Consult for Heparin Indication: Occludeded arteries LE B/L  No Known Allergies  Patient Measurements: Height: 6\' 1"  (185.4 cm) Weight: 178 lb (80.7 kg) IBW/kg (Calculated) : 79.9 Heparin Dosing Weight: 80.9 kg  Vital Signs: Temp: 98 F (36.7 C) (09/12 1135) Temp Source: Oral (09/12 1135) BP: 100/59 (09/12 1135) Pulse Rate: 49 (09/12 1135)  Labs:  Recent Labs  08/28/17 0207  08/29/17 0300 08/30/17 0321 08/30/17 1234  HGB 11.8*  --  11.2* 9.5*  --   HCT 35.4*  --  33.3* 28.7*  --   PLT 145*  --  143* 160  --   HEPARINUNFRC 0.20*  < > 0.34 0.19* 0.39  CREATININE 1.12  --  1.12 1.06  --   < > = values in this interval not displayed.  Estimated Creatinine Clearance: 76.4 mL/min (by C-G formula based on SCr of 1.06 mg/dL).   Assessment: [name] who was found down with AMS on 9/4 and brought into the [facility]. Work-up on admission revealed a new acute L-MCA CVA. Dopplers done 9/8 show occluded B/L LE arteries and pharmacy was consulted to start heparin for anticoagulation and to hold boluses in the setting of a recent CVA.   The patient was noted to be taken for emergent embolectomies/fasciotomies 9/8 with heparin resumed post-op. He continues on heparin and his current heparin level is therapeutic. His CBC is stable. Per vascular surgery he is going for additional surgery today.  Goal of Therapy:  Heparin level 0.3-0.5 units/ml Monitor platelets by anticoagulation protocol: Yes   Plan:  Continue heparin at 1600 units / hr Follow up after surgery for anticoagulation plans  Sallee Provencalurner, Euell Schiff S 08/30/2017 2:26 PM

## 2017-08-30 NOTE — Progress Notes (Signed)
Progress Note  Patient Name: [name] CroakRichard P Kaczorowski Date of Encounter: 08/30/2017  Primary Cardiologist: new patient  Subjective   The patient is aphasic but denies chest pain, he is up in the chair today.   Inpatient Medications    Scheduled Meds: . aspirin  325 mg Oral Daily  . atorvastatin  20 mg Oral q1800  . chlorhexidine  15 mL Mouth Rinse BID  . docusate sodium  100 mg Oral Daily  . mouth rinse  15 mL Mouth Rinse q12n4p  . midodrine  5 mg Oral TID WC  . pantoprazole  40 mg Oral Daily   Continuous Infusions: . sodium chloride 100 mL/hr at 08/30/17 0300  . amiodarone Stopped (08/30/17 0730)  . cefUROXime (ZINACEF)  IV    . dextrose 5 % and 0.45% NaCl Stopped (08/27/17 1601)  . heparin 1,600 Units/hr (08/30/17 0554)  . lactated ringers Stopped (08/30/17 1024)  . lactated ringers Stopped (08/28/17 1937)  . magnesium sulfate 1 - 4 g bolus IVPB     PRN Meds: acetaminophen **OR** acetaminophen, acetaminophen, alum & mag hydroxide-simeth, guaiFENesin-dextromethorphan, hydrALAZINE, labetalol, magnesium sulfate 1 - 4 g bolus IVPB, metoprolol tartrate, ondansetron, phenol, potassium chloride, RESOURCE THICKENUP CLEAR, senna-docusate   Vital Signs    Vitals:   08/29/17 2303 08/30/17 0258 08/30/17 0710 08/30/17 0800  BP: 96/67 100/65 104/70 94/62  Pulse: 77 76 (!) 56 (!) 55  Resp: (!) 21 14 (!) 6 19  Temp: 98.1 F (36.7 C) 98.1 F (36.7 C) 98.6 F (37 C)   TempSrc: Oral Oral Oral   SpO2: 98% 96% 99% 97%  Weight:      Height:        Intake/Output Summary (Last 24 hours) at 08/30/17 1011 Last data filed at 08/30/17 0959  Gross per 24 hour  Intake          3365.34 ml  Output             2625 ml  Net           740.34 ml   Filed Weights   08/23/17 0210 08/23/17 0500 08/23/17 2200  Weight: 173 lb 1 oz (78.5 kg) 173 lb 1 oz (78.5 kg) 178 lb 5.6 oz (80.9 kg)   Telemetry    A-fib with RVR - Personally Reviewed  Physical Exam  GEN: No acute distress.   Neck: No  JVD Cardiac: RRR, no murmurs, rubs, or gallops.  Respiratory: Clear to auscultation bilaterally. GI: Soft, nontender, non-distended  MS: No edema; No deformity. Neuro:  Nonfocal  Psych: Normal affect   Labs    Chemistry  Recent Labs Lab 08/28/17 0207 08/29/17 0300 08/30/17 0321  NA 138 134* 135  K 3.9 4.1 4.0  CL 105 104 106  CO2 26 25 25   GLUCOSE 118* 102* 99  BUN 18 17 17   CREATININE 1.12 1.12 1.06  CALCIUM 8.2* 8.1* 8.0*  GFRNONAA >60 >60 >60  GFRAA >60 >60 >60  ANIONGAP 7 5 4*     Hematology  Recent Labs Lab 08/28/17 0207 08/29/17 0300 08/30/17 0321  WBC 12.1* 11.2* 11.1*  RBC 3.92* 3.67* 3.17*  HGB 11.8* 11.2* 9.5*  HCT 35.4* 33.3* 28.7*  MCV 90.3 90.7 90.5  MCH 30.1 30.5 30.0  MCHC 33.3 33.6 33.1  RDW 12.9 13.0 13.1  PLT 145* 143* 160   Cardiac Enzymes No results for input(s): TROPONINI in the last 168 hours.  No results for input(s): TROPIPOC in the last 168 hours.  BNPNo  results for input(s): BNP, PROBNP in the last 168 hours.   DDimer No results for input(s): DDIMER in the last 168 hours.   Radiology    No results found.  Cardiac Studies   TTE: 09/02/17 - Left ventricle: The cavity size was normal. Wall thickness was   normal. Systolic function was normal. The estimated ejection   fraction was 55%. Wall motion was normal; there were no regional   wall motion abnormalities. Left ventricular diastolic function   parameters were normal. - Aortic valve: Trileaflet; mildly thickened leaflets. There was   trivial regurgitation. - Aorta: Mild aortic root dilatation. Aortic root dimension: 39 mm   (ED). - Mitral valve: There was mild regurgitation. - Left atrium: The atrium was mildly dilated. - Atrial septum: No defect or patent foramen ovale was identified. - Pulmonic valve: There was mild regurgitation.    Patient Profile     66 y.o. male   Assessment & Plan    1. SSS - with profound bradycardia and episodes of a-fib with RVR and  hypotension, he cardioverted to SR the last night, now bradycardic and hypotensive, amiodarone on hold. We will follow, he might eventually need a pacemaker. 2.  Peripheral vascular disease with bilateral embolectomies, extensive popliteal tibial SFA embolus.  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   3.  Recent large left brain stroke 4. He appears euvolemic  For questions or updates, please contact [facility] Please consult [email or web address] for contact info under Cardiology/STEMI. Daytime calls, contact the Day Call APP (6a-8a) or assigned team (Teams A-D) provider (7:30a - 5p). All other daytime calls (7:30-5p), contact the Card Master @ [phone].   Nighttime calls, contact the assigned APP (5p-8p) or MD (6:30p-8p). Overnight calls (8p-6a), contact the on call Fellow @ [phone].   Signed, Tobias Alexander, MD  08/30/2017, 10:11 AM

## 2017-08-30 NOTE — Progress Notes (Signed)
Physical Therapy Treatment Patient Details Name: [name] [name] MRN: [id number] DOB: 1951-10-09 Today's Date: 08/30/2017    History of Present Illness Patient is a 66 y/o male who was found down at home covered in urine. 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 No PMH as pt has not been to the doctor. Noted bilateral leg ischemia on 9/8; to OR for Bilateral embolectomy iliac femoral popliteal, 4 compartment fasciotomy bilaterally that evening, with closure of fasciotomies 08/28/17.    PT Comments    Continuing work on functional mobility and activity tolerance;  Able to stand more today with support form RW (assist to stabilize R hand on RW); 2 person assist to safely pivot to recliner; noted family need to opt for SNF for post- acute rehab  Follow Up Recommendations  SNF     Equipment Recommendations       Recommendations for Other Services       Precautions / Restrictions Precautions Precautions: Fall Precaution Comments: RUE hemiparesis, right inattention, aphasia    Mobility  Bed Mobility Overal bed mobility: Needs Assistance Bed Mobility: Supine to Sit     Supine to sit: Mod assist     General bed mobility comments: Cues for sequencing and hand placement. Assist to raise trunk scoot hips with use of bed pad and for balance initially in sitting  Transfers Overall transfer level: Needs assistance Equipment used: Rolling walker (2 wheeled) Transfers: Sit to/from Stand Sit to Stand: +2 physical assistance;Mod assist Stand pivot transfers: Mod assist;Max assist       General transfer comment: Assist to rise, weight sift anteriorly and to L; Physical assist to help R hand on RW; stood approx 3-4 minutes, and worked on weight shifts laterally, pre-gait; initially mod assist with initiation of stand pivot to chair, then requiring Max assist for safety and control of descent to chair  Ambulation/Gait                 Stairs            Wheelchair Mobility    Modified Rankin (Stroke Patients Only) Modified Rankin (Stroke Patients Only) Pre-Morbid Rankin Score: No symptoms Modified Rankin: Severe disability     Balance Overall balance assessment: Needs assistance Sitting-balance support: Feet supported;No upper extremity supported Sitting balance-Leahy Scale: Fair       Standing balance-Leahy Scale: Poor Standing balance comment: no knee buckling, requires +2 mod assist for weight shift toward L and upright posture, stood x 3 minutes. with cues and assist for trunk extension, head up and safety                            Cognition Arousal/Alertness: Awake/alert Behavior During Therapy: Flat affect Overall Cognitive Status: Difficult to assess Area of Impairment: Following commands;Problem solving                   Current Attention Level: Sustained   Following Commands: Follows multi-step commands consistently     Problem Solving: Slow processing;Decreased initiation;Difficulty sequencing;Requires verbal cues;Requires tactile cues General Comments: pt continues to have unreliable yes/no response, pt remains non verbal      Exercises      General Comments General comments (skin integrity, edema, etc.): VSS      Pertinent Vitals/Pain Faces Pain Scale: No hurt    Home Living  Prior Function            PT Goals (current goals can now be found in the care plan section) Acute Rehab PT Goals Patient Stated Goal: none stated but most likely to return to PLOF PT Goal Formulation: Patient unable to participate in goal setting Time For Goal Achievement: 09/08/17 Potential to Achieve Goals: Fair Progress towards PT goals: Progressing toward goals    Frequency    Min 4X/week      PT Plan Discharge plan needs to be updated;Other (comment) (Noted family need to opt for SNF)    Co-evaluation              AM-PAC PT "6 Clicks" Daily  Activity  Outcome Measure  Difficulty turning over in bed (including adjusting bedclothes, sheets and blankets)?: A Lot Difficulty moving from lying on back to sitting on the side of the bed? : Unable Difficulty sitting down on and standing up from a chair with arms (e.g., wheelchair, bedside commode, etc,.)?: Unable Help needed moving to and from a bed to chair (including a wheelchair)?: A Lot Help needed walking in hospital room?: Total Help needed climbing 3-5 steps with a railing? : Total 6 Click Score: 8    End of Session Equipment Utilized During Treatment: Gait belt Activity Tolerance: Patient tolerated treatment well Patient left: in chair;with call bell/phone within reach;with chair alarm set Nurse Communication: Mobility status;Need for lift equipment [place] Dec(Sara stedy helpful) PT Visit Diagnosis: Hemiplegia and hemiparesis;Unsteadiness on feet (R26.81);Difficulty in walking, not elsewhere classified (R26.2) Hemiplegia - Right/Left: Right Hemiplegia - dominant/non-dominant: Dominant Hemiplegia - caused by: Cerebral infarction     Time: [id number] PT Time Calculation (min) (ACUTE ONLY): 20 min  Charges:  $Therapeutic Activity: 8-22 mins                    G Codes:       [name] ClinesHolly Aalivia [name], PT  Acute Rehabilitation Services Pager [phone] Office [phone]    [name] AlandHolly H Rani [name] 08/30/2017, 11:14 AM

## 2017-08-30 NOTE — Transfer of Care (Signed)
Immediate Anesthesia Transfer of Care Note  Patient: [name] [name]  Procedure(s) Performed: Procedure(s): Right popliteal tibial endarectomy with patch angioplasty (Right)  Patient Location: PACU  Anesthesia Type:General  Level of Consciousness: awake, patient cooperative and responds to stimulation  Airway & Oxygen Therapy: Patient Spontanous Breathing and Patient connected to nasal cannula oxygen  Post-op Assessment: Report given to RN and Post -op Vital signs reviewed and stable  Post vital signs: Reviewed and stable  Last Vitals:  Vitals:   08/30/17 1000 08/30/17 1135  BP: 107/62 (!) 100/59  Pulse: (!) 48 (!) 49  Resp: 20 19  Temp:  36.7 C  SpO2: 100% 100%    Last Pain:  Vitals:   08/30/17 1135  TempSrc: Oral  PainSc:       Patients Stated Pain Goal: 2 (08/27/17 0238)  Complications: No apparent anesthesia complications

## 2017-08-30 NOTE — Op Note (Addendum)
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  Specimen: plaque right popliteal artery  Assistant: Gretta Began MD, Doreatha Massed PA-C  Operative findings: #1 Chronic atherosclerotic plaque popliteal artery with chronic occlusion of popliteal artery  #2 bovine pericardial patch extending from distal popliteal artery to the proximal posterior tibial artery  Operative details: After obtaining informed consent, the patient was taken to the operating room. The patient was placed in supine position on the operating room table. After induction of general anesthesia and endotracheal intubation, the patient's entire right lower extremity was prepped and draped in the usual sterile fashion. A longitudinal incision was reopened on the medial aspect of the right leg below the knee. The incision was carried into the subcutaneous tissues and the greater saphenous vein was identified. Several small side branches were ligated and divided between silk ties and the vein was reflected antteriorly. The incision was then deepened down the popliteal space by opening the fascia for the full length incision. The popliteal vein and artery were identified. The popliteal artery was dissected free circumferentially. It had no pulse.  Several centimeters of the soleus muscle was taken down with cautery to expose the lower portion of the popliteal artery and takeoff of the tibial vessels. Several side branches of the popliteal vein were ligated and divided between silk ties to provide exposure. The anterior tibial artery posterior tibial artery and peroneal arteries were all dissected free circumferentially and vessel loops placed around these. The popliteal artery was dissected free  circumferentially and a vessel loop was placed around this as well. The patient was given 8000 units of intravenous heparin. After appropriate circulation time, the artery was controlled proximally with a vessel loop and distally with vessel loops. A longitudinal opening was made in the distal below-knee popliteal artery.  There was essentially no lumen and there was chronic atherosclerotic plaque obliterating the lumen of the artery.  The artery was opened longitudinally and extended down to the takeoff of the tibial vessels.  I extended the arteriotomy past the takeoff of the peroneal artery down onto the proximal posterior tibial artery so that we would have good exposure of this and also to clean out all atherotic sclerotic debris.  An endarterectomy was performed of the entire below knee popliteal artery and the origins of all 3 tibial vessels.  Good endpoints were obtained.  I attempted to pass a Fogarty up the proximal popliteal artery but this stopped at about 5 cm.  There was a trickle of popliteal artery inflow but almost pulsatile backbleeding from the distal tibial vessels. After this was all then removed under direct vision,  #3 Fogarty catheters were passed down each of the tibial vessels. With no return of embolus.   The anterior tibial and posterior tibial and peroneal arteries were catheterized and multiple passes made until 2 clean passes were obtained. These were thoroughly irrigated with heparinized saline. A bovine pericardial patch was brought in the operative field and sewn on as a patch angioplasty using running 6-0 Prolene suture. Prior to completion of the anastomosis everything was forebled backbled and thoroughly flushed.  The anastomosis was secured, Vesseloops released, and there was good doppler flow in the proximal portion of each tibial vessel immediately. There was also good Doppler flow at the PT area in the foot. Hemostasis was obtained.  A 10 JP was brought out through a separate  stab incisions and sutured to the skin.  The fascia was closed with a running 3-0 Vicryl suture. Subcutaneous tissues were reapproximated using 3-0 Vicryl suture. The skin was closed with staples.  A dry sterile dressing was placed over this. The left foot had good posterior doppler signal and presumably his left popliteal occlusion is also chronic so I did not reexplore this.  The patient tolerated the procedure well and there were no complications. Instrument, sponge, and needle counts were correct at the end of the case. The patient was taken to the recovery room in stable condition.  Fabienne Brunsharles Ronya Gilcrest, MD [facility] Office: [phone] Pager: [phone]

## 2017-08-31 ENCOUNTER — Encounter (HOSPITAL_COMMUNITY): Payer: Self-pay | Admitting: Vascular Surgery

## 2017-08-31 LAB — CBC
HCT: 28.6 % — ABNORMAL LOW (ref 39.0–52.0)
Hemoglobin: 9.4 g/dL — ABNORMAL LOW (ref 13.0–17.0)
MCH: 29.7 pg (ref 26.0–34.0)
MCHC: 32.9 g/dL (ref 30.0–36.0)
MCV: 90.5 fL (ref 78.0–100.0)
Platelets: 184 10*3/uL (ref 150–400)
RBC: 3.16 MIL/uL — ABNORMAL LOW (ref 4.22–5.81)
RDW: 13.1 % (ref 11.5–15.5)
WBC: 12.1 10*3/uL — ABNORMAL HIGH (ref 4.0–10.5)

## 2017-08-31 LAB — HEPARIN LEVEL (UNFRACTIONATED): Heparin Unfractionated: 0.86 IU/mL — ABNORMAL HIGH (ref 0.30–0.70)

## 2017-08-31 MED ORDER — MIDODRINE HCL 5 MG PO TABS
10.0000 mg | ORAL_TABLET | Freq: Three times a day (TID) | ORAL | Status: DC
  Administered 2017-08-31 – 2017-09-05 (×15): 10 mg via ORAL
  Filled 2017-08-31 (×15): qty 2

## 2017-08-31 MED ORDER — APIXABAN 5 MG PO TABS
5.0000 mg | ORAL_TABLET | Freq: Two times a day (BID) | ORAL | Status: DC
  Administered 2017-08-31 – 2017-09-05 (×11): 5 mg via ORAL
  Filled 2017-08-31 (×11): qty 1

## 2017-08-31 MED ORDER — MIDODRINE HCL 5 MG PO TABS: 10.0000 mg | ORAL_TABLET | Freq: Three times a day (TID) | ORAL | Status: DC

## 2017-08-31 MED ORDER — SODIUM CHLORIDE 0.9 % IV SOLN: INTRAVENOUS | Status: DC

## 2017-08-31 NOTE — Progress Notes (Signed)
Occupational Therapy Treatment Patient Details Name: [name] CreekRichard P [name] MRN: [id number] DOB: 08/25/1951 Today's Date: 08/31/2017    History of present illness Patient is a 66 y/o male who was found down at home covered in urine. 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 No PMH as pt has not been to the doctor. Noted bilateral leg ischemia on 9/8; to OR for Bilateral embolectomy iliac femoral popliteal, 4 compartment fasciotomy bilaterally that evening, with closure of fasciotomies 08/28/17.   OT comments  Pt making progress towards OT goals this session, Pt continues to be non-verbal but follows commands throughout the session. RUE remains with sensation intact (per pt report) and flaccid, continuing to impact ADL. Pt was able to write a message to OT today using LUE and whiteboard. Pt discharge updated to SNF, Pt continues to benefit from skilled OT in the acute setting.   Follow Up Recommendations  SNF;Supervision/Assistance - 24 hour    Equipment Recommendations  Other (comment) (defer to next venue)    Recommendations for Other Services      Precautions / Restrictions Precautions Precautions: Fall Precaution Comments: RUE hemiparesis, right inattention, aphasia Restrictions Weight Bearing Restrictions: No       Mobility Bed Mobility Overal bed mobility: Needs Assistance Bed Mobility: Supine to Sit     Supine to sit: Mod assist     General bed mobility comments: Moderate assist to elevate trunk to upright and rotate hips to EOB  Transfers Overall transfer level: Needs assistance Equipment used: Rolling walker (2 wheeled) Transfers: Sit to/from [employer]Stand;Squat Pivot Transfers Sit to Stand: Mod assist;+2 physical assistance   Squat pivot transfers: Mod assist     General transfer comment: Moderate assist to elevate to standing using 3 musketeer technique. Patient bale to push through RUE. VCs for quad setting RLE and manual assit to block RLE from  buckling. Patient able to perform squat pivot to chair with moderate assist in 3 parts with tactile cues for hand placement    Balance Overall balance assessment: Needs assistance Sitting-balance support: Feet supported;No upper extremity supported Sitting balance-Leahy Scale: Fair Sitting balance - Comments: Pt sat EOB with progression of min to minguard assist with reaching to edge of bed for stability     Standing balance-Leahy Scale: Poor Standing balance comment: RLE knee buckling during weight shifts                           ADL either performed or assessed with clinical judgement   ADL Overall ADL's : Needs assistance/impaired     Grooming: Wash/dry hands;Wash/dry face;Set up;Sitting                   Toilet Transfer: Moderate assistance;+2 for physical assistance;Squat-pivot Toilet Transfer Details (indicate cue type and reason): transfer to drop arm recliner, vc for sequencing and reaching with LUE                 Vision       Perception     Praxis      Cognition Arousal/Alertness: Awake/alert Behavior During Therapy: Flat affect Overall Cognitive Status: Difficult to assess Area of Impairment: Following commands;Problem solving                   Current Attention Level: Sustained   Following Commands: Follows multi-step commands consistently     Problem Solving: Slow processing;Decreased initiation;Difficulty sequencing;Requires verbal cues;Requires tactile cues General Comments: pt continues to  have unreliable yes/no response, pt remains non verbal        Exercises     Shoulder Instructions       General Comments Dynamic standin balance exercise performed as precursor for ADL.     Pertinent Vitals/ Pain       Pain Assessment: Faces Faces Pain Scale: No hurt  Home Living                                          Prior Functioning/Environment              Frequency  Min 2X/week         Progress Toward Goals  OT Goals(current goals can now be found in the care plan section)  Progress towards OT goals: Progressing toward goals  Acute Rehab OT Goals Patient Stated Goal: none stated but most likely to return to PLOF OT Goal Formulation: With patient Time For Goal Achievement: 09/08/17 Potential to Achieve Goals: Good  Plan Discharge plan needs to be updated;Frequency needs to be updated    Co-evaluation    PT/OT/SLP Co-Evaluation/Treatment: Yes Reason for Co-Treatment: Complexity of the patient's impairments (multi-system involvement);Necessary to address cognition/behavior during functional activity;To address functional/ADL transfers   OT goals addressed during session: ADL's and self-care      AM-PAC PT "6 Clicks" Daily Activity     Outcome Measure   Help from another person eating meals?: A Little Help from another person taking care of personal grooming?: A Little Help from another person toileting, which includes using toliet, bedpan, or urinal?: A Lot Help from another person bathing (including washing, rinsing, drying)?: A Lot Help from another person to put on and taking off regular upper body clothing?: A Little Help from another person to put on and taking off regular lower body clothing?: A Little 6 Click Score: 16    End of Session Equipment Utilized During Treatment: Gait belt  OT Visit Diagnosis: Unsteadiness on feet (R26.81);Cognitive communication deficit (R41.841);Hemiplegia and hemiparesis Symptoms and signs involving cognitive functions: Cerebral infarction Hemiplegia - Right/Left: Right Hemiplegia - dominant/non-dominant: Dominant Hemiplegia - caused by: Cerebral infarction   Activity Tolerance Patient tolerated treatment well   Patient Left in chair;with call bell/phone within reach;with chair alarm set   Nurse Communication Mobility status        Time: 1130-1156 OT Time Calculation (min): 26 min  Charges: OT General  Charges $OT Visit: 1 Visit OT Treatments $Self Care/Home Management : 8-22 mins  Sherryl Manges OTR/L [phone]  [name] [name] 08/31/2017, 5:11 PM

## 2017-08-31 NOTE — Progress Notes (Signed)
PROGRESS NOTE    [name] [name]  ZOX:[id number] DOB: 1951-07-29 DOA: 08/22/2017 PCP: Patient, No Pcp Per   Chief Complaint  Patient presents with  . Altered Mental Status    Brief Narrative:   HPI on 08/22/2017 by [name]  [name] [name] is a 66 y.o. male with unknown prior medical history, per ED notes a family member found him on the floor in urine, they went by his home because he hadn't heard from him in two days. They asked the mother who was at the home how long this had been going and she also stated two days. Upon presentation the patient was completely flaccid R side, oriented name only, unable to speak and wouldn't follow commands. 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  We are asked to see for admission.   Patient unable to speak, but does weakly respond to simple questions.   Per pt's step- brother, [name] [name], the patient has never been sick.  Walks every day, doesn't smoke or drink.  He grew up in [place], [place].  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  Never went to the doctor, never complained about anything.  He retired 5-10 yrs ago.  He doesn't take any medication that the step-brother knows of.   Their mother had a stroke last year and the patient has been looking after her. It can be stressful for the stepbrother.     Interim history  Found to have acute CVA and transferred [facility] from [facility]. Neurology consulted and following. Patient also found to have sinus bradycardia, cardiology consulted and appreciated. Patient plan for TEE with possible loop recorder on 08/28/2017. Patient noted to have absent pulses in the lower ext B/L, doppler showed total occlusions of arteries. Placed on IV heparin, vascular surgery consulted- s/p fasciotomy.Patient also developed A fib with RVR.   Subjective  Patient in bed, appears comfortable, denies any headache, no fever, no chest pain or pressure, no shortness of  breath , no abdominal pain. No focal weakness.   Assessment & Plan    Acute left MCA CVA  - Presented with right-sided Hemiparesis as well as expressive aphasia - imaging showed large acute left MCA territory infarc with cytotoxic edema causing a 5mm of midlinie shift. Carotid Doppler: Bilateral ICA 1-39% stenosis, vertebral artery flow antegrade, Echocardiogram shows an EF of 55%, no source of emboli. LDL 78, hemoglobin A1c 5.2. Speech therapy working with patient, currently on dysphagia 1 diet, seen by stroke team and full stroke protocol was followed, currently on aspirin, statin along with heparin drip will witch to. Eliquis after discussion with cardiology on 08/31/2017, pharmacy to dose. Source could have been atrial fibrillation. Will require rehabilitation upon discharge.   Peripheral vascular disease/ Bilateral lower extremity Ischemia with compartment syndrome - was found to have age intermediate obstructive thrombosis involving the common femoral, superficial femoral, popliteal, posterior tibial, peroneal, anterior tibial arteries bilaterally, vascular surgery was consulted patient was placed on heparin drip/ Eliquis. Being followed by vascular surgery and status post  Bilateral embolectomy iliac femoral popliteal, 4 compartment fasciotomy bilaterally, s/p closure of B/L leg fasciotomies on 08/28/2017. Underwent another surgery on 08/30/2017 for Right popliteal and tibial endarterectomy with patch angioplasty of right popliteal artery and tibial peroneal trunk, right leg has a drain which needs to be left in place untill output is less than 30 mL per day. Continue anticoagulation.   New Atrial fibrillation with RVR  with likely underlying sick sinus syndrome [place] vasc 2 score of at least 3 - Cardiology consulted and appreciated, currently bradycardic hence a rate controlling agents are held, discussed with cardiologist Dr. Delton See in detail on 08/31/2017, plan is to switch him to Eliquis, patient  has refused pacemaker placement, he is clinically competent to ma decisions, this was also conveyed to his brother by me personally on 08/31/2017.Marland Kitchen    Hypotension - IV fluids and midodrine, stable random TSH and cortisol.   Dysuria -UA unremarkable for infection x 2, UA did show large hemoglobin, likely foley related to trauma. This problem has resolved.   Dehydration/hypernatremia, with prerenal Azotemia, mild rhabdomyolysis.  All resolved with hydration.  Hyperlipidemia -started on statin     DVT Prophylaxis   heparin  Code Status: Full  Family Communication: called and updated brother on 08/31/2017  Disposition Plan: Admitted.Continue to monitor in stepdown. Pending TEE/loop on 9/12. Dispo pending   Consultants Neurology Cardiology Vascular surgery Inpatient rehab  Procedures  Echocardiogram Lower extremity doppler Lower ext 4 compartment fasciotomy bilaterally Closure of LE fasciotomies  Right popliteal and tibial endarterectomy with patch angioplasty of right popliteal artery and tibial peroneal trunk  Antibiotics   Anti-infectives    Start     Dose/Rate Route Frequency Ordered Stop   08/30/17 1600  cefUROXime (ZINACEF) 1.5 g in dextrose 5 % 50 mL IVPB     1.5 g 100 mL/hr over 30 Minutes Intravenous On call to O.R. 08/30/17 0835 08/30/17 1511   08/28/17 0600  cefUROXime (ZINACEF) 1.5 g in dextrose 5 % 50 mL IVPB     1.5 g 100 mL/hr over 30 Minutes Intravenous On call to O.R. 08/27/17 1502 08/29/17 0323   08/27/17 0500  cefUROXime (ZINACEF) 1.5 g in dextrose 5 % 50 mL IVPB     1.5 g 100 mL/hr over 30 Minutes Intravenous Every 12 hours 08/26/17 2132 08/27/17 1753   08/26/17 1600  cefUROXime (ZINACEF) 1.5 g in dextrose 5 % 50 mL IVPB     1.5 g 100 mL/hr over 30 Minutes Intravenous To Surgery 08/26/17 1543 08/27/17 0502   08/22/17 1715  vancomycin (VANCOCIN) IVPB 1000 mg/200 mL premix     1,000 mg 200 mL/hr over 60 Minutes Intravenous  Once 08/22/17 1710 08/22/17  1935   08/22/17 1715  piperacillin-tazobactam (ZOSYN) IVPB 3.375 g     3.375 g 100 mL/hr over 30 Minutes Intravenous  Once 08/22/17 1710 08/22/17 1838      Objective:   Vitals:   08/30/17 2308 08/31/17 0308 08/31/17 0725 08/31/17 0800  BP: 99/60 (!) 94/59 101/63 96/62  Pulse: 73 71 (!) 52 (!) 47  Resp: Temp: (!) 96.9 F (36.1 C) (!) 97.5 F (36.4 C) 97.7 F (36.5 C)   TempSrc: Axillary Oral Oral   SpO2: 99% 99% 100% 100%  Weight:      Height:        Intake/Output Summary (Last 24 hours) at 08/31/17 1030 Last data filed at 08/31/17 1000  Gross per 24 hour  Intake          3219.33 ml  Output             3397 ml  Net          -177.67 ml   Filed Weights   08/23/17 0500 08/23/17 2200 08/30/17 1419  Weight: 78.5 kg (173 lb 1 oz) 80.9 kg (178 lb 5.6 oz) 80.7 kg (178 lb)    Exam  Awake Alert, Oriented X 3, No new F.N deficits, Normal affect [place].AT,PERRAL Supple Neck,No JVD, No cervical lymphadenopathy appriciated.  Symmetrical Chest wall movement, Good air movement bilaterally, CTAB RRR,No Gallops,Rubs or new Murmurs, No Parasternal Heave +ve B.Sounds, Abd Soft, No tenderness, No organomegaly appriciated, No rebound - guarding or rigidity. No Cyanosis, Clubbing or edema, No new Rash or bruise  Dense right-sided hemiparesis and excessive aphasia Fasciotomy scar stable in both lower extremities with staples in place. Drain in Rt leg   Data Reviewed: I have personally reviewed following labs and imaging studies  CBC:  Recent Labs Lab 08/27/17 0411 08/28/17 0207 08/29/17 0300 08/30/17 0321 08/31/17 0714  WBC 13.3*  13.4* 12.1* 11.2* 11.1* 12.1*  HGB 13.1  12.9* 11.8* 11.2* 9.5* 9.4*  HCT 38.4*  38.2* 35.4* 33.3* 28.7* 28.6*  MCV 89.5  89.9 90.3 90.7 90.5 90.5  PLT 154  148* 145* 143* 160 184   Basic Metabolic Panel:  Recent Labs Lab 08/25/17 0709 08/27/17 0411 08/28/17 0207 08/29/17 0300 08/30/17 0321  NA 144 139  137 138 134* 135  K  4.6 4.6  4.4 3.9 4.1 4.0  CL 115* 105  103 105 104 106  CO2 22 27  26 26 25 25   GLUCOSE 86 147*  146* 118* 102* 99  BUN 22* 12  12 18 17 17   CREATININE 1.07 1.18  1.19 1.12 1.12 1.06  CALCIUM 8.4* 8.3*  8.2* 8.2* 8.1* 8.0*   GFR: Estimated Creatinine Clearance: 76.4 mL/min (by C-G formula based on SCr of 1.06 mg/dL). Liver Function Tests: No results for input(s): AST, ALT, ALKPHOS, BILITOT, PROT, ALBUMIN in the last 168 hours. No results for input(s): LIPASE, AMYLASE in the last 168 hours. No results for input(s): AMMONIA in the last 168 hours. Coagulation Profile: No results for input(s): INR, PROTIME in the last 168 hours. Cardiac Enzymes:  Recent Labs Lab 08/27/17 0411  CKTOTAL 175   BNP (last 3 results) No results for input(s): PROBNP in the last 8760 hours. HbA1C: No results for input(s): HGBA1C in the last 72 hours. CBG:  Recent Labs Lab 08/26/17 2105 08/27/17 0839 08/28/17 1144  GLUCAP 92 126* 97   Lipid Profile: No results for input(s): CHOL, HDL, LDLCALC, TRIG, CHOLHDL, LDLDIRECT in the last 72 hours. Thyroid Function Tests:  Recent Labs  08/30/17 0819  TSH 1.627   Anemia Panel: No results for input(s): VITAMINB12, FOLATE, FERRITIN, TIBC, IRON, RETICCTPCT in the last 72 hours. Urine analysis:    Component Value Date/Time   COLORURINE YELLOW 08/25/2017 1026   APPEARANCEUR HAZY (A) 08/25/2017 1026   LABSPEC 1.016 08/25/2017 1026   PHURINE 5.0 08/25/2017 1026   GLUCOSEU NEGATIVE 08/25/2017 1026   HGBUR LARGE (A) 08/25/2017 1026   BILIRUBINUR NEGATIVE 08/25/2017 1026   KETONESUR NEGATIVE 08/25/2017 1026   PROTEINUR 30 (A) 08/25/2017 1026   NITRITE NEGATIVE 08/25/2017 1026   LEUKOCYTESUR MODERATE (A) 08/25/2017 1026   Sepsis Labs: @LABRCNTIP (procalcitonin:4,lacticidven:4)    Recent Results (from the past 240 hour(s))  Blood Culture (routine x 2)     Status: None   Collection Time: 08/22/17  4:40 PM  Result Value Ref Range Status    Specimen Description RIGHT ANTECUBITAL  Final   Special Requests   Final    BOTTLES DRAWN AEROBIC AND ANAEROBIC Blood Culture adequate volume   Culture NO GROWTH 5 DAYS  Final   Report Status 08/27/2017 FINAL  Final  Blood Culture (routine x 2)     Status: None  Collection Time: 08/22/17  4:44 PM  Result Value Ref Range Status   Specimen Description BLOOD LEFT HAND  Final   Special Requests   Final    BOTTLES DRAWN AEROBIC AND ANAEROBIC Blood Culture adequate volume   Culture NO GROWTH 5 DAYS  Final   Report Status 08/27/2017 FINAL  Final  MRSA PCR Screening     Status: None   Collection Time: 08/23/17  1:59 AM  Result Value Ref Range Status   MRSA by PCR NEGATIVE NEGATIVE Final    Comment:        The GeneXpert MRSA Assay (FDA approved for NASAL specimens only), is one component of a comprehensive MRSA colonization surveillance program. It is not intended to diagnose MRSA infection nor to guide or monitor treatment for MRSA infections.   Urine Culture     Status: None   Collection Time: 08/25/17 10:26 AM  Result Value Ref Range Status   Specimen Description URINE, CLEAN CATCH  Final   Special Requests NONE  Final   Culture NO GROWTH  Final   Report Status 08/26/2017 FINAL  Final      Radiology Studies: No results found.   Scheduled Meds: . aspirin  325 mg Oral Daily  . atorvastatin  20 mg Oral q1800  . chlorhexidine  15 mL Mouth Rinse BID  . docusate sodium  100 mg Oral Daily  . mouth rinse  15 mL Mouth Rinse q12n4p  . midodrine  5 mg Oral TID WC  . pantoprazole  40 mg Oral Daily   Continuous Infusions: . sodium chloride 100 mL/hr at 08/31/17 0740  . amiodarone Stopped (08/30/17 0730)  . heparin 1,400 Units/hr (08/31/17 0900)  . magnesium sulfate 1 - 4 g bolus IVPB       LOS: 9 days   Time Spent in minutes   35 minutes Signature  Susa Raring M.D on 08/31/2017 at 10:30 AM  Between 7am to 7pm - Pager - [phone] ( page via [email or web address], text pages  only, please mention full 10 digit call back number).  After 7pm go to [email or web address] - password [facility]

## 2017-08-31 NOTE — Progress Notes (Signed)
ANTICOAGULATION CONSULT NOTE - Follow Up Consult  Pharmacy Consult for Heparin>> apixaban  Indication: afib  No Known Allergies  Patient Measurements: Height: 6\' 1"  (185.4 cm) Weight: 178 lb (80.7 kg) IBW/kg (Calculated) : 79.9 Heparin Dosing Weight: 80.9 kg  Vital Signs: Temp: 97.7 F (36.5 C) (09/13 0725) Temp Source: Oral (09/13 0725) BP: 96/62 (09/13 0800) Pulse Rate: 47 (09/13 0800)  Labs:  Recent Labs  08/29/17 0300 08/30/17 0321 08/30/17 1234 08/31/17 0714  HGB 11.2* 9.5*  --  9.4*  HCT 33.3* 28.7*  --  28.6*  PLT 143* 160  --  184  HEPARINUNFRC 0.34 0.19* 0.39 0.86*  CREATININE 1.12 1.06  --   --     Estimated Creatinine Clearance: 76.4 mL/min (by C-G formula based on SCr of 1.06 mg/dL).   Assessment: [name] who was found down with AMS on 9/4 and brought into the [facility]. Work-up on admission revealed a new acute L-MCA CVA. Dopplers done 9/8 show occluded B/L LE arteries and pharmacy was consulted to start heparin for anticoagulation and to hold boluses in the setting of a recent CVA.   Currently on heparin gtt but to transition to apixaban for treatment of atrial fibrillation.  Goal of Therapy:  Monitor platelets by anticoagulation protocol: Yes   Plan:  1. Transition to apixaban 5 mg BID 2. Stop heparin and all labs/levels 3. CBC every 72 hours   Pollyann SamplesAndy Ifeanyichukwu Wickham, PharmD, BCPS 08/31/2017, 10:50 AM

## 2017-08-31 NOTE — Progress Notes (Signed)
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  D/c when less than 30 ml over 24 hours Continue heparin transition to oral anticoagulation per primary team  Fabienne BrunsFields, Charles 08/31/2017 7:32 AM --  Laboratory Lab Results:  Recent Labs  08/29/17 0300 08/30/17 0321  WBC 11.2* 11.1*  HGB 11.2* 9.5*  HCT 33.3* 28.7*  PLT 143* 160   BMET  Recent Labs  08/29/17 0300 08/30/17 0321  NA 134* 135  K 4.1 4.0  CL 104 106  CO2 25 25  GLUCOSE 102* 99  BUN 17 17  CREATININE 1.12 1.06  CALCIUM 8.1* 8.0*    COAG Lab Results  Component Value Date   INR 1.09 08/22/2017   No results found for: PTT

## 2017-08-31 NOTE — Progress Notes (Signed)
ANTICOAGULATION CONSULT NOTE - Follow Up Consult  Pharmacy Consult for Heparin Indication: Occludeded arteries LE B/L  No Known Allergies  Patient Measurements: Height: 6\' 1"  (185.4 cm) Weight: 178 lb (80.7 kg) IBW/kg (Calculated) : 79.9 Heparin Dosing Weight: 80.9 kg  Vital Signs: Temp: 97.7 F (36.5 C) (09/13 0725) Temp Source: Oral (09/13 0725) BP: 101/63 (09/13 0725) Pulse Rate: 52 (09/13 0725)  Labs:  Recent Labs  08/29/17 0300 08/30/17 0321 08/30/17 1234 08/31/17 0714  HGB 11.2* 9.5*  --  9.4*  HCT 33.3* 28.7*  --  28.6*  PLT 143* 160  --  184  HEPARINUNFRC 0.34 0.19* 0.39 0.86*  CREATININE 1.12 1.06  --   --     Estimated Creatinine Clearance: 76.4 mL/min (by C-G formula based on SCr of 1.06 mg/dL).   Assessment: [name] who was found down with AMS on 9/4 and brought into the [facility]. Work-up on admission revealed a new acute L-MCA CVA. Dopplers done 9/8 show occluded B/L LE arteries and pharmacy was consulted to start heparin for anticoagulation and to hold boluses in the setting of a recent CVA.   Heparin resumed s/p vascular procedure on 9/12 pm. Heparin level this am is supratherapeutic at 0.86. Level thought to be drawn correctly. CBC stable with no sxs of bleeding.    Goal of Therapy:  Heparin level 0.3-0.5 units/ml Monitor platelets by anticoagulation protocol: Yes   Plan:  1. Decrease heparin infusion to 1400 units / hr 2. Repeat heparin level in 8 hours 3. Daily heparin level and CBC  4. Follow up plans for oral anticaogulation   Pollyann SamplesAndy Belle Charlie, PharmD, BCPS 08/31/2017, 8:22 AM

## 2017-08-31 NOTE — Progress Notes (Signed)
  Speech Language Pathology Treatment: Dysphagia;Cognitive-Linquistic  Patient Details Name: [name] [name] MRN: [id number] DOB: 10/16/1951 Today's Date: 08/31/2017 Time: 4098-11911002-1021 SLP Time Calculation (min) (ACUTE ONLY): 19 min  Assessment / Plan / Recommendation Clinical Impression  SLP provided skilled observation during breakfast meal. Pt had a pill pocketed in his mouth upon SLP arrival, which nurse was attempting to clear. SLP provided Mod cues and repositioning of capsule to clear. Pt consumed purees from tray and advanced soft solids provided by SLP with no significant difference in R sided pocketing. Min cues were provided for clearance of pocketed material. Recommend advancement to Dys 2 textures, continuing nectar thick liquids.  Pt still did not vocalize despite Max cueing, but he is following commands in functional context with Min cues. He is still <50% accurate with simple yes/no questions. He used gestures spontaneously and very functionally with Min question cues to ask for the lights to be turned off and the TV volume to be turned up.   HPI HPI: Ptis a 66 y.o.male[facility] with a large subacute MCA infarct and 5 mm midline shift. [facility] concern for swelling. No significant PMH is known.      SLP Plan  Continue with current plan of care       Recommendations  Diet recommendations: Dysphagia 2 (fine chop);Nectar-thick liquid Liquids provided via: Straw Medication Administration: Crushed with puree Supervision: Patient able to self feed;Full supervision/cueing for compensatory strategies Compensations: Slow rate;Small sips/bites;Lingual sweep for clearance of pocketing;Monitor for anterior loss Postural Changes and/or Swallow Maneuvers: Seated upright 90 degrees;Upright 30-60 min after meal                Oral Care Recommendations: Oral care BID Follow up Recommendations: Inpatient Rehab SLP Visit Diagnosis:  Dysphagia, oropharyngeal phase (R13.12);Aphasia (R47.01) Plan: Continue with current plan of care       GO                [name] [name], [name] [name] 08/31/2017, 10:35 AM  [name] [name], [name] [name] [phone]

## 2017-08-31 NOTE — Progress Notes (Signed)
Progress Note  Patient Name: [name] [name] Date of Encounter: 08/31/2017  Primary Cardiologist: new patient  Subjective   The patient is in minimal pain, he has on and off dizziness. He states that he doesn't wish to have a pacemaker.  Inpatient Medications    Scheduled Meds: . aspirin  325 mg Oral Daily  . atorvastatin  20 mg Oral q1800  . chlorhexidine  15 mL Mouth Rinse BID  . docusate sodium  100 mg Oral Daily  . mouth rinse  15 mL Mouth Rinse q12n4p  . midodrine  5 mg Oral TID WC  . pantoprazole  40 mg Oral Daily   Continuous Infusions: . sodium chloride 100 mL/hr at 08/31/17 0740  . amiodarone Stopped (08/30/17 0730)  . heparin 1,400 Units/hr (08/31/17 0900)  . magnesium sulfate 1 - 4 g bolus IVPB     PRN Meds: acetaminophen **OR** acetaminophen, acetaminophen, alum & mag hydroxide-simeth, guaiFENesin-dextromethorphan, hydrALAZINE, labetalol, magnesium sulfate 1 - 4 g bolus IVPB, metoprolol tartrate, ondansetron, phenol, potassium chloride, RESOURCE THICKENUP CLEAR, senna-docusate   Vital Signs    Vitals:   08/30/17 2308 08/31/17 0308 08/31/17 0725 08/31/17 0800  BP: 99/60 (!) 94/59 101/63 96/62  Pulse: 73 71 (!) 52 (!) 47  Resp: Temp: (!) 96.9 F (36.1 C) (!) 97.5 F (36.4 C) 97.7 F (36.5 C)   TempSrc: Axillary Oral Oral   SpO2: 99% 99% 100% 100%  Weight:      Height:        Intake/Output Summary (Last 24 hours) at 08/31/17 0936 Last data filed at 08/31/17 0700  Gross per 24 hour  Intake          3196.38 ml  Output             3822 ml  Net          -625.62 ml   Filed Weights   08/23/17 0500 08/23/17 2200 08/30/17 1419  Weight: 173 lb 1 oz (78.5 kg) 178 lb 5.6 oz (80.9 kg) 178 lb (80.7 kg)   Telemetry    Sinus bradycardia, PACs in a pattern of bigeminy, short episodes of a-fib - Personally Reviewed  Physical Exam  GEN: No acute distress.   Neck: No JVD Cardiac: RRR, no murmurs, rubs, or gallops.  Respiratory: Clear to  auscultation bilaterally. GI: Soft, nontender, non-distended  MS: No edema; No deformity. Neuro:  Nonfocal  Psych: Normal affect   Labs    Chemistry  Recent Labs Lab 08/28/17 0207 08/29/17 0300 08/30/17 0321  NA 138 134* 135  K 3.9 4.1 4.0  CL 105 104 106  CO2 GLUCOSE 118* 102* 99  BUN CREATININE 1.12 1.12 1.61  CALCIUM 8.2* 8.1* 8.0*  GFRNONAA >60 >60 >60  GFRAA >60 >60 >60  ANIONGAP 7 5 4*     Hematology  Recent Labs Lab 08/29/17 0300 08/30/17 0321 08/31/17 0714  WBC 11.2* 11.1* 12.1*  RBC 3.67* 3.17* 3.16*  HGB 11.2* 9.5* 9.4*  HCT 33.3* 28.7* 28.6*  MCV 90.7 90.5 90.5  MCH 30.5 30.0 29.7  MCHC 33.6 33.1 32.9  RDW 13.0 13.1 13.1  PLT 143* 160 184   Cardiac Enzymes No results for input(s): TROPONINI in the last 168 hours.  No results for input(s): TROPIPOC in the last 168 hours.  BNPNo results for input(s): BNP, PROBNP in the last 168 hours.   DDimer No results for input(s): DDIMER in the  last 168 hours.   Radiology    No results found.  Cardiac Studies   TTE: 09/02/17 - Left ventricle: The cavity size was normal. Wall thickness was   normal. Systolic function was normal. The estimated ejection   fraction was 55%. Wall motion was normal; there were no regional   wall motion abnormalities. Left ventricular diastolic function   parameters were normal. - Aortic valve: Trileaflet; mildly thickened leaflets. There was   trivial regurgitation. - Aorta: Mild aortic root dilatation. Aortic root dimension: 39 mm   (ED). - Mitral valve: There was mild regurgitation. - Left atrium: The atrium was mildly dilated. - Atrial septum: No defect or patent foramen ovale was identified. - Pulmonic valve: There was mild regurgitation.    Patient Profile     66 y.o. male   Assessment & Plan    1. SSS - with profound bradycardia, very frequent PACs, frequently in a pattern of bigeminy and episodes of a-fib with RVR.  Amiodarone on hold  sec to bradycardia, he was hypotensive with a-fib with RVR. He might eventually need a pacemaker. Currently refuses but is open to discussion if a-fib recurs or bradycardia gets worse. 2.  Peripheral vascular disease with bilateral embolectomies, extensive popliteal tibial SFA embolus, s/p Right popliteal and tibial endarterectomy with patch angioplasty of right popliteal artery and tibial peroneal trunk.   3.  Recent large left brain stroke 4. He appears euvolemic  For questions or updates, please contact [facility] Please consult [email or web address] for contact info under Cardiology/STEMI. Daytime calls, contact the Day Call APP (6a-8a) or assigned team (Teams A-D) provider (7:30a - 5p). All other daytime calls (7:30-5p), contact the Card Master @ [phone].   Nighttime calls, contact the assigned APP (5p-8p) or MD (6:30p-8p). Overnight calls (8p-6a), contact the on call Fellow @ [phone].   Signed, Tobias AlexanderKatarina Deaun Rocha, MD  08/31/2017, 9:36 AM

## 2017-08-31 NOTE — Progress Notes (Signed)
Physical Therapy Treatment Patient Details Name: [name] [name] MRN: [id number] DOB: 04/12/1951 Today's Date: 08/31/2017    History of Present Illness Patient is a 66 y/o male who was found down at home covered in urine. 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 No PMH as pt has not been to the doctor. Noted bilateral leg ischemia on 9/8; to OR for Bilateral embolectomy iliac femoral popliteal, 4 compartment fasciotomy bilaterally that evening, with closure of fasciotomies 08/28/17.    PT Comments    Patient seen for mobility progression. Shows signs of improvements in right side functional use. Tolerated sit <> stand trails and pre gait activities prior to OOB to char. Will continue to see and progress as tolerated.   Follow Up Recommendations  SNF     Equipment Recommendations       Recommendations for Other Services       Precautions / Restrictions Precautions Precautions: Fall Precaution Comments: RUE hemiparesis, right inattention, aphasia    Mobility  Bed Mobility Overal bed mobility: Needs Assistance Bed Mobility: Supine to Sit     Supine to sit: Min assist     General bed mobility comments: Moderate assist to elevate trunk to upright and rotate hips to EOB  Transfers Overall transfer level: Needs assistance Equipment used: Rolling walker (2 wheeled) Transfers: Sit to/from [employer] Sit to Stand: Mod assist;+2 physical assistance   Squat pivot transfers: Mod assist     General transfer comment: Moderate assist to elevate to standing using 3 musketeer technique. Patient bale to push through RUE. VCs for quad setting RLE and manual assit to block RLE from buckling. Patient able to perform squat pivot to chair with moderate assist in 3 parts with tactile cues for hand placement  Ambulation/Gait                 Stairs            Wheelchair Mobility    Modified Rankin (Stroke Patients Only) Modified  Rankin (Stroke Patients Only) Pre-Morbid Rankin Score: No symptoms Modified Rankin: Severe disability     Balance Overall balance assessment: Needs assistance Sitting-balance support: Feet supported;No upper extremity supported Sitting balance-Leahy Scale: Fair       Standing balance-Leahy Scale: Poor Standing balance comment: RLE knee buckling during weight shifts                            Cognition Arousal/Alertness: Awake/alert Behavior During Therapy: Flat affect Overall Cognitive Status: Difficult to assess Area of Impairment: Following commands;Problem solving                   Current Attention Level: Sustained   Following Commands: Follows multi-step commands consistently     Problem Solving: Slow processing;Decreased initiation;Difficulty sequencing;Requires verbal cues;Requires tactile cues General Comments: pt continues to have unreliable yes/no response, pt remains non verbal      Exercises Other Exercises Other Exercises: Pre gait activities weight shifts x5 with 3 musketeer approach Other Exercises: Pre gait activities lateral weight shift with single leg stance (+2 max assist)    General Comments        Pertinent Vitals/Pain Pain Assessment: Faces Faces Pain Scale: No hurt    Home Living                      Prior Function  PT Goals (current goals can now be found in the care plan section) Acute Rehab PT Goals Patient Stated Goal: none stated but most likely to return to PLOF PT Goal Formulation: Patient unable to participate in goal setting Time For Goal Achievement: 09/08/17 Potential to Achieve Goals: Fair Progress towards PT goals: Progressing toward goals    Frequency    Min 4X/week      PT Plan Discharge plan needs to be updated;Other (comment) (Noted family need to opt for SNF)    Co-evaluation PT/OT/SLP Co-Evaluation/Treatment: Yes Reason for Co-Treatment: Complexity of the patient's  impairments (multi-system involvement) PT goals addressed during session: Mobility/safety with mobility        AM-PAC PT "6 Clicks" Daily Activity  Outcome Measure  Difficulty turning over in bed (including adjusting bedclothes, sheets and blankets)?: A Lot Difficulty moving from lying on back to sitting on the side of the bed? : Unable Difficulty sitting down on and standing up from a chair with arms (e.g., wheelchair, bedside commode, etc,.)?: Unable Help needed moving to and from a bed to chair (including a wheelchair)?: A Lot Help needed walking in hospital room?: Total Help needed climbing 3-5 steps with a railing? : Total 6 Click Score: 8    End of Session Equipment Utilized During Treatment: Gait belt Activity Tolerance: Patient tolerated treatment well Patient left: in chair;with call bell/phone within reach;with chair alarm set Nurse Communication: Mobility status;Need for lift equipment Huntley Dec(Sara stedy helpful) PT Visit Diagnosis: Hemiplegia and hemiparesis;Unsteadiness on feet (R26.81);Difficulty in walking, not elsewhere classified (R26.2) Hemiplegia - Right/Left: Right Hemiplegia - dominant/non-dominant: Dominant Hemiplegia - caused by: Cerebral infarction     Time: 1130-1156 PT Time Calculation (min) (ACUTE ONLY): 26 min  Charges:  $Therapeutic Activity: 8-22 mins                    G Codes:       Charlotte Crumbevon Torina Ey, PT DPT  Board Certified Neurologic Specialist [phone]    Fabio AsaDevon J Sharada Albornoz 08/31/2017, 1:56 PM

## 2017-08-31 NOTE — Progress Notes (Signed)
Patient and family would [facility]- facility aware and able to accept patient pending bed availability  CSW will continue to follow  [name] SisJenna H. Treyshawn Muldrew, LCSW Clinical Social Worker [phone]

## 2017-09-01 LAB — CBC
HEMATOCRIT: 23.6 % — AB (ref 39.0–52.0)
HEMOGLOBIN: 7.8 g/dL — AB (ref 13.0–17.0)
MCH: 30.2 pg (ref 26.0–34.0)
MCHC: 33.1 g/dL (ref 30.0–36.0)
MCV: 91.5 fL (ref 78.0–100.0)
Platelets: 180 10*3/uL (ref 150–400)
RBC: 2.58 MIL/uL — ABNORMAL LOW (ref 4.22–5.81)
RDW: 13.5 % (ref 11.5–15.5)
WBC: 10.7 10*3/uL — ABNORMAL HIGH (ref 4.0–10.5)

## 2017-09-01 LAB — FOLATE: Folate: 9.7 ng/mL (ref >5.9)

## 2017-09-01 LAB — VITAMIN B12: Vitamin B-12: 644 pg/mL (ref 180–914)

## 2017-09-01 LAB — FERRITIN: Ferritin: 517 ng/mL — ABNORMAL HIGH (ref 24–336)

## 2017-09-01 LAB — IRON AND TIBC
Iron: 39 ug/dL — ABNORMAL LOW (ref 45–182)
SATURATION RATIOS: 20 % (ref 17.9–39.5)
TIBC: 192 ug/dL — ABNORMAL LOW (ref 250–450)
UIBC: 153 ug/dL

## 2017-09-01 LAB — RETICULOCYTES
RBC.: 2.69 MIL/uL — AB (ref 4.22–5.81)
RETIC CT PCT: 3.4 % — AB (ref 0.4–3.1)
Retic Count, Absolute: 91.5 10*3/uL (ref 19.0–186.0)

## 2017-09-01 MED ORDER — PANTOPRAZOLE SODIUM 40 MG PO TBEC
40.0000 mg | DELAYED_RELEASE_TABLET | Freq: Two times a day (BID) | ORAL | Status: DC
  Administered 2017-09-01 – 2017-09-04 (×6): 40 mg via ORAL
  Filled 2017-09-01 (×7): qty 1

## 2017-09-01 MED ORDER — SODIUM CHLORIDE 0.9 % IV BOLUS (SEPSIS)
500.0000 mL | Freq: Once | INTRAVENOUS | Status: AC
  Administered 2017-09-01: 500 mL via INTRAVENOUS

## 2017-09-01 NOTE — Progress Notes (Signed)
[facility] [place]  Subjective  - awake and alert no leg pain   Objective (!) 93/58 (!) 41 98 F (36.7 C) (Oral) (!) 22 100%  Intake/Output Summary (Last 24 hours) at 09/01/17 1530 Last data filed at 09/01/17 1500  Gross per 24 hour  Intake          2753.34 ml  Output             2815 ml  Net           -61.66 ml   Right foot pink warm DP pT doppler Left foot PT doppler JP right leg 40 cc  Assessment/Planning: Viable extremities bilaterally D/c JP Will recheck Monday Dr Early on call this weekend if questions  [name] [name] 09/01/2017 3:30 PM --  Laboratory Lab Results:  Recent Labs  08/31/17 0714 09/01/17 0307  WBC 12.1* 10.7*  HGB 9.4* 7.8*  HCT 28.6* 23.6*  PLT 184 180   BMET  Recent Labs  08/30/17 0321  NA 135  K 4.0  CL 106  CO2 25  GLUCOSE 99  BUN 17  CREATININE 1.06  CALCIUM 8.0*    COAG Lab Results  Component Value Date   INR 1.09 08/22/2017   No results found for: PTT

## 2017-09-01 NOTE — Progress Notes (Signed)
Sheila Oats, RN        # 2.  I HAVE CHECK WITH [email or web address] AND NO RX COVERAGE      IN EP[place] MEDICAID- POTENTIAL

## 2017-09-01 NOTE — Anesthesia Postprocedure Evaluation (Signed)
Anesthesia Post Note  Patient: [name] [name]  Procedure(s) Performed: Procedure(s) (LRB): Right popliteal tibial endarectomy with patch angioplasty (Right)     Patient location during evaluation: PACU Anesthesia Type: General Level of consciousness: awake and alert Pain management: pain level controlled Vital Signs Assessment: post-procedure vital signs reviewed and stable Respiratory status: spontaneous breathing, nonlabored ventilation, respiratory function stable and patient connected to nasal cannula oxygen Cardiovascular status: blood pressure returned to baseline and stable Postop Assessment: no apparent nausea or vomiting Anesthetic complications: no    Last Vitals:  Vitals:   09/01/17 1900 09/01/17 2003  BP: 105/62 118/60  Pulse: (!) 46 (!) 50  Resp: (!) 21 (!) 21  Temp:  36.7 C  SpO2: 99% 97%    Last Pain:  Vitals:   09/01/17 2003  TempSrc: Oral  PainSc:                  Nyajah Hyson

## 2017-09-01 NOTE — Progress Notes (Signed)
  Speech Language Pathology Treatment: Dysphagia;Cognitive-Linquistic  Patient Details Name: RYLYN RANGANATHAN MRN: [id number] DOB: [birth year] Today's Date: 09/01/2017 Time: 4098-1191 SLP Time Calculation (min) (ACUTE ONLY): 24 min  Assessment / Plan / Recommendation Clinical Impression  SLP provided skilled observation and Mod cues for clearance of pocketed material with soft and pureed solids. One immediate cough was observed as pt was trying to consume a particularly tough piece of food. No other overt s/s of aspiration were observed. Min cues were provided for command following throughout task and no vocalizations were observed despite Max cues.    HPI HPI: Ptis a 66 y.o.malead[facility] with a large subacute MCA infarct and 5 mm midline shift. He was transferred to [facility] out of concern for swelling. No significant PMH is known.      SLP Plan  Continue with current plan of care       Recommendations  Diet recommendations: Dysphagia 2 (fine chop);Nectar-thick liquid Liquids provided via: Straw Medication Administration: Crushed with puree Supervision: Patient able to self feed;Full supervision/cueing for compensatory strategies Compensations: Slow rate;Small sips/bites;Lingual sweep for clearance of pocketing;Monitor for anterior loss Postural Changes and/or Swallow Maneuvers: Seated upright 90 degrees;Upright 30-60 min after meal                Oral Care Recommendations: Oral care BID Follow up Recommendations: Inpatient Rehab SLP Visit Diagnosis: Dysphagia, oropharyngeal phase (R13.12);Aphasia (R47.01) Plan: Continue with current plan of care       GO                Maxcine Ham 09/01/2017, 10:12 AM   Maxcine Ham, M.A. CCC-SLP [phone]

## 2017-09-01 NOTE — Plan of Care (Signed)
Problem: Health Behavior/Discharge Planning: Goal: Ability to manage health-related needs will improve Outcome: Progressing Patient working with team on placement to a SNF. Patient did not qualify for [name]>   Problem: Self-Care: Goal: Ability to participate in self-care as condition permits will improve Outcome: Progressing Patient working with PT/OT to improve ability to participate in care. Goal: Ability to communicate needs accurately will improve Outcome: Progressing Patient unable to speak- but motions and nods when asked questions.   Problem: Nutrition: Goal: Risk of aspiration will decrease Outcome: Progressing Patient advanced to Dysphagia 2 diet on day shift.

## 2017-09-01 NOTE — Progress Notes (Signed)
Progress Note  Patient Name: [name] [name] Date of Encounter: 09/01/2017  Primary Cardiologist: new patient  Subjective   The patient is in minimal pain, sleepy today. Some on and off dizziness.  Inpatient Medications    Scheduled Meds: . apixaban  5 mg Oral BID  . aspirin  325 mg Oral Daily  . atorvastatin  20 mg Oral q1800  . chlorhexidine  15 mL Mouth Rinse BID  . docusate sodium  100 mg Oral Daily  . mouth rinse  15 mL Mouth Rinse q12n4p  . midodrine  10 mg Oral TID WC  . pantoprazole  40 mg Oral BID   Continuous Infusions: . amiodarone Stopped (08/30/17 0730)  . magnesium sulfate 1 - 4 g bolus IVPB     PRN Meds: acetaminophen **OR** acetaminophen, acetaminophen, alum & mag hydroxide-simeth, guaiFENesin-dextromethorphan, hydrALAZINE, labetalol, magnesium sulfate 1 - 4 g bolus IVPB, metoprolol tartrate, ondansetron, phenol, potassium chloride, RESOURCE THICKENUP CLEAR, senna-docusate   Vital Signs    Vitals:   09/01/17 0500 09/01/17 0600 09/01/17 0700 09/01/17 0748  BP: (!) 89/60 (!) 99/57 (!) 101/55 96/67  Pulse:    91  Resp: Temp:    98.5 F (36.9 C)  TempSrc:    Oral  SpO2:    98%  Weight:      Height:        Intake/Output Summary (Last 24 hours) at 09/01/17 1134 Last data filed at 09/01/17 1100  Gross per 24 hour  Intake          2693.34 ml  Output             2815 ml  Net          -121.66 ml   Filed Weights   08/23/17 0500 08/23/17 2200 08/30/17 1419  Weight: 173 lb 1 oz (78.5 kg) 178 lb 5.6 oz (80.9 kg) 178 lb (80.7 kg)   Telemetry    Sinus bradycardia, PACs in a pattern of bigeminy, short episodes of a-fib - Personally Reviewed  Physical Exam  GEN: No acute distress.   Neck: No JVD Cardiac: RRR, no murmurs, rubs, or gallops.  Respiratory: Clear to auscultation bilaterally. GI: Soft, nontender, non-distended  MS: No edema; No deformity. Neuro:  Nonfocal  Psych: Normal affect   Labs    Chemistry  Recent Labs Lab  08/28/17 0207 08/29/17 0300 08/30/17 0321  NA 138 134* 135  K 3.9 4.1 4.0  CL 105 104 106  CO2 GLUCOSE 118* 102* 99  BUN CREATININE 1.12 1.12 1.06  CALCIUM 8.2* 8.1* 8.0*  GFRNONAA >60 >60 >60  GFRAA >60 >60 >60  ANIONGAP 7 5 4*     Hematology  Recent Labs Lab 08/30/17 0321 08/31/17 0714 09/01/17 0307  WBC 11.1* 12.1* 10.7*  RBC 3.17* 3.16* 2.58*  HGB 9.5* 9.4* 7.8*  HCT 28.7* 28.6* 23.6*  MCV 90.5 90.5 91.5  MCH 30.0 29.7 30.2  MCHC 33.1 32.9 33.1  RDW 13.1 13.1 13.5  PLT 160 184 180   Cardiac Enzymes No results for input(s): TROPONINI in the last 168 hours.  No results for input(s): TROPIPOC in the last 168 hours.  BNPNo results for input(s): BNP, PROBNP in the last 168 hours.   DDimer No results for input(s): DDIMER in the last 168 hours.   Radiology    No results found.  Cardiac Studies   TTE: 09/02/17 - Left ventricle: The cavity size  was normal. Wall thickness was   normal. Systolic function was normal. The estimated ejection   fraction was 55%. Wall motion was normal; there were no regional   wall motion abnormalities. Left ventricular diastolic function   parameters were normal. - Aortic valve: Trileaflet; mildly thickened leaflets. There was   trivial regurgitation. - Aorta: Mild aortic root dilatation. Aortic root dimension: 39 mm   (ED). - Mitral valve: There was mild regurgitation. - Left atrium: The atrium was mildly dilated. - Atrial septum: No defect or patent foramen ovale was identified. - Pulmonic valve: There was mild regurgitation.    Patient Profile     66 y.o. male   Assessment & Plan    1. SSS - with profound bradycardia, very frequent PACs, frequently in a pattern of bigeminy and episodes of a-fib with RVR.  Amiodarone on hold sec to bradycardia, he was hypotensive with a-fib with RVR. He might eventually need a pacemaker especially if a-fib recurs and we are unable to use any anti-arrhythmics sec to  bradycardia. Bradycardia while in bed, we will need to see chronotropic competence once  More mobile. 2.  Peripheral vascular disease with bilateral embolectomies, extensive popliteal tibial SFA embolus, s/p Right popliteal and tibial endarterectomy with patch angioplasty of right popliteal artery and tibial peroneal trunk.   3.  Recent large left brain stroke 4. He appears euvolemic  For questions or updates, please contact [facility] Please consult [email or web address] for contact info under Cardiology/STEMI. Daytime calls, contact the Day Call APP (6a-8a) or assigned team (Teams A-D) provider (7:30a - 5p). All other daytime calls (7:30-5p), contact the Card Master @ [phone].   Nighttime calls, contact the assigned APP (5p-8p) or MD (6:30p-8p). Overnight calls (8p-6a), contact the on call Fellow @ [phone].   Signed, Tobias Alexander, MD  09/01/2017, 11:34 AM

## 2017-09-01 NOTE — Discharge Instructions (Addendum)
Information on my medicine - ELIQUIS (apixaban)  Why was Eliquis prescribed for you? Eliquis was prescribed for you to reduce the risk of a blood clot forming that can cause a stroke if you have a medical condition called atrial fibrillation (a type of irregular heartbeat).  What do You need to know about Eliquis ? Take your Eliquis TWICE DAILY - one tablet in the morning and one tablet in the evening with or without food. If you have difficulty swallowing the tablet whole please discuss with your pharmacist how to take the medication safely.  Take Eliquis exactly as prescribed by your doctor and DO NOT stop taking Eliquis without talking to the doctor who prescribed the medication.  Stopping may increase your risk of developing a stroke.  Refill your prescription before you run out.  After discharge, you should have regular check-up appointments with your healthcare provider that is prescribing your Eliquis.  In the future your dose may need to be changed if your kidney function or weight changes by a significant amount or as you get older.  What do you do if you miss a dose? If you miss a dose, take it as soon as you remember on the same day and resume taking twice daily.  Do not take more than one dose of ELIQUIS at the same time to make up a missed dose.  Important Safety Information A possible side effect of Eliquis is bleeding. You should call your healthcare provider right away if you experience any of the following: ? Bleeding from an injury or your nose that does not stop. ? Unusual colored urine (red or dark brown) or unusual colored stools (red or black). ? Unusual bruising for unknown reasons. ? A serious fall or if you hit your head (even if there is no bleeding).  Some medicines may interact with Eliquis and might increase your risk of bleeding or clotting while on Eliquis. To help avoid this, consult your healthcare provider or pharmacist prior to using any new  prescription or non-prescription medications, including herbals, vitamins, non-steroidal anti-inflammatory drugs (NSAIDs) and supplements.  This website has more information on Eliquis (apixaban): http://[email or web address].com/eliquis/home   Additional charge instructions:  Please get your medications reviewed and adjusted by your Primary MD.  Please request your Primary MD to go over all Hospital Tests and Procedure/Radiological results at the follow up, please get all Hospital records sent to your Prim MD by signing hospital release before you go home.  If you had Pneumonia of Lung problems at the Hospital: Please get a 2 view Chest X ray done in 6-8 weeks after hospital discharge or sooner if instructed by your Primary MD.  If you have Congestive Heart Failure: Please call your Cardiologist or Primary MD anytime you have any of the following symptoms:  1) 3 pound weight gain in 24 hours or 5 pounds in 1 week  2) shortness of breath, with or without a dry hacking cough  3) swelling in the hands, feet or stomach  4) if you have to sleep on extra pillows at night in order to breathe  Follow cardiac low salt diet and 1.5 lit/day fluid restriction.  If you have diabetes Accuchecks 4 times/day, Once in AM empty stomach and then before each meal. Log in all results and show them to your primary doctor at your next visit. If any glucose reading is under 80 or above 300 call your primary MD immediately.  If you have Seizure/Convulsions/Epilepsy: Please do not drive, operate  heavy machinery, participate in activities at heights or participate in high speed sports until you have seen by Primary MD or a Neurologist and advised to do so again.  If you had Gastrointestinal Bleeding: Please ask your Primary MD to check a complete blood count within one week of discharge or at your next visit. Your endoscopic/colonoscopic biopsies that are pending at the time of discharge, will also need to followed by  your Primary MD.  Get Medicines reviewed and adjusted. Please take all your medications with you for your next visit with your Primary MD  Please request your Primary MD to go over all hospital tests and procedure/radiological results at the follow up, please ask your Primary MD to get all Hospital records sent to his/her office.  If you experience worsening of your admission symptoms, develop shortness of breath, life threatening emergency, suicidal or homicidal thoughts you must seek medical attention immediately by calling 911 or calling your MD immediately  if symptoms less severe.  You must read complete instructions/literature along with all the possible adverse reactions/side effects for all the Medicines you take and that have been prescribed to you. Take any new Medicines after you have completely understood and accpet all the possible adverse reactions/side effects.   Do not drive or operate heavy machinery when taking Pain medications.   Do not take more than prescribed Pain, Sleep and Anxiety Medications  Special Instructions: If you have smoked or chewed Tobacco  in the last 2 yrs please stop smoking, stop any regular Alcohol  and or any Recreational drug use.  Wear Seat belts while driving.  Please note You were cared for by a hospitalist during your hospital stay. If you have any questions about your discharge medications or the care you received while you were in the hospital after you are discharged, you can call the unit and asked to speak with the hospitalist on call if the hospitalist that took care of you is not available. Once you are discharged, your primary care physician will handle any further medical issues. Please note that NO REFILLS for any discharge medications will be authorized once you are discharged, as it is imperative that you return to your primary care physician (or establish a relationship with a primary care physician if you do not have one) for your  aftercare needs so that they can reassess your need for medications and monitor your lab values.  You can reach the hospitalist office at phone [phone] or fax [phone]   If you do not have a primary care physician, you can call [phone] for a physician referral.

## 2017-09-01 NOTE — Progress Notes (Signed)
PROGRESS NOTE    [name] [name]  ZOX:[id number] DOB: 10-21-1951 DOA: 08/22/2017 PCP: Patient, No Pcp Per   Chief Complaint  Patient presents with  . Altered Mental Status    Brief Narrative:   HPI on 08/22/2017 by [name]  [name] [name] is a 66 y.o. male with unknown prior medical history, per ED notes a family member found him on the floor in urine, they went by his home because he hadn't heard from him in two days. They asked the mother who was at the home how long this had been going and she also stated two days. Upon presentation the patient was completely flaccid R side, oriented name only, unable to speak and wouldn't follow commands. 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  We are asked to see for admission.   Patient unable to speak, but does weakly respond to simple questions.   Per pt's step- brother, [name], the patient has never been sick.  Walks every day, doesn't smoke or drink.  He grew up in [place], [place].  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  Never went to the doctor, never complained about anything.  He retired 5-10 yrs ago.  He doesn't take any medication that the step-brother knows of.   Their mother had a stroke last year and the patient has been looking after her. It can be stressful for the stepbrother.     Interim history  Found to have acute CVA and transferred to [facility] from [facility]. Neurology consulted and following. Patient also found to have sinus bradycardia, cardiology consulted and appreciated. Patient plan for TEE with possible loop recorder on 08/28/2017. Patient noted to have absent pulses in the lower ext B/L, doppler showed total occlusions of arteries. Placed on IV heparin, vascular surgery consulted- s/p fasciotomy.Patient also developed A fib with RVR.   Subjective  Patient in bed appears comfortable, understands questions and answers with head nod or left arm movements, denies  any headache or chest pain, denies any abdominal discomfort or shortness of breath, confirms that he does not want a pacemaker, confirms that he is DO NOT RESUSCITATE, agrees to a rehabilitation placement.   Assessment & Plan    Acute left MCA CVA  - Presented with right-sided Hemiparesis as well as expressive aphasia - imaging showed large acute left MCA territory infarc with cytotoxic edema causing a 5mm of midlinie shift. Carotid Doppler: Bilateral ICA 1-39% stenosis, vertebral artery flow antegrade, Echocardiogram shows an EF of 55%, no source of emboli. LDL 78, hemoglobin A1c 5.2. Speech therapy working with patient, currently on dysphagia 1 diet, seen by stroke team and full stroke protocol was followed, currently on Currently on aspirin, statin and now Eliquis after discussion with cardiology on 08/31/2017, pharmacy to dose. Source could have been atrial fibrillation. Will require rehabilitation upon discharge, social work consulted.   Peripheral vascular disease/ Bilateral lower extremity Ischemia with compartment syndrome - was found to have age intermediate obstructive thrombosis involving the common femoral, superficial femoral, popliteal, posterior tibial, peroneal, anterior tibial arteries bilaterally, vascular surgery was consulted patient was placed on heparin drip and now on Eliquis.   Being followed by vascular surgery and status post  Bilateral embolectomy iliac femoral popliteal, 4 compartment fasciotomy bilaterally, s/p closure of B/L leg fasciotomies on 08/28/2017. Underwent another surgery on 08/30/2017 for Right popliteal and tibial endarterectomy with patch angioplasty of right popliteal artery and tibial peroneal trunk, right  leg has a drain which needs to be left in place untill output is less than 30 mL per day. Continue anticoagulation + ASA.   Anemia. No signs of bleeding, no blood in stool or melena, check anemia panel, double his dose of PPI since he is on Eliquis and  aspirin, type screen, repeat H&H in the morning.  New Atrial fibrillation with RVR with likely underlying sick sinus syndrome [place] vasc 2 score of at least 3 - Cardiology consulted and appreciated, currently bradycardic hence a rate controlling agents are held, discussed with cardiologist Dr. Delton See in detail on 08/31/2017, plan is to switch him to Eliquis, patient has refused pacemaker placement, he is clinically competent to make decisions, this was also conveyed to his brother by me personally on 08/31/2017. Again confirmed with patient on 09/01/2017 and presents of his nurse [name], patient confirms that he does not want pacemaker, he is agreeable to medical treatment, wants to be DO NOT RESUSCITATE.    Hypotension - IV fluids and midodrine, stable random TSH and cortisol.   Dysuria -UA unremarkable for infection x 2, UA did show large hemoglobin, likely foley related to trauma. This problem has resolved.   Dehydration/hypernatremia, with prerenal Azotemia, mild rhabdomyolysis.  All resolved with hydration.  Hyperlipidemia -started on statin       DVT Prophylaxis   Eliquis  Code Status: Full  Family Communication: called and updated brother on 08/31/2017  Disposition Plan: Admitted.Continue to monitor in stepdown. Pending TEE/loop on 9/12. Dispo pending   Consultants Neurology Cardiology Vascular surgery Inpatient rehab  Procedures  Echocardiogram Lower extremity doppler Lower ext 4 compartment fasciotomy bilaterally Closure of LE fasciotomies  Right popliteal and tibial endarterectomy with patch angioplasty of right popliteal artery and tibial peroneal trunk  Antibiotics   Anti-infectives    Start     Dose/Rate Route Frequency Ordered Stop   08/30/17 1600  cefUROXime (ZINACEF) 1.5 g in dextrose 5 % 50 mL IVPB     1.5 g 100 mL/hr over 30 Minutes Intravenous On call to O.R. 08/30/17 0835 08/30/17 1511   08/28/17 0600  cefUROXime (ZINACEF) 1.5 g in dextrose 5 % 50 mL  IVPB     1.5 g 100 mL/hr over 30 Minutes Intravenous On call to O.R. 08/27/17 1502 08/29/17 0323   08/27/17 0500  cefUROXime (ZINACEF) 1.5 g in dextrose 5 % 50 mL IVPB     1.5 g 100 mL/hr over 30 Minutes Intravenous Every 12 hours 08/26/17 2132 08/27/17 1753   08/26/17 1600  cefUROXime (ZINACEF) 1.5 g in dextrose 5 % 50 mL IVPB     1.5 g 100 mL/hr over 30 Minutes Intravenous To Surgery 08/26/17 1543 08/27/17 0502   08/22/17 1715  vancomycin (VANCOCIN) IVPB 1000 mg/200 mL premix     1,000 mg 200 mL/hr over 60 Minutes Intravenous  Once 08/22/17 1710 08/22/17 1935   08/22/17 1715  piperacillin-tazobactam (ZOSYN) IVPB 3.375 g     3.375 g 100 mL/hr over 30 Minutes Intravenous  Once 08/22/17 1710 08/22/17 1838      Objective:   Vitals:   09/01/17 0500 09/01/17 0600 09/01/17 0700 09/01/17 0748  BP: (!) 89/60 (!) 99/57 (!) 101/55 96/67  Pulse:    91  Resp: Temp:    98.5 F (36.9 C)  TempSrc:    Oral  SpO2:    98%  Weight:      Height:        Intake/Output Summary (Last 24  hours) at 09/01/17 1023 Last data filed at 09/01/17 0700  Gross per 24 hour  Intake          2333.34 ml  Output             1715 ml  Net           618.34 ml   Filed Weights   08/23/17 0500 08/23/17 2200 08/30/17 1419  Weight: 78.5 kg (173 lb 1 oz) 80.9 kg (178 lb 5.6 oz) 80.7 kg (178 lb)    Exam  Awake Alert, Oriented X 3, No new F.N deficits, Normal affect, Dense right-sided hemiparesis with expressive aphasia [place].AT,PERRAL Supple Neck,No JVD, No cervical lymphadenopathy appriciated.  Symmetrical Chest wall movement, Good air movement bilaterally, CTAB RRR,No Gallops,Rubs or new Murmurs, No Parasternal Heave +ve B.Sounds, Abd Soft, No tenderness, No organomegaly appriciated, No rebound - guarding or rigidity. No Cyanosis, Clubbing or edema, No new Rash or bruise Fasciotomy scar stable in both lower extremities with staples in place. Drain in Rt leg   Data Reviewed: I have personally  reviewed following labs and imaging studies  CBC:  Recent Labs Lab 08/28/17 0207 08/29/17 0300 08/30/17 0321 08/31/17 0714 09/01/17 0307  WBC 12.1* 11.2* 11.1* 12.1* 10.7*  HGB 11.8* 11.2* 9.5* 9.4* 7.8*  HCT 35.4* 33.3* 28.7* 28.6* 23.6*  MCV 90.3 90.7 90.5 90.5 91.5  PLT 145* 143* 160 184 180   Basic Metabolic Panel:  Recent Labs Lab 08/27/17 0411 08/28/17 0207 08/29/17 0300 08/30/17 0321  NA 139  137 138 134* 135  K 4.6  4.4 3.9 4.1 4.0  CL 105  103 105 104 106  CO2 GLUCOSE 147*  146* 118* 102* 99  BUN CREATININE 1.18  1.19 1.12 1.12 1.06  CALCIUM 8.3*  8.2* 8.2* 8.1* 8.0*   GFR: Estimated Creatinine Clearance: 76.4 mL/min (by C-G formula based on SCr of 1.06 mg/dL). Liver Function Tests: No results for input(s): AST, ALT, ALKPHOS, BILITOT, PROT, ALBUMIN in the last 168 hours. No results for input(s): LIPASE, AMYLASE in the last 168 hours. No results for input(s): AMMONIA in the last 168 hours. Coagulation Profile: No results for input(s): INR, PROTIME in the last 168 hours. Cardiac Enzymes:  Recent Labs Lab 08/27/17 0411  CKTOTAL 175   BNP (last 3 results) No results for input(s): PROBNP in the last 8760 hours. HbA1C: No results for input(s): HGBA1C in the last 72 hours. CBG:  Recent Labs Lab 08/26/17 2105 08/27/17 0839 08/28/17 1144  GLUCAP 92 126* 97   Lipid Profile: No results for input(s): CHOL, HDL, LDLCALC, TRIG, CHOLHDL, LDLDIRECT in the last 72 hours. Thyroid Function Tests:  Recent Labs  08/30/17 0819  TSH 1.627   Anemia Panel: No results for input(s): VITAMINB12, FOLATE, FERRITIN, TIBC, IRON, RETICCTPCT in the last 72 hours. Urine analysis:    Component Value Date/Time   COLORURINE YELLOW 08/25/2017 1026   APPEARANCEUR HAZY (A) 08/25/2017 1026   LABSPEC 1.016 08/25/2017 1026   PHURINE 5.0 08/25/2017 1026   GLUCOSEU NEGATIVE 08/25/2017 1026   HGBUR LARGE (A) 08/25/2017 1026    BILIRUBINUR NEGATIVE 08/25/2017 1026   KETONESUR NEGATIVE 08/25/2017 1026   PROTEINUR 30 (A) 08/25/2017 1026   NITRITE NEGATIVE 08/25/2017 1026   LEUKOCYTESUR MODERATE (A) 08/25/2017 1026   Sepsis Labs: (procalcitonin:4,lacticidven:4)    Recent Results (from the past 240 hour(s))  Blood Culture (routine x 2)     Status:  None   Collection Time: 08/22/17  4:40 PM  Result Value Ref Range Status   Specimen Description RIGHT ANTECUBITAL  Final   Special Requests   Final    BOTTLES DRAWN AEROBIC AND ANAEROBIC Blood Culture adequate volume   Culture NO GROWTH 5 DAYS  Final   Report Status 08/27/2017 FINAL  Final  Blood Culture (routine x 2)     Status: None   Collection Time: 08/22/17  4:44 PM  Result Value Ref Range Status   Specimen Description BLOOD LEFT HAND  Final   Special Requests   Final    BOTTLES DRAWN AEROBIC AND ANAEROBIC Blood Culture adequate volume   Culture NO GROWTH 5 DAYS  Final   Report Status 08/27/2017 FINAL  Final  MRSA PCR Screening     Status: None   Collection Time: 08/23/17  1:59 AM  Result Value Ref Range Status   MRSA by PCR NEGATIVE NEGATIVE Final    Comment:        The GeneXpert MRSA Assay (FDA approved for NASAL specimens only), is one component of a comprehensive MRSA colonization surveillance program. It is not intended to diagnose MRSA infection nor to guide or monitor treatment for MRSA infections.   Urine Culture     Status: None   Collection Time: 08/25/17 10:26 AM  Result Value Ref Range Status   Specimen Description URINE, CLEAN CATCH  Final   Special Requests NONE  Final   Culture NO GROWTH  Final   Report Status 08/26/2017 FINAL  Final      Radiology Studies: No results found.   Scheduled Meds: . apixaban  5 mg Oral BID  . aspirin  325 mg Oral Daily  . atorvastatin  20 mg Oral q1800  . chlorhexidine  15 mL Mouth Rinse BID  . docusate sodium  100 mg Oral Daily  . mouth rinse  15 mL Mouth Rinse q12n4p  .  midodrine  10 mg Oral TID WC  . pantoprazole  40 mg Oral Daily   Continuous Infusions: . amiodarone Stopped (08/30/17 0730)  . magnesium sulfate 1 - 4 g bolus IVPB       LOS: 10 days   Time Spent in minutes   35 minutes Signature  Susa Raring M.D on 09/01/2017 at 10:23 AM  Between 7am to 7pm - Pager - [phone] ( page via [email or web address], text pages only, please mention full 10 digit call back number).  After 7pm go to [email or web address] - password [facility]

## 2017-09-02 LAB — CBC
HEMATOCRIT: 25.9 % — AB (ref 39.0–52.0)
HEMOGLOBIN: 8.4 g/dL — AB (ref 13.0–17.0)
MCH: 29.8 pg (ref 26.0–34.0)
MCHC: 32.4 g/dL (ref 30.0–36.0)
MCV: 91.8 fL (ref 78.0–100.0)
Platelets: 204 10*3/uL (ref 150–400)
RBC: 2.82 MIL/uL — AB (ref 4.22–5.81)
RDW: 13.8 % (ref 11.5–15.5)
WBC: 10.6 10*3/uL — ABNORMAL HIGH (ref 4.0–10.5)

## 2017-09-02 LAB — BASIC METABOLIC PANEL
Anion gap: 7 (ref 5–15)
BUN: 13 mg/dL (ref 6–20)
CHLORIDE: 104 mmol/L (ref 101–111)
CO2: 25 mmol/L (ref 22–32)
CREATININE: 1.12 mg/dL (ref 0.61–1.24)
Calcium: 8.2 mg/dL — ABNORMAL LOW (ref 8.9–10.3)
GFR calc non Af Amer: >60 mL/min (ref >60)
GLUCOSE: 97 mg/dL (ref 65–99)
Potassium: 3.8 mmol/L (ref 3.5–5.1)
Sodium: 136 mmol/L (ref 135–145)

## 2017-09-02 LAB — MAGNESIUM: Magnesium: 1.8 mg/dL (ref 1.7–2.4)

## 2017-09-02 NOTE — Progress Notes (Signed)
09/02/2017 Patient foley was removed at 11:30 am. He had 900cc of yellow clear urine out of foley. Lovie Macadamia RN

## 2017-09-02 NOTE — Progress Notes (Addendum)
PROGRESS NOTE    [name] [name]  ZOX:[id number] DOB: April 12, 1951 DOA: 08/22/2017 PCP: Patient, No Pcp Per   Chief Complaint  Patient presents with  . Altered Mental Status    Brief Narrative:   HPI on 08/22/2017 by [name]  [name] [name] is a 66 y.o. male with unknown prior medical history, per ED notes a family member found him on the floor in urine, they went by his home because he hadn't heard from him in two days. They asked the mother who was at the home how long this had been going and she also stated two days. Upon presentation the patient was completely flaccid R side, oriented name only, unable to speak and wouldn't follow commands. 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  We are asked to see for admission.   Patient unable to speak, but does weakly respond to simple questions.   Per pt's step- brother, [name], the patient has never been sick.  Walks every day, doesn't smoke or drink.  He grew up in [place], [place].  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  Never went to the doctor, never complained about anything.  He retired 5-10 yrs ago.  He doesn't take any medication that the step-brother knows of.   Their mother had a stroke last year and the patient has been looking after her. It can be stressful for the stepbrother.     Interim history  Found to have acute CVA and [facility]. Neurology consulted and following. Patient also found to have sinus bradycardia, cardiology consulted and appreciated. Patient plan for TEE with possible loop recorder on 08/28/2017. Patient noted to have absent pulses in the lower ext B/L, doppler showed total occlusions of arteries. Placed on IV heparin, vascular surgery consulted- s/p fasciotomy.Patient also developed A fib with RVR.   Subjective   Patient in bed appears comfortable, understands questions and answers with head nod or left arm movements, denies  any headache or chest pain, denies any abdominal discomfort or shortness of breath, He follow commands, answer question by gesturing    Assessment & Plan    Acute left MCA CVA  - Presented with right-sided Hemiparesis as well as expressive aphasia - imaging showed large acute left MCA territory infarc with cytotoxic edema causing a 5mm of midlinie shift. Carotid Doppler: Bilateral ICA 1-39% stenosis, vertebral artery flow antegrade, Echocardiogram shows an EF of 55%, no source of emboli. LDL 78, hemoglobin A1c 5.2. Speech therapy working with patient, currently on dysphagia 1 diet, seen by stroke team and full stroke protocol was followed, currently on Currently on aspirin, statin and now Eliquis after discussion with cardiology on 08/31/2017, pharmacy to dose. Source could have been atrial fibrillation. Will require rehabilitation upon discharge, social work consulted.   Peripheral vascular disease/ Bilateral lower extremity Ischemia with compartment syndrome - was found to have age intermediate obstructive thrombosis involving the common femoral, superficial femoral, popliteal, posterior tibial, peroneal, anterior tibial arteries bilaterally, vascular surgery was consulted patient was placed on heparin drip and now on Eliquis.   Being followed by vascular surgery and status post  Bilateral embolectomy iliac femoral popliteal, 4 compartment fasciotomy bilaterally, s/p closure of B/L leg fasciotomies on 08/28/2017. Underwent another surgery on 08/30/2017 for Right popliteal and tibial endarterectomy with patch angioplasty of right popliteal artery and tibial peroneal trunk, right leg has a drain which needs to be left in place  untill output is less than 30 mL per day. Continue anticoagulation + ASA.   Anemia. No signs of bleeding, no blood in stool or melena, check anemia panel, double his dose of PPI since he is on Eliquis and aspirin, type screen, repeat H&H in the morning.  New Atrial fibrillation  with RVR with likely underlying sick sinus syndrome [place] vasc 2 score of at least 3 -  He received amiodarone initially, he converted to sinus rhythm, currently he is sinus brady with a few pac's ( tele personally reviewed) he is started on Eliquis, patient has refused pacemaker placement, he is clinically competent to make decisions, this was also conveyed to his brother by me personally on 08/31/2017. Again confirmed with patient on 09/01/2017 and presents of his nurse [name], patient confirms that he does not want pacemaker, he is agreeable to medical treatment, wants to be DO NOT RESUSCITATE. Cardiology/EP following    Hypotension - IV fluids and midodrine, stable random TSH and cortisol.   Dysuria -UA unremarkable for infection x 2, UA did show large hemoglobin, likely foley related to trauma. This problem has resolved. Remove foley, changed to condom cathter    Dehydration/hypernatremia, with prerenal Azotemia, mild rhabdomyolysis.  All resolved with hydration.  Hyperlipidemia -started on statin       DVT Prophylaxis   Eliquis  Code Status: DNR  Family Communication: called and updated brother on 08/31/2017  Disposition Plan: Continue to monitor in stepdown. SNF placement with cardiology/EP/vascular clearance  Consultants Neurology Cardiology Vascular surgery Inpatient rehab  Procedures  Echocardiogram Lower extremity doppler Lower ext 4 compartment fasciotomy bilaterally Closure of LE fasciotomies  Right popliteal and tibial endarterectomy with patch angioplasty of right popliteal artery and tibial peroneal trunk  Antibiotics   Anti-infectives    Start     Dose/Rate Route Frequency Ordered Stop   08/30/17 1600  cefUROXime (ZINACEF) 1.5 g in dextrose 5 % 50 mL IVPB     1.5 g 100 mL/hr over 30 Minutes Intravenous On call to O.R. 08/30/17 0835 08/30/17 1511   08/28/17 0600  cefUROXime (ZINACEF) 1.5 g in dextrose 5 % 50 mL IVPB     1.5 g 100 mL/hr over 30 Minutes  Intravenous On call to O.R. 08/27/17 1502 08/29/17 0323   08/27/17 0500  cefUROXime (ZINACEF) 1.5 g in dextrose 5 % 50 mL IVPB     1.5 g 100 mL/hr over 30 Minutes Intravenous Every 12 hours 08/26/17 2132 08/27/17 1753   08/26/17 1600  cefUROXime (ZINACEF) 1.5 g in dextrose 5 % 50 mL IVPB     1.5 g 100 mL/hr over 30 Minutes Intravenous To Surgery 08/26/17 1543 08/27/17 0502   08/22/17 1715  vancomycin (VANCOCIN) IVPB 1000 mg/200 mL premix     1,000 mg 200 mL/hr over 60 Minutes Intravenous  Once 08/22/17 1710 08/22/17 1935   08/22/17 1715  piperacillin-tazobactam (ZOSYN) IVPB 3.375 g     3.375 g 100 mL/hr over 30 Minutes Intravenous  Once 08/22/17 1710 08/22/17 1838      Objective:   Vitals:   09/02/17 0700 09/02/17 0752 09/02/17 0800 09/02/17 1124  BP: 91/64 108/71 102/60   Pulse: (!) 42 (!) 48 (!) 45   Resp: (!) Temp:    (!) 97.4 F (36.3 C)  TempSrc:    Oral  SpO2: 98%  100%   Weight:      Height:        Intake/Output Summary (Last 24 hours) at 09/02/17  1451 Last data filed at 09/02/17 0700  Gross per 24 hour  Intake              240 ml  Output             1800 ml  Net            -1560 ml   Filed Weights   08/23/17 0500 08/23/17 2200 08/30/17 1419  Weight: 78.5 kg (173 lb 1 oz) 80.9 kg (178 lb 5.6 oz) 80.7 kg (178 lb)    Exam  Awake Alert, Oriented X 3, No new F.N deficits, Normal affect, Dense right-sided hemiparesis with expressive aphasia [place].AT,PERRAL Supple Neck,No JVD, No cervical lymphadenopathy appriciated.  Symmetrical Chest wall movement, Good air movement bilaterally, CTAB RRR,No Gallops,Rubs or new Murmurs, No Parasternal Heave +ve B.Sounds, Abd Soft, No tenderness, No organomegaly appriciated, No rebound - guarding or rigidity. No Cyanosis, Clubbing or edema, No new Rash or bruise Fasciotomy scar stable in both lower extremities with staples in place. Drain in Rt leg   Data Reviewed: I have personally reviewed following labs and imaging  studies  CBC:  Recent Labs Lab 08/29/17 0300 08/30/17 0321 08/31/17 0714 09/01/17 0307 09/02/17 0352  WBC 11.2* 11.1* 12.1* 10.7* 10.6*  HGB 11.2* 9.5* 9.4* 7.8* 8.4*  HCT 33.3* 28.7* 28.6* 23.6* 25.9*  MCV 90.7 90.5 90.5 91.5 91.8  PLT 143* 160 184 180 204   Basic Metabolic Panel:  Recent Labs Lab 08/27/17 0411 08/28/17 0207 08/29/17 0300 08/30/17 0321 09/02/17 0352  NA 139  137 138 134* 135 136  K 4.6  4.4 3.9 4.1 4.0 3.8  CL 105  103 105 104 106 104  CO2 GLUCOSE 147*  146* 118* 102* 99 97  BUN CREATININE 1.18  1.19 1.12 1.12 1.06 1.12  CALCIUM 8.3*  8.2* 8.2* 8.1* 8.0* 8.2*  MG  --   --   --   --  1.8   GFR: Estimated Creatinine Clearance: 72.3 mL/min (by C-G formula based on SCr of 1.12 mg/dL). Liver Function Tests: No results for input(s): AST, ALT, ALKPHOS, BILITOT, PROT, ALBUMIN in the last 168 hours. No results for input(s): LIPASE, AMYLASE in the last 168 hours. No results for input(s): AMMONIA in the last 168 hours. Coagulation Profile: No results for input(s): INR, PROTIME in the last 168 hours. Cardiac Enzymes:  Recent Labs Lab 08/27/17 0411  CKTOTAL 175   BNP (last 3 results) No results for input(s): PROBNP in the last 8760 hours. HbA1C: No results for input(s): HGBA1C in the last 72 hours. CBG:  Recent Labs Lab 08/26/17 2105 08/27/17 0839 08/28/17 1144  GLUCAP 92 126* 97   Lipid Profile: No results for input(s): CHOL, HDL, LDLCALC, TRIG, CHOLHDL, LDLDIRECT in the last 72 hours. Thyroid Function Tests: No results for input(s): TSH, T4TOTAL, FREET4, T3FREE, THYROIDAB in the last 72 hours. Anemia Panel:  Recent Labs  09/01/17 1227  VITAMINB12 644  FOLATE 9.7  FERRITIN 517*  TIBC 192*  IRON 39*  RETICCTPCT 3.4*   Urine analysis:    Component Value Date/Time   COLORURINE YELLOW 08/25/2017 1026   APPEARANCEUR HAZY (A) 08/25/2017 1026   LABSPEC 1.016 08/25/2017 1026   PHURINE  5.0 08/25/2017 1026   GLUCOSEU NEGATIVE 08/25/2017 1026   HGBUR LARGE (A) 08/25/2017 1026   BILIRUBINUR NEGATIVE 08/25/2017 1026   KETONESUR NEGATIVE 08/25/2017 1026   PROTEINUR 30 (A)  08/25/2017 1026   NITRITE NEGATIVE 08/25/2017 1026   LEUKOCYTESUR MODERATE (A) 08/25/2017 1026   Sepsis Labs: (procalcitonin:4,lacticidven:4)    Recent Results (from the past 240 hour(s))  Urine Culture     Status: None   Collection Time: 08/25/17 10:26 AM  Result Value Ref Range Status   Specimen Description URINE, CLEAN CATCH  Final   Special Requests NONE  Final   Culture NO GROWTH  Final   Report Status 08/26/2017 FINAL  Final      Radiology Studies: No results found.   Scheduled Meds: . apixaban  5 mg Oral BID  . aspirin  325 mg Oral Daily  . atorvastatin  20 mg Oral q1800  . chlorhexidine  15 mL Mouth Rinse BID  . docusate sodium  100 mg Oral Daily  . mouth rinse  15 mL Mouth Rinse q12n4p  . midodrine  10 mg Oral TID WC  . pantoprazole  40 mg Oral BID   Continuous Infusions: . amiodarone Stopped (08/30/17 0730)  . magnesium sulfate 1 - 4 g bolus IVPB       LOS: 11 days   Time Spent in minutes   25 minutes Signature  Xanthe Couillard M.D on 09/02/2017 at 2:51 PM  Between 7am to 7pm - Pager - [phone] ( page via [email or web address], text pages only, please mention full 10 digit call back number).  After 7pm go to [email or web address] - password [email or web address]

## 2017-09-02 NOTE — Progress Notes (Signed)
Progress Note  Patient Name: [name] [name] Date of Encounter: 09/02/2017  Primary Cardiologist: new patient   Patient Profile     66 y.o. male seen 9/6 for bradycardia--responsive to movement Admitted 9/4 following large L MCA stroke 9/5 Echo normal LV function  Course cx by LE embolectomy/ age indeterminate thrombotic disease requiring surgery x 2 ; Afib RVR Rx with Amio.  Terminated with recurrent bradycardia and hypotension Now mitodrine aphasic  Subjective   He denies chest pain and shortness of breath  APHASIC  Inpatient Medications    Scheduled Meds: . apixaban  5 mg Oral BID  . aspirin  325 mg Oral Daily  . atorvastatin  20 mg Oral q1800  . chlorhexidine  15 mL Mouth Rinse BID  . docusate sodium  100 mg Oral Daily  . mouth rinse  15 mL Mouth Rinse q12n4p  . midodrine  10 mg Oral TID WC  . pantoprazole  40 mg Oral BID   Continuous Infusions: . amiodarone Stopped (08/30/17 0730)  . magnesium sulfate 1 - 4 g bolus IVPB     PRN Meds: acetaminophen **OR** acetaminophen, acetaminophen, alum & mag hydroxide-simeth, guaiFENesin-dextromethorphan, hydrALAZINE, labetalol, magnesium sulfate 1 - 4 g bolus IVPB, metoprolol tartrate, ondansetron, phenol, potassium chloride, RESOURCE THICKENUP CLEAR, senna-docusate   Vital Signs    Vitals:   09/02/17 0600 09/02/17 0700 09/02/17 0752 09/02/17 0800  BP: (!) 108/58 91/64 108/71 102/60  Pulse: (!) 45 (!) 42 (!) 48 (!) 45  Resp: (!) 9 (!) Temp:      TempSrc:      SpO2: 100% 98%  100%  Weight:      Height:        Intake/Output Summary (Last 24 hours) at 09/02/17 1107 Last data filed at 09/02/17 0700  Gross per 24 hour  Intake              240 ml  Output             1800 ml  Net            -1560 ml   Filed Weights   08/23/17 0500 08/23/17 2200 08/30/17 1419  Weight: 173 lb 1 oz (78.5 kg) 178 lb 5.6 oz (80.9 kg) 178 lb (80.7 kg)   Telemetry    SINUS WITH PACs  Personally Reviewed  Physical Exam    Well developed and nourished in no acute distress HENT normal Neck supple with JVP-flat Clear Irregular rate and rhythm, no murmurs or gallops Abd-soft with active BS No Clubbing cyanosis edema Skin-warm and dry A & Oriented dense R hemiplegia.aphasic   Labs    Chemistry  Recent Labs Lab 08/29/17 0300 08/30/17 0321 09/02/17 0352  NA 134* 135 136  K 4.1 4.0 3.8  CL 104 106 104  CO2 GLUCOSE 102* 99 97  BUN CREATININE 1.12 1.06 1.12  CALCIUM 8.1* 8.0* 8.2*  GFRNONAA >60 >60 >60  GFRAA >60 >60 >60  ANIONGAP 5 4* 7     Hematology  Recent Labs Lab 08/31/17 0714 09/01/17 0307 09/01/17 1227 09/02/17 0352  WBC 12.1* 10.7*  --  10.6*  RBC 3.16* 2.58* 2.69* 2.82*  HGB 9.4* 7.8*  --  8.4*  HCT 28.6* 23.6*  --  25.9*  MCV 90.5 91.5  --  91.8  MCH 29.7 30.2  --  29.8  MCHC 32.9 33.1  --  32.4  RDW 13.1 13.5  --  13.8  PLT 184 180  --  204   Cardiac Enzymes No results for input(s): TROPONINI in the last 168 hours.  No results for input(s): TROPIPOC in the last 168 hours.  BNPNo results for input(s): BNP, PROBNP in the last 168 hours.   DDimer No results for input(s): DDIMER in the last 168 hours.   Radiology    No results found.  Cardiac Studies   TTE: 09/02/17 - Left ventricle: The cavity size was normal. Wall thickness was   normal. Systolic function was normal. The estimated ejection   fraction was 55%. Wall motion was normal; there were no regional   wall motion abnormalities. Left ventricular diastolic function   parameters were normal. - Aortic valve: Trileaflet; mildly thickened leaflets. There was   trivial regurgitation. - Aorta: Mild aortic root dilatation. Aortic root dimension: 39 mm   (ED). - Mitral valve: There was mild regurgitation. - Left atrium: The atrium was mildly dilated. - Atrial septum: No defect or patent foramen ovale was identified. - Pulmonic valve: There was mild regurgitation.     Assessment & Plan     Tachybrady syndrome   Follow bradycardia now-- expect recurrent atrial fib and will likely be fast  Atrial fib Paroxysmal  Peripheral vascular disease with bilateral embolectomies, extensive popliteal tibial SFA embolus,  s/p Right popliteal and tibial endarterectomy with patch angioplasty of right popliteal artery and  tibial peroneal trunk.  Chronic thrombotic disease  Left brain stroke   On apixoban and ASA for PVD and afib   For questions or updates, please contact [facility] Please consult [email or web address] for contact info under Cardiology/STEMI. Daytime calls, contact the Day Call APP (6a-8a) or assigned team (Teams A-D) provider (7:30a - 5p). All other daytime calls (7:30-5p), contact the Card Master @ [phone].   Nighttime calls, contact the assigned APP (5p-8p) or MD (6:30p-8p). Overnight calls (8p-6a), contact the on call Fellow @ [phone].   Signed, Sherryl Manges, MD  09/02/2017, 11:07 AM

## 2017-09-03 LAB — BPAM RBC
BLOOD PRODUCT EXPIRATION DATE: 201809172359
BLOOD PRODUCT EXPIRATION DATE: 201809182359
BLOOD PRODUCT EXPIRATION DATE: 201810022359
ISSUE DATE / TIME: 201809111347
ISSUE DATE / TIME: 201809131557
UNIT TYPE AND RH: 6200
Unit Type and Rh: 600
Unit Type and Rh: 600

## 2017-09-03 LAB — CBC
HEMATOCRIT: 26.7 % — AB (ref 39.0–52.0)
Hemoglobin: 8.9 g/dL — ABNORMAL LOW (ref 13.0–17.0)
MCH: 30.4 pg (ref 26.0–34.0)
MCHC: 33.3 g/dL (ref 30.0–36.0)
MCV: 91.1 fL (ref 78.0–100.0)
Platelets: 230 10*3/uL (ref 150–400)
RBC: 2.93 MIL/uL — AB (ref 4.22–5.81)
RDW: 13.5 % (ref 11.5–15.5)
WBC: 11.7 10*3/uL — AB (ref 4.0–10.5)

## 2017-09-03 LAB — BASIC METABOLIC PANEL
Anion gap: 6 (ref 5–15)
BUN: 15 mg/dL (ref 6–20)
CHLORIDE: 102 mmol/L (ref 101–111)
CO2: 26 mmol/L (ref 22–32)
CREATININE: 1.16 mg/dL (ref 0.61–1.24)
Calcium: 8.5 mg/dL — ABNORMAL LOW (ref 8.9–10.3)
GFR calc non Af Amer: >60 mL/min (ref >60)
Glucose, Bld: 91 mg/dL (ref 65–99)
POTASSIUM: 3.9 mmol/L (ref 3.5–5.1)
Sodium: 134 mmol/L — ABNORMAL LOW (ref 135–145)

## 2017-09-03 LAB — TYPE AND SCREEN
ABO/RH(D): A POS
Antibody Screen: NEGATIVE
UNIT DIVISION: 0
UNIT DIVISION: 0
Unit division: 0

## 2017-09-03 LAB — MAGNESIUM: Magnesium: 1.9 mg/dL (ref 1.7–2.4)

## 2017-09-03 NOTE — Progress Notes (Addendum)
PROGRESS NOTE    [name] [name]  AVW:[id number] DOB: Oct 29, 1951 DOA: 08/22/2017 PCP: Patient, No Pcp Per   Chief Complaint  Patient presents with  . Altered Mental Status    Brief Narrative:   HPI on 08/22/2017 by [name]  [name] [name] is a 66 y.o. male with unknown prior medical history, per ED notes a family member found him on the floor in urine, they went by his home because he hadn't heard from him in two days. They asked the mother who was at the home how long this had been going and she also stated two days. Upon presentation the patient was completely flaccid R side, oriented name only, unable to speak and wouldn't follow commands. 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  We are asked to see for admission.   Patient unable to speak, but does weakly respond to simple questions.   Per pt's step- brother, [name] [name], the patient has never been sick.  Walks every day, doesn't smoke or drink.  He grew up in [place], [place].  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  Never went to the doctor, never complained about anything.  He retired 5-10 yrs ago.  He doesn't take any medication that the step-brother knows of.   Their mother had a stroke last year and the patient has been looking after her. It can be stressful for the stepbrother.     Interim history  Found to have acute CVA and transferred to [facility] from [facility]. Neurology consulted and following. Patient also found to have sinus bradycardia, cardiology consulted and appreciated. Patient plan for TEE with possible loop recorder on 08/28/2017. Patient noted to have absent pulses in the lower ext B/L, doppler showed total occlusions of arteries. Placed on IV heparin, vascular surgery consulted- s/p fasciotomy.Patient also developed A fib with RVR.   Subjective   Patient in bed appears comfortable, he remain to have expressive aphasia, aphonia, He understands questions and  answers with head nod or left arm movements, denies any headache or chest pain, denies any abdominal discomfort or shortness of breath,    Assessment & Plan    Acute left MCA CVA  - Presented with right-sided Hemiparesis as well as expressive aphasia - imaging showed large acute left MCA territory infarc with cytotoxic edema causing a 5mm of midlinie shift. Carotid Doppler: Bilateral ICA 1-39% stenosis, vertebral artery flow antegrade, Echocardiogram shows an EF of 55%, no source of emboli. LDL 78, hemoglobin A1c 5.2. Speech therapy working with patient, currently on dysphagia 1 diet, seen by stroke team and full stroke protocol was followed, currently on Currently on aspirin, statin and now Eliquis after discussion with cardiology on 08/31/2017, pharmacy to dose. Source could have been atrial fibrillation. Awaiting snf placement once cleared by cardiology and vascular surgery.   Peripheral vascular disease/ Bilateral lower extremity Ischemia with compartment syndrome - was found to have age intermediate obstructive thrombosis involving the common femoral, superficial femoral, popliteal, posterior tibial, peroneal, anterior tibial arteries bilaterally, vascular surgery was consulted patient was placed on heparin drip and now on Eliquis.   Being followed by vascular surgery and status post  Bilateral embolectomy iliac femoral popliteal, 4 compartment fasciotomy bilaterally, s/p closure of B/L leg fasciotomies on 08/28/2017. Underwent another surgery on 08/30/2017 for Right popliteal and tibial endarterectomy with patch angioplasty of right popliteal artery and tibial peroneal trunk, leg drain removed on 9/15. Continue anticoagulation + ASA.  Anemia. No signs of bleeding, no blood in stool or melena, check anemia panel, double his dose of PPI since he is on Eliquis and aspirin, type screen, repeat H&H in the morning.  New Atrial fibrillation with RVR with likely underlying sick sinus syndrome [place] vasc  2 score of at least 3 -  He received amiodarone initially, he converted to sinus rhythm, currently he is sinus brady with a few pac's ( tele personally reviewed) he is started on Eliquis, patient has refused pacemaker placement, he is clinically competent to make decisions, this was also conveyed to his brother by me personally on 08/31/2017. Again confirmed with patient on 09/01/2017 and presents of his nurse [name], patient confirms that he does not want pacemaker, he is agreeable to medical treatment, wants to be DO NOT RESUSCITATE. Cardiology/EP following    Hypotension - IV fluids and midodrine, stable random TSH and cortisol.   Dysuria -UA unremarkable for infection x 2, UA did show large hemoglobin, likely foley related to trauma. This problem has resolved. Remove foley, changed to condom cathter    Dehydration/hypernatremia, with prerenal Azotemia, mild rhabdomyolysis.  All resolved with hydration.  Hyperlipidemia -started on statin       DVT Prophylaxis   Eliquis  Code Status: DNR  Family Communication: called and updated brother on 08/31/2017  Disposition Plan: Continue to monitor in stepdown. SNF placement with cardiology/vascular clearance  Consultants Neurology Cardiology Vascular surgery Inpatient rehab  Procedures  Echocardiogram Lower extremity doppler Lower ext 4 compartment fasciotomy bilaterally Closure of LE fasciotomies  Right popliteal and tibial endarterectomy with patch angioplasty of right popliteal artery and tibial peroneal trunk  Antibiotics   Anti-infectives    Start     Dose/Rate Route Frequency Ordered Stop   08/30/17 1600  cefUROXime (ZINACEF) 1.5 g in dextrose 5 % 50 mL IVPB     1.5 g 100 mL/hr over 30 Minutes Intravenous On call to O.R. 08/30/17 0835 08/30/17 1511   08/28/17 0600  cefUROXime (ZINACEF) 1.5 g in dextrose 5 % 50 mL IVPB     1.5 g 100 mL/hr over 30 Minutes Intravenous On call to O.R. 08/27/17 1502 08/29/17 0323   08/27/17  0500  cefUROXime (ZINACEF) 1.5 g in dextrose 5 % 50 mL IVPB     1.5 g 100 mL/hr over 30 Minutes Intravenous Every 12 hours 08/26/17 2132 08/27/17 1753   08/26/17 1600  cefUROXime (ZINACEF) 1.5 g in dextrose 5 % 50 mL IVPB     1.5 g 100 mL/hr over 30 Minutes Intravenous To Surgery 08/26/17 1543 08/27/17 0502   08/22/17 1715  vancomycin (VANCOCIN) IVPB 1000 mg/200 mL premix     1,000 mg 200 mL/hr over 60 Minutes Intravenous  Once 08/22/17 1710 08/22/17 1935   08/22/17 1715  piperacillin-tazobactam (ZOSYN) IVPB 3.375 g     3.375 g 100 mL/hr over 30 Minutes Intravenous  Once 08/22/17 1710 08/22/17 1838      Objective:   Vitals:   09/03/17 0400 09/03/17 0500 09/03/17 0600 09/03/17 0739  BP: (!) 99/58 (!) 103/58 97/65   Pulse: (!) 51 (!) 47 (!) 56   Resp: Temp:    98.1 F (36.7 C)  TempSrc:    Axillary  SpO2: 95% 95% 96%   Weight:      Height:        Intake/Output Summary (Last 24 hours) at 09/03/17 1028 Last data filed at 09/03/17 0902  Gross per 24 hour  Intake  240 ml  Output             2000 ml  Net            -1760 ml   Filed Weights   08/23/17 0500 08/23/17 2200 08/30/17 1419  Weight: 78.5 kg (173 lb 1 oz) 80.9 kg (178 lb 5.6 oz) 80.7 kg (178 lb)    Exam  Awake Alert, Oriented X 3, No new F.N deficits, Normal affect, Dense right-sided hemiparesis with expressive aphasia, aphonia Arial.AT,PERRAL Supple Neck,No JVD, No cervical lymphadenopathy appriciated.  Symmetrical Chest wall movement, Good air movement bilaterally, CTAB RRR,No Gallops,Rubs or new Murmurs, No Parasternal Heave +ve B.Sounds, Abd Soft, No tenderness, No organomegaly appriciated, No rebound - guarding or rigidity. No Cyanosis, Clubbing or edema, No new Rash or bruise Fasciotomy scar stable in both lower extremities with staples in place. Drain in Rt leg   Data Reviewed: I have personally reviewed following labs and imaging studies  CBC:  Recent Labs Lab 08/30/17 0321  08/31/17 0714 09/01/17 0307 09/02/17 0352 09/03/17 0457  WBC 11.1* 12.1* 10.7* 10.6* 11.7*  HGB 9.5* 9.4* 7.8* 8.4* 8.9*  HCT 28.7* 28.6* 23.6* 25.9* 26.7*  MCV 90.5 90.5 91.5 91.8 91.1  PLT 160 184 180 204 230   Basic Metabolic Panel:  Recent Labs Lab 08/28/17 0207 08/29/17 0300 08/30/17 0321 09/02/17 0352 09/03/17 0457  NA 138 134* 135 136 134*  K 3.9 4.1 4.0 3.8 3.9  CL 105 104 106 104 102  CO2 GLUCOSE 118* 102* 99 97 91  BUN CREATININE 1.12 1.12 1.06 1.12 1.16  CALCIUM 8.2* 8.1* 8.0* 8.2* 8.5*  MG  --   --   --  1.8 1.9   GFR: Estimated Creatinine Clearance: 69.8 mL/min (by C-G formula based on SCr of 1.16 mg/dL). Liver Function Tests: No results for input(s): AST, ALT, ALKPHOS, BILITOT, PROT, ALBUMIN in the last 168 hours. No results for input(s): LIPASE, AMYLASE in the last 168 hours. No results for input(s): AMMONIA in the last 168 hours. Coagulation Profile: No results for input(s): INR, PROTIME in the last 168 hours. Cardiac Enzymes: No results for input(s): CKTOTAL, CKMB, CKMBINDEX, TROPONINI in the last 168 hours. BNP (last 3 results) No results for input(s): PROBNP in the last 8760 hours. HbA1C: No results for input(s): HGBA1C in the last 72 hours. CBG:  Recent Labs Lab 08/28/17 1144  GLUCAP 97   Lipid Profile: No results for input(s): CHOL, HDL, LDLCALC, TRIG, CHOLHDL, LDLDIRECT in the last 72 hours. Thyroid Function Tests: No results for input(s): TSH, T4TOTAL, FREET4, T3FREE, THYROIDAB in the last 72 hours. Anemia Panel:  Recent Labs  09/01/17 1227  VITAMINB12 644  FOLATE 9.7  FERRITIN 517*  TIBC 192*  IRON 39*  RETICCTPCT 3.4*   Urine analysis:    Component Value Date/Time   COLORURINE YELLOW 08/25/2017 1026   APPEARANCEUR HAZY (A) 08/25/2017 1026   LABSPEC 1.016 08/25/2017 1026   PHURINE 5.0 08/25/2017 1026   GLUCOSEU NEGATIVE 08/25/2017 1026   HGBUR LARGE (A) 08/25/2017 1026   BILIRUBINUR  NEGATIVE 08/25/2017 1026   KETONESUR NEGATIVE 08/25/2017 1026   PROTEINUR 30 (A) 08/25/2017 1026   NITRITE NEGATIVE 08/25/2017 1026   LEUKOCYTESUR MODERATE (A) 08/25/2017 1026   Sepsis Labs: (procalcitonin:4,lacticidven:4)    Recent Results (from the past 240 hour(s))  Urine Culture     Status: None   Collection Time: 08/25/17 10:26 AM  Result  Value Ref Range Status   Specimen Description URINE, CLEAN CATCH  Final   Special Requests NONE  Final   Culture NO GROWTH  Final   Report Status 08/26/2017 FINAL  Final      Radiology Studies: No results found.   Scheduled Meds: . apixaban  5 mg Oral BID  . aspirin  325 mg Oral Daily  . atorvastatin  20 mg Oral q1800  . chlorhexidine  15 mL Mouth Rinse BID  . docusate sodium  100 mg Oral Daily  . mouth rinse  15 mL Mouth Rinse q12n4p  . midodrine  10 mg Oral TID WC  . pantoprazole  40 mg Oral BID   Continuous Infusions: . amiodarone Stopped (08/30/17 0730)  . magnesium sulfate 1 - 4 g bolus IVPB       LOS: 12 days   Time Spent in minutes   15 minutes Signature  Isabelle Matt M.D PhD on 09/03/2017 at 10:28 AM  Between 7am to 7pm - Pager - [phone] ( page via [email or web address], text pages only, please mention full 10 digit call back number).  After 7pm go to [email or web address] - password [facility]

## 2017-09-03 NOTE — Progress Notes (Signed)
Progress Note  Patient Name: [name] [name] Date of Encounter: 09/03/2017  Primary Cardiologist: new patient   Patient Profile     66 y.o. male seen 9/6 for bradycardia--responsive to movement Admitted 9/4 following large L MCA stroke 9/5 Echo normal LV function  Course cx by LE embolectomy/ age indeterminate thrombotic disease requiring surgery x 2 ; Afib RVR Rx with Amio.  Terminated with recurrent bradycardia and hypotension Now mitodrine aphasic  Subjective   Without complaint ofsob or cp APHASIC    Inpatient Medications    Scheduled Meds: . apixaban  5 mg Oral BID  . aspirin  325 mg Oral Daily  . atorvastatin  20 mg Oral q1800  . chlorhexidine  15 mL Mouth Rinse BID  . docusate sodium  100 mg Oral Daily  . mouth rinse  15 mL Mouth Rinse q12n4p  . midodrine  10 mg Oral TID WC  . pantoprazole  40 mg Oral BID   Continuous Infusions: . amiodarone Stopped (08/30/17 0730)  . magnesium sulfate 1 - 4 g bolus IVPB     PRN Meds: acetaminophen **OR** acetaminophen, acetaminophen, alum & mag hydroxide-simeth, guaiFENesin-dextromethorphan, hydrALAZINE, labetalol, magnesium sulfate 1 - 4 g bolus IVPB, metoprolol tartrate, ondansetron, phenol, potassium chloride, RESOURCE THICKENUP CLEAR, senna-docusate   Vital Signs    Vitals:   09/03/17 0400 09/03/17 0500 09/03/17 0600 09/03/17 0739  BP: (!) 99/58 (!) 103/58 97/65   Pulse: (!) 51 (!) 47 (!) 56   Resp: Temp:    98.1 F (36.7 C)  TempSrc:    Axillary  SpO2: 95% 95% 96%   Weight:      Height:        Intake/Output Summary (Last 24 hours) at 09/03/17 1057 Last data filed at 09/03/17 0902  Gross per 24 hour  Intake              240 ml  Output             2000 ml  Net            -1760 ml   Filed Weights   08/23/17 0500 08/23/17 2200 08/30/17 1419  Weight: 173 lb 1 oz (78.5 kg) 178 lb 5.6 oz (80.9 kg) 178 lb (80.7 kg)   Telemetry    SINUS w freq PACs  Personally Reviewed  Physical Exam  Well  developed and nourished in no acute distress HENT normal Neck supple with JVP-flat Carotids brisk and full without bruits Clear Regular rate and rhythm, no murmurs or gallops Abd-soft with active BS without hepatomegaly No Clubbing cyanosis edema Skin-warm and dry A & Oriented  DENSE R hemiparesis  Labs    Chemistry  Recent Labs Lab 08/30/17 0321 09/02/17 0352 09/03/17 0457  NA 135 136 134*  K 4.0 3.8 3.9  CL 106 104 102  CO2 GLUCOSE 99 97 91  BUN CREATININE 1.06 1.12 1.16  CALCIUM 8.0* 8.2* 8.5*  GFRNONAA >60 >60 >60  GFRAA >60 >60 >60  ANIONGAP 4* 7 6     Hematology  Recent Labs Lab 09/01/17 0307 09/01/17 1227 09/02/17 0352 09/03/17 0457  WBC 10.7*  --  10.6* 11.7*  RBC 2.58* 2.69* 2.82* 2.93*  HGB 7.8*  --  8.4* 8.9*  HCT 23.6*  --  25.9* 26.7*  MCV 91.5  --  91.8 91.1  MCH 30.2  --  29.8 30.4  MCHC 33.1  --  32.4 33.3  RDW 13.5  --  13.8 13.5  PLT 180  --  204 230   Cardiac Enzymes No results for input(s): TROPONINI in the last 168 hours.  No results for input(s): TROPIPOC in the last 168 hours.  BNPNo results for input(s): BNP, PROBNP in the last 168 hours.   DDimer No results for input(s): DDIMER in the last 168 hours.   Radiology    No results found.  Cardiac Studies   TTE: 09/02/17 - Left ventricle: The cavity size was normal. Wall thickness was   normal. Systolic function was normal. The estimated ejection   fraction was 55%. Wall motion was normal; there were no regional   wall motion abnormalities. Left ventricular diastolic function   parameters were normal. - Aortic valve: Trileaflet; mildly thickened leaflets. There was   trivial regurgitation. - Aorta: Mild aortic root dilatation. Aortic root dimension: 39 mm   (ED). - Mitral valve: There was mild regurgitation. - Left atrium: The atrium was mildly dilated. - Atrial septum: No defect or patent foramen ovale was identified. - Pulmonic valve: There was mild  regurgitation.     Assessment & Plan    Tachybrady syndrome    f   Atrial fib Paroxysmal  Peripheral vascular disease with bilateral embolectomies, extensive popliteal tibial SFA embolus,  s/p Right popliteal and tibial endarterectomy with patch angioplasty of right popliteal artery and  tibial peroneal trunk.  Chronic thrombotic disease  Left brain stroke   On apixoban and ASA for PVD and afib  \ Holding sinus  Continue current meds   For questions or updates, please contact [facility] Please consult [email or web address] for contact info under Cardiology/STEMI. Daytime calls, contact the Day Call APP (6a-8a) or assigned team (Teams A-D) provider (7:30a - 5p). All other daytime calls (7:30-5p), contact the Card Master @ [phone].   Nighttime calls, contact the assigned APP (5p-8p) or MD (6:30p-8p). Overnight calls (8p-6a), contact the on call Fellow @ [phone].   Signed, Sherryl Manges, MD  09/03/2017, 10:57 AM

## 2017-09-04 LAB — CBC
HEMATOCRIT: 26.6 % — AB (ref 39.0–52.0)
Hemoglobin: 8.9 g/dL — ABNORMAL LOW (ref 13.0–17.0)
MCH: 30.9 pg (ref 26.0–34.0)
MCHC: 33.5 g/dL (ref 30.0–36.0)
MCV: 92.4 fL (ref 78.0–100.0)
PLATELETS: 279 10*3/uL (ref 150–400)
RBC: 2.88 MIL/uL — AB (ref 4.22–5.81)
RDW: 14.1 % (ref 11.5–15.5)
WBC: 11.5 10*3/uL — ABNORMAL HIGH (ref 4.0–10.5)

## 2017-09-04 LAB — BASIC METABOLIC PANEL
ANION GAP: 7 (ref 5–15)
BUN: 16 mg/dL (ref 6–20)
CHLORIDE: 101 mmol/L (ref 101–111)
CO2: 28 mmol/L (ref 22–32)
Calcium: 8.4 mg/dL — ABNORMAL LOW (ref 8.9–10.3)
Creatinine, Ser: 1.23 mg/dL (ref 0.61–1.24)
GFR calc Af Amer: >60 mL/min (ref >60)
GFR, EST NON AFRICAN AMERICAN: 59 mL/min — AB (ref >60)
Glucose, Bld: 91 mg/dL (ref 65–99)
POTASSIUM: 4.1 mmol/L (ref 3.5–5.1)
Sodium: 136 mmol/L (ref 135–145)

## 2017-09-04 MED ORDER — PANTOPRAZOLE SODIUM 40 MG PO PACK
40.0000 mg | PACK | Freq: Two times a day (BID) | ORAL | Status: DC
  Administered 2017-09-04 – 2017-09-05 (×2): 40 mg via ORAL
  Filled 2017-09-04 (×2): qty 20

## 2017-09-04 NOTE — Progress Notes (Signed)
  Speech Language Pathology Treatment: Dysphagia;Cognitive-Linquistic  Patient Details Name: [name] [name] MRN: [id number] DOB: [birth year] Today's Date: 09/04/2017 Time: 4098-1191 SLP Time Calculation (min) (ACUTE ONLY): 18 min  Assessment / Plan / Recommendation Clinical Impression  Pt self-fed trials of Dys 2-like textures with only Min cues needed today for pacing and clearance of R-sided pocketing. No overt s/s of aspiration were noted today. He remains aphasic and apraxic, with no phonation elicited despite Max cues. He used gestures x1 functionally and appropriately. He tried some writing today, not able to write the answers to questions even of simple, biographical answers (like "what is your name?"), but when instructed to write "[name]" he wrote it correctly. Will continue to follow.   HPI HPI: Ptis a 66 [facility] large subacute MCA infarct and 5 mm midline shift. He was transferred to [facility] out of concern for swelling. No significant PMH is known.      SLP Plan  Continue with current plan of care       Recommendations  Diet recommendations: Dysphagia 2 (fine chop);Nectar-thick liquid Liquids provided via: Straw Medication Administration: Crushed with puree Supervision: Patient able to self feed;Full supervision/cueing for compensatory strategies Compensations: Slow rate;Small sips/bites;Lingual sweep for clearance of pocketing;Monitor for anterior loss Postural Changes and/or Swallow Maneuvers: Seated upright 90 degrees;Upright 30-60 min after meal                Oral Care Recommendations: Oral care BID Follow up Recommendations: Inpatient Rehab SLP Visit Diagnosis: Dysphagia, oropharyngeal phase (R13.12);Aphasia (R47.01) Plan: Continue with current plan of care       GO                Maxcine Ham 09/04/2017, 12:46 PM  Maxcine Ham, M.A. CCC-SLP [phone]

## 2017-09-04 NOTE — Progress Notes (Signed)
PROGRESS NOTE    [name] [name]  ZOX:[id number] DOB: Mar 07, 1951 DOA: 08/22/2017 PCP: Patient, No Pcp Per   Chief Complaint  Patient presents with  . Altered Mental Status    Brief Narrative:   HPI on 08/22/2017 by [name]  MECHEL [name] is a 66 y.o. male with unknown prior medical history, per ED notes a family member found him on the floor in urine, they went by his home because he hadn't heard from him in two days. They asked the mother who was at the home how long this had been going and she also stated two days. Upon presentation the patient was completely flaccid R side, oriented name only, unable to speak and wouldn't follow commands. 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  We are asked to see for admission.   Patient unable to speak, but does weakly respond to simple questions.   Per pt's step- brother, [name], the patient has never been sick.  Walks every day, doesn't smoke or drink.  He grew up in [place], [place].  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  Never went to the doctor, never complained about anything.  He retired 5-10 yrs ago.  He doesn't take any medication that the step-brother knows of.   Their mother had a stroke last year and the patient has been looking after her. It can be stressful for the stepbrother.     Interim history  Found to have acute CVA and transferred to [facility] from [facility]. Neurology consulted and following. Patient also found to have sinus bradycardia, cardiology consulted and appreciated. Patient plan for TEE with possible loop recorder on 08/28/2017. Patient noted to have absent pulses in the lower ext B/L, doppler showed total occlusions of arteries. Placed on IV heparin, vascular surgery consulted- s/p fasciotomy.Patient also developed A fib with RVR.   Subjective   Aphasic but able to respond appropriately to questions by head nods. Denies pain or dyspnea. As per RN at bedside,  no acute issues reported.   Assessment & Plan    Acute left MCA CVA  - Presented with right-sided Hemiparesis as well as expressive aphasia - imaging showed large acute left MCA territory infarc with cytotoxic edema causing a 5mm of midlinie shift. Carotid Doppler: Bilateral ICA 1-39% stenosis, vertebral artery flow antegrade, Echocardiogram shows an EF of 55%, no source of emboli. LDL 78, hemoglobin A1c 5.2. Speech therapy working with patient, currently on dysphagia 2 diet and nectar thickened liquids. Seen by stroke team and full stroke protocol was followed, currently on aspirin, statin and now Eliquis after discussion with cardiology on 08/31/2017, pharmacy to dose. Source could have been atrial fibrillation. Awaiting snf placement.   Peripheral vascular disease/ Bilateral lower extremity Ischemia with compartment syndrome - was found to have age intermediate obstructive thrombosis involving the common femoral, superficial femoral, popliteal, posterior tibial, peroneal, anterior tibial arteries bilaterally, vascular surgery was consulted patient was placed on heparin drip and now on Eliquis.   Being followed by vascular surgery and status post  Bilateral embolectomy iliac femoral popliteal, 4 compartment fasciotomy bilaterally, s/p closure of B/L leg fasciotomies on 08/28/2017. Underwent another surgery on 08/30/2017 for Right popliteal and tibial endarterectomy with patch angioplasty of right popliteal artery and tibial peroneal trunk, leg drain removed on 9/15. Continue anticoagulation + ASA. Vascular surgery has seen on 9/17 and cleared for discharge and recommend outpatient follow-up in 2 weeks for staple removal.  Anemia. Anemia panel suggests chronic disease. Hemoglobin has been stable in the mid 8 range for the last couple of days.  New Atrial fibrillation with RVR with likely underlying sick sinus syndrome [place] vasc 2 score of at least 3 -  He received amiodarone and converted to sinus  rhythm. Now in sinus bradycardia. 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 Cardiology input appreciated. Patient has declined pacemaker placement and as per cardiology, not indicated at this point. Remains on Eliquis area and cardiology signed off 9/17 and recommend outpatient follow-up with them.    Hypotension - IV fluids and midodrine, stable random TSH and cortisol. Stable.   Dysuria -UA unremarkable for infection x 2, UA did show large hemoglobin, likely foley related to trauma. This problem has resolved. Remove foley, changed to condom cathter    Dehydration/hypernatremia, with prerenal Azotemia, mild rhabdomyolysis.  All resolved with hydration.  Hyperlipidemia -started on statin       DVT Prophylaxis   Eliquis Code Status: DNR Family Communication: None at bedside today. Disposition Plan: DC to SNF when bed available.  Consultants Neurology Cardiology Vascular surgery Inpatient rehab  Procedures  Echocardiogram Lower extremity doppler Lower ext 4 compartment fasciotomy bilaterally Closure of LE fasciotomies  Right popliteal and tibial endarterectomy with patch angioplasty of right popliteal artery and tibial peroneal trunk  Antibiotics   None at this time.   Objective:   Vitals:   09/04/17 0700 09/04/17 1200 09/04/17 1229 09/04/17 1600  BP: 95/69 105/61  103/61  Pulse: (!) 43 (!) 39  (!) 43  Resp: (!) Temp: 97.9 F (36.6 C)  (!) 97.2 F (36.2 C)   TempSrc: Axillary  Axillary   SpO2: 100% 100%  100%  Weight:      Height:        Intake/Output Summary (Last 24 hours) at 09/04/17 1838 Last data filed at 09/04/17 6962  Gross per 24 hour  Intake              240 ml  Output              500 ml  Net             -260 ml   Filed Weights   08/23/17 0500 08/23/17 2200 08/30/17 1419  Weight: 78.5 kg (173 lb 1 oz) 80.9 kg (178 lb 5.6 oz) 80.7 kg (178 lb)    Exam  Gen. exam: Pleasant middle-aged male, moderately built and nourished lying comfortably  propped up in bed. Does not appear in any distress.  Respiratory system: Clear to auscultation. No increased work of breathing. Cardiovascular system: S1 and S2 heard, regular bradycardia. No JVD, murmurs or pedal edema. Telemetry: Sinus bradycardia in the 40s. Abdominal exam: Nondistended, soft and nontender. Normal bowel sounds heard. CNS: Aphasic. Facial droop to the left, diminished right nasolabial fold. Extremities: Grade 2 x 5 power in right upper extremity and 3 x 5 power in right lower extremity. Left extremities 5 x 5 power. Surgical sites bilateral legs, staples intact without acute findings. Dopplerable dorsalis pedis.   Data Reviewed: I have personally reviewed following labs and imaging studies  CBC:  Recent Labs Lab 08/31/17 0714 09/01/17 0307 09/02/17 0352 09/03/17 0457 09/04/17 0355  WBC 12.1* 10.7* 10.6* 11.7* 11.5*  HGB 9.4* 7.8* 8.4* 8.9* 8.9*  HCT 28.6* 23.6* 25.9* 26.7* 26.6*  MCV 90.5 91.5 91.8 91.1 92.4  PLT 184 180 204 230 279   Basic Metabolic Panel:  Recent Labs Lab 08/29/17  0300 08/30/17 0321 09/02/17 0352 09/03/17 0457 09/04/17 0355  NA 134* 135 136 134* 136  K 4.1 4.0 3.8 3.9 4.1  CL 104 106 104 102 101  CO2 GLUCOSE 102* 99 97 91 91  BUN CREATININE 1.12 1.06 1.12 1.16 1.23  CALCIUM 8.1* 8.0* 8.2* 8.5* 8.4*  MG  --   --  1.8 1.9  --      Radiology Studies: No results found.   Scheduled Meds: . apixaban  5 mg Oral BID  . aspirin  325 mg Oral Daily  . atorvastatin  20 mg Oral q1800  . chlorhexidine  15 mL Mouth Rinse BID  . docusate sodium  100 mg Oral Daily  . mouth rinse  15 mL Mouth Rinse q12n4p  . midodrine  10 mg Oral TID WC  . pantoprazole  40 mg Oral BID   Continuous Infusions: . amiodarone Stopped (08/30/17 0730)  . magnesium sulfate 1 - 4 g bolus IVPB       LOS: 13 days   Time Spent in minutes   15 minutes   Jostin Rue, MD, FACP, FHM. [facility] Pager [phone]  If  7PM-7AM, please contact night-coverage [email or web address] Password [email or web address] 09/04/2017, 6:49 PM

## 2017-09-04 NOTE — Progress Notes (Signed)
Physical Therapy Treatment Patient Details Name: [name] [name] MRN: [id number] DOB: [birth year] Today's Date: 09/04/2017    History of Present Illness Patient is a 66 y/o male who was found down at home covered in urine. 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 No PMH as pt has not been to the doctor. Noted bilateral leg ischemia on 9/8; to OR for Bilateral embolectomy iliac femoral popliteal, 4 compartment fasciotomy bilaterally that evening, with closure of fasciotomies 08/28/17.    PT Comments    Pt very cooperative and willing to participate in therapy. Pt continues to demonstrate right inattention. Cueing required for awareness of RUE positioning during supine to sit. Pt in recliner at end of session. PT to continue per POC.     Follow Up Recommendations  SNF     Equipment Recommendations  Other (comment) (TBA)    Recommendations for Other Services       Precautions / Restrictions Precautions Precautions: Fall;Other (comment) Precaution Comments: RUE hemiparesis, right inattention, aphasia    Mobility  Bed Mobility         Supine to sit: Mod assist;HOB elevated     General bed mobility comments: assist with RLE off bed, elevating trunk and scooting hip to EOB  Transfers       Sit to Stand: Mod assist;+2 physical assistance Stand pivot transfers: +2 physical assistance;Mod assist       General transfer comment: Sit to stand with RW bedside x 2 trials. Assist needed for RUE grip on RW. R knee blocked to prevent buckling. Pt able to perform lateral weight shifts as well as mini squats x 3. RW removed and stand-pivot transfer performed bed to recliner.  Ambulation/Gait             General Gait Details: unable   Stairs            Wheelchair Mobility    Modified Rankin (Stroke Patients Only) Modified Rankin (Stroke Patients Only) Pre-Morbid Rankin Score: No symptoms Modified Rankin: Severe disability     Balance    Sitting-balance support: Feet supported;No upper extremity supported Sitting balance-Leahy Scale: Good     Standing balance support: During functional activity;Single extremity supported Standing balance-Leahy Scale: Poor                              Cognition Arousal/Alertness: Awake/alert Behavior During Therapy: Flat affect Overall Cognitive Status: Difficult to assess                     Current Attention Level: Selective   Following Commands: Follows multi-step commands consistently     Problem Solving: Slow processing;Requires verbal cues;Difficulty sequencing General Comments: pt continues to have unreliable yes/no response, pt remains non verbal      Exercises      General Comments        Pertinent Vitals/Pain Pain Assessment: Faces Faces Pain Scale: No hurt    Home Living                      Prior Function            PT Goals (current goals can now be found in the care plan section) Acute Rehab PT Goals Patient Stated Goal: aphasic PT Goal Formulation: Patient unable to participate in goal setting Time For Goal Achievement: 09/08/17 Potential to Achieve Goals: Fair Progress towards PT goals: Progressing toward goals  Frequency    Min 4X/week      PT Plan Current plan remains appropriate    Co-evaluation              AM-PAC PT "6 Clicks" Daily Activity  Outcome Measure  Difficulty turning over in bed (including adjusting bedclothes, sheets and blankets)?: A Lot Difficulty moving from lying on back to sitting on the side of the bed? : Unable Difficulty sitting down on and standing up from a chair with arms (e.g., wheelchair, bedside commode, etc,.)?: Unable Help needed moving to and from a bed to chair (including a wheelchair)?: A Lot Help needed walking in hospital room?: Total Help needed climbing 3-5 steps with a railing? : Total 6 Click Score: 8    End of Session Equipment Utilized During  Treatment: Gait belt Activity Tolerance: Patient tolerated treatment well Patient left: in chair;with chair alarm set;with call bell/phone within reach Nurse Communication: Mobility status PT Visit Diagnosis: Hemiplegia and hemiparesis;Unsteadiness on feet (R26.81);Difficulty in walking, not elsewhere classified (R26.2) Hemiplegia - Right/Left: Right Hemiplegia - dominant/non-dominant: Dominant Hemiplegia - caused by: Cerebral infarction     Time: 1610-9604 PT Time Calculation (min) (ACUTE ONLY): 14 min  Charges:  $Therapeutic Activity: 8-22 mins                    G Codes:       Aida Raider, PT  Office # [phone] Pager [phone]    Ilda Foil 09/04/2017, 12:29 PM

## 2017-09-04 NOTE — Progress Notes (Signed)
Incisions healing No complaints PT DP doppler right  PT doppler left Ok for d/c from my standpoint He will need follow up in 2 weeks for staple removal  [name], MD [facility] Office: [phone] Pager: [phone]

## 2017-09-04 NOTE — Care Management Important Message (Signed)
Important Message  Patient Details  Name: [name] [name] MRN: [id number] Date of Birth: 06-19-1951   Medicare Important Message Given:  Yes    Nichole Neyer Abena 09/04/2017, 10:36 AM

## 2017-09-04 NOTE — Progress Notes (Signed)
Patient tolerated sitting up in chair approximately 2 hours without difficulty.  Patient able to feed self with minimal cueing after setting food up before him. Patient had bowel movement this am.

## 2017-09-04 NOTE — Progress Notes (Signed)
Recent a-fib, now sinus brady - tachybrady. Not interested in pacer and not indicated at this point. Recommend cardiology follow-up with Dr. [name] or Dr. Graciela Husbands after discharge. We will arrange. If a-fib returns, don't hesitate to call for assistance.  Will sign-off for now.   Chrystie Nose, MD, [facility]  [place]  [facility]  Attending Cardiologist  Direct Dial: [phone]  Fax: [phone]  Website:  [email or web address]

## 2017-09-04 NOTE — Plan of Care (Signed)
Problem: Skin Integrity: Goal: Risk for impaired skin integrity will decrease Outcome: Not Progressing DTI found on patient's right heel. Patient frequently refuses turns at night and favors laying on that side. Foam placed to heel and attempted to encourage turns.   Problem: Nutrition: Goal: Risk of aspiration will decrease Outcome: Progressing Patient being followed closely by SLP and on prescribed diet for dysphagia 2 with nectar thick liquids. Patient's meds given crushed in applesauce and he tolerates well. Able to feed self during mealtimes with some assist and setup.

## 2017-09-05 ENCOUNTER — Telehealth: Payer: Self-pay | Admitting: Vascular Surgery

## 2017-09-05 DIAGNOSIS — D649 Anemia, unspecified: Secondary | ICD-10-CM | POA: Diagnosis not present

## 2017-09-05 DIAGNOSIS — I679 Cerebrovascular disease, unspecified: Secondary | ICD-10-CM | POA: Diagnosis not present

## 2017-09-05 DIAGNOSIS — I480 Paroxysmal atrial fibrillation: Secondary | ICD-10-CM | POA: Diagnosis not present

## 2017-09-05 DIAGNOSIS — I69920 Aphasia following unspecified cerebrovascular disease: Secondary | ICD-10-CM | POA: Diagnosis not present

## 2017-09-05 DIAGNOSIS — Z8673 Personal history of transient ischemic attack (TIA), and cerebral infarction without residual deficits: Secondary | ICD-10-CM | POA: Diagnosis not present

## 2017-09-05 DIAGNOSIS — R1312 Dysphagia, oropharyngeal phase: Secondary | ICD-10-CM | POA: Diagnosis not present

## 2017-09-05 DIAGNOSIS — M6281 Muscle weakness (generalized): Secondary | ICD-10-CM | POA: Diagnosis not present

## 2017-09-05 DIAGNOSIS — I4891 Unspecified atrial fibrillation: Secondary | ICD-10-CM | POA: Diagnosis not present

## 2017-09-05 DIAGNOSIS — I739 Peripheral vascular disease, unspecified: Secondary | ICD-10-CM | POA: Diagnosis not present

## 2017-09-05 DIAGNOSIS — I5043 Acute on chronic combined systolic (congestive) and diastolic (congestive) heart failure: Secondary | ICD-10-CM | POA: Diagnosis not present

## 2017-09-05 DIAGNOSIS — Z9181 History of falling: Secondary | ICD-10-CM | POA: Diagnosis not present

## 2017-09-05 DIAGNOSIS — R471 Dysarthria and anarthria: Secondary | ICD-10-CM | POA: Diagnosis not present

## 2017-09-05 DIAGNOSIS — Z48812 Encounter for surgical aftercare following surgery on the circulatory system: Secondary | ICD-10-CM | POA: Diagnosis not present

## 2017-09-05 DIAGNOSIS — Z9889 Other specified postprocedural states: Secondary | ICD-10-CM | POA: Diagnosis not present

## 2017-09-05 DIAGNOSIS — R41841 Cognitive communication deficit: Secondary | ICD-10-CM | POA: Diagnosis not present

## 2017-09-05 DIAGNOSIS — G8190 Hemiplegia, unspecified affecting unspecified side: Secondary | ICD-10-CM | POA: Diagnosis not present

## 2017-09-05 DIAGNOSIS — R491 Aphonia: Secondary | ICD-10-CM | POA: Diagnosis not present

## 2017-09-05 DIAGNOSIS — I69320 Aphasia following cerebral infarction: Secondary | ICD-10-CM | POA: Diagnosis not present

## 2017-09-05 DIAGNOSIS — R2681 Unsteadiness on feet: Secondary | ICD-10-CM | POA: Diagnosis not present

## 2017-09-05 DIAGNOSIS — I69351 Hemiplegia and hemiparesis following cerebral infarction affecting right dominant side: Secondary | ICD-10-CM | POA: Diagnosis not present

## 2017-09-05 DIAGNOSIS — E785 Hyperlipidemia, unspecified: Secondary | ICD-10-CM | POA: Diagnosis not present

## 2017-09-05 DIAGNOSIS — G464 Cerebellar stroke syndrome: Secondary | ICD-10-CM | POA: Diagnosis not present

## 2017-09-05 MED ORDER — ACETAMINOPHEN 325 MG PO TABS: 650.0000 mg | ORAL_TABLET | Freq: Four times a day (QID) | ORAL | Status: DC | PRN

## 2017-09-05 MED ORDER — ATORVASTATIN CALCIUM 20 MG PO TABS: 20.0000 mg | ORAL_TABLET | Freq: Every day | ORAL | Status: DC

## 2017-09-05 MED ORDER — MIDODRINE HCL 10 MG PO TABS: 10.0000 mg | ORAL_TABLET | Freq: Three times a day (TID) | ORAL | Status: DC

## 2017-09-05 MED ORDER — APIXABAN 5 MG PO TABS: 5.0000 mg | ORAL_TABLET | Freq: Two times a day (BID) | ORAL | Status: DC

## 2017-09-05 MED ORDER — DOCUSATE SODIUM 100 MG PO CAPS: 100.0000 mg | ORAL_CAPSULE | Freq: Every day | ORAL | Status: DC

## 2017-09-05 MED ORDER — RESOURCE THICKENUP CLEAR PO POWD: ORAL | Status: DC

## 2017-09-05 NOTE — Telephone Encounter (Signed)
Sched appt 09/21/17 at 9:15. Lm on hm#.

## 2017-09-05 NOTE — Progress Notes (Signed)
Report called to [place] [facility] in [place].  Patient seems excited about being discharged to SNF for rehab.

## 2017-09-05 NOTE — Discharge Summary (Addendum)
Physician Discharge Summary  [name] [name] ZOX:[id number] DOB: 08/28/1951  PCP: Patient, No Pcp Per  Admit date: 08/22/2017 Discharge date: 09/05/2017  Recommendations for Outpatient Follow-up:  1. M.D. at SNF in 3 days with repeat labs (CBC & BMP). 2. Joaquin Courts, FNP/new PCP on 09/11/17 at 3 PM. This is at the [facility] 3. Dr. Rollene Rotunda, Cardiology on 09/27/17 at 8:40 AM. 4. Charisse March, NP/vascular surgery on 09/21/17 at 9:15 AM for staple removal. 5. Dr. Delia Heady, Neuology in 4 weeks. Ambulatory referral was sent today.  Home Health: None Equipment/Devices: None    Discharge Condition: Improved and stable  CODE STATUS: DO NOT RESUSCITATE  Diet recommendation: As per speech therapy recommendations as follows:  Diet recommendations: Dysphagia 2 (fine chop);Nectar-thick liquid Liquids provided via: Straw Medication Administration: Crushed with puree Supervision: Patient able to self feed;Full supervision/cueing for compensatory strategies Compensations: Slow rate;Small sips/bites;Lingual sweep for clearance of pocketing;Monitor for anterior loss Postural Changes and/or Swallow Maneuvers: Seated upright 90 degrees;Upright 30-60 min after meal  Discharge Diagnoses:  Principal Problem:   Acute cerebrovascular accident (CVA) (HCC) Active Problems:   Right hemiparesis (HCC)   Volume depletion   Altered mental status   Aphasia   AKI (acute kidney injury) (HCC)   Hypernatremia   Bradycardia   Pressure injury of skin   Cerebrovascular accident (CVA) (HCC)   Ischemic leg   Dysphagia, post-stroke   Acute on chronic combined systolic and diastolic CHF (congestive heart failure) (HCC)   Tachypnea   PAF (paroxysmal atrial fibrillation) (HCC)   Leukocytosis   Acute blood loss anemia   Hyponatremia   Brief Summary: 66 year old male with unknown prior medical history, presented to ED on 08/22/17 and as per ED, a family member found him on the  floor in urine, they went by his home because he they had not heard from him in 2 days. They asked the mother who was at home as to how long this had been going and she also stated 2 days. Upon presentation, the patient was completely flaccid on the right side, oriented to name only, unable to speak and would not follow commands. He did weakly respond to simple questions. As per patient's stepbrother, patient had never been sick prior to this admission. He walked every day, didn't smoke or drink. He grew up in [place], [place]. 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 He never went to the doctor, never complained about anything. He retired 5-10 years ago. He didn't take any medications that the stepbrother was aware of. Patient's mother had a stroke last year and the patient apparently had been looking after her. CT head showed a large left frontoparietal CVA, MCA distribution. He initially presented and was admitted to [facility] on 08/22/17. Subsequently when MRI brain revealed fairly large MCA infarct and there was concern for edema, he was transferred to [facility] on 08/24/17 for further evaluation and management. He completed stroke evaluation. Hospital course was complicated by bilateral lower extremity ischemia for which she underwent multiple vascular procedures by vascular surgery. He also had paroxysmal A. fib with RVR and sinus bradycardia, tachybradycardia syndrome for which cardiology consulted.  Assessment and plan:  Acute stroke: Large acute left MCA territory infarct most confluent along the insula, opercula and basal ganglia with petechial hemorrhage and cytotoxic edema with 5 mL of midline shift, embolic, likely due to A. fib. Neurology was consulted and patient completed stroke workup as  follows. Resultant expressive aphasia and right dense hemiplegia. CT head: No acute large left MCA territory infarct with left basal ganglia suspected petechial  hemorrhage and 3 MAM rightward midline shift. MRI head: Large acute left MCA territory infarct most confluent along the insula, opercula and basal ganglia with petechial hemorrhage and cytotoxic edema with 5 mL of midline shift. MRA head: L M1 occlusion, advanced R P2 stenosis. Carotid Doppler: Bilateral ICA 1-39 percent stenosis, vertebral arteries antegrade. 2-D echo: LVEF 55%. No source of embolus. LDL: 78. Hemoglobin A1c: 5.2. Diet: Modified as per speech therapy recommendations as above. Therapy recommendations: SNF. Not on antithrombotic prior to admission. Initially placed on full dose aspirin. Patient started on Eliquis for A. fib. I discussed with Dr. Pearlean Brownie on day of discharge who recommended that patient does not need aspirin and can continue Eliquis alone. Outpatient follow-up with neurology in 4 weeks.  Peripheral artery disease/bilateral lower extremity ischemia On 08/26/17, lower extremities were cool to touch and distal pulses were not palpable. Due to concern for arterial insufficiency, patient was started on IV heparin drip and vascular surgery was consulted. Vascular surgery requested CTA with runoff to further define anatomy and look for source. He was emergently taken to surgery on 08/26/17 and underwent bilateral embolectomy iliac femoral popliteal, 4 compartment fasciotomy bilaterally. It was felt that the etiology of his lower extremity ischemia was cardioembolic event. He was taken back to the OR on 08/28/17 for closure of bilateral leg fasciotomies. 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 As per vascular surgery, he has chronic occlusive disease below the knee bilaterally but adequate flow for now. Vascular surgery has evaluated and cleared for discharge from their standpoint. I discussed with  Dr. Darrick Penna who recommended continued anticoagulation with Eliquis and no indication for aspirin which was discontinued at discharge.  Hyperlipidemia LDL 78, goal <70. Started statins, continue.  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 Patient periodically had heart rates as low as 30 but asymptomatic. As per cardiology follow-up on 9/12, patient with profound bradycardia and episodes of A. fib with RVR and hypotension. He cardioverted to sinus rhythm on night of 9/11. Amiodarone was discontinued secondary to bradycardia. Pacemaker was considered and discussed with patient but he consistently refused to have a pacemaker. He was deemed competent to make his own medical decisions and these issues were also discussed with his family. Cardiology expects recurrent A. fib and that it will likely be fast. As per final cardiology input on 9/17, recent A. fib, now sinus bradycardia-tachybradycardia. Patient not interested in pacemaker and not indicated at this time. Outpatient cardiology follow-up was arranged.  Atrial fibrillation with RVR, paroxysmal On 9/10, cardiology noted that patient was in A. fib with RVR. He was started on IV amiodarone. IV heparin was continued. He then cardioverted to sinus bradycardia, please see discussion above. Currently on no rate control medications and avoid same due to issues with bradycardia. Continue anticoagulation.  Dehydration with hypernatremia Resolved after IV fluids.  Acute kidney injury Likely related to dehydration and mild rhabdomyolysis (CK 780). Resolved after IV fluids.  Mild rhabdomyolysis  Leukocytosis Likely stress response. No clinical suspicion for infection. Improved.  Hypotension Treated initially with IV fluid boluses and then started on midodrine. Blood pressures fluctuate at times but stable. TSH (1.627) and cortisol (10.1) unremarkable.  Dysuria Urine microscopy  unremarkable for infection 2. Urine microscopy did show large hemoglobin, likely related to trauma. Foley catheter was discontinued. Recommend repeating urine microscopy in a week or 2 to reassess the microscopic hematuria. If persists, needs further evaluation. Denies dysuria.  Anemia No report of bleeding. Empirically started on PPI but discontinued on discharge. Anemia panel suggests chronic disease and hemoglobin has been stable in the mid 8 range for the last couple of days. Outpatient follow-up with periodic CBCs and further evaluation as deemed necessary.  Dysphagia Secondary to acute stroke. Modified diet as per speech therapy recommendations as above.  Consultations:  Neurology/stroke service  Cardiology  Vascular surgery  Inpatient rehabilitation  Procedures:  As dictated above.   Discharge Instructions  Discharge Instructions    Ambulatory referral to Neurology    Complete by:  As directed    An appointment is requested in approximately: 4 weeks   Call MD for:    Complete by:  As directed    Strokelike symptoms.   Call MD for:  difficulty breathing, headache or visual disturbances    Complete by:  As directed    Call MD for:  extreme fatigue    Complete by:  As directed    Call MD for:  persistant dizziness or light-headedness    Complete by:  As directed    Call MD for:  persistant nausea and vomiting    Complete by:  As directed    Call MD for:  redness, tenderness, or signs of infection (pain, swelling, redness, odor or green/yellow discharge around incision site)    Complete by:  As directed    Call MD for:  severe uncontrolled pain    Complete by:  As directed    Call MD for:  temperature >100.4    Complete by:  As directed    Diet - low sodium heart healthy    Complete by:  As directed    Discharge instructions    Complete by:  As directed    Diet: As per speech therapy recommendations as below,  Diet recommendations: Dysphagia 2 (fine  chop);Nectar-thick liquid Liquids provided via: Straw Medication Administration: Crushed with puree Supervision: Patient able to self feed;Full supervision/cueing for compensatory strategies Compensations: Slow rate;Small sips/bites;Lingual sweep for clearance of pocketing;Monitor for anterior loss Postural Changes and/or Swallow Maneuvers: Seated upright 90 degrees;Upright 30-60 min after meal   Increase activity slowly    Complete by:  As directed        Medication List    TAKE these medications   acetaminophen 325 MG tablet Commonly known as:  TYLENOL Take 2 tablets (650 mg total) by mouth every 6 (six) hours as needed for mild pain, moderate pain, fever or headache.   apixaban 5 MG Tabs tablet Commonly known as:  ELIQUIS Take 1 tablet (5 mg total) by mouth 2 (two) times daily.   atorvastatin 20 MG tablet Commonly known as:  LIPITOR Take 1 tablet (20 mg total) by mouth daily at 6 PM.   docusate sodium 100 MG capsule Commonly known as:  COLACE Take 1 capsule (100 mg total) by mouth daily.   midodrine 10 MG tablet Commonly known as:  PROAMATINE Take 1 tablet (10 mg total) by mouth 3 (three) times daily with meals.   RESOURCE THICKENUP CLEAR Powd Oral, as needed.       Contact information for follow-up providers    [facility] Follow up on 09/11/2017.   Why:  3  pm for hospital folow up Contact information: [address] [id number] mc [place] [address] [phone]       Rollene Rotunda, MD Follow up.   Specialty:  Cardiology Why:  Cardiology Hospital Follow-Up on 09/27/2017 at 8:40AM.  Contact information: [address] [place] [address] [phone]        Nickel, Carma Lair, NP Follow up on 09/21/2017.   Specialty:  Vascular Surgery Why:  9:15 am for staple removal. Contact information: [address] [place] [address] [phone]        MD at SNF. Schedule an appointment as soon as  possible for a visit in 3 day(s).   Why:  to be seen with repeat labs (CBC & BMP).           Contact information for after-discharge care    Destination    [facility] Follow up.   Specialty:  Skilled Nursing Facility Contact information: [address] [place] [address] [phone]                 No Known Allergies    Procedures/Studies: Dg Chest 2 View  Result Date: 08/22/2017 CLINICAL DATA:  Initial evaluation for acute altered mental status. EXAM: CHEST  2 VIEW COMPARISON:  None. FINDINGS: Cardiac and mediastinal silhouettes are within normal limits. Lungs hypoinflated. No focal infiltrates. No pulmonary edema or pleural effusion. No pneumothorax. No acute osseus abnormality. IMPRESSION: No active cardiopulmonary disease. Electronically Signed   By: Rise Mu M.D.   On: 08/22/2017 18:19   Ct Head Wo Contrast  Result Date: 08/22/2017 CLINICAL DATA:  RIGHT-sided weakness. Altered level of consciousness. Found fall on floor in urine. EXAM: CT HEAD WITHOUT CONTRAST TECHNIQUE: Contiguous axial images were obtained from the base of the skull through the vertex without intravenous contrast. COMPARISON:  None. FINDINGS: BRAIN: Blurring of the LEFT frontotemporal parietal gray-white matter junction with wedge-like hypodensity extending into the LEFT basal ganglia, involving the LEFT insula. Punctate densities LEFT basal ganglia. Regional mass effect with partially effaced LEFT lateral ventricle, no hydrocephalus. 3 mm LEFT-to-RIGHT midline shift. No intraparenchymal hemorrhage. No abnormal extra-axial fluid collections. Basal cisterns are patent. VASCULAR: Dense LEFT MCA, potentially overestimated by surrounding parenchymal hypodensity. Mild calcific atherosclerosis carotid siphons. SKULL: No skull fracture. No significant scalp soft tissue swelling. 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 SINUSES/ORBITS: The mastoid air-cells and  included paranasal sinuses are well-aerated. Soft tissue within the external auditory canals most compatible with cerumen. The included ocular globes and orbital contents are non-suspicious. OTHER: None. IMPRESSION: 1. Acute large LEFT MCA territory infarct with LEFT basal ganglia suspected petechial hemorrhage. 3 mm LEFT-to-RIGHT midline shift without ventricular entrapment or hydrocephalus. 2. Critical Value/emergent results were called by telephone at the time of interpretation on 08/22/2017 at 5:57 pm to Dr. Marily Memos , who verbally acknowledged these results. Electronically Signed   By: Awilda Metro M.D.   On: 08/22/2017 17:59   Mr Brain Wo Contrast  Result Date: 08/23/2017 CLINICAL DATA:  Unresponsive.  Abnormal head CT. EXAM: MRI HEAD WITHOUT CONTRAST MRA HEAD WITHOUT CONTRAST TECHNIQUE: Multiplanar, multiecho pulse sequences of the brain and surrounding structures were obtained without intravenous contrast. Angiographic images of the head were obtained using MRA technique without contrast. COMPARISON:  Head CT from yesterday FINDINGS: MRI HEAD FINDINGS Brain: Large area of acute infarct in the left MCA territory affecting the insula, frontotemporal operculum, inferolateral frontal lobe, and posterior temporal/parietal cortex. Infarcted striatum and anterior limb internal  capsule with basal ganglia petechial hemorrhage. Cytotoxic edema causes up to 5 mm of midline shift. Probable small remote left inferior cerebellar infarct. No hydrocephalus or masslike findings. Vascular: Arterial findings below. Normal dural venous sinus flow voids. Skull and upper cervical spine: Negative for marrow lesion Sinuses/Orbits: No acute finding. MRA HEAD FINDINGS Symmetric carotid arteries that are patent. Bilateral P1 hypoplasia. Flow gap at the distal left M1 segment with under opacification of the less numerous left MCA branches. Accounting for artifact, no stenosis or branch occlusion seen within bilateral ACA and  right MCA vessels. Small vertebrobasilar vessels in the setting of fetal type PCAs. There is asymmetric poor signal within the right PCA beginning at the P2 segment, where there may be an underlying severe stenosis. No aneurysm findings. IMPRESSION: Brain MRI: 1. Large acute left MCA territory infarct most confluent along the insula, opercula, and basal ganglia. No evidence of infarct progression compared to head CT yesterday. 2. Petechial hemorrhage is seen within the left striatum. 3. Cytotoxic edema causes 5 mm of midline shift. Intracranial MRA: 1. Distal left M1 apparent occlusion with flow gap. 2. Poor flow in the right PCA beginning at the P2 segment, presumably from underlying advanced stenosis. Electronically Signed   By: Marnee Spring M.D.   On: 08/23/2017 10:22   Ct Angio Ao+bifem W & Or Wo Contrast  Result Date: 08/26/2017 CLINICAL DATA:  Assess for obstructing thrombus involving the bilateral lateral extremity vessels EXAM: CT ANGIOGRAPHY OF ABDOMINAL AORTA WITH ILIOFEMORAL RUNOFF TECHNIQUE: Multidetector CT imaging of the abdomen, pelvis and lower extremities was performed using the standard protocol during bolus administration of intravenous contrast. Multiplanar CT image reconstructions and MIPs were obtained to evaluate the vascular anatomy. CONTRAST:  100 mL Isovue 370  certainty intravenous COMPARISON:  None. FINDINGS: VASCULAR Aorta: No aneurysmal dilatation. Mild eccentric thrombus in the distal aorta above the bifurcation. Scattered atherosclerotic calcification. Celiac: Linear thrombus in the celiac trunk. This extends into the proximal splenic artery. Minimal thrombus at the origin of the common hepatic artery. There is distal vessel patency. SMA: Patent without evidence of aneurysm, dissection, vasculitis or significant stenosis. Renals: Both renal arteries are patent without evidence of aneurysm, dissection, vasculitis, fibromuscular dysplasia or significant stenosis. IMA: Patent  without evidence of aneurysm, dissection, vasculitis or significant stenosis. RIGHT Lower Extremity Inflow: Near occlusive thrombus in the right common iliac. Occlusive thrombus in the internal iliac division with enhancement of distal right pelvic vessels. Occlusive thrombus within the external iliac vessel. Outflow: Occlusive thrombus within the right common femoral, superficial femoral, and popliteal vessels. Occlusive thrombus in the proximal profunda artery with enhancement of vessels in the thigh. Runoff: Near occlusive thrombus in the tibial peroneal trunk. Single vessel runoff via the posterior tibial artery. Severe disease and distal occlusion of the anterior tibial artery. Diminutive peroneal artery which is visualized to the mid lower leg. LEFT Lower Extremity Inflow: Left common femoral artery is patent. Near occlusive thrombus within the proximal external iliac vessel with occlusive thrombus in the distal external iliac vessel. Internal iliac artery is patent. Outflow: Occlusive thrombus in the common femoral and proximal to mid superficial femoral artery with some reconstitution of flow in the mid to distal superficial femoral artery which then becomes occluded distally. 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 Profunda artery is patent. Runoff: Occlusive thrombus in the tibial peroneal trunk. Single vessel runoff via the posterior tibial artery. Diffusely disease anterior tibial artery and peroneal artery. Peroneal and anterior  tibial artery is visualized to the level of the mid lower leg Veins: No obvious venous abnormality within the limitations of this arterial phase study. Review of the MIP images confirms the above findings. NON-VASCULAR Lower chest: Lung bases demonstrate no acute consolidation or pleural effusion. Patchy dependent atelectasis. Cardiomegaly. Hepatobiliary: Calcified gallstone.  No focal hepatic abnormality Pancreas: Unremarkable. No  pancreatic ductal dilatation or surrounding inflammatory changes. Spleen: Wedge-shaped hypodensity within the posterior spleen suspect for splenic infarct. Adrenals/Urinary Tract: Adrenal glands are within normal limits. Bilateral renal cortical scarring. No hydronephrosis. The bladder demonstrates mild irregular wall thickening Stomach/Bowel: Stomach is within normal limits. No evidence of bowel wall thickening, distention, or inflammatory changes. Lymphatic: No significantly enlarged abdominal or pelvic lymph nodes. Reproductive: Enlarged prostate with mass effect on the bladder posteriorly Other: Negative for free air or free fluid Musculoskeletal: No acute or suspicious bone lesions. Degenerative changes of the spine. IMPRESSION: VASCULAR 1. Near occlusive thrombus within the right common iliac vessel with occlusive thrombus involving the right external iliac, common femoral, superficial femoral, and right popliteal vessels. Occlusive thrombus within the proximal profunda artery with distal flow present. Reconstituted flow at the trifurcation with single vessel runoff via the posterior tibial artery on the right. 2. Near occlusive to occlusive thrombus within the left external iliac artery with occlusive thrombus visualized in the left common femoral and superficial femoral artery. Reconstitution of some flow in the mid to distal SFA with thrombosis of the popliteal artery. 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 Single vessel runoff via the left posterior tibial artery. 3. Nonocclusive thrombus in the celiac trunk which extends to the left splenic artery. Wedge-shaped hypodensity in the spleen is suspicious for an infarct. Small amount of thrombus at the origin of the common hepatic artery. NON-VASCULAR 1. Wedge-shaped hypodensity in the spleen suspicious for infarct 2. Gallstone 3. Enlarged prostate with mass effect on the bladder Review of patient's epic chart at the time of dictation indicates that the images were  reviewed by Dr. Darrick Penna with pertinent findings made and the patient was in the OR at the time of dictation. Electronically Signed   By: Jasmine Pang M.D.   On: 08/26/2017 17:17   US Carotid Bilateral (at [facility] And [facility] Only)  Result Date: 08/23/2017 CLINICAL DATA:  66 year old male with a history of altered mental status. Cardiovascular risk factors include stroke/TIA EXAM: BILATERAL CAROTID DUPLEX ULTRASOUND TECHNIQUE: Wallace Cullens scale imaging, color Doppler and duplex ultrasound were performed of bilateral carotid and vertebral arteries in the neck. COMPARISON:  No prior duplex FINDINGS: Criteria: Quantification of carotid stenosis is based on velocity parameters that correlate the residual internal carotid diameter with NASCET-based stenosis levels, using the diameter of the distal internal carotid lumen as the denominator for stenosis measurement. The following velocity measurements were obtained: RIGHT ICA:  Systolic 72 cm/sec, Diastolic 28 cm/sec CCA:  91 cm/sec SYSTOLIC ICA/CCA RATIO:  0.8 ECA:  80 cm/sec LEFT ICA:  Systolic 62 cm/sec, Diastolic 15 cm/sec CCA:  90 cm/sec SYSTOLIC ICA/CCA RATIO:  0.7 ECA:  77 cm/sec Right Brachial SBP: Not acquired Left Brachial SBP: Not acquired RIGHT CAROTID ARTERY: No significant calcifications of the right common carotid artery. Intermediate waveform maintained. Heterogeneous and partially calcified plaque at the right carotid bifurcation. No significant lumen shadowing. Low resistance waveform of the right ICA. Mild tortuosity RIGHT VERTEBRAL ARTERY: Antegrade flow with low resistance waveform. LEFT CAROTID ARTERY: No significant calcifications of the left common carotid artery. Intermediate waveform maintained. Heterogeneous and partially calcified plaque at  the left carotid bifurcation without significant lumen shadowing. Low resistance waveform of the left ICA. Duplex demonstrates color signal within the plaque of the far field wall at the bifurcation. Mild tortuosity LEFT  VERTEBRAL ARTERY:  Antegrade flow with low resistance waveform. IMPRESSION: Right: Color duplex indicates minimal heterogeneous and calcified plaque, with no hemodynamically significant stenosis by duplex criteria in the extracranial cerebrovascular circulation. Left: Color duplex indicates minimal heterogeneous and calcified plaque, with no hemodynamically significant stenosis by duplex criteria in the extracranial cerebrovascular circulation. Color duplex demonstrates changes suggesting ulcerated plaque at the carotid bifurcation. Signed, Yvone Neu. Loreta Ave, DO [facility] Radiology Electronically Signed   By: Gilmer Mor D.O.   On: 08/23/2017 14:26   Dg Swallowing Func-speech Pathology  Result Date: 08/24/2017 Objective Swallowing Evaluation: Type of Study: MBS-Modified Barium Swallow Study Patient Details Name: WOODRUFF SKIRVIN MRN: [id number] Date of Birth: 06-15-1951 Today's Date: 08/24/2017 Time: SLP Start Time (ACUTE ONLY): 1258-SLP Stop Time (ACUTE ONLY): 1316 SLP Time Calculation (min) (ACUTE ONLY): 18 min Past Medical History: No past medical history on file. Past Surgical History: No past surgical history on file. HPI: Ptis a 66 [facility] large subacute MCA infarct and 5 mm midline shift. He was transferred to [facility] out of concern for swelling. No significant PMH is known. Subjective: pt alert but nonverbal Assessment / Plan / Recommendation CHL IP CLINICAL IMPRESSIONS 08/24/2017 Clinical Impression Pt has a moderate oral and mild pharyngeal dysphagia due to right-sided weakness and sensory deficits. 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 He also has disorganized bolus formation and slow transit, with R sided pocketing that is most prevalent with soft solids as almost the entire bolus remains in his buccal cavity until it is manually  removed by SLP. His pharyngeal phase is grossly functional except for mild base of tongue weakness that allows for some vallecular residue; however, his poor oral control also results in intermittent premature spillage that causes silent penetration to the true vocal folds with thin and nectar thick liquids. When he consumed nectar thick liquids by straw, his oral containment was improved, even with challenging. Recommend initiation of Dys 1 diet and nectar thick liquids by straw.  SLP Visit Diagnosis Dysphagia, oropharyngeal phase (R13.12) Attention and concentration deficit following -- Frontal lobe and executive function deficit following -- Impact on safety and function Mild aspiration risk;Moderate aspiration risk   CHL IP TREATMENT RECOMMENDATION 08/24/2017 Treatment Recommendations Therapy as outlined in treatment plan below   Prognosis 08/24/2017 Prognosis for Safe Diet Advancement Good Barriers to Reach Goals Language deficits Barriers/Prognosis Comment -- CHL IP DIET RECOMMENDATION 08/24/2017 SLP Diet Recommendations Dysphagia 1 (Puree) solids;Nectar thick liquid Liquid Administration via Straw only (NO cup sips) Medication Administration Crushed with puree Compensations Slow rate;Small sips/bites;Lingual sweep for clearance of pocketing;Monitor for anterior loss Postural Changes Seated upright at 90 degrees   CHL IP OTHER RECOMMENDATIONS 08/24/2017 Recommended Consults -- Oral Care Recommendations Oral care BID Other Recommendations Have oral suction available;Order thickener from pharmacy;Prohibited food (jello, ice cream, thin soups);Remove water pitcher   CHL IP FOLLOW UP RECOMMENDATIONS 08/24/2017 Follow up Recommendations Inpatient Rehab   CHL IP FREQUENCY AND DURATION 08/24/2017 Speech Therapy Frequency (ACUTE ONLY) min 2x/week Treatment Duration 2 weeks      CHL IP ORAL PHASE 08/24/2017 Oral Phase Impaired Oral - Pudding Teaspoon -- Oral - Pudding Cup --  Oral - Honey Teaspoon -- Oral - Honey Cup -- Oral - Nectar  Teaspoon -- Oral - Nectar Cup Right anterior bolus loss;Weak lingual manipulation;Reduced posterior propulsion;Delayed oral transit;Decreased bolus cohesion;Premature spillage Oral - Nectar Straw Weak lingual manipulation;Reduced posterior propulsion;Delayed oral transit;Decreased bolus cohesion;Premature spillage Oral - Thin Teaspoon -- Oral - Thin Cup Right anterior bolus loss;Weak lingual manipulation;Reduced posterior propulsion;Delayed oral transit;Decreased bolus cohesion;Premature spillage Oral - Thin Straw -- Oral - Puree Right anterior bolus loss;Weak lingual manipulation;Reduced posterior propulsion;Delayed oral transit;Decreased bolus cohesion;Right pocketing in lateral sulci Oral - Mech Soft Right anterior bolus loss;Weak lingual manipulation;Reduced posterior propulsion;Delayed oral transit;Decreased bolus cohesion;Premature spillage;Right pocketing in lateral sulci Oral - Regular -- Oral - Multi-Consistency -- Oral - Pill -- Oral Phase - Comment --  CHL IP PHARYNGEAL PHASE 08/24/2017 Pharyngeal Phase Impaired Pharyngeal- Pudding Teaspoon -- Pharyngeal -- Pharyngeal- Pudding Cup -- Pharyngeal -- Pharyngeal- Honey Teaspoon -- Pharyngeal -- Pharyngeal- Honey Cup -- Pharyngeal -- Pharyngeal- Nectar Teaspoon -- Pharyngeal -- Pharyngeal- Nectar Cup Reduced tongue base retraction;Pharyngeal residue - valleculae;Penetration/Aspiration before swallow Pharyngeal Material enters airway, CONTACTS cords and not ejected out Pharyngeal- Nectar Straw Reduced tongue base retraction;Pharyngeal residue - valleculae Pharyngeal -- Pharyngeal- Thin Teaspoon -- Pharyngeal -- Pharyngeal- Thin Cup Reduced tongue base retraction;Pharyngeal residue - valleculae;Penetration/Aspiration before swallow Pharyngeal Material enters airway, CONTACTS cords and not ejected out Pharyngeal- Thin Straw -- Pharyngeal -- Pharyngeal- Puree Reduced tongue base retraction;Pharyngeal residue - valleculae Pharyngeal -- Pharyngeal- Mechanical Soft  Reduced tongue base retraction;Pharyngeal residue - valleculae Pharyngeal -- Pharyngeal- Regular -- Pharyngeal -- Pharyngeal- Multi-consistency -- Pharyngeal -- Pharyngeal- Pill -- Pharyngeal -- Pharyngeal Comment --  CHL IP CERVICAL ESOPHAGEAL PHASE 08/24/2017 Cervical Esophageal Phase WFL Pudding Teaspoon -- Pudding Cup -- Honey Teaspoon -- Honey Cup -- Nectar Teaspoon -- Nectar Cup -- Nectar Straw -- Thin Teaspoon -- Thin Cup -- Thin Straw -- Puree -- Mechanical Soft -- Regular -- Multi-consistency -- Pill -- Cervical Esophageal Comment -- No flowsheet data found. Maxcine Ham 08/24/2017, 2:49 PM  Maxcine Ham, M.A. CCC-SLP [phone]             Mr Maxine Glenn Head/brain HQ Cm  Result Date: 08/23/2017 CLINICAL DATA:  Unresponsive.  Abnormal head CT. EXAM: MRI HEAD WITHOUT CONTRAST MRA HEAD WITHOUT CONTRAST TECHNIQUE: Multiplanar, multiecho pulse sequences of the brain and surrounding structures were obtained without intravenous contrast. Angiographic images of the head were obtained using MRA technique without contrast. COMPARISON:  Head CT from yesterday FINDINGS: MRI HEAD FINDINGS Brain: Large area of acute infarct in the left MCA territory affecting the insula, frontotemporal operculum, inferolateral frontal lobe, and posterior temporal/parietal cortex. Infarcted striatum and anterior limb internal capsule with basal ganglia petechial hemorrhage. Cytotoxic edema causes up to 5 mm of midline shift. Probable small remote left inferior cerebellar infarct. No hydrocephalus or masslike findings. Vascular: Arterial findings below. Normal dural venous sinus flow voids. Skull and upper cervical spine: Negative for marrow lesion Sinuses/Orbits: No acute finding. MRA HEAD FINDINGS Symmetric carotid arteries that are patent. Bilateral P1 hypoplasia. Flow gap at the distal left M1 segment with under opacification of the less numerous left MCA branches. Accounting for artifact, no stenosis or branch occlusion seen  within bilateral ACA and right MCA vessels. Small vertebrobasilar vessels in the setting of fetal type PCAs. There is asymmetric poor signal within the right PCA beginning at the P2 segment, where there may be an underlying severe stenosis. No aneurysm findings. IMPRESSION: Brain MRI: 1. Large acute left MCA territory infarct most  confluent along the insula, opercula, and basal ganglia. No evidence of infarct progression compared to head CT yesterday. 2. Petechial hemorrhage is seen within the left striatum. 3. Cytotoxic edema causes 5 mm of midline shift. Intracranial MRA: 1. Distal left M1 apparent occlusion with flow gap. 2. Poor flow in the right PCA beginning at the P2 segment, presumably from underlying advanced stenosis. Electronically Signed   By: Marnee Spring M.D.   On: 08/23/2017 10:22      Subjective: Nods appropriately to simple questions. Denies pain. As per nursing, patient did well yesterday, was able to feed himself with his left hand and no acute issues reported.  Discharge Exam:  Vitals:   09/05/17 0336 09/05/17 0400 09/05/17 0815 09/05/17 1213  BP: 103/72 (!) 89/64 106/72 97/71  Pulse:   (!) 48 (!) 41  Resp: 15 18 14 14   Temp: 98.1 F (36.7 C)  97.9 F (36.6 C) 97.9 F (36.6 C)  TempSrc: Oral  Oral Oral  SpO2: 96%  100% 100%  Weight:      Height:        Gen. exam: Pleasant middle-aged male, moderately built and nourished lying comfortably propped up in bed. Does not appear in any distress.  Respiratory system: Clear to auscultation. No increased work of breathing. Cardiovascular system: S1 and S2 heard, regular bradycardia. No JVD, murmurs or pedal edema. Telemetry: Sinus bradycardia in the 40's-50's. Abdominal exam: Nondistended, soft and nontender. Normal bowel sounds heard. Condom catheter +. CNS: Aphasic. Facial droop to the left, diminished right nasolabial fold. Extremities: Grade 2 x 5 power in right upper extremity and 3 x 5 power in right lower extremity.  Left extremities 5 x 5 power. Surgical sites bilateral legs, staples intact without acute findings. Dopplerable dorsalis pedis.    The results of significant diagnostics from this hospitalization (including imaging, microbiology, ancillary and laboratory) are listed below for reference.      Labs: CBC:  Recent Labs Lab 08/31/17 0714 09/01/17 0307 09/02/17 0352 09/03/17 0457 09/04/17 0355  WBC 12.1* 10.7* 10.6* 11.7* 11.5*  HGB 9.4* 7.8* 8.4* 8.9* 8.9*  HCT 28.6* 23.6* 25.9* 26.7* 26.6*  MCV 90.5 91.5 91.8 91.1 92.4  PLT 184 180 204 230 279   Basic Metabolic Panel:  Recent Labs Lab 08/30/17 0321 09/02/17 0352 09/03/17 0457 09/04/17 0355  NA 135 136 134* 136  K 4.0 3.8 3.9 4.1  CL 106 104 102 101  CO2 25 25 26 28   GLUCOSE 99 97 91 91  BUN 17 13 15 16   CREATININE 1.06 1.12 1.16 1.23  CALCIUM 8.0* 8.2* 8.5* 8.4*  MG  --  1.8 1.9  --    Urinalysis    Component Value Date/Time   COLORURINE YELLOW 08/25/2017 1026   APPEARANCEUR HAZY (A) 08/25/2017 1026   LABSPEC 1.016 08/25/2017 1026   PHURINE 5.0 08/25/2017 1026   GLUCOSEU NEGATIVE 08/25/2017 1026   HGBUR LARGE (A) 08/25/2017 1026   BILIRUBINUR NEGATIVE 08/25/2017 1026   KETONESUR NEGATIVE 08/25/2017 1026   PROTEINUR 30 (A) 08/25/2017 1026   NITRITE NEGATIVE 08/25/2017 1026   LEUKOCYTESUR MODERATE (A) 08/25/2017 1026    Discussed with patients step brother via phone today. Updated care and answered questions.  Time coordinating discharge: Over 30 minutes  SIGNED:  Marcellus Scott, MD, FACP, FHM. [facility] Pager [phone]  If 7PM-7AM, please contact night-coverage [email or web address] Password [facility] 09/05/2017, 12:34 PM

## 2017-09-05 NOTE — Telephone Encounter (Signed)
-----   Message from Sharee Pimple, RN sent at 09/04/2017  3:35 PM EDT ----- Regarding: 2-3 weeks staple removal   ----- Message ----- From: Sherren Kerns, MD Sent: 09/04/2017   3:24 PM To: Vvs Charge Pool  Pt needs follow up in 2-3 weeks for staple removal  He can go in brackets on the [employer]

## 2017-09-05 NOTE — Plan of Care (Signed)
Problem: Education: Goal: Knowledge of [facility] Education information/materials will improve Outcome: Adequate for Discharge Instructed patient goals for rehab at discharge.   1) Improve time up in chair 2) Improve independency with ADLs including bathing, dressing, etc 3) Assess ability to talk  4) Assess ability to walk and further assessments to enhance his independence.     Patient appeared to be excited for his discharge with smiles and winks.

## 2017-09-05 NOTE — Progress Notes (Signed)
Patient will discharge to [facility] Anticipated discharge date:9/18 Family notified: pt brother Transportation by [employer]- called at 2:30pm but they are behind- likely will be close to 4-4:30pm before pick up.  CSW signing off.  Burna Sis, LCSW Clinical Social Worker [phone]

## 2017-09-05 NOTE — Clinical Social Work Placement (Signed)
   CLINICAL SOCIAL WORK PLACEMENT  NOTE  Date:  09/05/2017  Patient Details  Name: [name] [name] MRN: [id number] Date of Birth: 1951/07/25  Clinical Social Work is seeking post-discharge placement for this patient at the Skilled  Nursing Facility level of care (*CSW will initial, date and re-position this form in  chart as items are completed):  Yes   Patient/family provided with [facility] Clinical Social Work Department's list of facilities offering this level of care within the geographic area requested by the patient (or if unable, by the patient's family).  Yes   Patient/family informed of their freedom to choose among providers that offer the needed level of care, that participate in Medicare, Medicaid or managed care program needed by the patient, have an available bed and are willing to accept the patient.  Yes   Patient/family informed of [facility]'s ownership interest in [facility] and [facility], as well as of the fact that they are under no obligation to receive care at these facilities.  PASRR submitted to EDS on 08/29/17     PASRR number received on 08/29/17     Existing PASRR number confirmed on       FL2 transmitted to all facilities in geographic area requested by pt/family on 08/29/17     FL2 transmitted to all facilities within larger geographic area on       Patient informed that his/her managed care company has contracts with or will negotiate with certain facilities, including the following:        Yes   Patient/family informed of bed offers received.  Patient chooses bed at [facility]     Physician recommends and patient chooses bed at      Patient to be transferred to [facility] at [place] on 09/05/17.  Patient to be transferred to facility by ptar     Patient family notified on 09/05/17 of transfer.  Name of family member notified:  [name]     PHYSICIAN Please sign DNR, Please sign FL2     Additional Comment:     _______________________________________________ Burna Sis, LCSW 09/05/2017, 2:45 PM

## 2017-09-05 NOTE — Care Management Note (Signed)
Case Management Note  Patient Details  Name: [name] [name] MRN: [id number] Date of Birth: [birth year]  Subjective/Objective:   Presents with CVA with bradycardia, cards consulted. NCM [facility] at 3pm ,if patient still in hospital will need to reschedule.   9/10 [name] Cape RN, BSN- large left MCA infarct, bradycardia, afib with rvr, rhabo,aki, s/p bilateral embolectomy iliac femoral popiliteal 9/8, for fasciotomy closure today, conts on hep drip and ivf's.   Plan for SNF.    9/12 1516 [name] Cape RN, BSN - POD 2 embolectomy. conts on heparin drip, amio, and ivf's, plan is SNF.  Discussed in LOS , appropriate for continued stay.     9/14 1524 [name] Cape RN, BSN -no RX coverage per benefit check for eliquis. NCM will leave 30 day coupon in room with patient.  Plan is for SNF  9/18 1208 [name] Cape RN, BSN - patient for dc to SNF today.                 Action/Plan:   Expected Discharge Date:  09/05/17               Expected Discharge Plan:  Skilled Nursing Facility  In-House Referral:  Clinical Social Work  Discharge planning Services  CM Consult  Post Acute Care Choice:    Choice offered to:     DME Arranged:    DME Agency:     HH Arranged:    HH Agency:     Status of Service:  Completed, signed off  If discussed at [employer], dates discussed:    Additional Comments:  [name] Haven, RN 09/05/2017, 12:08 PM

## 2017-09-05 NOTE — Progress Notes (Signed)
Physical Therapy Treatment Patient Details Name: [name] [name] MRN: [id number] DOB: [birth year] Today's Date: 09/05/2017    History of Present Illness Patient is a 66 y/o male who was found down at home covered in urine. 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 No PMH as pt has not been to the doctor. Noted bilateral leg ischemia on 9/8; to OR for Bilateral embolectomy iliac femoral popliteal, 4 compartment fasciotomy bilaterally that evening, with closure of fasciotomies 08/28/17.    PT Comments    Pt making steady progress. Motivated to work toward more independence. Expect he will continue to make good progress with further rehab.   Follow Up Recommendations  SNF     Equipment Recommendations  Other (comment) (To be assised at next venue)    Recommendations for Other Services       Precautions / Restrictions Precautions Precautions: Fall Precaution Comments: RUE hemiparesis, right inattention, aphasia Restrictions Weight Bearing Restrictions: No    Mobility  Bed Mobility Overal bed mobility: Needs Assistance Bed Mobility: Supine to Sit     Supine to sit: Mod assist     General bed mobility comments: Assist to bring RLE off of bed, elevate trunk into sitting, and bring hips to EOB.  Transfers Overall transfer level: Needs assistance Equipment used: Rolling walker (2 wheeled);Ambulation equipment used;2 person hand held assist Transfers: Sit to/from Stand Sit to Stand: Mod assist;+2 physical assistance Stand pivot transfers:  (Used Stedy to transfer bed to chair.)       General transfer comment: Assist to bring hips and trunk up and to block rt knee from buckling.   Ambulation/Gait             General Gait Details: unable   Stairs            Wheelchair Mobility    Modified Rankin (Stroke Patients Only) Modified Rankin (Stroke Patients Only) Pre-Morbid Rankin Score: No symptoms Modified Rankin: Severe disability      Balance Overall balance assessment: Needs assistance Sitting-balance support: Feet supported;No upper extremity supported Sitting balance-Leahy Scale: Fair     Standing balance support: Single extremity supported Standing balance-Leahy Scale: Poor Standing balance comment: Pt stood x 5. Used BUE support, used walker, and used stedy. With [name] pt requiring mod assist and with others +2 mod assist. Blocking rt knee. Verbal/ tactile cues to extend hips, trunk, and to look up. Pt with significant lt lean at times                            Cognition Arousal/Alertness: Awake/alert Behavior During Therapy: Flat affect Overall Cognitive Status: Difficult to assess Area of Impairment: Following commands;Problem solving                   Current Attention Level: Sustained   Following Commands: Follows multi-step commands consistently     Problem Solving: Slow processing;Difficulty sequencing;Requires verbal cues        Exercises Other Exercises Other Exercises: Worked on pregait standing weight shift.    General Comments        Pertinent Vitals/Pain Pain Assessment: Faces Faces Pain Scale: No hurt    Home Living                      Prior Function            PT Goals (current goals can now be found in the care plan  section) Progress towards PT goals: Progressing toward goals    Frequency    Min 3X/week      PT Plan Current plan remains appropriate;Frequency needs to be updated    Co-evaluation              AM-PAC PT "6 Clicks" Daily Activity  Outcome Measure  Difficulty turning over in bed (including adjusting bedclothes, sheets and blankets)?: A Lot Difficulty moving from lying on back to sitting on the side of the bed? : Unable Difficulty sitting down on and standing up from a chair with arms (e.g., wheelchair, bedside commode, etc,.)?: Unable Help needed moving to and from a bed to chair (including a wheelchair)?: A  Lot Help needed walking in [name] room?: Total Help needed climbing 3-5 steps with a railing? : Total 6 Click Score: 8    End of Session Equipment Utilized During Treatment: Gait belt Activity Tolerance: Patient tolerated treatment well Patient left: in chair;with call bell/phone within reach;with chair alarm set Nurse Communication: Mobility status;Need for lift equipment [place] Dec stedy helpful) PT Visit Diagnosis: Hemiplegia and hemiparesis;Unsteadiness on feet (R26.81);Difficulty in walking, not elsewhere classified (R26.2) Hemiplegia - Right/Left: Right Hemiplegia - dominant/non-dominant: Dominant Hemiplegia - caused by: Cerebral infarction     Time: 4540-9811 PT Time Calculation (min) (ACUTE ONLY): 18 min  Charges:  $Therapeutic Activity: 8-22 mins                    G Codes:       [name] [name] PT [phone]    [name] [name] 09/05/2017, 12:33 PM

## 2017-09-07 DIAGNOSIS — I5043 Acute on chronic combined systolic (congestive) and diastolic (congestive) heart failure: Secondary | ICD-10-CM | POA: Diagnosis not present

## 2017-09-07 DIAGNOSIS — E785 Hyperlipidemia, unspecified: Secondary | ICD-10-CM | POA: Diagnosis not present

## 2017-09-07 DIAGNOSIS — I679 Cerebrovascular disease, unspecified: Secondary | ICD-10-CM | POA: Diagnosis not present

## 2017-09-07 DIAGNOSIS — Z8673 Personal history of transient ischemic attack (TIA), and cerebral infarction without residual deficits: Secondary | ICD-10-CM | POA: Diagnosis not present

## 2017-09-11 ENCOUNTER — Ambulatory Visit: Payer: Self-pay | Admitting: Family Medicine

## 2017-09-15 ENCOUNTER — Encounter: Payer: Self-pay | Admitting: Cardiology

## 2017-09-20 DIAGNOSIS — I4891 Unspecified atrial fibrillation: Secondary | ICD-10-CM | POA: Diagnosis not present

## 2017-09-20 DIAGNOSIS — Z9889 Other specified postprocedural states: Secondary | ICD-10-CM | POA: Diagnosis not present

## 2017-09-20 DIAGNOSIS — I69320 Aphasia following cerebral infarction: Secondary | ICD-10-CM | POA: Diagnosis not present

## 2017-09-20 DIAGNOSIS — I69351 Hemiplegia and hemiparesis following cerebral infarction affecting right dominant side: Secondary | ICD-10-CM | POA: Diagnosis not present

## 2017-09-21 ENCOUNTER — Encounter: Payer: Self-pay | Admitting: Family

## 2017-09-21 ENCOUNTER — Encounter: Payer: Medicare Other | Admitting: Family

## 2017-09-22 ENCOUNTER — Ambulatory Visit: Payer: Medicare Other | Admitting: Family

## 2017-09-22 ENCOUNTER — Encounter: Payer: Self-pay | Admitting: Family

## 2017-09-22 VITALS — BP 122/75 | HR 162 | Temp 97.0°F | Resp 16 | Ht 73.0 in | Wt 178.0 lb

## 2017-09-22 DIAGNOSIS — I779 Disorder of arteries and arterioles, unspecified: Secondary | ICD-10-CM

## 2017-09-22 NOTE — Patient Instructions (Signed)

## 2017-09-22 NOTE — Progress Notes (Signed)
Postoperative Visit   History of Present Illness  [name] [name] is a 66 y.o. year old male who is s/p Right popliteal and tibial endarterectomy with patch angioplasty of right popliteal artery and tibial peroneal trunk on 08-30-17 by Dr. 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 He is also s/p Bilateral embolectomy iliac femoral popliteal, 4 compartment fasciotomy bilaterally on 08-26-17 by Dr. Darrick Penna for Bilateral lower extremity ischemia. Operative findings: Large amount of embolic material iliofemoral popliteal bilaterally #2 right foot posterior tibial dorsalis pedis Doppler signal at conclusion of case, left foot posterior tibial Doppler signal.  Dr. Darrick Penna performed closure of fasciotomies on 08-28-17.   He returns today for 2-3 weeks postoperative check; pt's sister states that staples were removed yesterday, apparently at the nursing facility, steri strips are in place in the bilateral calf incisions of both legs.  He is not able to speak much since his stroke.  He does seem to comprehend. He denies fever or chills, denies pain.   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 He takes Eliquis.   He does not have DM and has never used tobacco.   Carotid duplex on 08-23-17 showed minimal plaque in bilateral ICA (review of records).     For VQI Use Only  PRE-ADM LIVING: Nursing home, [facility] [place]  AMB STATUS: Wheelchair   Past Medical History:  Diagnosis Date  . Afib (HCC)   . Peripheral vascular disease (HCC)   . Stroke [facility])    Aphasia, right sided weakness     Past Surgical History:  Procedure Laterality Date  . EMBOLECTOMY Right 08/30/2017   Procedure: Right popliteal tibial endarectomy with patch angioplasty;  Surgeon: Sherren Kerns, MD;  Location: [facility] OR;  Service: Vascular;  Laterality: Right;  . FASCIOTOMY CLOSURE Bilateral 08/28/2017   Procedure: FASCIOTOMY  CLOSURE/BILATERAL  lower legs;  Surgeon: Sherren Kerns, MD;  Location: [facility] OR;  Service: Vascular;  Laterality: Bilateral;  . FEMORAL-POPLITEAL BYPASS GRAFT Bilateral 08/26/2017   Procedure: Bilateral FEMORAL EMBOLECTOMY with  BILATERAL Four Compartment  FASCIOTOMIES.;  Surgeon: Sherren Kerns, MD;  Location: [facility] OR;  Service: Vascular;  Laterality: Bilateral;    Social History   Social History  . Marital status: Single    Spouse name: N/A  . Number of children: N/A  . Years of education: N/A   Occupational History  . Not on file.   Social History Main Topics  . Smoking status: Never Smoker  . Smokeless tobacco: Never Used  . Alcohol use No  . Drug use: No  . Sexual activity: Not on file   Other Topics Concern  . Not on file   Social History Narrative  . No narrative on file    No Known Allergies  Current Outpatient Prescriptions on File Prior to Visit  Medication Sig Dispense Refill  . acetaminophen (TYLENOL) 325 MG tablet Take 2 tablets (650 mg total) by mouth every 6 (six) hours as needed for mild pain, moderate pain, fever or headache.    Marland Kitchen apixaban (ELIQUIS) 5 MG TABS tablet Take 1 tablet (5 mg total) by mouth 2 (two) times daily.    Marland Kitchen atorvastatin (LIPITOR) 20 MG tablet Take 1 tablet (20 mg total) by mouth daily at 6 PM.    . docusate sodium (COLACE) 100 MG capsule Take 1 capsule (100 mg total) by mouth daily.    . Maltodextrin-Xanthan Gum (  RESOURCE THICKENUP CLEAR) POWD Oral, as needed.    . midodrine (PROAMATINE) 10 MG tablet Take 1 tablet (10 mg total) by mouth 3 (three) times daily with meals.     No current facility-administered medications on file prior to visit.      Physical Examination  Vitals:   09/22/17 1439  BP: 122/75  Pulse: (!) 162  Resp: 16  Temp: (!) 97 F (36.1 C)  TempSrc: Oral  SpO2: 90%  Weight: 178 lb (80.7 kg)  Height:  (1.854 m)   Body mass index is 23.48 kg/m.  Bilateral DP and PT pulses are audible by Doppler.    Bilateral groin and lower legs incisions are healing well with incision edges well proximated, and no signs of infection.  All toes of both feet are pink and warm with brisk capillary refill.    Medical Decision Making  [name] [name] is a 66 y.o. year old male who presents s/p  Right popliteal and tibial endarterectomy with patch angioplasty of right popliteal artery and tibial peroneal trunk on 08-30-17 by Dr. 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 He is also s/p Bilateral embolectomy iliac femoral popliteal, 4 compartment fasciotomy bilaterally on 08-26-17 by Dr. Darrick Penna for Bilateral lower extremity ischemia. Return in 2 months with bilateral iliac artery duplex, bilateral LE arterial duplex, and ABI's, see e on a day that Dr. Darrick Penna is in the office.   Bilateral groin and calf fasciotomy incisions are healing well.  He continues with rehab at the nursing facility since his stroke last month.    Thank you for allowing [place] to participate in this patient's care.  NICKEL, Carma Lair, RN, MSN, FNP-C [facility] Office: [phone]  09/22/2017, 2:42 PM  Clinic MD: Cain/Chen

## 2017-09-26 NOTE — Progress Notes (Signed)
Cardiology Office Note   Date:  09/27/2017   ID:  Jahmeek, Shirk 1951/09/07, MRN [id number]  PCP:  Patient, No Pcp Per  Cardiologist:   Rollene Rotunda, MD    Chief Complaint  Patient presents with  . Atrial Fibrillation      History of Present Illness: [name] [name] is a 66 y.o. male who presents for follow up of atrial fib    He was admitted in early Sept with large left frontoparietal CVA in a left MCA distribution.   He was found to have atrial fib.  He was treated with Eliquis.  Of note he developed lower extremity emboli and required emergent embolectomy.   After initial bradycardia he developed atrial fib but had to be taken off of amiodarone because of recurrent significant bradycardia.  He refused a pacemaker.  I reviewed the hospital records for this appt.  He took himself out of rehab yesterday.  He is now back home with his sister and mother.  He cannot speak and can shuffle with a walker.  It is very difficult to get a history from him.  His BP is low but it is not clear that he has any symptoms related to this.  It doesn't seem that is passed out. He is sitting upright in no distress in a chair. He's not describing shortness of breath or chest pain. He's not noticed any palpitations.     Past Medical History:  Diagnosis Date  . Afib (HCC)   . Peripheral vascular disease (HCC)   . Stroke [facility])    Aphasia, right sided weakness     Past Surgical History:  Procedure Laterality Date  . EMBOLECTOMY Right 08/30/2017   Procedure: Right popliteal tibial endarectomy with patch angioplasty;  Surgeon: Sherren Kerns, MD;  Location: [facility] OR;  Service: Vascular;  Laterality: Right;  . FASCIOTOMY CLOSURE Bilateral 08/28/2017   Procedure: FASCIOTOMY CLOSURE/BILATERAL  lower legs;  Surgeon: Sherren Kerns, MD;  Location: [facility] OR;  Service: Vascular;  Laterality: Bilateral;  . FEMORAL-POPLITEAL BYPASS GRAFT Bilateral 08/26/2017   Procedure: Bilateral FEMORAL EMBOLECTOMY  with  BILATERAL Four Compartment  FASCIOTOMIES.;  Surgeon: Sherren Kerns, MD;  Location: [facility] OR;  Service: Vascular;  Laterality: Bilateral;     Current Outpatient Prescriptions  Medication Sig Dispense Refill  . acetaminophen (TYLENOL) 325 MG tablet Take 2 tablets (650 mg total) by mouth every 6 (six) hours as needed for mild pain, moderate pain, fever or headache.    Marland Kitchen apixaban (ELIQUIS) 5 MG TABS tablet Take 1 tablet (5 mg total) by mouth 2 (two) times daily. 60 tablet 11  . atorvastatin (LIPITOR) 20 MG tablet Take 1 tablet (20 mg total) by mouth daily at 6 PM. 30 tablet 11  . docusate sodium (COLACE) 100 MG capsule Take 1 capsule (100 mg total) by mouth daily.    . Maltodextrin-Xanthan Gum (RESOURCE THICKENUP CLEAR) POWD Oral, as needed.    . midodrine (PROAMATINE) 10 MG tablet Take 1 tablet (10 mg total) by mouth 3 (three) times daily with meals. 90 tablet 11   No current facility-administered medications for this visit.     Allergies:   Patient has no known allergies.     ROS:  Please see the history of present illness.   Otherwise, review of systems are positive for none. (Unable to obtain secondary to aphasia)  All other systems are reviewed and negative.    PHYSICAL EXAM: VS:  BP (!) 80/58  Pulse (!) 48   Ht  (1.854 m)   Wt 175 lb 3.2 oz (79.5 kg)   SpO2 92%   BMI 23.11 kg/m  , BMI Body mass index is 23.11 kg/m. GEN:  No distress NECK:  No jugular venous distention at 90 degrees, waveform within normal limits, carotid upstroke brisk and symmetric, no bruits, no thyromegaly LYMPHATICS:  No cervical adenopathy LUNGS:  Clear to auscultation bilaterally BACK:  No CVA tenderness CHEST:  Unremarkable HEART:  S1 and S2 within normal limits, no S3,  no clicks, no rubs, no murmurs ABD:  Positive bowel sounds normal in frequency in pitch, no bruits, no rebound, no guarding, unable to assess midline mass or bruit with the patient seated. EXT:  2 plus pulses throughout,  moderate edema, no cyanosis no clubbing SKIN:  No rashes no nodules NEURO:  Cranial nerves II through XII grossly intact, motor grossly intact throughout PSYCH:  Cognitively intact, oriented to person place and time    EKG:  EKG is ordered today. The ekg ordered today demonstrates atrial fibrillation rate 146, axis within normal limits, QTC prolonged, early transition, nonspecific diffuse T wave changes.    Recent Labs: 08/23/2017: ALT 26 08/30/2017: TSH 1.627 09/03/2017: Magnesium 1.9 09/04/2017: BUN 16; Creatinine, Ser 1.23; Hemoglobin 8.9; Platelets 279; Potassium 4.1; Sodium 136    Lipid Panel    Component Value Date/Time   CHOL 149 08/23/2017 0425   TRIG 102 08/23/2017 0425   HDL 51 08/23/2017 0425   CHOLHDL 2.9 08/23/2017 0425   VLDL 20 08/23/2017 0425   LDLCALC 78 08/23/2017 0425      Wt Readings from Last 3 Encounters:  09/27/17 175 lb 3.2 oz (79.5 kg)  09/22/17 178 lb (80.7 kg)  08/30/17 178 lb (80.7 kg)      Other studies Reviewed: Additional studies/ records that were reviewed today include: Hospital records. Review of the above records demonstrates:  Please see elsewhere in the note.     ASSESSMENT AND PLAN:    ATRIAL FIB:  He is back in atrial fibrillation. His rate is somewhat elevated. He's had some bradycardia rates as well. His blood pressure won't allow med titration. He refused a pacemaker in the past. He's not wanted further hospitalization. May try digoxin 0.125 mg daily that he'll need to be monitored closely. I like him to get follow-up with one of our APPs next week. He probably was bluntly seen in [place].  He's going to be restarted on his own was and I reiterated the importance of not being off this medicine. I will check some blood work today for CBC. He'll also get a basic metabolic profile.  TACHYBRADY SYNDROME:  As above  EXTENSIVE LOWER EXTREMITY CLOT:  I will restart the Elavil was.  HYPOTENSION:   He will be restarted on Midodrine.      CVA:  He did not have follow up with neurology and we will try to arrange this.  FOLLOW UP:  He did get to his appt with Joaquin Courts and we rescheduled this.       Current medicines are reviewed at length with the patient today.  The patient does not have concerns regarding medicines.  The following changes have been made:  no change  Labs/ tests ordered today include:   Orders Placed This Encounter  Procedures  . CBC  . Basic Metabolic Panel (BMET)  . EKG 12-Lead     Disposition:   FU with APP in [place]  Signed, Rollene Rotunda, MD  09/27/2017 9:51 AM    [facility]

## 2017-09-27 ENCOUNTER — Ambulatory Visit (INDEPENDENT_AMBULATORY_CARE_PROVIDER_SITE_OTHER): Payer: Medicare Other | Admitting: Neurology

## 2017-09-27 ENCOUNTER — Encounter: Payer: Self-pay | Admitting: Neurology

## 2017-09-27 ENCOUNTER — Encounter: Payer: Self-pay | Admitting: Cardiology

## 2017-09-27 ENCOUNTER — Ambulatory Visit (INDEPENDENT_AMBULATORY_CARE_PROVIDER_SITE_OTHER): Payer: Self-pay | Admitting: Cardiology

## 2017-09-27 ENCOUNTER — Encounter: Payer: Self-pay | Admitting: Internal Medicine

## 2017-09-27 VITALS — BP 90/57 | HR 126 | Wt 174.0 lb

## 2017-09-27 VITALS — BP 80/58 | HR 48 | Ht 73.0 in | Wt 175.2 lb

## 2017-09-27 DIAGNOSIS — I749 Embolism and thrombosis of unspecified artery: Secondary | ICD-10-CM

## 2017-09-27 DIAGNOSIS — I480 Paroxysmal atrial fibrillation: Secondary | ICD-10-CM

## 2017-09-27 DIAGNOSIS — Z79899 Other long term (current) drug therapy: Secondary | ICD-10-CM

## 2017-09-27 DIAGNOSIS — I959 Hypotension, unspecified: Secondary | ICD-10-CM

## 2017-09-27 DIAGNOSIS — R4701 Aphasia: Secondary | ICD-10-CM

## 2017-09-27 MED ORDER — APIXABAN 5 MG PO TABS: 5.0000 mg | ORAL_TABLET | Freq: Two times a day (BID) | ORAL | 11 refills | Status: DC

## 2017-09-27 MED ORDER — MIDODRINE HCL 10 MG PO TABS: 10.0000 mg | ORAL_TABLET | Freq: Three times a day (TID) | ORAL | 11 refills | Status: DC

## 2017-09-27 MED ORDER — ATORVASTATIN CALCIUM 20 MG PO TABS: 20.0000 mg | ORAL_TABLET | Freq: Every day | ORAL | 11 refills | Status: DC

## 2017-09-27 NOTE — Progress Notes (Signed)
[facility] [address] [place]. [place] [address] [phone]       OFFICE FOLLOW-UP NOTE  [name]. [name] [name] Date of Birth:  10/27/1951 Medical Record Number:  [id number]   HPI: [name] [name] is a 29 year Caucasian male seen today for first office follow-up visit following hospital admission for stroke in September 2018. He is a complaint by his sister and friend. History is obtained from them as well as review of electronic medical records. I have personally reviewed imaging films. [name] [name] a 65 y.o.malewith no documented medical history presenting with right-sided weakness and expressive aphasia.  He has no home meds and intially presented to [facility] with subacute L MCA infarct, and was transferred to [facility] out of concern for cerebral edema.  He is currently aphasic with right-sided weakness.  [name] to the ER at [facility] on 9/4 by EMS after being found on the ground, flaccid to the right side andlaying in his ownurine at his home. A family member went to check on him after not hearing from him for two days and found him this way. His lives with his elderly mother who he has been caring after she had a stroke. ER evaluation revealed a large subacute Left MCA CVA with 5 mm midline shift and hew as noted to be bradycardic down into the 30s.  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 He does not see doctors, take any medications or have any known allergies. Hewas transferred by [facility] yesterday because of concern for brainswelling.  LKW: 08/20/17.[name] [name] was not administered IV t-PA secondary to arriving outside of the treatment window. He was admitted for stroke workup. MRI scan of the brain showed a large acute left MCA infarct mostly confluent along the insula, operculum and basal ganglia with petechial hemorrhage and  cytotoxic edema with 5 mm midline shift. MRA of the brain showed occlusion of the left middle cerebral artery in the M1 segment is at [place] atherosclerosis in the right P2 segment with stenosis. Carotid ultrasound showed no significant siphon stenosis. Transfer to get a normal ejection fraction. LDL cholesterol 70 mg percent. Women A1c was 5.2. [name] [name] had significant persistent bradycardia for which cardiology was consulted but a pacemaker. He eventually was found to have atrial fibrillation for which she was started on eliquis. During hospitalization his course was complicated by embolism to lower extremities and he required Bilateral embolectomy iliac of femoral, popliteal, for compartment fasciotomy bilaterally on 08-26-17 by Dr. Darrick Penna for Bilateral lower extremity ischemia. [name] [name] was transferred for rehabilitation to a nursing home but the [name] [name] signed himself out from there. Is presently living at home with his mother and sister. Still has significant expressive aphasia and can barely see even a few words. His complaints in spite of growth. Is up in some improvement in his right-sided strength he can walk with a walker but needs 1 person assist. Can move his right upper extremity proximally but has significant hand and grip weakness yet. [name] [name] plans to see his primary physician Dr. Joaquin Courts soon as well as his cardiologist Dr. Quintin Alto. He has been given coupon and samples of eliquis but feels he will not be able to afford it. He plans to start outpatient therapies soon. ROS:   14 system review of systems is positive for  hearing loss, eye redness, shortness of breath,  walking difficulty, numbness, speech difficulty, weakness, easy bruising and bleeding and all other systems negative  PMH:  Past Medical History:  Diagnosis Date  . Afib (HCC)   . Peripheral vascular disease (HCC)   . Stroke [facility])    Aphasia, right sided weakness     Social History:  Social History   Social History    . Marital status: Single    Spouse name: N/A  . Number of children: N/A  . Years of education: N/A   Occupational History  . Not on file.   Social History Main Topics  . Smoking status: Never Smoker  . Smokeless tobacco: Never Used  . Alcohol use No  . Drug use: No  . Sexual activity: Not on file   Other Topics Concern  . Not on file   Social History Narrative  . No narrative on file    Medications:   Current Outpatient Prescriptions on File Prior to Visit  Medication Sig Dispense Refill  . acetaminophen (TYLENOL) 325 MG tablet Take 2 tablets (650 mg total) by mouth every 6 (six) hours as needed for mild pain, moderate pain, fever or headache.    Marland Kitchen apixaban (ELIQUIS) 5 MG TABS tablet Take 1 tablet (5 mg total) by mouth 2 (two) times daily. 60 tablet 11  . atorvastatin (LIPITOR) 20 MG tablet Take 1 tablet (20 mg total) by mouth daily at 6 PM. 30 tablet 11  . docusate sodium (COLACE) 100 MG capsule Take 1 capsule (100 mg total) by mouth daily.    . Maltodextrin-Xanthan Gum (RESOURCE THICKENUP CLEAR) POWD Oral, as needed.    . midodrine (PROAMATINE) 10 MG tablet Take 1 tablet (10 mg total) by mouth 3 (three) times daily with meals. 90 tablet 11   No current facility-administered medications on file prior to visit.     Allergies:  No Known Allergies  Physical Exam General: well developed, well nourished, seated, in no evident distress Head: head normocephalic and atraumatic.  Neck: supple with no carotid or supraclavicular bruits Cardiovascular: regular rate and rhythm, no murmurs Musculoskeletal: no deformity Skin:  no rash/petichiae Vascular:  Normal pulses all extremities Vitals:   09/27/17 1527  BP: (!) 90/57  Pulse: (!) 126   Neurologic Exam Mental Status: Awake and fully alert.  Severe expressive aphasia and can barely mouth a few words. Good comprehension. Cranial Nerves: Fundoscopic exam reveals sharp disc margins. Pupils equal, briskly reactive to light.  Extraocular movements full without nystagmus. Visual fields blinks to threat more on the left than the right.. Hearing intact. Facial sensation intact. Right lower facial weakness., tongue, palate moves normally and symmetrically.  Motor: Spastic right hemiplegia with 2/5 strength proximally in the right upper extremity and 1/5 distally significant weakness of right grip. 4/5 right lower extremity strength proximally with right ankle dorsiflexor weakness. Tone is increased on the right compared to left. Normal strength tone and reflexes on the left. Sensory.: intact to touch ,pinprick .position and vibratory sensation.  Coordination: Rapid alternating movements abnormal on the right and normal on the left  Gait and Station: Not tested as [name] [name] did not bring his walker and needs 1 person assist Reflexes: 2+ and asymmetric and brisker on the right compared to the left. Toes downgoing.   NIHSS  9 Modified [place]  3  ASSESSMENT:  66 year old Caucasian male with embolic left MCA branch infarct secondary to atrial fibrillation and September 2018 with significant residual expressive aphasia and right hemiplegia.   PLAN: I had a long  d/w [name] [name], his sister and friend about his recent embolic stroke,atrial fibrillation risk for recurrent stroke/TIAs, personally independently reviewed imaging studies and stroke evaluation results and answered questions.Continue Eliquis (apixaban) daily  for secondary stroke prevention and maintain strict control of hypertension with blood pressure goal below 130/90, diabetes with hemoglobin A1c goal below 6.5% and lipids with LDL cholesterol goal below 70 mg/dL. I also advised the [name] [name] to eat a healthy diet with plenty of whole grains, cereals, fruits and vegetables, exercise regularly and maintain ideal body weight .I advised [name] [name] to use a walker at all times for fall and safety cautions. The [name] [name] is having a figure time affording eliquis he may be better off  switching to warfarin for long-term anticoagulation. I advised him to discuss this with his primary physician or cardiologist. Followup in the future with my nurse practitioner in 3 months or call earlier if necessary Greater than 50% of time during this 25 minute visit was spent on counseling,explanation of diagnosis of embolic stroke, atrial fibrillation, planning of further management, discussion with [name] [name] and family and coordination of care Delia Heady, MD Medical Director [facility] Pager: [phone] 09/27/2017 6:08 PM  Note: This document was prepared with digital dictation and possible smart phrase technology. Any transcriptional errors that result from this process are unintentional

## 2017-09-27 NOTE — Patient Instructions (Addendum)
Medication Instructions:  Continue current medications  If you need a refill on your cardiac medications before your next appointment, please call your pharmacy.  Labwork: CBC and BMP HERE IN OUR OFFICE AT [facility]  Testing/Procedures: None Ordered  Follow-Up: Your physician wants you to follow-up in: 2 weeks with APP in [place].    Special Instructions:  You have an appointment schedule to see Dr Joaquin Courts on Tuesday October 23rd at 1:00 pm [facility]  [address] [place], [place] [address]  [phone]    You have an appointment schedule to see Dr Pearlean Brownie today @ 3:00 pm  Office Location & Contact [address] [place], [place] [address]  [phone]  Phone Number   Thank you for choosing [facility]!!

## 2017-09-27 NOTE — Patient Instructions (Signed)
I had a long d/w patient, his sister and friend about his recent embolic stroke,atrial fibrillation risk for recurrent stroke/TIAs, personally independently reviewed imaging studies and stroke evaluation results and answered questions.Continue Eliquis (apixaban) daily  for secondary stroke prevention and maintain strict control of hypertension with blood pressure goal below 130/90, diabetes with hemoglobin A1c goal below 6.5% and lipids with LDL cholesterol goal below 70 mg/dL. I also advised the patient to eat a healthy diet with plenty of whole grains, cereals, fruits and vegetables, exercise regularly and maintain ideal body weight .I advised patient to use a walker at all times for fall and safety cautions. The patient is having a figure time affording eliquis he may be better off switching to warfarin for long-term anticoagulation. I advised him to discuss this with his primary physician or cardiologist. Followup in the future with my nurse practitioner in 3 months or call earlier if necessary Stroke Prevention Some medical conditions and behaviors are associated with an increased chance of having a stroke. You may prevent a stroke by making healthy choices and managing medical conditions. How can I reduce my risk of having a stroke?  Stay physically active. Get at least 30 minutes of activity on most or all days.  Do not smoke. It may also be helpful to avoid exposure to secondhand smoke.  Limit alcohol use. Moderate alcohol use is considered to be: ? No more than 2 drinks per day for men. ? No more than 1 drink per day for nonpregnant women.  Eat healthy foods. This involves: ? Eating 5 or more servings of fruits and vegetables a day. ? Making dietary changes that address high blood pressure (hypertension), high cholesterol, diabetes, or obesity.  Manage your cholesterol levels. ? Making food choices that are high in fiber and low in saturated fat, trans fat, and cholesterol may control  cholesterol levels. ? Take any prescribed medicines to control cholesterol as directed by your health care provider.  Manage your diabetes. ? Controlling your carbohydrate and sugar intake is recommended to manage diabetes. ? Take any prescribed medicines to control diabetes as directed by your health care provider.  Control your hypertension. ? Making food choices that are low in salt (sodium), saturated fat, trans fat, and cholesterol is recommended to manage hypertension. ? Ask your health care provider if you need treatment to lower your blood pressure. Take any prescribed medicines to control hypertension as directed by your health care provider. ? If you are 55-15 years of age, have your blood pressure checked every 3-5 years. If you are 55 years of age or older, have your blood pressure checked every year.  Maintain a healthy weight. ? Reducing calorie intake and making food choices that are low in sodium, saturated fat, trans fat, and cholesterol are recommended to manage weight.  Stop drug abuse.  Avoid taking birth control pills. ? Talk to your health care provider about the risks of taking birth control pills if you are over 57 years old, smoke, get migraines, or have ever had a blood clot.  Get evaluated for sleep disorders (sleep apnea). ? Talk to your health care provider about getting a sleep evaluation if you snore a lot or have excessive sleepiness.  Take medicines only as directed by your health care provider. ? For some people, aspirin or blood thinners (anticoagulants) are helpful in reducing the risk of forming abnormal blood clots that can lead to stroke. If you have the irregular heart rhythm of atrial fibrillation, you  should be on a blood thinner unless there is a good reason you cannot take them. ? Understand all your medicine instructions.  Make sure that other conditions (such as anemia or atherosclerosis) are addressed. Get help right away if:  You have  sudden weakness or numbness of the face, arm, or leg, especially on one side of the body.  Your face or eyelid droops to one side.  You have sudden confusion.  You have trouble speaking (aphasia) or understanding.  You have sudden trouble seeing in one or both eyes.  You have sudden trouble walking.  You have dizziness.  You have a loss of balance or coordination.  You have a sudden, severe headache with no known cause.  You have new chest pain or an irregular heartbeat. Any of these symptoms may represent a serious problem that is an emergency. Do not wait to see if the symptoms will go away. Get medical help at once. Call your local emergency services (911 in [place] Do not drive yourself to the hospital. This information is not intended to replace advice given to you by your health care provider. Make sure you discuss any questions you have with your health care provider. Document Released: 01/12/2005 Document Revised: 05/12/2016 Document Reviewed: 06/07/2013 Elsevier Interactive Patient Education  2017 [employer].

## 2017-09-28 LAB — BASIC METABOLIC PANEL
BUN/Creatinine Ratio: 11 (ref 10–24)
BUN: 14 mg/dL (ref 8–27)
CO2: 23 mmol/L (ref 20–29)
CREATININE: 1.24 mg/dL (ref 0.76–1.27)
Calcium: 10.1 mg/dL (ref 8.6–10.2)
Chloride: 99 mmol/L (ref 96–106)
GFR calc Af Amer: 69 mL/min/{1.73_m2} (ref >59)
GFR, EST NON AFRICAN AMERICAN: 59 mL/min/{1.73_m2} — AB (ref >59)
GLUCOSE: 92 mg/dL (ref 65–99)
Potassium: 4.7 mmol/L (ref 3.5–5.2)
SODIUM: 140 mmol/L (ref 134–144)

## 2017-09-28 LAB — CBC
Hematocrit: 46.8 % (ref 37.5–51.0)
Hemoglobin: 15.7 g/dL (ref 13.0–17.7)
MCH: 31.7 pg (ref 26.6–33.0)
MCHC: 33.5 g/dL (ref 31.5–35.7)
MCV: 94 fL (ref 79–97)
Platelets: 275 x10E3/uL (ref 150–379)
RBC: 4.96 x10E6/uL (ref 4.14–5.80)
RDW: 14.8 % (ref 12.3–15.4)
WBC: 8.9 x10E3/uL (ref 3.4–10.8)

## 2017-09-29 NOTE — Addendum Note (Signed)
Addended by: Burton Apley A on: 09/29/2017 09:50 AM   Modules accepted: Orders

## 2017-10-02 ENCOUNTER — Telehealth: Payer: Self-pay | Admitting: *Deleted

## 2017-10-02 NOTE — Telephone Encounter (Signed)
-----   Message from Rollene Rotunda, MD sent at 09/28/2017  6:53 PM EDT ----- Labs OK.  Call Mr. Cavazos with the results and send results to [name], FNP

## 2017-10-10 ENCOUNTER — Ambulatory Visit (INDEPENDENT_AMBULATORY_CARE_PROVIDER_SITE_OTHER): Payer: Self-pay | Admitting: Family Medicine

## 2017-10-10 ENCOUNTER — Encounter: Payer: Self-pay | Admitting: Family Medicine

## 2017-10-10 VITALS — BP 122/84 | HR 90 | Temp 98.9°F | Resp 12 | Ht 73.0 in | Wt 174.6 lb

## 2017-10-10 DIAGNOSIS — I480 Paroxysmal atrial fibrillation: Secondary | ICD-10-CM

## 2017-10-10 DIAGNOSIS — M7989 Other specified soft tissue disorders: Secondary | ICD-10-CM

## 2017-10-10 DIAGNOSIS — I749 Embolism and thrombosis of unspecified artery: Secondary | ICD-10-CM

## 2017-10-10 DIAGNOSIS — I63132 Cerebral infarction due to embolism of left carotid artery: Secondary | ICD-10-CM

## 2017-10-10 DIAGNOSIS — G8191 Hemiplegia, unspecified affecting right dominant side: Secondary | ICD-10-CM

## 2017-10-10 DIAGNOSIS — R1310 Dysphagia, unspecified: Secondary | ICD-10-CM

## 2017-10-10 NOTE — Patient Instructions (Addendum)
I will refer you for home health services for physical therapy and speech therapy. If you have not heard anything 1 week from today, please follow-up with me here in office.   Atrial Fibrillation Atrial fibrillation is a type of heartbeat that is irregular or fast (rapid). If you have this condition, your heart keeps quivering in a weird (chaotic) way. This condition can make it so your heart cannot pump blood normally. Having this condition gives a person more risk for stroke, heart failure, and other heart problems. There are different types of atrial fibrillation. Talk with your doctor to learn about the type that you have. Follow these instructions at home:  Take over-the-counter and prescription medicines only as told by your doctor.  If your doctor prescribed a blood-thinning medicine, take it exactly as told. Taking too much of it can cause bleeding. If you do not take enough of it, you will not have the protection that you need against stroke and other problems.  Do not use any tobacco products. These include cigarettes, chewing tobacco, and e-cigarettes. If you need help quitting, ask your doctor.  If you have apnea (obstructive sleep apnea), manage it as told by your doctor.  Do not drink alcohol.  Do not drink beverages that have caffeine. These include coffee, soda, and tea.  Maintain a healthy weight. Do not use diet pills unless your doctor says they are safe for you. Diet pills may make heart problems worse.  Follow diet instructions as told by your doctor.  Exercise regularly as told by your doctor.  Keep all follow-up visits as told by your doctor. This is important. Contact a doctor if:  You notice a change in the speed, rhythm, or strength of your heartbeat.  You are taking a blood-thinning medicine and you notice more bruising.  You get tired more easily when you move or exercise. Get help right away if:  You have pain in your chest or your belly  (abdomen).  You have sweating or weakness.  You feel sick to your stomach (nauseous).  You notice blood in your throw up (vomit), poop (stool), or pee (urine).  You are short of breath.  You suddenly have swollen feet and ankles.  You feel dizzy.  Your suddenly get weak or numb in your face, arms, or legs, especially if it happens on one side of your body.  You have trouble talking, trouble understanding, or both.  Your face or your eyelid droops on one side. These symptoms may be an emergency. Do not wait to see if the symptoms will go away. Get medical help right away. Call your local emergency services (911 in the [place] Do not drive yourself to the hospital. This information is not intended to replace advice given to you by your health care provider. Make sure you discuss any questions you have with your health care provider. Document Released: 09/13/2008 Document Revised: 05/12/2016 Document Reviewed: 04/01/2015 Elsevier Interactive Patient Education  [employer].    Rehabilitation After a Stroke, Adult A stroke causes damage to the brain cells, which can affect your ability to walk, talk, or remember things. The impact of a stroke is different for everyone, and so is recovery. Some people have progress during the first few days after treatment. Others may take weeks or longer to make progress. Stroke rehabilitation includes a variety of treatments to help you recover and promote your independence after a stroke. You may not be able do everything that you did before  the stroke, but you can learn ways to manage your lifestyle and be as independent as possible. Rehabilitation will start as soon as you are able to participate after your stroke, and it involves care from a team that may include:  Family and friends. Your loved ones know you best and can be very helpful in your recovery.  Physicians.  Nurses.  Physical and occupational therapists.  Speech-language  therapists.  A nutritionist.  A psychologist.  A Child psychotherapist.  Keep open communication with all members of your care team. Share your medical records if needed, and take notes about each provider's recommendations. What is physical therapy? Physical therapists (PTs) help you to improve your coordination, balance, and muscle strength. Physical therapy may involve:  Range of motion exercises.  Help to move between lying, sitting, and standing positions.  Walking with a cane or walker, if needed.  Help using stairs.  What is occupational therapy? Occupational therapists (OTs) help you rebuild your ability to do everyday tasks, such as brushing your teeth, going to the bathroom, eating, and getting dressed. Occupational therapy may also help with:  Vision. Visual scanning is a technique that is used to prevent falls.  Memory and cognitive training. This therapy includes problem-solving techniques and relearning tasks like making a phone call.  Fine muscle movements such as buttoning a shirt or picking up small objects.  What is speech therapy? Speech-language therapists help you communicate. After a stroke, you may have problems understanding what people are saying, or you may have trouble writing, speaking, or finding the right word for what you want to say. You may also need speech therapy if you have difficulty swallowing while eating and drinking. Examples of speech-language therapies include:  Techniques to strengthen muscles used in swallowing.  Naming objects or describing pictures. This helps retrain the brain to recognize and remember words.  Exercises to strengthen the muscles involved in talking, including your tongue and lips.  Exercises to retrain your brain in understanding what you read and hear.  How often will I need therapy? Therapy will begin as soon as you are able to participate, which is often within the first few days after a stroke. Sessions will be  frequent at first. For example, you may have therapy 2-3 hours a day on most days of the week during the first few months. The intensity depends on the type and severity of your stroke. You may need therapy for several months. Therapy may take place in the hospital, at a rehabilitation center, or in your home. Are there any side effects of therapy? Therapy is safe and is usually well-tolerated. You may feel physically and mentally tired after therapy, especially during the first few weeks. Rest before therapy sessions if you need to so you can get the most out of your rehabilitation. Follow these instructions at home:  Involve your family and friends in your recovery, if possible. Having another person to encourage you is beneficial.  Follow instructions from your speech-language therapist, nutritionist, or health care provider about what you can safely eat and drink. Eat healthy foods. If your ability to swallow was affected by the stroke, you may need to take steps to avoid choking, such as: ? Taking small bites when eating. ? Eating foods that are soft or pureed. ? Drinking liquids that have been thickened.  Maintain social connections and interactions with friends, family, and community groups. This is an important part of your recovery. Communication challenges and physical challenges may cause  you to feel isolated after a stroke.  Consider joining a support group that allows you to talk about the impact of stroke on your life. A psychologist or counselor may be recommended. Your emotional recovery from stroke is just as important as your physical recovery.  Keep all follow-up visits as told by your health care providers. This is important. Summary  Stroke rehabilitation includes a variety of treatments to help you recover and promote your independence after a stroke.  Rehabilitation will start as soon as you are able to participate after your stroke, and it includes care from a team of  experts.  The intensity of therapy depends on the type and severity of your stroke. You may need therapy for several months. This information is not intended to replace advice given to you by your health care provider. Make sure you discuss any questions you have with your health care provider. Document Released: 12/25/2007 Document Revised: 12/06/2016 Document Reviewed: 12/06/2016 Elsevier Interactive Patient Education  2017 [employer]

## 2017-10-10 NOTE — Progress Notes (Signed)
Cardiology Office Note    Date:  10/11/2017   ID:  [name], [name] 1950/12/26, MRN [id number]  PCP:  [name], FNP  Cardiologist: Dr. [name]  No chief complaint on file.   History of Present Illness:  [name] [name] is a 66 y.o. 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 He also developed lower extremity emboli and required emergent embolectomy. He was treated with Eliquis  and amiodarone but this was stopped because of bradycardia. He refused pacemaker. He discharged himself from rehabilitation. He saw Dr. [name] in f/u 09/27/17. It was difficult to get a history from him because he can't speak. His blood pressure was low. He was back in atrial fibrillation. His rate was a little high. He doesn't want any further hospitalizations. Was placed on digoxin 0.125 mg daily and scheduled for early follow-up with me. Labs were checked that day creatinine was stable at 1.24 hemoglobin was stable at 15.7 other labs stable.  Patient comes in today accompanied by his brother. He uses hand motions to communicate. Blood pressure is better today. He is on Midodrine but is also eating better.heart rate is 115 bpm today. Patient is asymptomatic. He's had no further dizziness. When he walks he says he just gets really tired and has to sit down. He is starting to walk around the house with a walker. Home health is being arranged.    Past Medical History:  Diagnosis Date  . Afib (HCC)   . Peripheral vascular disease (HCC)   . Stroke [facility])    Aphasia, right sided weakness     Past Surgical History:  Procedure Laterality Date  . EMBOLECTOMY Right 08/30/2017   Procedure: Right popliteal tibial endarectomy with patch angioplasty;  Surgeon: Sherren Kerns, MD;  Location: [facility] OR;  Service: Vascular;  Laterality: Right;  . FASCIOTOMY CLOSURE Bilateral 08/28/2017   Procedure: FASCIOTOMY CLOSURE/BILATERAL   lower legs;  Surgeon: Sherren Kerns, MD;  Location: [facility] OR;  Service: Vascular;  Laterality: Bilateral;  . FEMORAL-POPLITEAL BYPASS GRAFT Bilateral 08/26/2017   Procedure: Bilateral FEMORAL EMBOLECTOMY with  BILATERAL Four Compartment  FASCIOTOMIES.;  Surgeon: Sherren Kerns, MD;  Location: [facility] OR;  Service: Vascular;  Laterality: Bilateral;    Current Medications: Current Meds  Medication Sig  . apixaban (ELIQUIS) 5 MG TABS tablet Take 1 tablet (5 mg total) by mouth 2 (two) times daily.  Marland Kitchen atorvastatin (LIPITOR) 20 MG tablet Take 1 tablet (20 mg total) by mouth daily at 6 PM.  . docusate sodium (COLACE) 100 MG capsule Take 1 capsule (100 mg total) by mouth daily.  . midodrine (PROAMATINE) 10 MG tablet Take 1 tablet (10 mg total) by mouth 3 (three) times daily with meals.     Allergies:   Patient has no known allergies.   Social History   Social History  . Marital status: Single    Spouse name: N/A  . Number of children: N/A  . Years of education: N/A   Social History Main Topics  . Smoking status: Never Smoker  . Smokeless tobacco: Never Used  . Alcohol use No  . Drug use: No  . Sexual activity: Not Asked   Other Topics Concern  . None   Social History Narrative  . None     Family History:  The patient's   family history includes Stroke in his sister.   ROS:   Please see the  history of present illness.    Review of Systems  Reason unable to perform ROS: CVA and aphasic.   All other systems reviewed and are negative.   PHYSICAL EXAM:   VS:  BP 112/64   Pulse 67   Ht 6\' 1"  (1.854 m)   Wt 174 lb 3.2 oz (79 kg)   SpO2 94%   BMI 22.98 kg/m   Physical Exam  GEN: Well nourished, well developed, in no acute distress  Neck: no JVD, carotid bruits, or masses Cardiac:irregular irregular at 115 bpm distant heart sounds  Respiratory:  clear to auscultation bilaterally, normal work of breathing GI: soft, nontender, nondistended, + BS WUJ:WJXBJ hand swollen since his  Cwer extremities without cyanosis, clubbing, or edema, Good distal pulses bilaterally Neuro:  Alert  Psych: euthymic mood, full affect  Wt Readings from Last 3 Encounters:  10/11/17 174 lb 3.2 oz (79 kg)  10/10/17 174 lb 9.6 oz (79.2 kg)  09/27/17 174 lb (78.9 kg)      Studies/Labs Reviewed:   EKG:  EKG is  ordered today.  The ekg ordered today demonstrates atrial fibrillation at 115 bpm with old inferior MI and ST-T wave changes inferior lateral, no acute change from prior tracings  Recent Labs: 08/23/2017: ALT 26 08/30/2017: TSH 1.627 09/03/2017: Magnesium 1.9 09/27/2017: BUN 14; Creatinine, Ser 1.24; Hemoglobin 15.7; Platelets 275; Potassium 4.7; Sodium 140   Lipid Panel    Component Value Date/Time   CHOL 149 08/23/2017 0425   TRIG 102 08/23/2017 0425   HDL 51 08/23/2017 0425   CHOLHDL 2.9 08/23/2017 0425   VLDL 20 08/23/2017 0425   LDLCALC 78 08/23/2017 0425    Additional studies/ records that were reviewed today include:  2-D echo 9/5/18Study Conclusions   - Left ventricle: The cavity size was normal. Wall thickness was   normal. Systolic function was normal. The estimated ejection   fraction was 55%. Wall motion was normal; there were no regional   wall motion abnormalities. Left ventricular diastolic function   parameters were normal. - Aortic valve: Trileaflet; mildly thickened leaflets. There was   trivial regurgitation. - Aorta: Mild aortic root dilatation. Aortic root dimension: 39 mm   (ED). - Mitral valve: There was mild regurgitation. - Left atrium: The atrium was mildly dilated. - Atrial septum: No defect or patent foramen ovale was identified. - Pulmonic valve: There was mild regurgitation.       ASSESSMENT:    1. Cerebrovascular accident (CVA), unspecified mechanism (HCC)   2. PAF (paroxysmal atrial fibrillation) (HCC)   3. Hypotension, unspecified hypotension type   4. Bradycardia      PLAN:  In order of problems listed above:  PAF with  tachycardia bradycardia syndrome.Bradycardia on amiodarone so it was stopped. Recently had fast rates and was started on low dose digoxin 0.125 mg daily by Dr. [name].Has refused pacemaker.heart rate is still up today but blood pressure is better. He is on midodrine for hypotension. We'll try to add low-dose carvedilol 3.125 mg twice a day heart rate control. He has home health nurses checking on him now so they can monitor his  Blood pressure and heart rate. Follow-up here in 2 weeks with EKG.  CVA on Eliquis and atorvastatin  Hypotension on midodrine. He is eating better and more active, blood pressure is better today. Continue midodrine for now.  Bradycardia on amiodarone so it was stopped      Medication Adjustments/Labs and Tests Ordered: Current medicines are reviewed at length with the  patient today.  Concerns regarding medicines are outlined above.  Medication changes, Labs and Tests ordered today are listed in the Patient Instructions below. Patient Instructions  Medication Instructions:  Your physician has recommended you make the following change in your medication:  Start Coreg 3.125 mg Daily    Labwork: NONE   Testing/Procedures: NONE    Follow-Up: Your physician recommends that you schedule a follow-up appointment in: 2 weeks    Any Other Special Instructions Will Be Listed Below (If Applicable). Home Health Nurse to Check Blood Pressure and Pulse Daily     If you need a refill on your cardiac medications before your next appointment, please call your pharmacy.  Thank you for choosing [facility]!      Elson ClanSigned, Michele Lenze, PA-C  10/11/2017 12:12 PM    [facility] [address], [place], [place]  [address] Phone: [phone]; Fax: [phone]

## 2017-10-10 NOTE — Progress Notes (Signed)
Patient ID: [name] [name] Kolbeck, male    DOB: 09/21/1951, 66 y.o.   MRN: [id number]  PCP: Bing NeighborsHarris, Secilia Apps S, FNP  Chief Complaint  Patient presents with  . Establish Care    Subjective:  HPI [name] [name] Lottman is a 66 y.o. male presents to establish care. Lazer reports no prior health conditions or  primary care until recent hospitalization. On 08/22/2017, Mickael presented initially to [facility] after his brother found him lying at home on the floor oriented to name only and unable to speak. CT head showed a large left frontoparietal CVA, MCA distribution. MRI brain revealed fairly large MCA infarct. He was transferred to [facility]  in which he underwent further work-up for CVA. During his hospital stay, he was found to be in atrial fibrillation with RVR. He was treated with IV amiodarone and developed significant bradycardia, although remained asymptomatic. Amiodarone was discontinued. A pacemaker was recommended as patient was unable to tolerated oral antidysrhythmia medication. Patient has refused cardioversion. He was found to have significant PAD with bilateral lower extremity edema. He underwent vascular surgery on 08/26/17 and underwent a  embolectomy of iliac femoral popliteal artery, with fasciotomy. He underwent closure of fasciotomies 08/28/2017. Subsequently on 08/30/2017, he underwent right popliteal and tibial embolectomy. 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 Currently anticoagulated on Eliquis. Makhi suffers from right hemiparesis, dysphagia, and aphasia. He was hospitalized  Gerlene BurdockRichard has no prior history of smoking or alcohol use. 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 At today's visit, he is accompanied by a family friend and his brother. Patient's brother reports, Gerlene BurdockRichard was discharged to [facility] in  [place]. He was initially approved to remain at SNF for 3 months, however, patient's sister removed the patient from facility against medical advise and returned him home. Apparently, adult protective services complaint was filed by the facility and a Child psychotherapistsocial worker visited the residence last week and was to research services available to patient at home. Today, patient is communicating by signing and mouth movements. His right hand is edematous. He is not receiving any physical therapy and he is unable to move or reposition his right upper or lower extremity. At present he ambulates with walker, which he did not bring with him to visit today. He is in a wheelchair today. His brother cares for him throughout the day and assists him to bed at night. Family is requesting home health services in order to resume physical and speech therapy patient was receiving within the facility. Patient denies any chest pain, headaches, shortness of breath or dizziness with walker ambulation. Mang has resumed eating solid foods and family reports that he is tolerating normal texture foods. Denies choking or coughing episodes with meals. Social History   Social History  . Marital status: Single    Spouse name: N/A  . Number of children: N/A  . Years of education: N/A   Occupational History  . Not on file.   Social History Main Topics  . Smoking status: Never Smoker  . Smokeless tobacco: Never Used  . Alcohol use No  . Drug use: No  . Sexual activity: Not on file   Other Topics Concern  . Not on file   Social History Narrative  . No narrative on file    Family History  Problem Relation Age of Onset  . Stroke Sister  Review of Systems  Constitutional: Positive for fatigue.  Eyes: Negative.   Respiratory: Negative.   Cardiovascular: Negative.   Musculoskeletal:       Left hand swelling   Allergic/Immunologic: Negative.   Neurological: Positive for weakness.  Hematological: Negative.    Psychiatric/Behavioral: Negative.     Patient Active Problem List   Diagnosis Date Noted  . Hypotension 09/27/2017  . Medication management 09/27/2017  . Cerebrovascular accident (CVA) (HCC)   . Ischemic leg   . Dysphagia, post-stroke   . Acute on chronic combined systolic and diastolic CHF (congestive heart failure) (HCC)   . Tachypnea   . PAF (paroxysmal atrial fibrillation) (HCC)   . Leukocytosis   . Acute blood loss anemia   . Hyponatremia   . Pressure injury of skin 08/27/2017  . Hypernatremia 08/24/2017  . Bradycardia 08/24/2017  . Right hemiparesis (HCC) 08/22/2017  . Volume depletion 08/22/2017  . Altered mental status 08/22/2017  . Aphasia 08/22/2017  . AKI (acute kidney injury) (HCC) 08/22/2017  . General weakness 08/22/2017  . Acute cerebrovascular accident (CVA) (HCC) 08/22/2017  . Acute CVA (cerebrovascular accident) (HCC) 08/22/2017    No Known Allergies  Prior to Admission medications   Medication Sig Start Date End Date Taking? Authorizing Provider  apixaban (ELIQUIS) 5 MG TABS tablet Take 1 tablet (5 mg total) by mouth 2 (two) times daily. 09/27/17  Yes Rollene Rotunda, MD  atorvastatin (LIPITOR) 20 MG tablet Take 1 tablet (20 mg total) by mouth daily at 6 PM. 09/27/17  Yes Hochrein, Fayrene Fearing, MD  docusate sodium (COLACE) 100 MG capsule Take 1 capsule (100 mg total) by mouth daily. 09/05/17  Yes Hongalgi, Maximino Greenland, MD  midodrine (PROAMATINE) 10 MG tablet Take 1 tablet (10 mg total) by mouth 3 (three) times daily with meals. 09/27/17  Yes Rollene Rotunda, MD  acetaminophen (TYLENOL) 325 MG tablet Take 2 tablets (650 mg total) by mouth every 6 (six) hours as needed for mild pain, moderate pain, fever or headache. Patient not taking: Reported on 10/10/2017 09/05/17   Elease Etienne, MD  Maltodextrin-Xanthan Gum (RESOURCE THICKENUP CLEAR) POWD Oral, as needed. Patient not taking: Reported on 10/10/2017 09/05/17   Elease Etienne, MD   Past Medical, Surgical  Family and Social History reviewed and updated.   Objective:   Today's Vitals   10/10/17 1307  BP: 122/84  Pulse: 90  Resp: 12  Temp: 98.9 F (37.2 C)  TempSrc: Oral  SpO2: 98%  Weight: 174 lb 9.6 oz (79.2 kg)  Height: 6\' 1"  (1.854 m)   Wt Readings from Last 3 Encounters:  10/10/17 174 lb 9.6 oz (79.2 kg)  09/27/17 174 lb (78.9 kg)  09/27/17 175 lb 3.2 oz (79.5 kg)   Physical Exam  Constitutional: He is oriented to person, place, and time. He appears well-developed and well-nourished.  HENT:  Head: Normocephalic and atraumatic.  Eyes: Pupils are equal, round, and reactive to light. Conjunctivae are normal.  Neck: Normal range of motion. Neck supple. No tracheal deviation present. No thyromegaly present.  Cardiovascular: A regularly irregular rhythm present.  Pulses:      Carotid pulses are 2+ on the right side, and 2+ on the left side.      Radial pulses are 2+ on the right side, and 2+ on the left side.  Pulmonary/Chest: Effort normal and breath sounds normal.  Abdominal: Soft. Bowel sounds are normal. He exhibits no distension.  Lymphadenopathy:    He has no cervical adenopathy.  Neurological:  He is alert and oriented to person, place, and time. He exhibits normal muscle tone. Coordination normal.  Abnormal coordination and tone on the right side only.  Skin: Skin is warm and dry.  Psychiatric: He has a normal mood and affect. His behavior is normal. Judgment and thought content normal.       Assessment & Plan:  1. 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 2. Swelling of right hand, will trial right arm sling to assist with right extremity repositioning. 3. Right hemiplegia (HCC), encouraged family to try a right arm sling and frequently reposition right arm and right leg to promote circulation, reduce muscle atrophy, and edema. 4. PAF (paroxysmal atrial fibrillation) (HCC), patient currently is not  on any type of anti-dysrhythmic hypotension as well as bradycardia.  Patient refuses pacemaker.  Meeting a request for home health as patient is home-bound, needs speech, and intensive physical therapy to prevent further deconditioning.  Will submit a order for home health services. Will send home health referral to [facility] in [place], [place].   RTC: 3 months. Repeat iron panel, BMP, CBC, hemoglobin A1C, Vitamin D level.    Godfrey Pick. Tiburcio Pea, MSN, FNP-C [facility]  [address] [place], [place] [address] [phone]

## 2017-10-11 ENCOUNTER — Encounter: Payer: Self-pay | Admitting: Physician Assistant

## 2017-10-11 ENCOUNTER — Telehealth: Payer: Self-pay | Admitting: Family Medicine

## 2017-10-11 ENCOUNTER — Ambulatory Visit (INDEPENDENT_AMBULATORY_CARE_PROVIDER_SITE_OTHER): Payer: Self-pay | Admitting: Physician Assistant

## 2017-10-11 VITALS — BP 112/64 | HR 67 | Ht 73.0 in | Wt 174.2 lb

## 2017-10-11 DIAGNOSIS — I959 Hypotension, unspecified: Secondary | ICD-10-CM

## 2017-10-11 DIAGNOSIS — I639 Cerebral infarction, unspecified: Secondary | ICD-10-CM

## 2017-10-11 DIAGNOSIS — I749 Embolism and thrombosis of unspecified artery: Secondary | ICD-10-CM

## 2017-10-11 DIAGNOSIS — I480 Paroxysmal atrial fibrillation: Secondary | ICD-10-CM

## 2017-10-11 DIAGNOSIS — R001 Bradycardia, unspecified: Secondary | ICD-10-CM

## 2017-10-11 MED ORDER — CARVEDILOL 3.125 MG PO TABS: 3.1250 mg | ORAL_TABLET | Freq: Two times a day (BID) | ORAL | 11 refills | Status: DC

## 2017-10-11 NOTE — Patient Instructions (Signed)
Medication Instructions:  Your physician has recommended you make the following change in your medication:  Start Coreg 3.125 mg Daily    Labwork: NONE   Testing/Procedures: NONE    Follow-Up: Your physician recommends that you schedule a follow-up appointment in: 2 weeks    Any Other Special Instructions Will Be Listed Below (If Applicable). Home Health Nurse to Check Blood Pressure and Pulse Daily     If you need a refill on your cardiac medications before your next appointment, please call your pharmacy.  Thank you for choosing [facility]!

## 2017-10-11 NOTE — Telephone Encounter (Signed)
Please fax notes and home health order to [facility] in [place]. Please follow-up tomorrow to ensure that request has been received and is in processing. This is an urgent request.   Godfrey PickKimberly S. Tiburcio PeaHarris, MSN, FNP-C [facility]  [address] [place], [place] [address] [phone]

## 2017-10-11 NOTE — Telephone Encounter (Signed)
Paperwork faxed to [facility]

## 2017-10-12 NOTE — Telephone Encounter (Signed)
Referral was received by [facility]

## 2017-10-23 NOTE — Progress Notes (Deleted)
Cardiology Office Note   Date:  10/23/2017   ID:  [name] [name], DOB 05/23/1951, MRN [id number]  PCP:  Bing NeighborsHarris, Kimberly S, FNP  Cardiologist: Rollene RotundaJames Hochrein, MD  No chief complaint on file.     History of Present Illness: [name] [name] is a 66 y.o. male who presents for going assessment and management of atrial fibrillation, with other history to include live frontoparietal CVA and left MCA distribution.  The patient also had a history of lower extremity emboli which required emergent embolectomy.  Was last seen in the office by Dr. Antoine PocheHochrein on 09/27/2017.  He was found to be back in atrial fibrillation, with bradycardic rate.  His blood pressure was soft.  The patient had refused a pacemaker in the past.  He was started on digoxin 0.125 mg daily with close follow-up planned for heart rate response to new medication.  A CBC was also ordered along with a BMET.  CBC dated 09/27/2017: Hemoglobin 15.7, hematocrit 46.8, white blood cells 8.9, platelets 275 BMET dated 09/27/2017: Sodium 140, potassium 4.7, chloride 99, glucose 92, BUN 14, creatinine 1.24.  Patient had follow-up by [name] BalloonMichelle Lenz, PA on 10/11/2017.  The patient continued on Midrin for hypotension.  [name] [name] added low-dose carvedilol 3.125 mg twice daily for heart rate control with home health nurses checking on blood pressure response.  Past Medical History:  Diagnosis Date  . Afib (HCC)   . Peripheral vascular disease (HCC)   . Stroke [facility](HCC)    Aphasia, right sided weakness     No past surgical history on file.   Current Outpatient Medications  Medication Sig Dispense Refill  . apixaban (ELIQUIS) 5 MG TABS tablet Take 1 tablet (5 mg total) by mouth 2 (two) times daily. 60 tablet 11  . atorvastatin (LIPITOR) 20 MG tablet Take 1 tablet (20 mg total) by mouth daily at 6 PM. 30 tablet 11  . carvedilol (COREG) 3.125 MG tablet Take 1 tablet (3.125 mg total) by mouth 2 (two) times daily with a meal. 60 tablet 11  .  docusate sodium (COLACE) 100 MG capsule Take 1 capsule (100 mg total) by mouth daily.    . midodrine (PROAMATINE) 10 MG tablet Take 1 tablet (10 mg total) by mouth 3 (three) times daily with meals. 90 tablet 11   No current facility-administered medications for this visit.     Allergies:   Patient has no known allergies.    Social History:  The patient  reports that  has never smoked. he has never used smokeless tobacco. He reports that he does not drink alcohol or use drugs.   Family History:  The patient's family history includes Stroke in his sister.    ROS: All other systems are reviewed and negative. Unless otherwise mentioned in H&P    PHYSICAL EXAM: VS:  There were no vitals taken for this visit. , BMI There is no height or weight on file to calculate BMI. GEN: Well nourished, well developed, in no acute distress  HEENT: normal  Neck: no JVD, carotid bruits, or masses Cardiac: ***RRR; no murmurs, rubs, or gallops,no edema  Respiratory:  clear to auscultation bilaterally, normal work of breathing GI: soft, nontender, nondistended, + BS MS: no deformity or atrophy  Skin: warm and dry, no rash Neuro:  Strength and sensation are intact Psych: euthymic mood, full affect   EKG:  EKG {ACTION; IS/IS [id number]} ordered today. The ekg ordered today demonstrates ***   Recent Labs: 08/23/2017: ALT 26 08/30/2017: TSH  1.627 09/03/2017: Magnesium 1.9 09/27/2017: BUN 14; Creatinine, Ser 1.24; Hemoglobin 15.7; Platelets 275; Potassium 4.7; Sodium 140    Lipid Panel    Component Value Date/Time   CHOL 149 08/23/2017 0425   TRIG 102 08/23/2017 0425   HDL 51 08/23/2017 0425   CHOLHDL 2.9 08/23/2017 0425   VLDL 20 08/23/2017 0425   LDLCALC 78 08/23/2017 0425      Wt Readings from Last 3 Encounters:  10/11/17 174 lb 3.2 oz (79 kg)  10/10/17 174 lb 9.6 oz (79.2 kg)  09/27/17 174 lb (78.9 kg)      Other studies Reviewed: Additional studies/ records that were reviewed today  include: ***. Review of the above records demonstrates: ***   ASSESSMENT AND PLAN:  1.  ***   Current medicines are reviewed at length with the patient today.    Labs/ tests ordered today include: *** Bettey Mare. Liborio Nixon, ANP, AACC   10/23/2017 4:12 PM    [facility] [address] [place], [place] [address] Phone: [phone]; Fax: [phone]

## 2017-10-24 ENCOUNTER — Encounter: Payer: Self-pay | Admitting: Adult Health

## 2017-10-24 ENCOUNTER — Ambulatory Visit: Payer: Medicare Other | Admitting: Adult Health

## 2017-10-25 ENCOUNTER — Encounter (HOSPITAL_COMMUNITY): Payer: Medicare Other | Admitting: Speech Pathology

## 2017-10-25 ENCOUNTER — Telehealth (HOSPITAL_COMMUNITY): Payer: Self-pay | Admitting: Speech Pathology

## 2017-10-25 ENCOUNTER — Encounter (HOSPITAL_COMMUNITY): Payer: Medicare Other | Admitting: Occupational Therapy

## 2017-10-25 ENCOUNTER — Ambulatory Visit (HOSPITAL_COMMUNITY): Payer: Medicare Other | Attending: Neurology | Admitting: Occupational Therapy

## 2017-10-25 ENCOUNTER — Ambulatory Visit (HOSPITAL_COMMUNITY): Payer: Medicare Other | Admitting: Speech Pathology

## 2017-10-25 ENCOUNTER — Ambulatory Visit (HOSPITAL_COMMUNITY): Payer: Medicare Other

## 2017-10-25 NOTE — Telephone Encounter (Signed)
Telephone Call:  Pt did not show for his scheduled SLP/OT/PT session starting at 1PM this date. SLP placed phone call to Pt, however voice mailbox was full.  Thank you,  Havery MorosDabney Elnora Quizon, CCC-SLP [phone]

## 2017-12-07 ENCOUNTER — Ambulatory Visit: Payer: Medicare Other | Admitting: Family

## 2017-12-07 ENCOUNTER — Other Ambulatory Visit (HOSPITAL_COMMUNITY): Payer: Medicare Other

## 2017-12-07 ENCOUNTER — Encounter (HOSPITAL_COMMUNITY): Payer: Medicare Other

## 2018-01-01 NOTE — Progress Notes (Deleted)
[facility]  PATIENT: [name] [name] DOB: 69/23/1952   REASON FOR VISIT: *** HISTORY FROM:    HISTORY OF PRESENT ILLNESS:  09/27/17 PSMr [name] is a [age] year Caucasian male seen today for first office follow-up visit following hospital admission for stroke in September 2018. He is a complaint by his sister and friend. History is obtained from them as well as review of electronic medical records. I have personally reviewed imaging films. [name] a 67 y.o.malewith no documented medical history presenting with right-sided weakness and expressive aphasia. He has no home meds and intially [facility] with subacute L MCA infarct, and was [facility] out of concern for cerebral edema. He is currently aphasic with right-sided weakness. Mr.Lintecumpresented to [facility] on [facility] by EMS after being found on the ground, flaccid to the right side andlaying in his ownurine at his home. A family member went to check on him after not hearing from him for two days and found him this way. His lives with his elderly mother who he has been caring after she had a stroke. ER evaluation revealed a large subacute Left MCA CVA with 5 mm midline shift and hew as noted to be bradycardic down into the 30s. 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 He does not see doctors, take any medications or have any known allergies. [facility] because of concern for brainswelling. LKW: 08/20/17.Patient was not administered IV t-PA secondary to arriving outside of the treatment window. Hewas admitted for stroke workup. MRI scan of the brain showed a large acute left MCA infarct mostly confluent along the insula, operculum and basal ganglia with petechial hemorrhage and cytotoxic edema with 5 mm midline shift. MRA of  the brain showed occlusion of the left middle cerebral artery in the M1 segment is at [place] atherosclerosis in the right P2 segment with stenosis. Carotid ultrasound showed no significant siphon stenosis. Transfer to get a normal ejection fraction. LDL cholesterol 70 mg percent. Women A1c was 5.2. Patient had significant persistent bradycardia for which cardiology was consulted but a pacemaker. He eventually was found to have atrial fibrillation for which she was started on eliquis. During hospitalization his course was complicated by embolism to lower extremities and he required Bilateral embolectomy iliac of femoral, popliteal, for compartment fasciotomy bilaterally on 08-26-17 by Dr. Darrick PennaFields for Bilateral lower extremity ischemia. Patient was transferred for rehabilitation to a nursing home but the patient signed himself out from there. Is presently living at home with his mother and sister. Still has significant expressive aphasia and can barely see even a few words. His complaints in spite of growth. Is up in some improvement in his right-sided strength he can walk with a walker but needs 1 person assist. Can move his right upper extremity proximally but has significant hand and grip weakness yet. Patient plans to see his primary physician Dr. Joaquin CourtsKimberly Harris soon as well as his cardiologist Dr. Quintin AltoHoch Ryan. He has been given coupon and samples of eliquis but feels he will not be able to afford it. He plans to start outpatient therapies soon.   REVIEW OF SYSTEMS: Full 14 system review of systems performed and notable only for those listed, all others are neg:  Constitutional: neg  Cardiovascular: neg Ear/Nose/Throat: neg  Skin: neg Eyes: neg  Respiratory: neg Gastroitestinal: neg  Hematology/Lymphatic: neg  Endocrine: neg Musculoskeletal:neg Allergy/Immunology: neg Neurological: neg Psychiatric: neg Sleep : neg   ALLERGIES: No Known Allergies  HOME MEDICATIONS: Outpatient Medications Prior to  Visit  Medication Sig Dispense Refill  . apixaban (ELIQUIS) 5 MG TABS tablet Take 1 tablet (5 mg total) by mouth 2 (two) times daily. 60 tablet 11  . atorvastatin (LIPITOR) 20 MG tablet Take 1 tablet (20 mg total) by mouth daily at 6 PM. 30 tablet 11  . carvedilol (COREG) 3.125 MG tablet Take 1 tablet (3.125 mg total) by mouth 2 (two) times daily with a meal. 60 tablet 11  . docusate sodium (COLACE) 100 MG capsule Take 1 capsule (100 mg total) by mouth daily.    . midodrine (PROAMATINE) 10 MG tablet Take 1 tablet (10 mg total) by mouth 3 (three) times daily with meals. 90 tablet 11   No facility-administered medications prior to visit.     PAST MEDICAL HISTORY: Past Medical History:  Diagnosis Date  . Afib (HCC)   . Peripheral vascular disease (HCC)   . Stroke [facility]    Aphasia, right sided weakness     PAST SURGICAL HISTORY: Past Surgical History:  Procedure Laterality Date  . EMBOLECTOMY Right 08/30/2017   Procedure: Right popliteal tibial endarectomy with patch angioplasty;  Surgeon: Sherren Kerns, MD;  Location: [facility] OR;  Service: Vascular;  Laterality: Right;  . FASCIOTOMY CLOSURE Bilateral 08/28/2017   Procedure: FASCIOTOMY CLOSURE/BILATERAL  lower legs;  Surgeon: Sherren Kerns, MD;  Location: [facility] OR;  Service: Vascular;  Laterality: Bilateral;  . FEMORAL-POPLITEAL BYPASS GRAFT Bilateral 08/26/2017   Procedure: Bilateral FEMORAL EMBOLECTOMY with  BILATERAL Four Compartment  FASCIOTOMIES.;  Surgeon: Sherren Kerns, MD;  Location[facility];  Service: Vascular;  Laterality: Bilateral;    FAMILY HISTORY: Family History  Problem Relation Age of Onset  . Stroke Sister     SOCIAL HISTORY: Social History   Socioeconomic History  . Marital status: Single    Spouse name: Not on file  . Number of children: Not on file  . Years of education: Not on file  . Highest education level: Not on file  Social Needs  . Financial resource strain: Not on file  . Food insecurity - worry:  Not on file  . Food insecurity - inability: Not on file  . Transportation needs - medical: Not on file  . Transportation needs - non-medical: Not on file  Occupational History  . Not on file  Tobacco Use  . Smoking status: Never Smoker  . Smokeless tobacco: Never Used  Substance and Sexual Activity  . Alcohol use: No  . Drug use: No  . Sexual activity: Not on file  Other Topics Concern  . Not on file  Social History Narrative  . Not on file     PHYSICAL EXAM  There were no vitals filed for this visit. There is no height or weight on file to calculate BMI.  Generalized: Well developed, in no acute distress  Head: normocephalic and atraumatic,. Oropharynx benign  Neck: Supple, no carotid bruits  Cardiac: Regular rate rhythm, no murmur  Musculoskeletal: No deformity   Neurological examination   Mentation: Alert oriented to time, place, history taking. Attention span and concentration appropriate. Recent and remote memory intact.  Follows all commands speech and language fluent.   Cranial nerve II-XII: Fundoscopic exam reveals sharp disc margins.Pupils were equal round reactive to light extraocular movements were full, visual field were full  on confrontational test. Facial sensation and strength were normal. hearing was intact to finger rubbing bilaterally. Uvula tongue midline. head turning and shoulder shrug were normal and symmetric.Tongue protrusion into cheek strength was normal. Motor: normal bulk and tone, full strength in the BUE, BLE, fine finger movements normal, no pronator drift. No focal weakness Sensory: normal and symmetric to light touch, pinprick, and  Vibration, proprioception  Coordination: finger-nose-finger, heel-to-shin bilaterally, no dysmetria Reflexes: Brachioradialis 2/2, biceps 2/2, triceps 2/2, patellar 2/2, Achilles 2/2, plantar responses were flexor bilaterally. Gait and Station: Rising up from seated position without assistance, normal stance,   moderate stride, good arm swing, smooth turning, able to perform tiptoe, and heel walking without difficulty. Tandem gait is steady  DIAGNOSTIC DATA (LABS, IMAGING, TESTING) - I reviewed patient records, labs, notes, testing and imaging myself where available.  Lab Results  Component Value Date   WBC 8.9 09/27/2017   HGB 15.7 09/27/2017   HCT 46.8 09/27/2017   MCV 94 09/27/2017   PLT 275 09/27/2017      Component Value Date/Time   NA 140 09/27/2017 0930   K 4.7 09/27/2017 0930   CL 99 09/27/2017 0930   CO2 23 09/27/2017 0930   GLUCOSE 92 09/27/2017 0930   GLUCOSE 91 09/04/2017 0355   BUN 14 09/27/2017 0930   CREATININE 1.24 09/27/2017 0930   CALCIUM 10.1 09/27/2017 0930   PROT 6.4 (L) 08/23/2017 1007   ALBUMIN 3.1 (L) 08/23/2017 1007   AST 33 08/23/2017 1007   ALT 26 08/23/2017 1007   ALKPHOS 44 08/23/2017 1007   BILITOT 1.3 (H) 08/23/2017 1007   GFRNONAA 59 (L) 09/27/2017 0930   GFRAA 69 09/27/2017 0930   Lab Results  Component Value Date   CHOL 149 08/23/2017   HDL 51 08/23/2017   LDLCALC 78 08/23/2017   TRIG 102 08/23/2017   CHOLHDL 2.9 08/23/2017   Lab Results  Component Value Date   HGBA1C 5.2 08/22/2017   Lab Results  Component Value Date   VITAMINB12 644 09/01/2017   Lab Results  Component Value Date   TSH 1.627 08/30/2017      ASSESSMENT AND PLAN   67 year old Caucasian male with embolic left MCA branch infarct secondary to atrial fibrillation and September 2018 with significant residual expressive aphasia and right hemiplegia.   PLAN: I had a long d/w patient, his sister and friend about his recent embolic stroke,atrial fibrillation risk for recurrent stroke/TIAs, personally independently reviewed imaging studies and stroke evaluation results and answered questions.Continue Eliquis (apixaban) daily  for secondary stroke prevention and maintain strict control of hypertension with blood pressure goal below 130/90, diabetes with hemoglobin A1c  goal below 6.5% and lipids with LDL cholesterol goal below 70 mg/dL. I also advised the patient to eat a healthy diet with plenty of whole grains, cereals, fruits and vegetables, exercise regularly and maintain ideal body weight .I advised patient to use a walker at all times for fall and safety cautions. The patient is having a figure time affording eliquis he may be better off switching to warfarin for long-term anticoagulation. I advised him to discuss this with his primary physician or cardiologist. Followup in the future with my nurse    Nilda Riggs, [facility], [facility], APRN  [facility] [address] [place], [place] [address] [phone]

## 2018-01-02 ENCOUNTER — Telehealth: Payer: Self-pay | Admitting: *Deleted

## 2018-01-02 ENCOUNTER — Ambulatory Visit: Payer: Medicare Other | Admitting: Nurse Practitioner

## 2018-01-02 NOTE — Telephone Encounter (Signed)
Patient was no show for FU today with NP. 

## 2018-01-03 ENCOUNTER — Encounter: Payer: Self-pay | Admitting: Nurse Practitioner

## 2018-01-10 ENCOUNTER — Ambulatory Visit: Payer: Medicare Other | Admitting: Family Medicine

## 2018-06-20 ENCOUNTER — Encounter (HOSPITAL_COMMUNITY): Payer: Self-pay | Admitting: Emergency Medicine

## 2018-06-20 ENCOUNTER — Emergency Department (HOSPITAL_COMMUNITY)
Admission: EM | Admit: 2018-06-20 | Discharge: 2018-06-20 | Disposition: A | Discharge status: Home / Self Care | Payer: Medicare Other | Attending: Emergency Medicine | Admitting: Emergency Medicine

## 2018-06-20 ENCOUNTER — Other Ambulatory Visit: Payer: Self-pay

## 2018-06-20 ENCOUNTER — Emergency Department (HOSPITAL_COMMUNITY): Payer: Medicare Other

## 2018-06-20 DIAGNOSIS — I480 Paroxysmal atrial fibrillation: Secondary | ICD-10-CM | POA: Diagnosis not present

## 2018-06-20 DIAGNOSIS — Z79899 Other long term (current) drug therapy: Secondary | ICD-10-CM | POA: Diagnosis not present

## 2018-06-20 DIAGNOSIS — I5042 Chronic combined systolic (congestive) and diastolic (congestive) heart failure: Secondary | ICD-10-CM | POA: Insufficient documentation

## 2018-06-20 DIAGNOSIS — I509 Heart failure, unspecified: Secondary | ICD-10-CM

## 2018-06-20 DIAGNOSIS — Z7901 Long term (current) use of anticoagulants: Secondary | ICD-10-CM | POA: Diagnosis not present

## 2018-06-20 DIAGNOSIS — I739 Peripheral vascular disease, unspecified: Secondary | ICD-10-CM | POA: Diagnosis not present

## 2018-06-20 DIAGNOSIS — Z8673 Personal history of transient ischemic attack (TIA), and cerebral infarction without residual deficits: Secondary | ICD-10-CM | POA: Insufficient documentation

## 2018-06-20 DIAGNOSIS — Z9189 Other specified personal risk factors, not elsewhere classified: Secondary | ICD-10-CM

## 2018-06-20 DIAGNOSIS — I4891 Unspecified atrial fibrillation: Secondary | ICD-10-CM | POA: Diagnosis not present

## 2018-06-20 DIAGNOSIS — R0602 Shortness of breath: Secondary | ICD-10-CM | POA: Diagnosis not present

## 2018-06-20 DIAGNOSIS — R200 Anesthesia of skin: Secondary | ICD-10-CM | POA: Diagnosis present

## 2018-06-20 DIAGNOSIS — I639 Cerebral infarction, unspecified: Secondary | ICD-10-CM | POA: Diagnosis not present

## 2018-06-20 LAB — CBC WITH DIFFERENTIAL/PLATELET
BASOS ABS: 0.1 10*3/uL (ref 0.0–0.1)
BASOS PCT: 1 %
Eosinophils Absolute: 0.2 10*3/uL (ref 0.0–0.7)
Eosinophils Relative: 2 %
HEMATOCRIT: 43.4 % (ref 39.0–52.0)
HEMOGLOBIN: 14.4 g/dL (ref 13.0–17.0)
Lymphocytes Relative: 16 %
Lymphs Abs: 1.4 10*3/uL (ref 0.7–4.0)
MCH: 30.4 pg (ref 26.0–34.0)
MCHC: 33.2 g/dL (ref 30.0–36.0)
MCV: 91.8 fL (ref 78.0–100.0)
Monocytes Absolute: 0.7 10*3/uL (ref 0.1–1.0)
Monocytes Relative: 7 %
NEUTROS ABS: 6.9 10*3/uL (ref 1.7–7.7)
NEUTROS PCT: 74 %
Platelets: 267 10*3/uL (ref 150–400)
RBC: 4.73 MIL/uL (ref 4.22–5.81)
RDW: 13.4 % (ref 11.5–15.5)
WBC: 9.3 10*3/uL (ref 4.0–10.5)

## 2018-06-20 LAB — COMPREHENSIVE METABOLIC PANEL
ALBUMIN: 3.6 g/dL (ref 3.5–5.0)
ALK PHOS: 68 U/L (ref 38–126)
ALT: 24 U/L (ref 0–44)
AST: 19 U/L (ref 15–41)
Anion gap: 8 (ref 5–15)
BILIRUBIN TOTAL: 0.6 mg/dL (ref 0.3–1.2)
BUN: 20 mg/dL (ref 8–23)
CO2: 25 mmol/L (ref 22–32)
Calcium: 8.8 mg/dL — ABNORMAL LOW (ref 8.9–10.3)
Chloride: 107 mmol/L (ref 98–111)
Creatinine, Ser: 1.44 mg/dL — ABNORMAL HIGH (ref 0.61–1.24)
GFR calc Af Amer: 56 mL/min — ABNORMAL LOW (ref >60)
GFR calc non Af Amer: 48 mL/min — ABNORMAL LOW (ref >60)
GLUCOSE: 115 mg/dL — AB (ref 70–99)
POTASSIUM: 4.2 mmol/L (ref 3.5–5.1)
Sodium: 140 mmol/L (ref 135–145)
TOTAL PROTEIN: 7.4 g/dL (ref 6.5–8.1)

## 2018-06-20 LAB — BRAIN NATRIURETIC PEPTIDE: B Natriuretic Peptide: 164 pg/mL — ABNORMAL HIGH (ref 0.0–100.0)

## 2018-06-20 LAB — TROPONIN I: Troponin I: <0.03 ng/mL (ref <0.03)

## 2018-06-20 MED ORDER — CARVEDILOL 3.125 MG PO TABS: 3.1250 mg | ORAL_TABLET | Freq: Two times a day (BID) | ORAL | 11 refills | Status: DC

## 2018-06-20 MED ORDER — ATORVASTATIN CALCIUM 20 MG PO TABS: 20.0000 mg | ORAL_TABLET | Freq: Every day | ORAL | 11 refills | Status: DC

## 2018-06-20 MED ORDER — MIDODRINE HCL 10 MG PO TABS: 10.0000 mg | ORAL_TABLET | Freq: Three times a day (TID) | ORAL | 11 refills | Status: DC

## 2018-06-20 MED ORDER — CARVEDILOL 3.125 MG PO TABS
3.1250 mg | ORAL_TABLET | Freq: Once | ORAL | Status: AC
  Administered 2018-06-20: 3.125 mg via ORAL
  Filled 2018-06-20 (×2): qty 1

## 2018-06-20 MED ORDER — WARFARIN SODIUM 5 MG PO TABS: 5.0000 mg | ORAL_TABLET | Freq: Every day | ORAL | 0 refills | Status: DC

## 2018-06-20 MED ORDER — WARFARIN SODIUM 5 MG PO TABS
5.0000 mg | ORAL_TABLET | Freq: Once | ORAL | Status: AC
  Administered 2018-06-20: 5 mg via ORAL
  Filled 2018-06-20: qty 1

## 2018-06-20 MED ORDER — APIXABAN 5 MG PO TABS
5.0000 mg | ORAL_TABLET | Freq: Once | ORAL | Status: AC
  Administered 2018-06-20: 5 mg via ORAL
  Filled 2018-06-20 (×2): qty 1

## 2018-06-20 MED ORDER — CARVEDILOL 3.125 MG PO TABS
3.1250 mg | ORAL_TABLET | Freq: Two times a day (BID) | ORAL | Status: DC
  Filled 2018-06-20 (×4): qty 1

## 2018-06-20 NOTE — ED Provider Notes (Signed)
Emergency Department Provider Note   I have reviewed the triage vital signs and the nursing notes.   HISTORY  Chief Complaint Numbness   HPI [name] [name] is a 67 y.o. male with significant past medical history to include a stroke with residual right-sided weakness also with bilateral lower extremity claudication and peripheral arterial disease status post embolectomy along with atrial fibrillation has been off his medication for a couple months the presents to the emergency department today secondary to claudication symptoms.  Patient states Eliquis was too expensive so he stopped it but he also stopped all of his other medications for unknown reasons.  He has not followed up with any of his physicians in the last 6 months the best I can tell.  He is here today because he has shortness of breath when he exerts himself which progressively has been worsening.  He also has worsening paresthesias and weakness in his legs after walking approximately 30 to 40 yards that resolves upon rest.  No focal deficits otherwise.  At rest he is asymptomatic.  No cough or fever. No other associated or modifying symptoms.    Past Medical History:  Diagnosis Date  . Afib (HCC)   . Peripheral vascular disease (HCC)   . Stroke [facility])    Aphasia, right sided weakness     Patient Active Problem List   Diagnosis Date Noted  . Hypotension 09/27/2017  . Medication management 09/27/2017  . Cerebrovascular accident (CVA) (HCC)   . Ischemic leg   . Dysphagia, post-stroke   . Acute on chronic combined systolic and diastolic CHF (congestive heart failure) (HCC)   . Tachypnea   . PAF (paroxysmal atrial fibrillation) (HCC)   . Leukocytosis   . Acute blood loss anemia   . Hyponatremia   . Pressure injury of skin 08/27/2017  . Hypernatremia 08/24/2017  . Bradycardia 08/24/2017  . Right hemiparesis (HCC) 08/22/2017  . Volume depletion 08/22/2017  . Altered mental status 08/22/2017  . Aphasia  08/22/2017  . AKI (acute kidney injury) (HCC) 08/22/2017  . General weakness 08/22/2017  . Acute cerebrovascular accident (CVA) (HCC) 08/22/2017  . Acute CVA (cerebrovascular accident) (HCC) 08/22/2017    Past Surgical History:  Procedure Laterality Date  . EMBOLECTOMY Right 08/30/2017   Procedure: Right popliteal tibial endarectomy with patch angioplasty;  Surgeon: Sherren Kerns, MD;  Location: [facility] OR;  Service: Vascular;  Laterality: Right;  . FASCIOTOMY CLOSURE Bilateral 08/28/2017   Procedure: FASCIOTOMY CLOSURE/BILATERAL  lower legs;  Surgeon: Sherren Kerns, MD;  Location: [facility] OR;  Service: Vascular;  Laterality: Bilateral;  . FEMORAL-POPLITEAL BYPASS GRAFT Bilateral 08/26/2017   Procedure: Bilateral FEMORAL EMBOLECTOMY with  BILATERAL Four Compartment  FASCIOTOMIES.;  Surgeon: Sherren Kerns, MD;  Location: [facility] OR;  Service: Vascular;  Laterality: Bilateral;    Current Outpatient Rx  . Order #: [id number] Class: Print  . Order #: [id number] Class: Print  . Order #: [id number] Class: Print  . Order #: [id number] Class: Print    Allergies Patient has no known allergies.  Family History  Problem Relation Age of Onset  . Stroke Sister     Social History Social History   Tobacco Use  . Smoking status: Never Smoker  . Smokeless tobacco: Never Used  Substance Use Topics  . Alcohol use: No  . Drug use: No    Review of Systems  All other systems negative except as documented in the HPI. All pertinent positives and negatives as reviewed in the HPI. ____________________________________________  PHYSICAL EXAM:  VITAL SIGNS: ED Triage Vitals  Enc Vitals Group     BP 06/20/18 1704 118/87     Pulse Rate 06/20/18 1704 76     Resp 06/20/18 1704 18     Temp 06/20/18 1704 98.7 F (37.1 C)     Temp Source 06/20/18 1704 Temporal     SpO2 06/20/18 1704 100 %     Weight 06/20/18 1705 170 lb (77.1 kg)     Height 06/20/18 1705 6\' 3"  (1.905 m)    Constitutional: Alert and  oriented. Well appearing and in no acute distress. Eyes: Conjunctivae are normal. PERRL. EOMI. Head: Atraumatic. Nose: No congestion/rhinnorhea. Mouth/Throat: Mucous membranes are dry.  Oropharynx non-erythematous. Neck: No stridor.  No meningeal signs.   Cardiovascular: tacjyucardoc rate, irregular rhythm. Good peripheral circulation via cap refill, not able to palpate DP pulses. Grossly normal heart sounds.   Respiratory: Normal respiratory effort.  No retractions. Lungs CTAB. Gastrointestinal: Soft and nontender. No distention.  Musculoskeletal: No lower extremity tenderness nor edema. No gross deformities of extremities. Neurologic:  Normal speech and language. No gross focal neurologic deficits are appreciated.  Skin:  Skin is warm, dry and intact. No rash noted.  ____________________________________________   LABS (all labs ordered are listed, but only abnormal results are displayed)  Labs Reviewed  COMPREHENSIVE METABOLIC PANEL - Abnormal; Notable for the following components:      Result Value   Glucose, Bld 115 (*)    Creatinine, Ser 1.44 (*)    Calcium 8.8 (*)    GFR calc non Af Amer 48 (*)    GFR calc Af Amer 56 (*)    All other components within normal limits  BRAIN NATRIURETIC PEPTIDE - Abnormal; Notable for the following components:   B Natriuretic Peptide 164.0 (*)    All other components within normal limits  CBC WITH DIFFERENTIAL/PLATELET  TROPONIN I   ____________________________________________  EKG   EKG Interpretation  Date/Time:  Wednesday June 20 2018 17:13:27 EDT Ventricular Rate:  156 PR Interval:    QRS Duration: 100 QT Interval:  317 QTC Calculation: 511 R Axis:   15 Text Interpretation:  Atrial fibrillation with rapid V-rate Abnormal R-wave progression, early transition Repolarization abnormality, prob rate related afib rvr different than sinus bradycardia previously. Confirmed by Marily Memos [phone]) on 06/20/2018 6:08:59 PM        ____________________________________________  RADIOLOGY  Dg Chest 2 View  Result Date: 06/20/2018 CLINICAL DATA:  Shortness of breath EXAM: CHEST - 2 VIEW COMPARISON:  None. FINDINGS: The heart size and mediastinal contours are within normal limits. Both lungs are clear. The visualized skeletal structures are unremarkable. IMPRESSION: No active cardiopulmonary disease. Electronically Signed   By: Deatra Robinson M.D.   On: 06/20/2018 18:55    ____________________________________________   PROCEDURES  Procedure(s) performed:   Procedures   ____________________________________________   INITIAL IMPRESSION / ASSESSMENT AND PLAN / ED COURSE  Patient in A. fib with RVR with movement with heart rate size 150.  At rest it seems to be in the 1 20-1 30 range.  We will try to give his carvedilol for this as that is what he was prescribed in the past.  CHA2DS2/VAS Stroke Risk Points  Current as of 4 minutes ago     4 >= 2 Points: High Risk  1 - 1.99 Points: Medium Risk  0 Points: Low Risk    The patient's score has not changed in the past year.:  No Change  Details    This score determines the patient's risk of having a stroke if the  patient has atrial fibrillation.       Points Metrics  1 Has Congestive Heart Failure:  Yes    Current as of 4 minutes ago  0 Has Vascular Disease:  No    Current as of 4 minutes ago  0 Has Hypertension:  No    Current as of 4 minutes ago  1 Age:  [age]    Current as of 4 minutes ago  0 Has Diabetes:  No    Current as of 4 minutes ago  2 Had Stroke:  Yes  Had TIA:  No  Had thromboembolism:  No    Current as of 4 minutes ago  0 Male:  No    Current as of 4 minutes ago    Suspect his bilateral lower extremity paresthesias are more from claudication than from a neurologic event.  I think his shortness of breath could be related to coronary artery disease versus CHF from being in A. fib.  We will get a chest x-ray and some labs to evaluate  this further.   Relatively unremarkable that after discussion with cardiology they discussed that the patient has many high risk factors for initiating medications as an outpatient and they recommended inpatient initiation of his carvedilol as he had problems with low heart rate and low blood pressure in the past.  He also recommended inpatient starting the Coumadin so he could be bridged if needed be.  Also needed social work come to follow-up.  After discussion with the patient he felt he needed to go home to care of his mother and refused to stay.  I tried to get an his friend to convince him as the friends that he could help take care of his mother but the patient absolutely refuses to there is no way he was staying in hospital for this.  He was of his right mind and competent to make his own decisions.  We will start him on his Coumadin, Coreg, Lipitor at home.  A dose of Eliquis given here to help hold him a little bit till his Coumadin starts to work.  Amatory referrals for vascular surgery, cardiology.  He needs to follow-up with his PCP.  Care management social work consulted as well.  Patient knows he can come back if he changes his mind about being admitted.  AMA     Pertinent labs & imaging results that were available during my care of the patient were reviewed by me and considered in my medical decision making (see chart for details).  ____________________________________________  FINAL CLINICAL IMPRESSION(S) / ED DIAGNOSES  Final diagnoses:  Atrial fibrillation with rapid ventricular response (HCC)  Claudication (HCC)  Heart failure, unspecified HF chronicity, unspecified heart failure type (HCC)  At high risk for stroke     MEDICATIONS GIVEN DURING THIS VISIT:  Medications  carvedilol (COREG) tablet 3.125 mg (3.125 mg Oral Given 06/20/18 2009)  warfarin (COUMADIN) tablet 5 mg (5 mg Oral Given 06/20/18 2029)  apixaban (ELIQUIS) tablet 5 mg (5 mg Oral Given 06/20/18 2029)      NEW OUTPATIENT MEDICATIONS STARTED DURING THIS VISIT:  Discharge Medication List as of 06/20/2018  8:10 PM    START taking these medications   Details  warfarin (COUMADIN) 5 MG tablet Take 1 tablet (5 mg total) by mouth daily., Starting Wed 06/20/2018, Print        Note:  This  note was prepared with assistance of Conservation officer, historic buildings. Occasional wrong-word or sound-a-like substitutions may have occurred due to the inherent limitations of voice recognition software.   Marily Memos, MD 06/20/18 [phone]

## 2018-06-20 NOTE — Discharge Instructions (Signed)
I have asked social work and care management to help make sure you get your medications and appointments.  If you change your mind about being admitted to the hospital, it is much safer to start his medications while here than at home.

## 2018-06-20 NOTE — ED Notes (Signed)
In room with Dr Clayborne DanaMesner while he explained to pt potential dangers of him leaving hospital, pt states he understands all the risk and agrees he still wants to leave.

## 2018-06-20 NOTE — ED Triage Notes (Signed)
Pt states he has had numbness in BLE x3 weeks. Had hx of stroke with R sided residual numbness and speech difficulty. Pt has not been on a blood thinner in several months or any of his medication.

## 2018-06-20 NOTE — Progress Notes (Signed)
D/w Dr. Clayborne DanaMesner about [name], patient of Dr. Antoine PocheHochrein. Has been medication non-compliant d/t cost issues - off of Eliquis, digoxin and coreg. He is in the [facility] with a-fib and RVR. 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 Would recommend admission for rate control and gentle medication titration. I agree that warfarin may be a cheaper alternative - could have IV heparin or lovenox bridge to warfarin but will have to arrange follow-up in warfarin clinic. Case management would be helpful to involve regarding financial issues.  Chrystie NoseKenneth C. Hilty, MD, [facility]ACC, FACP  [place]  [facility]  Medical Director of the Advanced Lipid Disorders &  Cardiovascular Risk Reduction Clinic Diplomate of the American Board of Clinical Lipidology Attending Cardiologist  Direct Dial: [phone]  Fax: [phone]  Website:  [email or web address]

## 2018-06-21 NOTE — Care Management (Signed)
CM consult received d/t pt leaving ED AMA and needing to follow up with cardiology and other providers. CM attempted to make contact with pt. Number [phone] is not a working number (CM will remove from chart). Number [phone] has a VM of a woman, no VM left as this is clearly not the pt's phone, may be sisters number, CM will try to call back later in the day.

## 2018-07-17 NOTE — Progress Notes (Signed)
Cardiology Office Note    Date:  07/18/2018   ID:  [name] [name], [name] [name] Jan 19, 1951, MRN [id number]  PCP:  [name] Neighbors, FNP  Cardiologist: Rollene Rotunda, MD    Chief Complaint  Patient presents with  . Follow-up    recent Emergency Dept visit    History of Present Illness:    [name] [name] is a 67 y.o. male with past medical history of paroxysmal atrial fibrillation, PVD (s/p bilateral fasciotomy and embolectomy in 08/2017), and prior CVA (residual expressive aphasia) who presents to the office today for follow-up from a recent Emergency Department visit.  He was last examined by Jacolyn Reedy, PA-C in 09/2017 following a recent visit with Dr. [name] during which he was found to be bradycardic and Amiodarone had been discontinued. He refused PPM placement and was placed on Digoxin with close follow-up arranged. Heart rate was elevated into the 110's at the time of his visit, therefore he was continued on low-dose Digoxin and Carvedilol 3.125 mg twice daily was added. Was also continued on Eliquis 5mg  BID for anticoagulation. He was informed to follow-up in 2 weeks for a heart rate and BP check but it has not been evaluated by Cardiology since.  He most recently presented to [facility] ED on 06/20/2018 for numbness along his lower extremities for the past 3 weeks with associated fatigue. He reported having been off of all of his medications for the past several months. Was noted to be in atrial fibrillation with RVR, heart rate in the 120's to 150's while in the ED. Initial troponin was negative but BNP was elevated to 164. CXR showed no active cardiopulmonary disease. It was recommended that he be admitted for rate-control and titration of his medication regimen and the initiation of anticoagulation, but he declined and left AMA. He was therefore started on Coumadin 5 mg daily along with Coreg 3.125 mg twice daily and informed to follow-up with Cardiology and Vascular Surgery in  the outpatient setting.  In talking with the patient today, he reports overall doing well since his recent emergency department visit. He reports breathing has been at baseline and denies any recurrent palpitations. Has not followed his heart rate at home but it is in the high 40's to mid-50's during today's visit. He reports compliance with his medications including Atorvastatin 20 mg daily, Carvedilol 3.125 mg twice daily, and Coumadin 5 mg daily. He is no longer on Midodrine.  No recent chest pain, orthopnea, PND, lower extremity edema, or dyspnea on exertion. He denies any recent melena, hematochezia, or hematuria. Does experience pain along his lower extremities which occurs at rest or with activity. Reports this has been chronic for several months.   Past Medical History:  Diagnosis Date  . Afib (HCC)    a. Paroxysmal --> previous issues with bradycardia while on Amiodarone and he refused PPM placement. Rate-control strategy pursued.   . Peripheral vascular disease (HCC)   . Stroke [facility])    Aphasia, right sided weakness     Past Surgical History:  Procedure Laterality Date  . EMBOLECTOMY Right 08/30/2017   Procedure: Right popliteal tibial endarectomy with patch angioplasty;  Surgeon: Sherren Kerns, MD;  Location: [facility] OR;  Service: Vascular;  Laterality: Right;  . FASCIOTOMY CLOSURE Bilateral 08/28/2017   Procedure: FASCIOTOMY CLOSURE/BILATERAL  lower legs;  Surgeon: Sherren Kerns, MD;  Location: [facility] OR;  Service: Vascular;  Laterality: Bilateral;  . FEMORAL-POPLITEAL BYPASS GRAFT Bilateral 08/26/2017   Procedure: Bilateral FEMORAL  EMBOLECTOMY with  BILATERAL Four Compartment  FASCIOTOMIES.;  Surgeon: Sherren Kerns, MD;  Location: [facility] OR;  Service: Vascular;  Laterality: Bilateral;    Current Medications: Outpatient Medications Prior to Visit  Medication Sig Dispense Refill  . atorvastatin (LIPITOR) 20 MG tablet Take 1 tablet (20 mg total) by mouth daily at 6 PM. 30 tablet 11    . carvedilol (COREG) 3.125 MG tablet Take 1 tablet (3.125 mg total) by mouth 2 (two) times daily with a meal. 60 tablet 11  . midodrine (PROAMATINE) 10 MG tablet Take 1 tablet (10 mg total) by mouth 3 (three) times daily with meals. 90 tablet 11  . warfarin (COUMADIN) 5 MG tablet Take 1 tablet (5 mg total) by mouth daily. 30 tablet 0   No facility-administered medications prior to visit.      Allergies:   Patient has no known allergies.   Social History   Socioeconomic History  . Marital status: Single    Spouse name: Not on file  . Number of children: Not on file  . Years of education: Not on file  . Highest education level: Not on file  Occupational History  . Not on file  Social Needs  . Financial resource strain: Not on file  . Food insecurity:    Worry: Not on file    Inability: Not on file  . Transportation needs:    Medical: Not on file    Non-medical: Not on file  Tobacco Use  . Smoking status: Never Smoker  . Smokeless tobacco: Never Used  Substance and Sexual Activity  . Alcohol use: No  . Drug use: No  . Sexual activity: Not on file  Lifestyle  . Physical activity:    Days per week: Not on file    Minutes per session: Not on file  . Stress: Not on file  Relationships  . Social connections:    Talks on phone: Not on file    Gets together: Not on file    Attends religious service: Not on file    Active member of club or organization: Not on file    Attends meetings of clubs or organizations: Not on file    Relationship status: Not on file  Other Topics Concern  . Not on file  Social History Narrative  . Not on file     Family History:  The patient's family history includes Stroke in his sister.   Review of Systems:   Please see the history of present illness.     General:  No chills, fever, night sweats or weight changes.  Cardiovascular:  No chest pain, dyspnea on exertion, edema, orthopnea, palpitations, paroxysmal nocturnal dyspnea. Positive  for palpitations (now resolved) and bilateral leg pain.  Dermatological: No rash, lesions/masses Respiratory: No cough, dyspnea Urologic: No hematuria, dysuria Abdominal:   No nausea, vomiting, diarrhea, bright red blood per rectum, melena, or hematemesis Neurologic:  No visual changes, wkns, changes in mental status. All other systems reviewed and are otherwise negative except as noted above.   Physical Exam:    VS:  BP 138/82   Pulse (!) 54   Ht 6\' 1"  (1.854 m)   Wt 183 lb 6.4 oz (83.2 kg)   SpO2 98%   BMI 24.20 kg/m    General: Well developed, well nourished Caucasian male appearing in no acute distress. Head: Normocephalic, atraumatic, sclera non-icteric, no xanthomas, nares are without discharge.  Neck: No carotid bruits. JVD not elevated.  Lungs: Respirations regular and  unlabored, without wheezes or rales.  Heart: Regular rhythm, bradycardiac rate. No S3 or S4.  No murmur, no rubs, or gallops appreciated. Abdomen: Soft, non-tender, non-distended with normoactive bowel sounds. No hepatomegaly. No rebound/guarding. No obvious abdominal masses. Msk:  Strength and tone appear normal for age. No joint deformities or effusions. Extremities: No clubbing or cyanosis. No lower extremity edema.  Distal pedal pulses are 2+ bilaterally. Neuro: Alert and oriented X 3. Residual weakness along right side. Expressive aphasia.  Psych:  Responds to questions appropriately with a normal affect. Skin: No rashes or lesions noted  Wt Readings from Last 3 Encounters:  07/18/18 183 lb 6.4 oz (83.2 kg)  06/20/18 170 lb (77.1 kg)  10/11/17 174 lb 3.2 oz (79 kg)     Studies/Labs Reviewed:   EKG:  EKG is ordered today. The ekg ordered today demonstrates sinus bradycardia, HR 48, with no acute ST changes when compared to prior tracings.   Recent Labs: 08/30/2017: TSH 1.627 09/03/2017: Magnesium 1.9 06/20/2018: ALT 24; B Natriuretic Peptide 164.0; BUN 20; Creatinine, Ser 1.44; Hemoglobin 14.4;  Platelets 267; Potassium 4.2; Sodium 140   Lipid Panel    Component Value Date/Time   CHOL 149 08/23/2017 0425   TRIG 102 08/23/2017 0425   HDL 51 08/23/2017 0425   CHOLHDL 2.9 08/23/2017 0425   VLDL 20 08/23/2017 0425   LDLCALC 78 08/23/2017 0425    Additional studies/ records that were reviewed today include:   Echocardiogram: 08/2017 Study Conclusions  - Left ventricle: The cavity size was normal. Wall thickness was   normal. Systolic function was normal. The estimated ejection   fraction was 55%. Wall motion was normal; there were no regional   wall motion abnormalities. Left ventricular diastolic function   parameters were normal. - Aortic valve: Trileaflet; mildly thickened leaflets. There was   trivial regurgitation. - Aorta: Mild aortic root dilatation. Aortic root dimension: 39 mm   (ED). - Mitral valve: There was mild regurgitation. - Left atrium: The atrium was mildly dilated. - Atrial septum: No defect or patent foramen ovale was identified. - Pulmonic valve: There was mild regurgitation.  Assessment:    1. PAF (paroxysmal atrial fibrillation) (HCC)   2. Current use of long term anticoagulation   3. PVD (peripheral vascular disease) (HCC)   4. History of CVA (cerebrovascular accident)   5. Hyperlipidemia LDL goal <70   6. Medication management      Plan:   In order of problems listed above:  1. Paroxysmal Atrial Fibrillation/ Current Use of Long-Term Anticoagulation - The patient has a known history of paroxysmal atrial fibrillation which has been difficult to manage in the setting of tachy-brady syndrome and medication noncompliance. He has previously refused PPM placement and is not interested in advanced therapies. - Recently evaluated in the ED and found to be in atrial fibrillation with RVR after having been without his medications for several months. He refused admission and was started on Coumadin 5 mg daily (unable to afford Eliquis) along with  Coreg 3.125 mg twice daily for rate control. - He reports overall doing well since and denies any recurrent palpitations or dyspnea. Heart rate is lower in the high 40's to mid 50's during today's visit and he is overall asymptomatic with this. He is back in normal sinus rhythm today. BP was initially elevated to 144/100 but improved to 138/82 on most recent check. I verified with the patient that he is no longer on Midodrine. - Will plan to continue with  low-dose Coreg for rate control. He denies any evidence of active bleeding and will remain on Coumadin for anticoagulation. Appreciate the assistance of Vashti HeyLisa Reid, RN as she did check his INR today since this had not been followed since initiation and INR was within range at 2.9.  2. PVD - s/p bilateral fasciotomy and embolectomy in 08/2017. He does describe symptoms which seem consistent with claudication over the past several months. He had been referred to Vascular Surgery and has follow-up studies along with an appointment scheduled in 07/2018. Was advised to keep follow-up. He does have palpable distal pulses on examination today. - Will continue on statin therapy. Not on ASA given the need for anticoagulation.  3. 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 He was previously on Eliquis but self-discontinued this due to cost. He has been started on Coumadin as outlined above. - continue with anticoagulation and statin therapy.   4. HLD - Followed by PCP. Goal LDL is less than 70 in the setting of prior CVA and known PVD. He remains on Atorvastatin 20 mg daily.   Medication Adjustments/Labs and Tests Ordered: Current medicines are reviewed at length with the patient today.  Concerns regarding medicines are outlined above.  Medication changes, Labs and Tests ordered today are listed in the Patient Instructions below. Patient Instructions  Medication Instructions:   Your physician has recommended you make the  following change in your medication:   Stop midodrine.  Continue all other medications the same.  Labwork:  NONE  Testing/Procedures:  NONE  Follow-Up:  Your physician recommends that you schedule a follow-up appointment in: 3 months with Dr. Antoine PocheHochrein at the [facility] office in [place].  Any Other Special Instructions Will Be Listed Below (If Applicable).  If you need a refill on your cardiac medications before your next appointment, please call your pharmacy.   Signed, Ellsworth LennoxBrittany M Marcelle Bebout, PA-C  07/18/2018 4:57 PM     [facility] [address] [place], [place] [address] Phone: [phone]

## 2018-07-18 ENCOUNTER — Ambulatory Visit (INDEPENDENT_AMBULATORY_CARE_PROVIDER_SITE_OTHER): Payer: Medicare Other | Admitting: *Deleted

## 2018-07-18 ENCOUNTER — Encounter: Payer: Self-pay | Admitting: Student

## 2018-07-18 ENCOUNTER — Ambulatory Visit (INDEPENDENT_AMBULATORY_CARE_PROVIDER_SITE_OTHER): Payer: Medicare Other | Admitting: Student

## 2018-07-18 VITALS — BP 138/82 | HR 54 | Ht 73.0 in | Wt 183.4 lb

## 2018-07-18 DIAGNOSIS — Z79899 Other long term (current) drug therapy: Secondary | ICD-10-CM

## 2018-07-18 DIAGNOSIS — I4891 Unspecified atrial fibrillation: Secondary | ICD-10-CM | POA: Diagnosis not present

## 2018-07-18 DIAGNOSIS — Z8673 Personal history of transient ischemic attack (TIA), and cerebral infarction without residual deficits: Secondary | ICD-10-CM

## 2018-07-18 DIAGNOSIS — I739 Peripheral vascular disease, unspecified: Secondary | ICD-10-CM | POA: Diagnosis not present

## 2018-07-18 DIAGNOSIS — Z5181 Encounter for therapeutic drug level monitoring: Secondary | ICD-10-CM

## 2018-07-18 DIAGNOSIS — Z7901 Long term (current) use of anticoagulants: Secondary | ICD-10-CM

## 2018-07-18 DIAGNOSIS — E785 Hyperlipidemia, unspecified: Secondary | ICD-10-CM | POA: Diagnosis not present

## 2018-07-18 DIAGNOSIS — I639 Cerebral infarction, unspecified: Secondary | ICD-10-CM

## 2018-07-18 DIAGNOSIS — I480 Paroxysmal atrial fibrillation: Secondary | ICD-10-CM | POA: Diagnosis not present

## 2018-07-18 LAB — POCT INR: INR: 2.9 (ref 2.0–3.0)

## 2018-07-18 MED ORDER — WARFARIN SODIUM 5 MG PO TABS: 5.0000 mg | ORAL_TABLET | Freq: Every day | ORAL | 4 refills | Status: DC

## 2018-07-18 NOTE — Patient Instructions (Signed)
Medication Instructions:   Your physician has recommended you make the following change in your medication:   Stop midodrine.  Continue all other medications the same.  Labwork:  NONE  Testing/Procedures:  NONE  Follow-Up:  Your physician recommends that you schedule a follow-up appointment in: 3 months with Dr. Antoine PocheHochrein at the [facility] office in [place].  Any Other Special Instructions Will Be Listed Below (If Applicable).  If you need a refill on your cardiac medications before your next appointment, please call your pharmacy.

## 2018-07-18 NOTE — Patient Instructions (Signed)
Continue coumadin 1 tablet (5mg ) daily Recheck in 3 weeks

## 2018-08-14 ENCOUNTER — Ambulatory Visit (HOSPITAL_COMMUNITY): Payer: Medicare Other

## 2018-08-16 ENCOUNTER — Ambulatory Visit (HOSPITAL_COMMUNITY): Payer: Medicare Other

## 2018-08-16 ENCOUNTER — Ambulatory Visit: Payer: Self-pay | Admitting: Family

## 2018-10-11 ENCOUNTER — Encounter: Payer: Self-pay | Admitting: Family

## 2018-10-11 ENCOUNTER — Ambulatory Visit (HOSPITAL_COMMUNITY): Payer: Medicare Other

## 2018-10-11 ENCOUNTER — Ambulatory Visit (HOSPITAL_COMMUNITY): Payer: Medicare Other | Attending: Family

## 2018-10-11 ENCOUNTER — Ambulatory Visit: Payer: Self-pay | Admitting: Family

## 2018-10-31 ENCOUNTER — Ambulatory Visit: Payer: Medicare Other | Admitting: Cardiology

## 2018-12-03 ENCOUNTER — Other Ambulatory Visit: Payer: Self-pay

## 2018-12-03 ENCOUNTER — Ambulatory Visit (HOSPITAL_COMMUNITY)
Admission: RE | Admit: 2018-12-03 | Discharge: 2018-12-03 | Disposition: A | Discharge status: Home / Self Care | Payer: Medicare Other | Source: Ambulatory Visit | Attending: Internal Medicine | Admitting: Internal Medicine

## 2018-12-03 DIAGNOSIS — I6789 Other cerebrovascular disease: Secondary | ICD-10-CM | POA: Diagnosis not present

## 2018-12-03 DIAGNOSIS — Z6821 Body mass index (BMI) 21.0-21.9, adult: Secondary | ICD-10-CM | POA: Diagnosis not present

## 2018-12-03 DIAGNOSIS — I4819 Other persistent atrial fibrillation: Secondary | ICD-10-CM | POA: Diagnosis not present

## 2018-12-03 DIAGNOSIS — I739 Peripheral vascular disease, unspecified: Secondary | ICD-10-CM | POA: Diagnosis not present

## 2018-12-03 NOTE — Op Note (Signed)
EKG completed on pt. Abnormal results called to Dr. Roxan HockeyX. Hasanaj at [phone]. No new orders. Reminded pt and caregiver to fill new prescriptions per Dr. Roxan HockeyX. Hasanaj request

## 2018-12-21 ENCOUNTER — Ambulatory Visit (HOSPITAL_COMMUNITY)
Admission: RE | Admit: 2018-12-21 | Discharge: 2018-12-21 | Disposition: A | Discharge status: Home / Self Care | Payer: Medicare Other | Source: Ambulatory Visit | Attending: Family | Admitting: Family

## 2018-12-21 ENCOUNTER — Ambulatory Visit (INDEPENDENT_AMBULATORY_CARE_PROVIDER_SITE_OTHER)
Admission: RE | Admit: 2018-12-21 | Discharge: 2018-12-21 | Disposition: A | Discharge status: Home / Self Care | Payer: Medicare Other | Source: Ambulatory Visit | Attending: Family | Admitting: Family

## 2018-12-21 DIAGNOSIS — I779 Disorder of arteries and arterioles, unspecified: Secondary | ICD-10-CM | POA: Diagnosis not present

## 2018-12-25 ENCOUNTER — Ambulatory Visit: Payer: Self-pay | Admitting: Family

## 2019-01-04 NOTE — Progress Notes (Signed)
HISTORY AND PHYSICAL     CC:  Follow up Requesting Provider:  Bing NeighborsHarris, Kimberly S, FNP  HPI: This is a 68 y.o. male who is s/p right popliteal and tibial endarterectomy with patch angioplasty of the right popliteal artery and TP trunk on 08/30/17 by Dr. Darrick PennaFields.  On 08/26/17, he underwent bilateral embolectomy of iliac, femoral, popliteal arteries and 4 compartment fasciotomies bilaterally also by Dr. Darrick PennaFields.  On 08/28/17, he was taken to the OR for fasciotomy closure also by Dr. Darrick PennaFields.  He was found to have large amount of embolic material.  He followed up with NP on 09/22/17.  He has not had any further follow up with us until this month.    He has hx of afib and was on Eliquis, but was changed over to coumadin.  He stopped taking this about a month after he started it.  He recently started it back (past month or so) but has not had any blood work to check INR.      He has hx of CVA with right hemiparesis. He did have a carotid duplex on 08/23/17, which revealed minimal plaque in the bilateral ICA.  He did have some right hemiparesis and aphasia after his stroke, but is recovering.    He was driving and walking and getting along well until about 6 months ago.  His family member with him states that about 6 months ago, [facility], but all of a sudden, he could not walk due to his right leg giving out on him.  Since then, he has difficulty walking due to pain and cannot walk very far.  He does not have any non healing wounds.  He states his right leg is worse than his left. He is now back on his statin and beta blocker as well as his coumadin as mentioned above.   Pt had an appointment earlier this month but was cancelled as his mother died around that time.  The pt is on a statin for cholesterol management.  The pt is not diabetic.   The pt is on a BB for hypertension.   Tobacco hx:  Never smoked The pt is not aspirin.  He is on coumadin  Past Medical  History:  Diagnosis Date  . Afib (HCC)    a. Paroxysmal --> previous issues with bradycardia while on Amiodarone and he refused PPM placement. Rate-control strategy pursued.   . Peripheral vascular disease (HCC)   . Stroke [facility](HCC)    Aphasia, right sided weakness     Past Surgical History:  Procedure Laterality Date  . EMBOLECTOMY Right 08/30/2017   Procedure: Right popliteal tibial endarectomy with patch angioplasty;  Surgeon: Sherren KernsFields, [name], MD;  [facility];  Service: Vascular;  Laterality: Right;  . FASCIOTOMY CLOSURE Bilateral 08/28/2017   Procedure: FASCIOTOMY CLOSURE/BILATERAL  lower legs;  Surgeon: Sherren KernsFields, [name], MD;  [facility];  Service: Vascular;  Laterality: Bilateral;  . FEMORAL-POPLITEAL BYPASS GRAFT Bilateral 08/26/2017   Procedure: Bilateral FEMORAL EMBOLECTOMY with  BILATERAL Four Compartment  FASCIOTOMIES.;  Surgeon: Sherren KernsFields, [name], MD;  [facility];  Service: Vascular;  Laterality: Bilateral;    No Known Allergies  Current Outpatient Medications  Medication Sig Dispense Refill  . atorvastatin (LIPITOR) 20 MG tablet Take 1 tablet (20 mg total) by mouth daily at 6 PM. 30 tablet 11  . carvedilol (COREG) 3.125 MG tablet Take 1 tablet (3.125 mg total) by mouth 2 (two) times daily  with a meal. 60 tablet 11  . warfarin (COUMADIN) 5 MG tablet Take 1 tablet (5 mg total) by mouth daily. 30 tablet 4   No current facility-administered medications for this visit.     Family History  Problem Relation Age of Onset  . Stroke Sister     Social History   Socioeconomic History  . Marital status: Single    Spouse name: Not on file  . Number of children: Not on file  . Years of education: Not on file  . Highest education level: Not on file  Occupational History  . Not on file  Social Needs  . Financial resource strain: Not on file  . Food insecurity:    Worry: Not on file    Inability: Not on file  . Transportation needs:    Medical: Not on file     Non-medical: Not on file  Tobacco Use  . Smoking status: Never Smoker  . Smokeless tobacco: Never Used  Substance and Sexual Activity  . Alcohol use: No  . Drug use: No  . Sexual activity: Not on file  Lifestyle  . Physical activity:    Days per week: Not on file    Minutes per session: Not on file  . Stress: Not on file  Relationships  . Social connections:    Talks on phone: Not on file    Gets together: Not on file    Attends religious service: Not on file    Active member of club or organization: Not on file    Attends meetings of clubs or organizations: Not on file    Relationship status: Not on file  . Intimate partner violence:    Fear of current or ex partner: Not on file    Emotionally abused: Not on file    Physically abused: Not on file    Forced sexual activity: Not on file  Other Topics Concern  . Not on file  Social History Narrative  . Not on file     REVIEW OF SYSTEMS:   [X]  denotes positive finding, [ ]  denotes negative finding Cardiac  Comments:  Chest pain or chest pressure:    Shortness of breath upon exertion:    Short of breath when lying flat:    Irregular heart rhythm: x       Vascular    Pain in calf, thigh, or hip brought on by ambulation: x   Pain in feet at night that wakes you up from your sleep:     Blood clot in your veins:    Leg swelling:  x       Pulmonary    Oxygen at home:    Productive cough:     Wheezing:         Neurologic    Sudden weakness in arms or legs:  x   Sudden numbness in arms or legs:     Sudden onset of difficulty speaking or slurred speech:    Temporary loss of vision in one eye:     Problems with dizziness:         Gastrointestinal    Blood in stool:     Vomited blood:         Genitourinary    Burning when urinating:     Blood in urine:        Psychiatric    Major depression:         Hematologic    Bleeding problems:    Problems  with blood clotting too easily:        Skin    Rashes or  ulcers:        Constitutional    Fever or chills:      PHYSICAL EXAMINATION:  Today's Vitals   01/07/19 1324  BP: 113/76  Pulse: 78  Resp: 16  Temp: (!) 97.4 F (36.3 C)  TempSrc: Oral  SpO2: 97%  Weight: 180 lb (81.6 kg)  Height: 6\' 1"  (1.854 m)  PainSc: 10-Worst pain ever   Body mass index is 23.75 kg/m.  General:  WDWN in NAD; vital signs documented above Gait: Not observed HENT: WNL, normocephalic; voice is hoarse Pulmonary: normal non-labored breathing , without Rales, rhonchi,  wheezing Cardiac: regular HR, without  Murmurs without carotid bruits Abdomen: soft, NT, no masses Skin: without rashes Vascular Exam/Pulses:  Right Left  Radial 2+ (normal) 2+ (normal)  Femoral Not palpable Not palpable  Popliteal Not palpable Not palpable  DP Not palpable Not palpable  PT Not palpable Not palpable   Extremities: no non healing wounds; feet are cool to touch.  Fasciotomy and bilateral groin wounds have healed.  Musculoskeletal: no muscle wasting or atrophy  Neurologic: A&O X 3; grips equal bilaterally Psychiatric:  The pt has Normal affect.   Non-Invasive Vascular Imaging:   Ao/IVC/iliac 12/21/2018: Visualization of the Proximal Abdominal Aorta, Superceliac artery, Right CIA Distal artery and Left EIA Mid artery was limited. Occlusion vs. near occlusion of the distal aorta, right CIA, right EIA, left CIA, and left EIA.  ABI's 12/21/2018: Right: Resting right ankle-brachial index indicates critical limb ischemia. The right toe-brachial index is abnormal.  Left: Resting left ankle-brachial index indicates critical left limb ischemia. The left toe-brachial index is abnormal.  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 Total occlusion noted in the superficial femoral artery. Total occlusion noted in the popliteal artery. Near to total occlusion of DFA.  Left: 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  Total occlusion noted in the deep femoral artery. Total occlusion noted in the superficial femoral artery. Total occlusion noted in the popliteal artery.   Pt meds includes: Statin:  Yes.   Beta Blocker:  Yes.   Aspirin:  No. ACEI:  No. ARB:  No. CCB use:  No Other Antiplatelet/Anticoagulant:  Yes coumadin   ASSESSMENT/PLAN:: 68 y.o. male who is s/p bilateral embolectomy of iliac, femoral, popliteal arteries and 4 compartment fasciotomies bilaterally also by Dr. Darrick Penna and  right popliteal and tibial endarterectomy with patch angioplasty of the right popliteal artery and TP trunk on 08/30/17 by Dr. Darrick Penna and closure of fasciotomies on 08/28/17 here for follow up.   -pt here today to follow up on studies done earlier this month.  Pt has not had follow up since.  He had studies earlier this month that revealed he had an occluded aorta, and occlusion of bilateral CFA, SFA, and popliteal arteries.  He stopped taking his AC about a month after he got out of the hospital.  He probably occluded about 6 months ago when he all of a sudden couldn't walk without pain issues.  He has since restarted his coumadin about a month ago, but has not had an INR checked since.  I advised him to call and get appointment to check that today or tomorrow and explained that INR can vary.  He and his family member expressed understanding. -will try to have CTA chest/abdomen/pelvis with bilateral runoff on Thursday and  see Dr. Darrick PennaFields that afternoon.  -discussed with pt to protect his feet that if he gets a sore, he does not have enough blood flow for healing and could be limb threatening.  He and family member expressed understanding.    Doreatha MassedSamantha Lendell Gallick, PA-C [facility] [phone]  Clinic MD:  Myra GianottiBrabham

## 2019-01-07 ENCOUNTER — Ambulatory Visit (INDEPENDENT_AMBULATORY_CARE_PROVIDER_SITE_OTHER): Payer: Medicare Other | Admitting: Physician Assistant

## 2019-01-07 ENCOUNTER — Other Ambulatory Visit: Payer: Self-pay | Admitting: *Deleted

## 2019-01-07 ENCOUNTER — Encounter: Payer: Self-pay | Admitting: Physician Assistant

## 2019-01-07 ENCOUNTER — Other Ambulatory Visit: Payer: Self-pay

## 2019-01-07 VITALS — BP 113/76 | HR 78 | Temp 97.4°F | Resp 16 | Ht 73.0 in | Wt 180.0 lb

## 2019-01-07 DIAGNOSIS — Z6821 Body mass index (BMI) 21.0-21.9, adult: Secondary | ICD-10-CM | POA: Diagnosis not present

## 2019-01-07 DIAGNOSIS — I779 Disorder of arteries and arterioles, unspecified: Secondary | ICD-10-CM

## 2019-01-07 DIAGNOSIS — I739 Peripheral vascular disease, unspecified: Secondary | ICD-10-CM | POA: Diagnosis not present

## 2019-01-07 DIAGNOSIS — I7410 Embolism and thrombosis of unspecified parts of aorta: Principal | ICD-10-CM

## 2019-01-07 DIAGNOSIS — I700 Atherosclerosis of aorta: Secondary | ICD-10-CM

## 2019-01-07 DIAGNOSIS — I4819 Other persistent atrial fibrillation: Secondary | ICD-10-CM | POA: Diagnosis not present

## 2019-01-07 DIAGNOSIS — I6789 Other cerebrovascular disease: Secondary | ICD-10-CM | POA: Diagnosis not present

## 2019-01-10 ENCOUNTER — Encounter: Payer: Self-pay | Admitting: Vascular Surgery

## 2019-01-10 ENCOUNTER — Telehealth: Payer: Self-pay | Admitting: Cardiology

## 2019-01-10 ENCOUNTER — Encounter: Payer: Self-pay | Admitting: *Deleted

## 2019-01-10 ENCOUNTER — Other Ambulatory Visit: Payer: Self-pay

## 2019-01-10 ENCOUNTER — Ambulatory Visit (INDEPENDENT_AMBULATORY_CARE_PROVIDER_SITE_OTHER): Payer: Medicare Other | Admitting: Vascular Surgery

## 2019-01-10 ENCOUNTER — Ambulatory Visit (HOSPITAL_COMMUNITY)
Admission: RE | Admit: 2019-01-10 | Discharge: 2019-01-10 | Disposition: A | Discharge status: Home / Self Care | Payer: Medicare Other | Source: Ambulatory Visit | Attending: Vascular Surgery | Admitting: Vascular Surgery

## 2019-01-10 VITALS — BP 116/75 | HR 92 | Temp 97.9°F | Resp 16 | Ht 73.0 in | Wt 186.0 lb

## 2019-01-10 DIAGNOSIS — I480 Paroxysmal atrial fibrillation: Secondary | ICD-10-CM | POA: Diagnosis not present

## 2019-01-10 DIAGNOSIS — I700 Atherosclerosis of aorta: Secondary | ICD-10-CM | POA: Diagnosis not present

## 2019-01-10 DIAGNOSIS — K8020 Calculus of gallbladder without cholecystitis without obstruction: Secondary | ICD-10-CM | POA: Diagnosis not present

## 2019-01-10 DIAGNOSIS — I779 Disorder of arteries and arterioles, unspecified: Secondary | ICD-10-CM | POA: Diagnosis not present

## 2019-01-10 DIAGNOSIS — I7410 Embolism and thrombosis of unspecified parts of aorta: Secondary | ICD-10-CM | POA: Diagnosis not present

## 2019-01-10 DIAGNOSIS — I739 Peripheral vascular disease, unspecified: Secondary | ICD-10-CM

## 2019-01-10 LAB — POCT I-STAT CREATININE: Creatinine, Ser: 1.3 mg/dL — ABNORMAL HIGH (ref 0.61–1.24)

## 2019-01-10 MED ORDER — IOPAMIDOL (ISOVUE-370) INJECTION 76%
120.0000 mL | Freq: Once | INTRAVENOUS | Status: AC | PRN
  Administered 2019-01-10: 150 mL via INTRAVENOUS

## 2019-01-10 NOTE — Telephone Encounter (Signed)
New message        [facility] Pre-operative Risk Assessment    Request for surgical clearance:  1. What type of surgery is being performed?  Endarterectomy   2. When is this surgery scheduled? 01/28/19  3. What type of clearance is required (medical clearance vs. Pharmacy clearance to hold med vs. Both)? Cardiac Clearance   4. Are there any medications that need to be held prior to surgery and how long? To be determined by MD  5. Practice name and name of physician performing surgery?  [facility] R[facility]  6. What is your office phone number [phone]   7.   What is your office fax number [phone]  8.   Anesthesia type (None, local, MAC, general) ? Brooks Sailors Penn 01/10/2019, 3:01 PM  _________________________________________________________________   (provider comments below)

## 2019-01-10 NOTE — H&P (View-Only) (Signed)
Patient name: [name] [name] MRN: [id number] DOB: 01/31/1951 Sex: male  HPI: [name] [name] is a 68 y.o. male, turns for follow-up today.  He was seen by 1 of our PAs last week and noted to have minimal flow to both lower extremities.  He previously had a right popliteal and tibial endarterectomy with patch angioplasty bilateral fasciotomy lateral femoral embolectomies September 2018.  This was thought to be secondary to atrial fibrillation.  The patient subsequently stopped his warfarin.  About 6 months ago he had sudden onset of weakness in both lower extremities.  Since that time he has only been able to walk about 20 steps before experiencing pain in both calves.  He denies rest pain.  He denies nonhealing wounds.  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  The patient has resumed his warfarin a few weeks ago.  He is also on Plavix.  He returns today after CT angiogram with runoff.  Past Medical History:  Diagnosis Date  . Afib (HCC)    a. Paroxysmal --> previous issues with bradycardia while on Amiodarone and he refused PPM placement. Rate-control strategy pursued.   . Peripheral vascular disease (HCC)   . Stroke [facility](HCC)    Aphasia, right sided weakness    Past Surgical History:  Procedure Laterality Date  . EMBOLECTOMY Right 08/30/2017   Procedure: Right popliteal tibial endarectomy with patch angioplasty;  Surgeon: Sherren KernsFields, [name], MD;  [facility];  Service: Vascular;  Laterality: Right;  . FASCIOTOMY CLOSURE Bilateral 08/28/2017   Procedure: FASCIOTOMY CLOSURE/BILATERAL  lower legs;  Surgeon: Sherren KernsFields, [name], MD;  [facility];  Service: Vascular;  Laterality: Bilateral;  . FEMORAL-POPLITEAL BYPASS GRAFT Bilateral 08/26/2017   Procedure: Bilateral FEMORAL EMBOLECTOMY with  BILATERAL Four Compartment  FASCIOTOMIES.;  Surgeon:  Sherren KernsFields, [name], MD;  [facility];  Service: Vascular;  Laterality: Bilateral;    Family History  Problem Relation Age of Onset  . Stroke Sister     SOCIAL HISTORY: Social History   Socioeconomic History  . Marital status: Single    Spouse name: Not on file  . Number of children: Not on file  . Years of education: Not on file  . Highest education level: Not on file  Occupational History  . Not on file  Social Needs  . Financial resource strain: Not on file  . Food insecurity:    Worry: Not on file    Inability: Not on file  . Transportation needs:    Medical: Not on file    Non-medical: Not on file  Tobacco Use  . Smoking status: Never Smoker  . Smokeless tobacco: Never Used  Substance and Sexual Activity  . Alcohol use: No  . Drug use: No  . Sexual activity: Not on file  Lifestyle  . Physical activity:    Days per week: Not on file    Minutes per session: Not on file  . Stress: Not on file  Relationships  . Social connections:    Talks on phone: Not on file    Gets together: Not on file    Attends religious service: Not on file    Active member of club or organization: Not on file    Attends meetings of clubs or organizations: Not on file    Relationship status: Not on file  . Intimate  partner violence:    Fear of current or ex partner: Not on file    Emotionally abused: Not on file    Physically abused: Not on file    Forced sexual activity: Not on file  Other Topics Concern  . Not on file  Social History Narrative  . Not on file    No Known Allergies  Current Outpatient Medications  Medication Sig Dispense Refill  . atorvastatin (LIPITOR) 20 MG tablet Take 1 tablet (20 mg total) by mouth daily at 6 PM. 30 tablet 11  . carvedilol (COREG) 3.125 MG tablet Take 1 tablet (3.125 mg total) by mouth 2 (two) times daily with a meal. 60 tablet 11  . warfarin (COUMADIN) 1 MG tablet Take 1.5 mg by mouth daily.     No current facility-administered  medications for this visit.     ROS:   General:  No weight loss, Fever, chills  HEENT: No recent headaches, no nasal bleeding, no visual changes, no sore throat  Neurologic: No dizziness, blackouts, seizures. No recent symptoms of stroke or mini- stroke. No recent episodes of slurred speech, or temporary blindness.  Cardiac: No recent episodes of chest pain/pressure, no shortness of breath at rest.  No shortness of breath with exertion.  + history of atrial fibrillation or irregular heartbeat  Vascular: No history of rest pain in feet.  + history of claudication.  No history of non-healing ulcer, No history of DVT   Pulmonary: No home oxygen, no productive cough, no hemoptysis,  No asthma or wheezing  Musculoskeletal:  [ ]  Arthritis, [ ]  Low back pain,  [ ]  Joint pain  Hematologic:No history of hypercoagulable state.  No history of easy bleeding.  No history of anemia  Gastrointestinal: No hematochezia or melena,  No gastroesophageal reflux, no trouble swallowing  Urinary: [ ]  chronic Kidney disease, [ ]  on HD - [ ]  MWF or [ ]  TTHS, [ ]  Burning with urination, [ ]  Frequent urination, [ ]  Difficulty urinating;   Skin: No rashes  Psychological: No history of anxiety,  No history of depression   Physical Examination  Vitals:   01/10/19 1420  BP: 116/75  Pulse: 92  Resp: 16  Temp: 97.9 F (36.6 C)  TempSrc: Oral  SpO2: 97%  Weight: 186 lb (84.4 kg)  Height: 6\' 1"  (1.854 m)    Body mass index is 24.54 kg/m.  General:  Alert and oriented, no acute distress HEENT: Normal Neck: No bruit or JVD Pulmonary: Clear to auscultation bilaterally Cardiac: Regular Rate and Rhythm  Abdomen: Soft, non-tender, non-distended, no mass, no scars Skin: No rash, no ulcer Extremity Pulses:  2+ radial, brachial, absent femoral, dorsalis pedis, posterior tibial pulses bilaterally Musculoskeletal: No deformity or edema  Neurologic: Upper and lower extremity motor 5/5 and symmetric  DATA:   CT angiogram was performed today.  I reviewed these images.  This shows an infrarenal aortic occlusion patent mesenteric and renal vessels occlusion of both common external and internal iliac arteries bilateral common femoral arteries there is reconstitution of the mid right superficial femoral artery and right profunda artery.  There are similar findings in the left leg.  The right distal superficial femoral artery is occluded.  The right popliteal artery is occluded.  The left popliteal artery is occluded.  There is reconstitution of all 3 tibial vessels in both feet.  ABIs from previous office visit were reviewed right side is 0.3 left is absent  ASSESSMENT: Aortic occlusion with short distance claudication  most likely secondary to recurrent emboli from his atrial fibrillation since this was sudden onset.  Since it is now been 6 months it is most likely not going to be amenable to embolectomy and most likely will require aortobifemoral reconstruction  PLAN: #1 patient will stop his Plavix today and convert this over to aspirin.  He will continue his warfarin until a few days before his aortobifemoral bypass.  We will have him see Dr. [name] who has seen him previously for cardiac preop risk stratification.  Risk benefits possible complications and procedure details of aortobifemoral bypass were explained to the patient and his friend today.  These include but are not limited to bleeding infection death myocardial events risk of transfusion.  He wishes to proceed.  [name], MD [facility] Office: [phone] Pager: [phone]

## 2019-01-10 NOTE — Progress Notes (Signed)
 Patient name: [name] [name] MRN: [id number] DOB: 08/29/1951 Sex: male  HPI: [name] [name] is a 68 y.o. male, turns for follow-up today.  He was seen by 1 of our PAs last week and noted to have minimal flow to both lower extremities.  He previously had a right popliteal and tibial endarterectomy with patch angioplasty bilateral fasciotomy lateral femoral embolectomies September 2018.  This was thought to be secondary to atrial fibrillation.  The patient subsequently stopped his warfarin.  About 6 months ago he had sudden onset of weakness in both lower extremities.  Since that time he has only been able to walk about 20 steps before experiencing pain in both calves.  He denies rest pain.  He denies nonhealing wounds.  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  The patient has resumed his warfarin a few weeks ago.  He is also on Plavix.  He returns today after CT angiogram with runoff.  Past Medical History:  Diagnosis Date  . Afib (HCC)    a. Paroxysmal --> previous issues with bradycardia while on Amiodarone and he refused PPM placement. Rate-control strategy pursued.   . Peripheral vascular disease (HCC)   . Stroke (HCC)    Aphasia, right sided weakness    Past Surgical History:  Procedure Laterality Date  . EMBOLECTOMY Right 08/30/2017   Procedure: Right popliteal tibial endarectomy with patch angioplasty;  Surgeon: Fields, [name], MD;  Location: [facility] OR;  Service: Vascular;  Laterality: Right;  . FASCIOTOMY CLOSURE Bilateral 08/28/2017   Procedure: FASCIOTOMY CLOSURE/BILATERAL  lower legs;  Surgeon: Fields, [name], MD;  Location: [facility] OR;  Service: Vascular;  Laterality: Bilateral;  . FEMORAL-POPLITEAL BYPASS GRAFT Bilateral 08/26/2017   Procedure: Bilateral FEMORAL EMBOLECTOMY with  BILATERAL Four Compartment  FASCIOTOMIES.;  Surgeon:  Fields, [name], MD;  Location: [facility] OR;  Service: Vascular;  Laterality: Bilateral;    Family History  Problem Relation Age of Onset  . Stroke Sister     SOCIAL HISTORY: Social History   Socioeconomic History  . Marital status: Single    Spouse name: Not on file  . Number of children: Not on file  . Years of education: Not on file  . Highest education level: Not on file  Occupational History  . Not on file  Social Needs  . Financial resource strain: Not on file  . Food insecurity:    Worry: Not on file    Inability: Not on file  . Transportation needs:    Medical: Not on file    Non-medical: Not on file  Tobacco Use  . Smoking status: Never Smoker  . Smokeless tobacco: Never Used  Substance and Sexual Activity  . Alcohol use: No  . Drug use: No  . Sexual activity: Not on file  Lifestyle  . Physical activity:    Days per week: Not on file    Minutes per session: Not on file  . Stress: Not on file  Relationships  . Social connections:    Talks on phone: Not on file    Gets together: Not on file    Attends religious service: Not on file    Active member of club or organization: Not on file    Attends meetings of clubs or organizations: Not on file    Relationship status: Not on file  . Intimate   partner violence:    Fear of current or ex partner: Not on file    Emotionally abused: Not on file    Physically abused: Not on file    Forced sexual activity: Not on file  Other Topics Concern  . Not on file  Social History Narrative  . Not on file    No Known Allergies  Current Outpatient Medications  Medication Sig Dispense Refill  . atorvastatin (LIPITOR) 20 MG tablet Take 1 tablet (20 mg total) by mouth daily at 6 PM. 30 tablet 11  . carvedilol (COREG) 3.125 MG tablet Take 1 tablet (3.125 mg total) by mouth 2 (two) times daily with a meal. 60 tablet 11  . warfarin (COUMADIN) 1 MG tablet Take 1.5 mg by mouth daily.     No current facility-administered  medications for this visit.     ROS:   General:  No weight loss, Fever, chills  HEENT: No recent headaches, no nasal bleeding, no visual changes, no sore throat  Neurologic: No dizziness, blackouts, seizures. No recent symptoms of stroke or mini- stroke. No recent episodes of slurred speech, or temporary blindness.  Cardiac: No recent episodes of chest pain/pressure, no shortness of breath at rest.  No shortness of breath with exertion.  + history of atrial fibrillation or irregular heartbeat  Vascular: No history of rest pain in feet.  + history of claudication.  No history of non-healing ulcer, No history of DVT   Pulmonary: No home oxygen, no productive cough, no hemoptysis,  No asthma or wheezing  Musculoskeletal:  [ ] Arthritis, [ ] Low back pain,  [ ] Joint pain  Hematologic:No history of hypercoagulable state.  No history of easy bleeding.  No history of anemia  Gastrointestinal: No hematochezia or melena,  No gastroesophageal reflux, no trouble swallowing  Urinary: [ ] chronic Kidney disease, [ ] on HD - [ ] MWF or [ ] TTHS, [ ] Burning with urination, [ ] Frequent urination, [ ] Difficulty urinating;   Skin: No rashes  Psychological: No history of anxiety,  No history of depression   Physical Examination  Vitals:   01/10/19 1420  BP: 116/75  Pulse: 92  Resp: 16  Temp: 97.9 F (36.6 C)  TempSrc: Oral  SpO2: 97%  Weight: 186 lb (84.4 kg)  Height: 6' 1" (1.854 m)    Body mass index is 24.54 kg/m.  General:  Alert and oriented, no acute distress HEENT: Normal Neck: No bruit or JVD Pulmonary: Clear to auscultation bilaterally Cardiac: Regular Rate and Rhythm  Abdomen: Soft, non-tender, non-distended, no mass, no scars Skin: No rash, no ulcer Extremity Pulses:  2+ radial, brachial, absent femoral, dorsalis pedis, posterior tibial pulses bilaterally Musculoskeletal: No deformity or edema  Neurologic: Upper and lower extremity motor 5/5 and symmetric  DATA:   CT angiogram was performed today.  I reviewed these images.  This shows an infrarenal aortic occlusion patent mesenteric and renal vessels occlusion of both common external and internal iliac arteries bilateral common femoral arteries there is reconstitution of the mid right superficial femoral artery and right profunda artery.  There are similar findings in the left leg.  The right distal superficial femoral artery is occluded.  The right popliteal artery is occluded.  The left popliteal artery is occluded.  There is reconstitution of all 3 tibial vessels in both feet.  ABIs from previous office visit were reviewed right side is 0.3 left is absent  ASSESSMENT: Aortic occlusion with short distance claudication   most likely secondary to recurrent emboli from his atrial fibrillation since this was sudden onset.  Since it is now been 6 months it is most likely not going to be amenable to embolectomy and most likely will require aortobifemoral reconstruction  PLAN: #1 patient will stop his Plavix today and convert this over to aspirin.  He will continue his warfarin until a few days before his aortobifemoral bypass.  We will have him see Dr. [name] who has seen him previously for cardiac preop risk stratification.  Risk benefits possible complications and procedure details of aortobifemoral bypass were explained to the patient and his friend today.  These include but are not limited to bleeding infection death myocardial events risk of transfusion.  He wishes to proceed.  [name], MD [facility] Office: [phone] Pager: [phone]

## 2019-01-11 ENCOUNTER — Other Ambulatory Visit: Payer: Self-pay | Admitting: *Deleted

## 2019-01-11 ENCOUNTER — Telehealth: Payer: Self-pay

## 2019-01-11 NOTE — Telephone Encounter (Signed)
Duplicate request. Will remove from the preop pool.   Beatriz Stallion, PA-C 01/11/19

## 2019-01-11 NOTE — Telephone Encounter (Signed)
   [facility] Pre-operative Risk Assessment    Request for surgical clearance:  1. What type of surgery is being performed? Aorto-bifemeral bypass, bilateral femoral endarterectomy    2. When is this surgery scheduled? 01/28/2019   3. What type of clearance is required (medical clearance vs. Pharmacy clearance to hold med vs. Both)? both  4. Are there any medications that need to be held prior to surgery and how long? Coumadin  x5 days   5. Practice name and name of physician performing surgery? [facility] [place]   6. What is your office phone number [phone]     7.   What is your office fax number [phone]  8.   Anesthesia type (None, local, MAC, general) ? general   Ena Dawley 01/11/2019, 3:06 PM  _________________________________________________________________   (provider comments below)

## 2019-01-11 NOTE — Telephone Encounter (Signed)
Will ask pharmacy to comment on Coumadin then contact the patient.  Corine Shelter PA-C 01/11/2019 3:35 PM

## 2019-01-14 NOTE — Telephone Encounter (Signed)
Patient with diagnosis of atrial fibrillation on warfarin for anticoagulation.    Procedure: endareterctomy Date of procedure: 01/28/2019  CHADS2-VASc score of  4 (, AGE, , stroke/tia x 2, CAD,)  CrCl 69.4 Platelet count 267  Per office protocol, patient can hold warfarin for 5 days prior to procedure.     Patient will need bridging with Lovenox (enoxaparin) around procedure.  [facility], please contact patient to determine who will need to follow.

## 2019-01-14 NOTE — Telephone Encounter (Signed)
   Primary Cardiologist: Rollene Rotunda, MD  Chart reviewed as part of pre-operative protocol coverage.   Left voicemail for patient to call back. Will clarify who is managing his coumadin to determine who will coordinate bridging with lovenox for upcoming surgery.    Beatriz Stallion, PA-C 01/14/2019, 9:46 AM

## 2019-01-21 NOTE — Pre-Procedure Instructions (Signed)
[name] [name]  01/21/2019      [facility] - [place], [place] - [address] [place] [address] Phone: [phone] Fax: [phone]  [facility] [address], [place] - [address] [place] [address] [place] [address] [place] [address] Phone: [phone] Fax: [phone]    Your procedure is scheduled on Monday, February 11th.  Report to [facility] Admitting at 5:30 A.M.  Call this number if you have problems the morning of surgery:  [phone]   Remember:  Do not eat or drink after midnight.   Take these medicines the morning of surgery with A SIP OF WATER  carvedilol (COREG)   Your surgeon's instructions are to stop Warfarin (Coumadin) 5 days prior to surgery (01/24/19).  As of today, STOP taking any Aspirin (unless otherwise instructed by your surgeon), Aleve, Naproxen, Ibuprofen, Motrin, Advil, Goody's, BC's, all herbal medications, fish oil, and all vitamins.       Do not wear jewelry.  Do not wear lotions, powders, or colognes, or deodorant.  Men may shave face and neck.  Do not bring valuables to the hospital.  [facility] is not responsible for any belongings or valuables.  Contacts, dentures or bridgework, eyeglasses, hearing aids may not be worn into surgery.  Leave your suitcase in the car.  After surgery it may be brought to your room.  For patients admitted to the hospital, discharge time will be determined by your treatment team.  Patients discharged the day of surgery will not be allowed to drive home.   Special instructions:   [facility]- Preparing For Surgery  Before surgery, you can play an important role. Because skin is not sterile, your skin needs to be as free of germs as possible. You can reduce the number of germs on your skin by washing with CHG (chlorahexidine gluconate) Soap before surgery.  CHG is an antiseptic cleaner which kills germs and bonds with the skin to continue killing germs even  after washing.    Oral Hygiene is also important to reduce your risk of infection.  Remember - BRUSH YOUR TEETH THE MORNING OF SURGERY WITH YOUR REGULAR TOOTHPASTE  Please do not use if you have an allergy to CHG or antibacterial soaps. If your skin becomes reddened/irritated stop using the CHG.  Do not shave (including legs and underarms) for at least 48 hours prior to first CHG shower. It is OK to shave your face.  Please follow these instructions carefully.   1. Shower the NIGHT BEFORE SURGERY and the MORNING OF SURGERY with CHG.   2. If you chose to wash your hair, wash your hair first as usual with your normal shampoo.  3. After you shampoo, rinse your hair and body thoroughly to remove the shampoo.  4. Use CHG as you would any other liquid soap. You can apply CHG directly to the skin and wash gently with a scrungie or a clean washcloth.   5. Apply the CHG Soap to your body ONLY FROM THE NECK DOWN.  Do not use on open wounds or open sores. Avoid contact with your eyes, ears, mouth and genitals (private parts). Wash Face and genitals (private parts)  with your normal soap.  6. Wash thoroughly, paying special attention to the area where your surgery will be performed.  7. Thoroughly rinse your body with warm water from the neck down.  8. DO NOT shower/wash with your normal soap after using and rinsing  off the CHG Soap.  9. Pat yourself dry with a CLEAN TOWEL.  10. Wear CLEAN PAJAMAS to bed the night before surgery, wear comfortable clothes the morning of surgery  11. Place CLEAN SHEETS on your bed the night of your first shower and DO NOT SLEEP WITH PETS.    Day of Surgery:  Do not apply any deodorants/lotions.  Please wear clean clothes to the hospital/surgery center.   Remember to brush your teeth WITH YOUR REGULAR TOOTHPASTE.    Please read over the following fact sheets that you were given.

## 2019-01-21 NOTE — Progress Notes (Signed)
Cardiology Office Note   Date:  01/22/2019   ID:  [name], [name] 1951/12/08, MRN [id number]  PCP:  Toma Deiters, MD  Cardiologist:  Hochrein  No chief complaint on file.    History of Present Illness: [name] [name] is a 68 y.o. male who presents for ongoing assessment and management of atrial fib, tachy-brady syndrome. He is on coumadin therapy.  Other history includes PVD, s/p bilateral fasciotomy and embolectomy in 08/2017, prior CVA. He was not able to tolerate amiodarone due to bradycardia, he refused PPM implantation. He was continued on digoxin and carvedilol when seen last by [place] on 07/18/2018. He had no complaints on that visit.   He is scheduled for Bi-Femoral bypass on 01/28/2019 and is here today for pre-operative cardiac evaluation. He is on coumadin and our pharmacist is meeting with him to instruct him on coumadin and Lovenox bridging prior to surgery.  Dr. Olena Leatherwood is managing his coumadin as OP.   On review of his medications, he has some mix up on what he is taking and what he is supposed to be taking. His sister was helping him but this did not work out for him. He has his friend Wendi Maya [phone] who is helping him with his medications now.   Past Medical History:  Diagnosis Date  . Afib (HCC)    a. Paroxysmal --> previous issues with bradycardia while on Amiodarone and he refused PPM placement. Rate-control strategy pursued.   . Chronic kidney disease    unknown problem to see kidney  doctor in March in [place]  . Dysrhythmia    Atril Fib  . Peripheral vascular disease (HCC)   . Stroke [facility])    Aphasia, right sided weakness     Past Surgical History:  Procedure Laterality Date  . EMBOLECTOMY Right 08/30/2017   Procedure: Right popliteal tibial endarectomy with patch angioplasty;  Surgeon: Sherren Kerns, MD;  Location: [facility] OR;  Service: Vascular;  Laterality: Right;  . FASCIOTOMY CLOSURE Bilateral 08/28/2017   Procedure:  FASCIOTOMY CLOSURE/BILATERAL  lower legs;  Surgeon: Sherren Kerns, MD;  Location: [facility] OR;  Service: Vascular;  Laterality: Bilateral;  . FEMORAL-POPLITEAL BYPASS GRAFT Bilateral 08/26/2017   Procedure: Bilateral FEMORAL EMBOLECTOMY with  BILATERAL Four Compartment  FASCIOTOMIES.;  Surgeon: Sherren Kerns, MD;  Location: [facility] OR;  Service: Vascular;  Laterality: Bilateral;     Current Outpatient Medications  Medication Sig Dispense Refill  . aspirin 325 MG tablet Take 325 mg by mouth daily.    . metoprolol tartrate (LOPRESSOR) 25 MG tablet Take 25 mg by mouth daily.    Marland Kitchen warfarin (COUMADIN) 1 MG tablet Take 1.5 mg by mouth daily.    Marland Kitchen atorvastatin (LIPITOR) 20 MG tablet Take 1 tablet (20 mg total) by mouth daily. 90 tablet 3  . enoxaparin (LOVENOX) 80 MG/0.8ML injection Inject 0.8 mLs (80 mg total) into the skin every 12 (twelve) hours. 7 Syringe 0   No current facility-administered medications for this visit.     Allergies:   Patient has no known allergies.    Social History:  The patient  reports that he has never smoked. He has never used smokeless tobacco. He reports that he does not drink alcohol or use drugs.   Family History:  The patient's family history includes Stroke in his sister.    ROS: All other systems are reviewed and negative. Unless otherwise mentioned in H&P    PHYSICAL EXAM: VS:  BP 116/60  Pulse (!) 59   Ht 6\' 5"  (1.956 m)   Wt 186 lb (84.4 kg)   BMI 22.06 kg/m  , BMI Body mass index is 22.06 kg/m. GEN: Well nourished, well developed, in no acute distress HEENT: normal Neck: no JVD, carotid bruits, or masses Cardiac: RRR; no murmurs, rubs, or gallops,no edema  Respiratory:  Clear to auscultation bilaterally, normal work of breathing GI: soft, nontender, nondistended, + BS MS: no deformity or atrophy Skin: warm and dry, no rash Neuro:  Strength and sensation are intact, dysphasia  Psych: euthymic mood, full affect   EKG: NSR rate of 60 bpm    Recent Labs: 06/20/2018: B Natriuretic Peptide 164.0 01/22/2019: ALT 23; BUN 20; Creatinine, Ser 1.42; Hemoglobin 15.1; Platelets 213; Potassium 5.1; Sodium 138    Lipid Panel    Component Value Date/Time   CHOL 149 08/23/2017 0425   TRIG 102 08/23/2017 0425   HDL 51 08/23/2017 0425   CHOLHDL 2.9 08/23/2017 0425   VLDL 20 08/23/2017 0425   LDLCALC 78 08/23/2017 0425      Wt Readings from Last 3 Encounters:  01/22/19 186 lb (84.4 kg)  01/22/19 191 lb 2.2 oz (86.7 kg)  01/10/19 186 lb (84.4 kg)      Other studies Reviewed: Vascular Ultrasound 12/21/2018  Summary: Right: 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 Total occlusion noted in the superficial femoral artery. Total occlusion noted in the popliteal artery. Near to total occlusion of DFA.  Left: 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 Total occlusion noted in the deep femoral artery. Total occlusion noted in the superficial femoral artery. Total occlusion noted in the popliteal artery.   ASSESSMENT AND PLAN:  1.Pre-Operative Cardiac Evaluation: He comes today without complaints of chest pain. His only complaints are of LE pain.    Chart reviewed as part of pre-operative protocol coverage. Given past medical history and time since last visit, based on ACC/AHA guidelines, [name] [name] would be at acceptable risk for the planned procedure without further cardiovascular testing.  He has been given instructions by our pharmacist for holding coumadin and beginning Lovenox dosing.  Please see note from [facility] Pharm D.   2. Atrial fibrillation: He is currently in NSR. He is on Metoprolol XL 25 mg daily. He is on coumadin with bridging to Lovenox. Written instructions are given to the patient as well as verbal instructions.   3. Hypercholesterolemia: He has not been taking atorvastatin. He is restarted on 20 mg at HS.  He will need follow up Lipids and LFTs. LDL goal < 70.    Current  medicines are reviewed at length with the patient today.    Labs/ tests ordered today include: None  Bettey Mare. Liborio Nixon, ANP, AACC   01/22/2019 2:03 PM    [facility] [address] Office [phone] Fax [phone]

## 2019-01-22 ENCOUNTER — Encounter: Payer: Self-pay | Admitting: Adult Health

## 2019-01-22 ENCOUNTER — Other Ambulatory Visit: Payer: Self-pay

## 2019-01-22 ENCOUNTER — Telehealth: Payer: Self-pay | Admitting: Pharmacist Clinician (PhC)/ Clinical Pharmacy Specialist

## 2019-01-22 ENCOUNTER — Encounter (HOSPITAL_COMMUNITY): Payer: Self-pay

## 2019-01-22 ENCOUNTER — Ambulatory Visit: Payer: Medicare HMO | Admitting: Adult Health

## 2019-01-22 ENCOUNTER — Encounter (HOSPITAL_COMMUNITY)
Admission: RE | Admit: 2019-01-22 | Discharge: 2019-01-22 | Disposition: A | Discharge status: Home / Self Care | Payer: Medicare HMO | Source: Ambulatory Visit | Attending: Vascular Surgery | Admitting: Vascular Surgery

## 2019-01-22 VITALS — BP 116/60 | HR 59 | Ht 77.0 in | Wt 186.0 lb

## 2019-01-22 DIAGNOSIS — Z79899 Other long term (current) drug therapy: Secondary | ICD-10-CM | POA: Insufficient documentation

## 2019-01-22 DIAGNOSIS — I69320 Aphasia following cerebral infarction: Secondary | ICD-10-CM | POA: Insufficient documentation

## 2019-01-22 DIAGNOSIS — Z7901 Long term (current) use of anticoagulants: Secondary | ICD-10-CM | POA: Diagnosis not present

## 2019-01-22 DIAGNOSIS — E7800 Pure hypercholesterolemia, unspecified: Secondary | ICD-10-CM

## 2019-01-22 DIAGNOSIS — Z7982 Long term (current) use of aspirin: Secondary | ICD-10-CM | POA: Diagnosis not present

## 2019-01-22 DIAGNOSIS — Z01818 Encounter for other preprocedural examination: Secondary | ICD-10-CM | POA: Diagnosis not present

## 2019-01-22 DIAGNOSIS — N189 Chronic kidney disease, unspecified: Secondary | ICD-10-CM | POA: Insufficient documentation

## 2019-01-22 DIAGNOSIS — I69351 Hemiplegia and hemiparesis following cerebral infarction affecting right dominant side: Secondary | ICD-10-CM | POA: Diagnosis not present

## 2019-01-22 DIAGNOSIS — I739 Peripheral vascular disease, unspecified: Secondary | ICD-10-CM | POA: Insufficient documentation

## 2019-01-22 DIAGNOSIS — Z01811 Encounter for preprocedural respiratory examination: Secondary | ICD-10-CM

## 2019-01-22 DIAGNOSIS — I480 Paroxysmal atrial fibrillation: Secondary | ICD-10-CM

## 2019-01-22 DIAGNOSIS — Z01810 Encounter for preprocedural cardiovascular examination: Secondary | ICD-10-CM

## 2019-01-22 HISTORY — DX: Chronic kidney disease, unspecified: N18.9

## 2019-01-22 HISTORY — DX: Cardiac arrhythmia, unspecified: I49.9

## 2019-01-22 LAB — COMPREHENSIVE METABOLIC PANEL
ALT: 23 U/L (ref 0–44)
AST: 28 U/L (ref 15–41)
Albumin: 3.7 g/dL (ref 3.5–5.0)
Alkaline Phosphatase: 54 U/L (ref 38–126)
Anion gap: 11 (ref 5–15)
BUN: 20 mg/dL (ref 8–23)
CHLORIDE: 107 mmol/L (ref 98–111)
CO2: 20 mmol/L — ABNORMAL LOW (ref 22–32)
Calcium: 9.1 mg/dL (ref 8.9–10.3)
Creatinine, Ser: 1.42 mg/dL — ABNORMAL HIGH (ref 0.61–1.24)
GFR calc Af Amer: 58 mL/min — ABNORMAL LOW (ref >60)
GFR, EST NON AFRICAN AMERICAN: 50 mL/min — AB (ref >60)
Glucose, Bld: 98 mg/dL (ref 70–99)
Potassium: 5.1 mmol/L (ref 3.5–5.1)
Sodium: 138 mmol/L (ref 135–145)
Total Bilirubin: 1.2 mg/dL (ref 0.3–1.2)
Total Protein: 7.2 g/dL (ref 6.5–8.1)

## 2019-01-22 LAB — URINALYSIS, ROUTINE W REFLEX MICROSCOPIC
BILIRUBIN URINE: NEGATIVE
Glucose, UA: NEGATIVE mg/dL
Hgb urine dipstick: NEGATIVE
Ketones, ur: NEGATIVE mg/dL
Leukocytes, UA: NEGATIVE
Nitrite: NEGATIVE
Protein, ur: NEGATIVE mg/dL
Specific Gravity, Urine: 1.018 (ref 1.005–1.030)
pH: 7 (ref 5.0–8.0)

## 2019-01-22 LAB — SURGICAL PCR SCREEN
MRSA, PCR: NEGATIVE
Staphylococcus aureus: NEGATIVE

## 2019-01-22 LAB — BLOOD GAS, ARTERIAL
Acid-Base Excess: 0.8 mmol/L (ref 0.0–2.0)
Bicarbonate: 24.5 mmol/L (ref 20.0–28.0)
Drawn by: 449841
FIO2: 21
O2 Saturation: 94.8 %
Patient temperature: 98.6
pCO2 arterial: 36.7 mmHg (ref 32.0–48.0)
pH, Arterial: 7.44 (ref 7.350–7.450)
pO2, Arterial: 69.1 mmHg — ABNORMAL LOW (ref 83.0–108.0)

## 2019-01-22 LAB — CBC
HCT: 48.1 % (ref 39.0–52.0)
Hemoglobin: 15.1 g/dL (ref 13.0–17.0)
MCH: 30.1 pg (ref 26.0–34.0)
MCHC: 31.4 g/dL (ref 30.0–36.0)
MCV: 96 fL (ref 80.0–100.0)
PLATELETS: 213 10*3/uL (ref 150–400)
RBC: 5.01 MIL/uL (ref 4.22–5.81)
RDW: 14.1 % (ref 11.5–15.5)
WBC: 7.2 10*3/uL (ref 4.0–10.5)
nRBC: 0 % (ref 0.0–0.2)

## 2019-01-22 LAB — PREPARE RBC (CROSSMATCH)

## 2019-01-22 LAB — PROTIME-INR
INR: 2.38
Prothrombin Time: 25.7 seconds — ABNORMAL HIGH (ref 11.4–15.2)

## 2019-01-22 LAB — APTT: aPTT: 40 seconds — ABNORMAL HIGH (ref 24–36)

## 2019-01-22 MED ORDER — ATORVASTATIN CALCIUM 20 MG PO TABS: 20.0000 mg | ORAL_TABLET | Freq: Every day | ORAL | 3 refills | Status: DC

## 2019-01-22 MED ORDER — ENOXAPARIN SODIUM 80 MG/0.8ML ~~LOC~~ SOLN: 80.0000 mg | Freq: Two times a day (BID) | SUBCUTANEOUS | 0 refills | Status: DC

## 2019-01-22 NOTE — Telephone Encounter (Signed)
Patient reports will be hospitalized for 5-6 days post procedure.  Asked that he contact PCP for follow up on warfarin dosing as soon as discharged.  In office with a relative who assures they will contact pcp.

## 2019-01-22 NOTE — Patient Instructions (Addendum)
  Follow-Up: You will need a follow up appointment in 2 months.  You may see Rollene Rotunda, MD Joni Reining, DNP, AACC  or one of the following Advanced Practice Providers on your designated Care Team:  Joni Reining, DNP, AACC Theodore Demark, PA-C  PLEASE SIGN DPR FOR MARVIN HILL [phone]  Medication Instructions:  YOUR LOVENOX WILL BE AVAILABLE TOMORROW AND IS $3-MAKE SURE TO START Thursday  START ATORVASTATIN 20MG  AT NIGHT  Your physician recommends that you continue on your current medications as directed. Please refer to the Current Medication list given to you today. If you need a refill on your cardiac medications before your next appointment, please call your pharmacy. Labwork: When you have labs (blood work) and your tests are completely normal, you will receive your results ONLY by MyChart Message (if you have MyChart) -OR- A paper copy in the mail.  At [facility], you and your health needs are our priority.  As part of our continuing mission to provide you with exceptional heart care, we have created designated Provider Care Teams.  These Care Teams include your primary Cardiologist (physician) and Advanced Practice Providers (APPs -  Physician Assistants and Nurse Practitioners) who all work together to provide you with the care you need, when you need it.  Thank you for choosing [facility] at [facility]!!

## 2019-01-22 NOTE — Telephone Encounter (Signed)
Instructions for warfarin/enoxaparin around procedure   Feb 4: Last dose of Coumadin.  Feb 5: No Coumadin or Lovenox.  Feb 6: Inject Lovenox 80 mg in the fatty abdominal tissue at least 2 inches from the belly button twice a day about 12 hours apart, 8am and 8pm rotate sites. No Coumadin.  Feb 7: Inject Lovenox in the fatty tissue every 12 hours, 8am and 8pm. No Coumadin.  Feb 8: Inject Lovenox in the fatty tissue every 12 hours, 8am and 8pm. No Coumadin.  Feb 9: Inject Lovenox in the fatty tissue in the morning at 8 am (No PM dose). No Coumadin.  Feb 10: Procedure Day - No Lovenox - Resume Coumadin in the evening or as directed by doctor   Feb 11: Resume Lovenox inject in the fatty tissue every 12 hours and take Coumadin.  Feb 12: Inject Lovenox in the fatty tissue every 12 hours and take Coumadin.  Feb 13: Inject Lovenox in the fatty tissue every 12 hours and take Coumadin.  Feb 14: Inject Lovenox in the fatty tissue every 12 hours and take Coumadin.  Feb 15: Inject Lovenox in the fatty tissue every 12 hours and take Coumadin.  Feb 16: Coumadin appt to check INR.

## 2019-01-22 NOTE — Progress Notes (Signed)
PCP [name] MD-  Cardiologist - Dr.Hocherin  Chest x-ray -06-20-2018  EKG -12-03-2018  Stress Test -  ECHO -08-23-2017  Cardiac Cath -n/a   Sleep Study n/a-  CPAP - n/a  Fasting Blood Sugar - n/a Checks Blood Sugar n/a____ times a day  Blood Thinner Instructions:Stop coumadin today Aspirin Instructions continue ASA :  Anesthesia review: yes  Patient denies shortness of breath, fever, cough and chest pain at PAT appointment   Patient verbalized understanding of instructions that were given to them at the PAT appointment. Patient was also instructed that they will need to review over the PAT instructions again at home before surgery.  Pt. Has appointment at [facility] [place] [facility] today,called [place]  RpH. Who states she will call Rph. @ [place] office to coordinate Lovenox @ that time.   Pt. Understands what you are saying when speaking with him but is unable to speak,friend with pt. To help with history. Right sided weakness due to stroke can only walk short distances.  All communications needs to be done with [name].

## 2019-01-23 NOTE — Progress Notes (Signed)
Anesthesia Chart Review:  Case:  [id number] Date/Time:  01/28/19 0715   Procedures:      AORTA BIFEMORAL BYPASS GRAFT (Bilateral )     BILATERAL FEMORAL ENDARTERECTOMY (Bilateral )     BYPASS GRAFT BILATERAL  FEMORAL-POPLITEAL ARTERY (Bilateral )   Anesthesia type:  General   Pre-op diagnosis:  PERIPHERAL ARTERY DISEASE   [facility]  [facility] ROOM 11 / [facility] OR   Surgeon:  Sherren Kerns, MD      DISCUSSION: 68 yo male never smoker. 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  Follows with cardiology for atrial fib, tachy-brady syndrome, has refused PPM implant. He is on coumadin therapy and has been instructed on lovenox bridge by pharmacy.   Cardiac clearance 01/22/19 "Chart reviewed as part of pre-operative protocol coverage. Given past medical history and time since last visit, based on ACC/AHA guidelines, [name] [name] would be at acceptable risk for the planned procedure without further cardiovascular testing".  Anticipate he can proceed as planned barring acute status change.  VS: BP (!) 122/55   Pulse (!) 55   Temp 36.5 C (Oral)   Resp 16   Ht 6\' 5"  (1.956 m)   Wt 86.7 kg   SpO2 99%   BMI 22.67 kg/m   PROVIDERS: Toma Deiters, MD is PCP  Rollene Rotunda, Md is Cardiologist  LABS: Labs reviewed: Acceptable for surgery. Mildly increased creatinine c/w history. (all labs ordered are listed, but only abnormal results are displayed)  Labs Reviewed  APTT - Abnormal; Notable for the following components:      Result Value   aPTT 40 (*)    All other components within normal limits  BLOOD GAS, ARTERIAL - Abnormal; Notable for the following components:   pO2, Arterial 69.1 (*)    All other components within normal limits  COMPREHENSIVE METABOLIC PANEL - Abnormal; Notable for the following components:   CO2 20 (*)    Creatinine, Ser 1.42 (*)    GFR calc non Af Amer 50 (*)    GFR calc Af Amer 58 (*)    All other components  within normal limits  PROTIME-INR - Abnormal; Notable for the following components:   Prothrombin Time 25.7 (*)    All other components within normal limits  SURGICAL PCR SCREEN  CBC  URINALYSIS, ROUTINE W REFLEX MICROSCOPIC  PREPARE RBC (CROSSMATCH)  TYPE AND SCREEN     IMAGES: CTA 01/10/2019: IMPRESSION: Vascular:  No acute thoracic aortic pathology. There is no evidence of aortic aneurysm, dissection, or acute intramural hematoma within the thorax.  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 There is what is favored to be subacute thrombus in the aorta beginning at the level of the renal arteries.  Bilateral common iliac and external iliac artery occlusion.  Right common femoral and superficial femoral arteries are occluded to the level of the abductor canal. The distal right superficial femoral artery briefly reconstitutes but then becomes occluded again.  The right popliteal artery is also occluded. All 3 tibial vessels reconstitute in the proximal right leg.  The left common femoral artery is occluded. The superficial femoral artery reconstitutes at its origin. It is then occluded at the abductor canal.  The left popliteal artery is occluded. The posterior tibial artery reconstitutes and is patent to the distal leg.  No evidence of thrombus in the visceral vasculature.  Nonvascular:  Cholelithiasis.  Chronic changes.  EKG: 06/20/2018: Afib with rapid response. Ventricular  rate 156. Abnormal r wave progression, early transition. Repolarization abnormality, prob rate related.   CV: TTE 08/23/2017: Study Conclusions  - Left ventricle: The cavity size was normal. Wall thickness was   normal. Systolic function was normal. The estimated ejection   fraction was 55%. Wall motion was normal; there were no regional   wall motion abnormalities. Left ventricular diastolic function   parameters were normal. - Aortic valve: Trileaflet; mildly thickened  leaflets. There was   trivial regurgitation. - Aorta: Mild aortic root dilatation. Aortic root dimension: 39 mm   (ED). - Mitral valve: There was mild regurgitation. - Left atrium: The atrium was mildly dilated. - Atrial septum: No defect or patent foramen ovale was identified. - Pulmonic valve: There was mild regurgitation.  Carotid US 08/23/2017: RIGHT CAROTID ARTERY: No significant calcifications of the right common carotid artery. Intermediate waveform maintained. Heterogeneous and partially calcified plaque at the right carotid bifurcation. No significant lumen shadowing. Low resistance waveform of the right ICA. Mild tortuosity  RIGHT VERTEBRAL ARTERY: Antegrade flow with low resistance waveform.  LEFT CAROTID ARTERY: No significant calcifications of the left common carotid artery. Intermediate waveform maintained. Heterogeneous and partially calcified plaque at the left carotid bifurcation without significant lumen shadowing. Low resistance waveform of the left ICA. Duplex demonstrates color signal within the plaque of the far field wall at the bifurcation. Mild tortuosity  LEFT VERTEBRAL ARTERY:  Antegrade flow with low resistance waveform.  IMPRESSION: Right:  Color duplex indicates minimal heterogeneous and calcified plaque, with no hemodynamically significant stenosis by duplex criteria in the extracranial cerebrovascular circulation.  Left:  Color duplex indicates minimal heterogeneous and calcified plaque, with no hemodynamically significant stenosis by duplex criteria in the extracranial cerebrovascular circulation.  Color duplex demonstrates changes suggesting ulcerated plaque at the carotid bifurcation.  Past Medical History:  Diagnosis Date  . Afib (HCC)    a. Paroxysmal --> previous issues with bradycardia while on Amiodarone and he refused PPM placement. Rate-control strategy pursued.   . Chronic kidney disease    unknown problem to see kidney   doctor in March in [place]  . Dysrhythmia    Atril Fib  . Peripheral vascular disease (HCC)   . Stroke [facility])    Aphasia, right sided weakness     Past Surgical History:  Procedure Laterality Date  . EMBOLECTOMY Right 08/30/2017   Procedure: Right popliteal tibial endarectomy with patch angioplasty;  Surgeon: Sherren Kerns, MD;  Location: [facility] OR;  Service: Vascular;  Laterality: Right;  . FASCIOTOMY CLOSURE Bilateral 08/28/2017   Procedure: FASCIOTOMY CLOSURE/BILATERAL  lower legs;  Surgeon: Sherren Kerns, MD;  Location: [facility] OR;  Service: Vascular;  Laterality: Bilateral;  . FEMORAL-POPLITEAL BYPASS GRAFT Bilateral 08/26/2017   Procedure: Bilateral FEMORAL EMBOLECTOMY with  BILATERAL Four Compartment  FASCIOTOMIES.;  Surgeon: Sherren Kerns, MD;  Location: [facility] OR;  Service: Vascular;  Laterality: Bilateral;    MEDICATIONS: . aspirin 325 MG tablet  . atorvastatin (LIPITOR) 20 MG tablet  . enoxaparin (LOVENOX) 80 MG/0.8ML injection  . metoprolol tartrate (LOPRESSOR) 25 MG tablet  . warfarin (COUMADIN) 1 MG tablet   No current facility-administered medications for this encounter.     Zannie Cove [facility] Short Stay Center/Anesthesiology Phone [phone] 01/23/2019 1:02 PM

## 2019-01-23 NOTE — Anesthesia Preprocedure Evaluation (Addendum)
Anesthesia Evaluation  Patient identified by MRN, date of birth, ID band Patient awake    Reviewed: Allergy & Precautions, NPO status , Patient's Chart, lab work & pertinent test results, reviewed documented beta blocker date and time   History of Anesthesia Complications Negative for: history of anesthetic complications  Airway Mallampati: II  TM Distance: >3 FB Neck ROM: Full    Dental no notable dental hx. (+) Dental Advisory Given   Pulmonary neg pulmonary ROS,    Pulmonary exam normal        Cardiovascular hypertension, Pt. on home beta blockers + Peripheral Vascular Disease  Normal cardiovascular exam+ dysrhythmias Atrial Fibrillation   TTE 08/23/2017: Study Conclusions  - Left ventricle: The cavity size was normal. Wall thickness was normal. Systolic function was normal. The estimated ejection fraction was 55%. Wall motion was normal; there were no regional wall motion abnormalities. Left ventricular diastolic function parameters were normal. - Aortic valve: Trileaflet; mildly thickened leaflets. There was trivial regurgitation. - Aorta: Mild aortic root dilatation. Aortic root dimension: 39 mm (ED). - Mitral valve: There was mild regurgitation. - Left atrium: The atrium was mildly dilated. - Atrial septum: No defect or patent foramen ovale was identified. - Pulmonic valve: There was mild regurgitation.    Neuro/Psych CVA, Residual Symptoms    GI/Hepatic negative GI ROS, Neg liver ROS,   Endo/Other  negative endocrine ROS  Renal/GU Renal InsufficiencyRenal disease     Musculoskeletal negative musculoskeletal ROS (+)   Abdominal   Peds  Hematology negative hematology ROS (+)   Anesthesia Other Findings Day of surgery medications reviewed with the patient.  Reproductive/Obstetrics                                                            Anesthesia Evaluation   Patient identified by MRN, date of birth, ID band Patient awake    Reviewed: Allergy & Precautions, NPO status , Patient's Chart, lab work & pertinent test results  Airway Mallampati: II  TM Distance: >3 FB Neck ROM: Full    Dental no notable dental hx.    Pulmonary neg pulmonary ROS,    Pulmonary exam normal breath sounds clear to auscultation       Cardiovascular Normal cardiovascular exam+ dysrhythmias Atrial Fibrillation  Rhythm:Regular Rate:Normal     Neuro/Psych CVA (R sided weakness, aphasia), Residual Symptoms negative neurological ROS  negative psych ROS   GI/Hepatic negative GI ROS, Neg liver ROS,   Endo/Other  negative endocrine ROS  Renal/GU negative Renal ROS  negative genitourinary   Musculoskeletal negative musculoskeletal ROS (+)   Abdominal   Peds negative pediatric ROS (+)  Hematology negative hematology ROS (+)   Anesthesia Other Findings   Reproductive/Obstetrics negative OB ROS                             Anesthesia Physical Anesthesia Plan  ASA: III  Anesthesia Plan: General   Post-op Pain Management:    Induction: Intravenous  PONV Risk Score and Plan: 2 and Ondansetron and Dexamethasone  Airway Management Planned: Oral ETT  Additional Equipment:   Intra-op Plan:   Post-operative Plan: Extubation in OR  Informed Consent: I have reviewed the patients History and Physical, chart, labs and discussed the procedure  including the risks, benefits and alternatives for the proposed anesthesia with the patient or authorized representative who has indicated his/her understanding and acceptance.   Dental advisory given  Plan Discussed with: CRNA  Anesthesia Plan Comments:         Anesthesia Quick Evaluation                                   Anesthesia Evaluation  Patient identified by MRN, date of birth, ID band Patient awake    Reviewed: Allergy & Precautions, NPO status ,  Patient's Chart, lab work & pertinent test results  Airway Mallampati: II  TM Distance: >3 FB Neck ROM: Full    Dental no notable dental hx.    Pulmonary neg pulmonary ROS,    Pulmonary exam normal breath sounds clear to auscultation       Cardiovascular Normal cardiovascular exam+ dysrhythmias Atrial Fibrillation  Rhythm:Regular Rate:Normal     Neuro/Psych CVA (R sided weakness, aphasia), Residual Symptoms negative neurological ROS  negative psych ROS   GI/Hepatic negative GI ROS, Neg liver ROS,   Endo/Other  negative endocrine ROS  Renal/GU negative Renal ROS  negative genitourinary   Musculoskeletal negative musculoskeletal ROS (+)   Abdominal   Peds negative pediatric ROS (+)  Hematology negative hematology ROS (+)   Anesthesia Other Findings   Reproductive/Obstetrics negative OB ROS                             Anesthesia Physical Anesthesia Plan  ASA: III  Anesthesia Plan: General   Post-op Pain Management:    Induction: Intravenous  PONV Risk Score and Plan: 2 and Ondansetron and Dexamethasone  Airway Management Planned: Oral ETT  Additional Equipment:   Intra-op Plan:   Post-operative Plan: Extubation in OR  Informed Consent: I have reviewed the patients History and Physical, chart, labs and discussed the procedure including the risks, benefits and alternatives for the proposed anesthesia with the patient or authorized representative who has indicated his/her understanding and acceptance.   Dental advisory given  Plan Discussed with: CRNA  Anesthesia Plan Comments:         Anesthesia Quick Evaluation  Anesthesia Physical Anesthesia Plan  ASA: III  Anesthesia Plan: General   Post-op Pain Management:    Induction: Intravenous  PONV Risk Score and Plan: 4 or greater and Ondansetron, Dexamethasone, Scopolamine patch - Pre-op and Diphenhydramine  Airway Management Planned: Oral  ETT  Additional Equipment: Arterial line and CVP  Intra-op Plan:   Post-operative Plan: Post-operative intubation/ventilation  Informed Consent: I have reviewed the patients History and Physical, chart, labs and discussed the procedure including the risks, benefits and alternatives for the proposed anesthesia with the patient or authorized representative who has indicated his/her understanding and acceptance.     Dental advisory given  Plan Discussed with: CRNA and Anesthesiologist  Anesthesia Plan Comments: (See PAT note by Antionette PolesJames Burns, PA-C )      Anesthesia Quick Evaluation

## 2019-01-28 ENCOUNTER — Encounter (HOSPITAL_COMMUNITY)
Admission: RE | Disposition: A | Discharge status: Home Health | Payer: Self-pay | Source: Home / Self Care | Attending: Vascular Surgery

## 2019-01-28 ENCOUNTER — Inpatient Hospital Stay (HOSPITAL_COMMUNITY): Payer: Medicare HMO | Admitting: Physician Assistant

## 2019-01-28 ENCOUNTER — Inpatient Hospital Stay (HOSPITAL_COMMUNITY)
Admission: RE | Admit: 2019-01-28 | Discharge: 2019-02-28 | DRG: 270 | Disposition: A | Discharge status: Home Health | Payer: Medicare HMO | Attending: Vascular Surgery | Admitting: Vascular Surgery

## 2019-01-28 ENCOUNTER — Other Ambulatory Visit: Payer: Self-pay

## 2019-01-28 ENCOUNTER — Inpatient Hospital Stay (HOSPITAL_COMMUNITY): Payer: Medicare HMO

## 2019-01-28 ENCOUNTER — Inpatient Hospital Stay (HOSPITAL_COMMUNITY): Payer: Medicare HMO | Admitting: Certified Registered"

## 2019-01-28 ENCOUNTER — Encounter (HOSPITAL_COMMUNITY): Payer: Self-pay

## 2019-01-28 DIAGNOSIS — D62 Acute posthemorrhagic anemia: Secondary | ICD-10-CM | POA: Diagnosis not present

## 2019-01-28 DIAGNOSIS — E869 Volume depletion, unspecified: Secondary | ICD-10-CM | POA: Diagnosis present

## 2019-01-28 DIAGNOSIS — N170 Acute kidney failure with tubular necrosis: Secondary | ICD-10-CM | POA: Diagnosis not present

## 2019-01-28 DIAGNOSIS — I495 Sick sinus syndrome: Secondary | ICD-10-CM

## 2019-01-28 DIAGNOSIS — K661 Hemoperitoneum: Secondary | ICD-10-CM | POA: Diagnosis not present

## 2019-01-28 DIAGNOSIS — I361 Nonrheumatic tricuspid (valve) insufficiency: Secondary | ICD-10-CM | POA: Diagnosis not present

## 2019-01-28 DIAGNOSIS — R7881 Bacteremia: Secondary | ICD-10-CM | POA: Diagnosis not present

## 2019-01-28 DIAGNOSIS — E440 Moderate protein-calorie malnutrition: Secondary | ICD-10-CM | POA: Diagnosis present

## 2019-01-28 DIAGNOSIS — I483 Typical atrial flutter: Secondary | ICD-10-CM | POA: Diagnosis not present

## 2019-01-28 DIAGNOSIS — I69328 Other speech and language deficits following cerebral infarction: Secondary | ICD-10-CM

## 2019-01-28 DIAGNOSIS — R600 Localized edema: Secondary | ICD-10-CM | POA: Diagnosis present

## 2019-01-28 DIAGNOSIS — R918 Other nonspecific abnormal finding of lung field: Secondary | ICD-10-CM | POA: Diagnosis not present

## 2019-01-28 DIAGNOSIS — Z7901 Long term (current) use of anticoagulants: Secondary | ICD-10-CM

## 2019-01-28 DIAGNOSIS — I480 Paroxysmal atrial fibrillation: Secondary | ICD-10-CM | POA: Diagnosis not present

## 2019-01-28 DIAGNOSIS — Z09 Encounter for follow-up examination after completed treatment for conditions other than malignant neoplasm: Secondary | ICD-10-CM

## 2019-01-28 DIAGNOSIS — R0600 Dyspnea, unspecified: Secondary | ICD-10-CM | POA: Diagnosis present

## 2019-01-28 DIAGNOSIS — Z0189 Encounter for other specified special examinations: Secondary | ICD-10-CM

## 2019-01-28 DIAGNOSIS — R109 Unspecified abdominal pain: Secondary | ICD-10-CM | POA: Diagnosis not present

## 2019-01-28 DIAGNOSIS — I9589 Other hypotension: Secondary | ICD-10-CM | POA: Diagnosis present

## 2019-01-28 DIAGNOSIS — I4819 Other persistent atrial fibrillation: Secondary | ICD-10-CM | POA: Diagnosis not present

## 2019-01-28 DIAGNOSIS — D649 Anemia, unspecified: Secondary | ICD-10-CM | POA: Diagnosis not present

## 2019-01-28 DIAGNOSIS — Z452 Encounter for adjustment and management of vascular access device: Secondary | ICD-10-CM

## 2019-01-28 DIAGNOSIS — E871 Hypo-osmolality and hyponatremia: Secondary | ICD-10-CM | POA: Diagnosis present

## 2019-01-28 DIAGNOSIS — I6930 Unspecified sequelae of cerebral infarction: Secondary | ICD-10-CM | POA: Diagnosis not present

## 2019-01-28 DIAGNOSIS — I7409 Other arterial embolism and thrombosis of abdominal aorta: Secondary | ICD-10-CM | POA: Insufficient documentation

## 2019-01-28 DIAGNOSIS — E872 Acidosis: Secondary | ICD-10-CM | POA: Diagnosis not present

## 2019-01-28 DIAGNOSIS — Z9889 Other specified postprocedural states: Secondary | ICD-10-CM | POA: Diagnosis not present

## 2019-01-28 DIAGNOSIS — Z79899 Other long term (current) drug therapy: Secondary | ICD-10-CM

## 2019-01-28 DIAGNOSIS — I350 Nonrheumatic aortic (valve) stenosis: Secondary | ICD-10-CM | POA: Diagnosis not present

## 2019-01-28 DIAGNOSIS — I4892 Unspecified atrial flutter: Secondary | ICD-10-CM | POA: Diagnosis present

## 2019-01-28 DIAGNOSIS — K567 Ileus, unspecified: Secondary | ICD-10-CM

## 2019-01-28 DIAGNOSIS — Z7982 Long term (current) use of aspirin: Secondary | ICD-10-CM

## 2019-01-28 DIAGNOSIS — R509 Fever, unspecified: Secondary | ICD-10-CM

## 2019-01-28 DIAGNOSIS — Z978 Presence of other specified devices: Secondary | ICD-10-CM

## 2019-01-28 DIAGNOSIS — N189 Chronic kidney disease, unspecified: Secondary | ICD-10-CM | POA: Diagnosis present

## 2019-01-28 DIAGNOSIS — N390 Urinary tract infection, site not specified: Secondary | ICD-10-CM | POA: Diagnosis not present

## 2019-01-28 DIAGNOSIS — I959 Hypotension, unspecified: Secondary | ICD-10-CM | POA: Diagnosis not present

## 2019-01-28 DIAGNOSIS — I743 Embolism and thrombosis of arteries of the lower extremities: Secondary | ICD-10-CM | POA: Diagnosis present

## 2019-01-28 DIAGNOSIS — R1110 Vomiting, unspecified: Secondary | ICD-10-CM

## 2019-01-28 DIAGNOSIS — E861 Hypovolemia: Secondary | ICD-10-CM

## 2019-01-28 DIAGNOSIS — Z823 Family history of stroke: Secondary | ICD-10-CM

## 2019-01-28 DIAGNOSIS — I70223 Atherosclerosis of native arteries of extremities with rest pain, bilateral legs: Secondary | ICD-10-CM | POA: Diagnosis not present

## 2019-01-28 DIAGNOSIS — Z7902 Long term (current) use of antithrombotics/antiplatelets: Secondary | ICD-10-CM

## 2019-01-28 DIAGNOSIS — N179 Acute kidney failure, unspecified: Secondary | ICD-10-CM | POA: Diagnosis present

## 2019-01-28 DIAGNOSIS — R140 Abdominal distension (gaseous): Secondary | ICD-10-CM

## 2019-01-28 DIAGNOSIS — B9620 Unspecified Escherichia coli [E. coli] as the cause of diseases classified elsewhere: Secondary | ICD-10-CM | POA: Diagnosis not present

## 2019-01-28 DIAGNOSIS — I739 Peripheral vascular disease, unspecified: Secondary | ICD-10-CM | POA: Diagnosis not present

## 2019-01-28 DIAGNOSIS — I7410 Embolism and thrombosis of unspecified parts of aorta: Secondary | ICD-10-CM | POA: Diagnosis not present

## 2019-01-28 DIAGNOSIS — K5939 Other megacolon: Secondary | ICD-10-CM | POA: Diagnosis not present

## 2019-01-28 DIAGNOSIS — R0989 Other specified symptoms and signs involving the circulatory and respiratory systems: Secondary | ICD-10-CM

## 2019-01-28 DIAGNOSIS — K9189 Other postprocedural complications and disorders of digestive system: Secondary | ICD-10-CM | POA: Diagnosis not present

## 2019-01-28 DIAGNOSIS — Z4682 Encounter for fitting and adjustment of non-vascular catheter: Secondary | ICD-10-CM | POA: Diagnosis not present

## 2019-01-28 DIAGNOSIS — R188 Other ascites: Secondary | ICD-10-CM | POA: Diagnosis not present

## 2019-01-28 DIAGNOSIS — J9811 Atelectasis: Secondary | ICD-10-CM | POA: Diagnosis not present

## 2019-01-28 DIAGNOSIS — K56609 Unspecified intestinal obstruction, unspecified as to partial versus complete obstruction: Secondary | ICD-10-CM

## 2019-01-28 DIAGNOSIS — Z95828 Presence of other vascular implants and grafts: Secondary | ICD-10-CM

## 2019-01-28 HISTORY — PX: OTHER SURGICAL HISTORY: SHX169

## 2019-01-28 HISTORY — DX: Nonrheumatic aortic (valve) stenosis: I35.0

## 2019-01-28 HISTORY — PX: ENDARTERECTOMY FEMORAL: SHX5804

## 2019-01-28 HISTORY — PX: FEMORAL-POPLITEAL BYPASS GRAFT: SHX937

## 2019-01-28 HISTORY — PX: AORTA - BILATERAL FEMORAL ARTERY BYPASS GRAFT: SHX1175

## 2019-01-28 LAB — CBC
HEMATOCRIT: 42.8 % (ref 39.0–52.0)
Hemoglobin: 13.7 g/dL (ref 13.0–17.0)
MCH: 30 pg (ref 26.0–34.0)
MCHC: 32 g/dL (ref 30.0–36.0)
MCV: 93.7 fL (ref 80.0–100.0)
Platelets: 153 10*3/uL (ref 150–400)
RBC: 4.57 MIL/uL (ref 4.22–5.81)
RDW: 13.8 % (ref 11.5–15.5)
WBC: 19.5 10*3/uL — AB (ref 4.0–10.5)
nRBC: 0 % (ref 0.0–0.2)

## 2019-01-28 LAB — BASIC METABOLIC PANEL
Anion gap: 8 (ref 5–15)
BUN: 14 mg/dL (ref 8–23)
CO2: 21 mmol/L — ABNORMAL LOW (ref 22–32)
Calcium: 8.6 mg/dL — ABNORMAL LOW (ref 8.9–10.3)
Chloride: 109 mmol/L (ref 98–111)
Creatinine, Ser: 1.38 mg/dL — ABNORMAL HIGH (ref 0.61–1.24)
GFR calc Af Amer: >60 mL/min (ref >60)
GFR calc non Af Amer: 52 mL/min — ABNORMAL LOW (ref >60)
Glucose, Bld: 171 mg/dL — ABNORMAL HIGH (ref 70–99)
Potassium: 4.8 mmol/L (ref 3.5–5.1)
Sodium: 138 mmol/L (ref 135–145)

## 2019-01-28 LAB — POCT I-STAT 7, (LYTES, BLD GAS, ICA,H+H)
ACID-BASE DEFICIT: 1 mmol/L (ref 0.0–2.0)
Acid-base deficit: 2 mmol/L (ref 0.0–2.0)
Bicarbonate: 24.5 mmol/L (ref 20.0–28.0)
Bicarbonate: 23.9 mmol/L (ref 20.0–28.0)
Bicarbonate: 25.7 mmol/L (ref 20.0–28.0)
Calcium, Ion: 1.18 mmol/L (ref 1.15–1.40)
Calcium, Ion: 1.07 mmol/L — ABNORMAL LOW (ref 1.15–1.40)
Calcium, Ion: 1.11 mmol/L — ABNORMAL LOW (ref 1.15–1.40)
HCT: 38 % — ABNORMAL LOW (ref 39.0–52.0)
HEMATOCRIT: 39 % (ref 39.0–52.0)
HEMATOCRIT: 37 % — AB (ref 39.0–52.0)
HEMOGLOBIN: 12.6 g/dL — AB (ref 13.0–17.0)
Hemoglobin: 13.3 g/dL (ref 13.0–17.0)
Hemoglobin: 12.9 g/dL — ABNORMAL LOW (ref 13.0–17.0)
O2 Saturation: 100 %
O2 Saturation: 100 %
O2 Saturation: 99 %
PCO2 ART: 45.4 mmHg (ref 32.0–48.0)
PH ART: 7.396 (ref 7.350–7.450)
POTASSIUM: 4.4 mmol/L (ref 3.5–5.1)
Patient temperature: 35.2
Potassium: 4.4 mmol/L (ref 3.5–5.1)
Potassium: 4.2 mmol/L (ref 3.5–5.1)
Sodium: 136 mmol/L (ref 135–145)
Sodium: 138 mmol/L (ref 135–145)
Sodium: 138 mmol/L (ref 135–145)
TCO2: 26 mmol/L (ref 22–32)
TCO2: 25 mmol/L (ref 22–32)
TCO2: 27 mmol/L (ref 22–32)
pCO2 arterial: 39.2 mmHg (ref 32.0–48.0)
pCO2 arterial: 42.2 mmHg (ref 32.0–48.0)
pH, Arterial: 7.355 (ref 7.350–7.450)
pH, Arterial: 7.361 (ref 7.350–7.450)
pO2, Arterial: 235 mmHg — ABNORMAL HIGH (ref 83.0–108.0)
pO2, Arterial: 167 mmHg — ABNORMAL HIGH (ref 83.0–108.0)
pO2, Arterial: 189 mmHg — ABNORMAL HIGH (ref 83.0–108.0)

## 2019-01-28 LAB — POCT ACTIVATED CLOTTING TIME: Activated Clotting Time: 279 seconds

## 2019-01-28 LAB — APTT
aPTT: 29 seconds (ref 24–36)
aPTT: 34 seconds (ref 24–36)

## 2019-01-28 LAB — PROTIME-INR
INR: 1.16
Prothrombin Time: 14.7 seconds (ref 11.4–15.2)

## 2019-01-28 LAB — MAGNESIUM: Magnesium: 1.7 mg/dL (ref 1.7–2.4)

## 2019-01-28 SURGERY — CREATION, BYPASS, ARTERIAL, AORTA TO FEMORAL, BILATERAL, USING GRAFT
Anesthesia: General | Site: Groin | Laterality: Bilateral

## 2019-01-28 MED ORDER — LABETALOL HCL 5 MG/ML IV SOLN: 10.0000 mg | INTRAVENOUS | Status: DC | PRN

## 2019-01-28 MED ORDER — ACETAMINOPHEN 500 MG PO TABS
1000.0000 mg | ORAL_TABLET | Freq: Once | ORAL | Status: AC
  Administered 2019-01-28: 1000 mg via ORAL
  Filled 2019-01-28: qty 2

## 2019-01-28 MED ORDER — PROMETHAZINE HCL 25 MG/ML IJ SOLN: 6.2500 mg | INTRAMUSCULAR | Status: DC | PRN

## 2019-01-28 MED ORDER — FENTANYL CITRATE (PF) 250 MCG/5ML IJ SOLN
INTRAMUSCULAR | Status: AC
  Filled 2019-01-28: qty 5

## 2019-01-28 MED ORDER — CHLORHEXIDINE GLUCONATE 4 % EX LIQD: 60.0000 mL | Freq: Once | CUTANEOUS | Status: DC

## 2019-01-28 MED ORDER — ROCURONIUM BROMIDE 50 MG/5ML IV SOSY
PREFILLED_SYRINGE | INTRAVENOUS | Status: AC
  Filled 2019-01-28: qty 5

## 2019-01-28 MED ORDER — KETAMINE HCL 10 MG/ML IJ SOLN
INTRAMUSCULAR | Status: DC | PRN
  Administered 2019-01-28: 20 mg via INTRAVENOUS
  Administered 2019-01-28 (×3): 10 mg via INTRAVENOUS

## 2019-01-28 MED ORDER — GLYCOPYRROLATE 0.2 MG/ML IJ SOLN
INTRAMUSCULAR | Status: DC | PRN
  Administered 2019-01-28: 0.1 mg via INTRAVENOUS

## 2019-01-28 MED ORDER — FENTANYL CITRATE (PF) 100 MCG/2ML IJ SOLN
25.0000 ug | INTRAMUSCULAR | Status: DC | PRN
  Administered 2019-01-28 (×2): 50 ug via INTRAVENOUS

## 2019-01-28 MED ORDER — LIDOCAINE 2% (20 MG/ML) 5 ML SYRINGE
INTRAMUSCULAR | Status: AC
  Filled 2019-01-28: qty 5

## 2019-01-28 MED ORDER — PHENOL 1.4 % MT LIQD: 1.0000 | OROMUCOSAL | Status: DC | PRN

## 2019-01-28 MED ORDER — ORAL CARE MOUTH RINSE: 15.0000 mL | Freq: Two times a day (BID) | OROMUCOSAL | Status: DC

## 2019-01-28 MED ORDER — SODIUM CHLORIDE 0.9 % IV SOLN
INTRAVENOUS | Status: AC
  Filled 2019-01-28: qty 1.2

## 2019-01-28 MED ORDER — LIDOCAINE 2% (20 MG/ML) 5 ML SYRINGE
INTRAMUSCULAR | Status: DC | PRN
  Administered 2019-01-28: 100 mg via INTRAVENOUS

## 2019-01-28 MED ORDER — POTASSIUM CHLORIDE CRYS ER 20 MEQ PO TBCR: 20.0000 meq | EXTENDED_RELEASE_TABLET | Freq: Every day | ORAL | Status: DC | PRN

## 2019-01-28 MED ORDER — DIPHENHYDRAMINE HCL 50 MG/ML IJ SOLN
INTRAMUSCULAR | Status: DC | PRN
  Administered 2019-01-28: 25 mg via INTRAVENOUS

## 2019-01-28 MED ORDER — MANNITOL 25 % IV SOLN
INTRAVENOUS | Status: DC | PRN
  Administered 2019-01-28: 25 g via INTRAVENOUS

## 2019-01-28 MED ORDER — EPHEDRINE SULFATE-NACL 50-0.9 MG/10ML-% IV SOSY
PREFILLED_SYRINGE | INTRAVENOUS | Status: DC | PRN
  Administered 2019-01-28: 5 mg via INTRAVENOUS
  Administered 2019-01-28: 10 mg via INTRAVENOUS

## 2019-01-28 MED ORDER — CHLORHEXIDINE GLUCONATE 0.12 % MT SOLN
15.0000 mL | Freq: Two times a day (BID) | OROMUCOSAL | Status: DC
  Administered 2019-01-28: 15 mL via OROMUCOSAL

## 2019-01-28 MED ORDER — CALCIUM CHLORIDE 10 % IV SOLN
INTRAVENOUS | Status: DC | PRN
  Administered 2019-01-28 (×5): 100 mg via INTRAVENOUS

## 2019-01-28 MED ORDER — PROTAMINE SULFATE 10 MG/ML IV SOLN
INTRAVENOUS | Status: AC
  Filled 2019-01-28: qty 25

## 2019-01-28 MED ORDER — 0.9 % SODIUM CHLORIDE (POUR BTL) OPTIME
TOPICAL | Status: DC | PRN
  Administered 2019-01-28: 3000 mL

## 2019-01-28 MED ORDER — MIDAZOLAM HCL 2 MG/2ML IJ SOLN
INTRAMUSCULAR | Status: AC
  Filled 2019-01-28: qty 2

## 2019-01-28 MED ORDER — SODIUM CHLORIDE 0.9 % IV SOLN
INTRAVENOUS | Status: DC | PRN
  Administered 2019-01-28: 500 mL

## 2019-01-28 MED ORDER — SODIUM CHLORIDE 0.9 % IV SOLN
500.0000 mL | Freq: Once | INTRAVENOUS | Status: AC | PRN
  Administered 2019-02-01: 500 mL via INTRAVENOUS

## 2019-01-28 MED ORDER — ENOXAPARIN SODIUM 100 MG/ML ~~LOC~~ SOLN
1.0000 mg/kg | Freq: Two times a day (BID) | SUBCUTANEOUS | Status: DC
  Filled 2019-01-28: qty 0.85

## 2019-01-28 MED ORDER — DIPHENHYDRAMINE HCL 50 MG/ML IJ SOLN
INTRAMUSCULAR | Status: AC
  Filled 2019-01-28: qty 1

## 2019-01-28 MED ORDER — MIDAZOLAM HCL 5 MG/5ML IJ SOLN
INTRAMUSCULAR | Status: DC | PRN
  Administered 2019-01-28 (×2): 1 mg via INTRAVENOUS

## 2019-01-28 MED ORDER — ALUM & MAG HYDROXIDE-SIMETH 200-200-20 MG/5ML PO SUSP: 15.0000 mL | ORAL | Status: DC | PRN

## 2019-01-28 MED ORDER — HEPARIN SODIUM (PORCINE) 1000 UNIT/ML IJ SOLN
INTRAMUSCULAR | Status: DC | PRN
  Administered 2019-01-28 (×2): 9000 [IU] via INTRAVENOUS
  Administered 2019-01-28: 5000 [IU] via INTRAVENOUS

## 2019-01-28 MED ORDER — HYDROMORPHONE HCL 1 MG/ML IJ SOLN
0.5000 mg | INTRAMUSCULAR | Status: DC | PRN
  Administered 2019-02-03: 0.5 mg via INTRAVENOUS
  Filled 2019-01-28: qty 1
  Filled 2019-01-28: qty 0.5
  Filled 2019-01-28: qty 1

## 2019-01-28 MED ORDER — METOPROLOL TARTRATE 5 MG/5ML IV SOLN
2.0000 mg | INTRAVENOUS | Status: AC | PRN
Start: 2019-01-28 — End: 2019-01-29
  Administered 2019-01-29 (×2): 5 mg via INTRAVENOUS
  Filled 2019-01-28 (×3): qty 5

## 2019-01-28 MED ORDER — SUGAMMADEX SODIUM 200 MG/2ML IV SOLN
INTRAVENOUS | Status: DC | PRN
  Administered 2019-01-28: 200 mg via INTRAVENOUS

## 2019-01-28 MED ORDER — HYDRALAZINE HCL 20 MG/ML IJ SOLN: 5.0000 mg | INTRAMUSCULAR | Status: DC | PRN

## 2019-01-28 MED ORDER — DOCUSATE SODIUM 100 MG PO CAPS
100.0000 mg | ORAL_CAPSULE | Freq: Every day | ORAL | Status: DC
  Administered 2019-01-29 – 2019-01-31 (×3): 100 mg via ORAL
  Filled 2019-01-28 (×3): qty 1

## 2019-01-28 MED ORDER — CEFAZOLIN SODIUM-DEXTROSE 2-4 GM/100ML-% IV SOLN
2.0000 g | INTRAVENOUS | Status: AC
  Administered 2019-01-28 (×2): 2 g via INTRAVENOUS
  Filled 2019-01-28: qty 100

## 2019-01-28 MED ORDER — ROCURONIUM BROMIDE 50 MG/5ML IV SOSY
PREFILLED_SYRINGE | INTRAVENOUS | Status: DC | PRN
  Administered 2019-01-28: 50 mg via INTRAVENOUS
  Administered 2019-01-28 (×2): 100 mg via INTRAVENOUS
  Administered 2019-01-28: 50 mg via INTRAVENOUS

## 2019-01-28 MED ORDER — SODIUM CHLORIDE 0.9 % IV SOLN
INTRAVENOUS | Status: DC
  Administered 2019-01-28 (×2): via INTRAVENOUS

## 2019-01-28 MED ORDER — ONDANSETRON HCL 4 MG/2ML IJ SOLN
4.0000 mg | Freq: Four times a day (QID) | INTRAMUSCULAR | Status: DC | PRN
  Administered 2019-01-31 – 2019-02-06 (×3): 4 mg via INTRAVENOUS
  Filled 2019-01-28 (×4): qty 2

## 2019-01-28 MED ORDER — CEFAZOLIN SODIUM-DEXTROSE 2-4 GM/100ML-% IV SOLN
2.0000 g | Freq: Three times a day (TID) | INTRAVENOUS | Status: AC
  Administered 2019-01-28 – 2019-01-29 (×2): 2 g via INTRAVENOUS
  Filled 2019-01-28 (×2): qty 100

## 2019-01-28 MED ORDER — FENTANYL CITRATE (PF) 100 MCG/2ML IJ SOLN
INTRAMUSCULAR | Status: AC
  Administered 2019-01-28: 50 ug via INTRAVENOUS
  Filled 2019-01-28: qty 2

## 2019-01-28 MED ORDER — SODIUM CHLORIDE 0.9 % IV SOLN
INTRAVENOUS | Status: DC | PRN
  Administered 2019-01-28: 30 ug/min via INTRAVENOUS

## 2019-01-28 MED ORDER — KETAMINE HCL 50 MG/5ML IJ SOSY
PREFILLED_SYRINGE | INTRAMUSCULAR | Status: AC
  Filled 2019-01-28: qty 5

## 2019-01-28 MED ORDER — HEPARIN SODIUM (PORCINE) 1000 UNIT/ML IJ SOLN
INTRAMUSCULAR | Status: AC
  Filled 2019-01-28: qty 2

## 2019-01-28 MED ORDER — ALBUMIN HUMAN 5 % IV SOLN
INTRAVENOUS | Status: DC | PRN
  Administered 2019-01-28 (×4): via INTRAVENOUS

## 2019-01-28 MED ORDER — GUAIFENESIN-DM 100-10 MG/5ML PO SYRP: 15.0000 mL | ORAL_SOLUTION | ORAL | Status: DC | PRN

## 2019-01-28 MED ORDER — BISACODYL 10 MG RE SUPP
10.0000 mg | Freq: Every day | RECTAL | Status: DC | PRN
  Filled 2019-01-28: qty 1

## 2019-01-28 MED ORDER — PROPOFOL 10 MG/ML IV BOLUS
INTRAVENOUS | Status: DC | PRN
  Administered 2019-01-28: 200 mg via INTRAVENOUS

## 2019-01-28 MED ORDER — ACETAMINOPHEN 325 MG RE SUPP
325.0000 mg | RECTAL | Status: DC | PRN
  Administered 2019-02-13: 325 mg via RECTAL
  Administered 2019-02-13 (×2): 650 mg via RECTAL
  Filled 2019-01-28: qty 1
  Filled 2019-01-28: qty 2
  Filled 2019-01-28: qty 1
  Filled 2019-01-28: qty 2

## 2019-01-28 MED ORDER — PANTOPRAZOLE SODIUM 40 MG IV SOLR
40.0000 mg | Freq: Every day | INTRAVENOUS | Status: DC
  Administered 2019-01-28 – 2019-02-01 (×5): 40 mg via INTRAVENOUS
  Filled 2019-01-28 (×5): qty 40

## 2019-01-28 MED ORDER — DEXAMETHASONE SODIUM PHOSPHATE 10 MG/ML IJ SOLN
INTRAMUSCULAR | Status: DC | PRN
  Administered 2019-01-28: 10 mg via INTRAVENOUS

## 2019-01-28 MED ORDER — SODIUM CHLORIDE 0.9 % IV SOLN
INTRAVENOUS | Status: DC
  Administered 2019-01-28 – 2019-01-30 (×4): via INTRAVENOUS

## 2019-01-28 MED ORDER — FENTANYL CITRATE (PF) 100 MCG/2ML IJ SOLN
INTRAMUSCULAR | Status: DC | PRN
  Administered 2019-01-28: 50 ug via INTRAVENOUS
  Administered 2019-01-28 (×2): 25 ug via INTRAVENOUS
  Administered 2019-01-28: 50 ug via INTRAVENOUS
  Administered 2019-01-28: 25 ug via INTRAVENOUS
  Administered 2019-01-28: 50 ug via INTRAVENOUS
  Administered 2019-01-28: 25 ug via INTRAVENOUS
  Administered 2019-01-28: 100 ug via INTRAVENOUS
  Administered 2019-01-28: 50 ug via INTRAVENOUS
  Administered 2019-01-28: 25 ug via INTRAVENOUS

## 2019-01-28 MED ORDER — ONDANSETRON HCL 4 MG/2ML IJ SOLN
INTRAMUSCULAR | Status: AC
  Filled 2019-01-28: qty 2

## 2019-01-28 MED ORDER — MAGNESIUM SULFATE 2 GM/50ML IV SOLN
2.0000 g | Freq: Every day | INTRAVENOUS | Status: AC | PRN
  Administered 2019-01-29: 2 g via INTRAVENOUS
  Filled 2019-01-28: qty 50

## 2019-01-28 MED ORDER — ONDANSETRON HCL 4 MG/2ML IJ SOLN
INTRAMUSCULAR | Status: DC | PRN
  Administered 2019-01-28: 4 mg via INTRAVENOUS

## 2019-01-28 MED ORDER — LACTATED RINGERS IV SOLN
INTRAVENOUS | Status: DC | PRN
  Administered 2019-01-28 (×4): via INTRAVENOUS

## 2019-01-28 MED ORDER — ACETAMINOPHEN 325 MG PO TABS: 325.0000 mg | ORAL_TABLET | ORAL | Status: DC | PRN

## 2019-01-28 MED ORDER — PROPOFOL 10 MG/ML IV BOLUS
INTRAVENOUS | Status: AC
  Filled 2019-01-28: qty 20

## 2019-01-28 MED ORDER — DEXAMETHASONE SODIUM PHOSPHATE 10 MG/ML IJ SOLN
INTRAMUSCULAR | Status: AC
  Filled 2019-01-28: qty 1

## 2019-01-28 MED ORDER — PANTOPRAZOLE SODIUM 40 MG PO TBEC: 40.0000 mg | DELAYED_RELEASE_TABLET | Freq: Every day | ORAL | Status: DC

## 2019-01-28 MED ORDER — PROTAMINE SULFATE 10 MG/ML IV SOLN
INTRAVENOUS | Status: DC | PRN
  Administered 2019-01-28: 50 mg via INTRAVENOUS

## 2019-01-28 MED ORDER — KCL IN DEXTROSE-NACL 20-5-0.45 MEQ/L-%-% IV SOLN
INTRAVENOUS | Status: DC
  Filled 2019-01-28: qty 1000

## 2019-01-28 MED ORDER — SCOPOLAMINE 1 MG/3DAYS TD PT72
1.0000 | MEDICATED_PATCH | TRANSDERMAL | Status: DC
  Administered 2019-01-28: 1.5 mg via TRANSDERMAL
  Filled 2019-01-28: qty 1

## 2019-01-28 MED ORDER — SCOPOLAMINE 1 MG/3DAYS TD PT72
MEDICATED_PATCH | TRANSDERMAL | Status: AC
  Filled 2019-01-28: qty 1

## 2019-01-28 SURGICAL SUPPLY — 86 items
ATTRACTOMAT 16X20 MAGNETIC DRP (DRAPES) ×3 IMPLANT
BAG ISOLATION DRAPE 18X18 (DRAPES) ×4 IMPLANT
BANDAGE ESMARK 6X9 LF (GAUZE/BANDAGES/DRESSINGS) IMPLANT
BNDG ESMARK 6X9 LF (GAUZE/BANDAGES/DRESSINGS)
CANISTER SUCT 3000ML PPV (MISCELLANEOUS) ×3 IMPLANT
CANNULA VESSEL 3MM 2 BLNT TIP (CANNULA) ×6 IMPLANT
CATH EMB 5FR 80CM (CATHETERS) ×3 IMPLANT
CLIP VESOCCLUDE MED 24/CT (CLIP) ×3 IMPLANT
CLIP VESOCCLUDE SM WIDE 24/CT (CLIP) ×3 IMPLANT
CONT SPEC 4OZ CLIKSEAL STRL BL (MISCELLANEOUS) ×3 IMPLANT
COVER WAND RF STERILE (DRAPES) ×3 IMPLANT
CUFF TOURNIQUET SINGLE 24IN (TOURNIQUET CUFF) IMPLANT
CUFF TOURNIQUET SINGLE 34IN LL (TOURNIQUET CUFF) IMPLANT
CUFF TOURNIQUET SINGLE 44IN (TOURNIQUET CUFF) IMPLANT
DERMABOND ADVANCED (GAUZE/BANDAGES/DRESSINGS) ×2
DERMABOND ADVANCED .7 DNX12 (GAUZE/BANDAGES/DRESSINGS) ×4 IMPLANT
DRAIN SNY WOU (WOUND CARE) IMPLANT
DRAPE HALF SHEET 40X57 (DRAPES) IMPLANT
DRAPE ISOLATION BAG 18X18 (DRAPES) ×2
DRAPE X-RAY CASS 24X20 (DRAPES) IMPLANT
DRSG COVADERM 4X14 (GAUZE/BANDAGES/DRESSINGS) ×3 IMPLANT
DRSG COVADERM 4X6 (GAUZE/BANDAGES/DRESSINGS) ×3 IMPLANT
ELECT BLADE 6.5 EXT (BLADE) ×3 IMPLANT
ELECT REM PT RETURN 9FT ADLT (ELECTROSURGICAL) ×3
ELECTRODE REM PT RTRN 9FT ADLT (ELECTROSURGICAL) ×2 IMPLANT
EVACUATOR SILICONE 100CC (DRAIN) IMPLANT
FELT TEFLON 1X6 (MISCELLANEOUS) ×3 IMPLANT
GAUZE SPONGE 4X4 16PLY XRAY LF (GAUZE/BANDAGES/DRESSINGS) ×3 IMPLANT
GLOVE BIO SURGEON STRL SZ7.5 (GLOVE) ×3 IMPLANT
GOWN STRL REUS W/ TWL LRG LVL3 (GOWN DISPOSABLE) ×6 IMPLANT
GOWN STRL REUS W/TWL LRG LVL3 (GOWN DISPOSABLE) ×3
GRAFT HEMASHIELD 18X9MM (Vascular Products) ×3 IMPLANT
HEMOSTAT SURGICEL 2X14 (HEMOSTASIS) ×3 IMPLANT
INSERT FOGARTY 61MM (MISCELLANEOUS) ×6 IMPLANT
INSERT FOGARTY SM (MISCELLANEOUS) ×9 IMPLANT
KIT BASIN OR (CUSTOM PROCEDURE TRAY) ×3 IMPLANT
KIT TURNOVER KIT B (KITS) ×3 IMPLANT
LOOP VESSEL MAXI BLUE (MISCELLANEOUS) ×6 IMPLANT
LOOP VESSEL MINI RED (MISCELLANEOUS) ×9 IMPLANT
NS IRRIG 1000ML POUR BTL (IV SOLUTION) ×9 IMPLANT
PACK AORTA (CUSTOM PROCEDURE TRAY) ×3 IMPLANT
PACK PERIPHERAL VASCULAR (CUSTOM PROCEDURE TRAY) IMPLANT
PAD ARMBOARD 7.5X6 YLW CONV (MISCELLANEOUS) ×6 IMPLANT
PATCH HEMASHIELD 8X75 (Vascular Products) IMPLANT
RETAINER VISCERA MED (MISCELLANEOUS) ×3 IMPLANT
SET COLLECT BLD 21X3/4 12 (NEEDLE) IMPLANT
SPONGE LAP 18X18 X RAY DECT (DISPOSABLE) IMPLANT
STAPLER VISISTAT 35W (STAPLE) ×6 IMPLANT
STOPCOCK 4 WAY LG BORE MALE ST (IV SETS) IMPLANT
SUT ETHILON 3 0 PS 1 (SUTURE) IMPLANT
SUT MNCRL AB 4-0 PS2 18 (SUTURE) ×3 IMPLANT
SUT PDS AB 1 TP1 54 (SUTURE) ×6 IMPLANT
SUT PROLENE 3 0 SH DA (SUTURE) ×6 IMPLANT
SUT PROLENE 3 0 SH1 36 (SUTURE) ×12 IMPLANT
SUT PROLENE 5 0 C 1 24 (SUTURE) ×6 IMPLANT
SUT PROLENE 6 0 C 1 30 (SUTURE) ×3 IMPLANT
SUT PROLENE 6 0 CC (SUTURE) ×21 IMPLANT
SUT PROLENE 6 0 CC 1 (SUTURE) ×15 IMPLANT
SUT PROLENE 7 0 BV 1 (SUTURE) IMPLANT
SUT PROLENE 7 0 BV1 MDA (SUTURE) ×3 IMPLANT
SUT SILK 2 0 (SUTURE) ×1
SUT SILK 2 0 SH (SUTURE) IMPLANT
SUT SILK 2 0 TIES 17X18 (SUTURE) ×1
SUT SILK 2 0SH CR/8 30 (SUTURE) ×3 IMPLANT
SUT SILK 2-0 18XBRD TIE 12 (SUTURE) ×2 IMPLANT
SUT SILK 2-0 18XBRD TIE BLK (SUTURE) ×2 IMPLANT
SUT SILK 3 0 (SUTURE) ×1
SUT SILK 3 0 TIES 17X18 (SUTURE) ×1
SUT SILK 3-0 18XBRD TIE 12 (SUTURE) ×2 IMPLANT
SUT SILK 3-0 18XBRD TIE BLK (SUTURE) ×2 IMPLANT
SUT SILK 4 0 (SUTURE)
SUT SILK 4-0 18XBRD TIE 12 (SUTURE) IMPLANT
SUT VIC AB 2-0 CT1 36 (SUTURE) ×3 IMPLANT
SUT VIC AB 2-0 SH 27 (SUTURE) ×3
SUT VIC AB 2-0 SH 27XBRD (SUTURE) ×6 IMPLANT
SUT VIC AB 3-0 SH 27 (SUTURE) ×8
SUT VIC AB 3-0 SH 27X BRD (SUTURE) ×16 IMPLANT
SUT VIC AB 4-0 PS2 27 (SUTURE) ×6 IMPLANT
SUT VICRYL 4-0 PS2 18IN ABS (SUTURE) IMPLANT
SYR 3ML LL SCALE MARK (SYRINGE) ×3 IMPLANT
TAPE UMBILICAL COTTON 1/8X30 (MISCELLANEOUS) ×9 IMPLANT
TOWEL GREEN STERILE (TOWEL DISPOSABLE) ×3 IMPLANT
TRAY FOLEY MTR SLVR 16FR STAT (SET/KITS/TRAYS/PACK) ×3 IMPLANT
TUBING EXTENTION W/L.L. (IV SETS) IMPLANT
UNDERPAD 30X30 (UNDERPADS AND DIAPERS) ×3 IMPLANT
WATER STERILE IRR 1000ML POUR (IV SOLUTION) ×6 IMPLANT

## 2019-01-28 NOTE — Addendum Note (Signed)
Addendum  created 01/28/19 1822 by Kipp Brood, MD   Clinical Note Signed

## 2019-01-28 NOTE — Op Note (Signed)
Procedure: Aortobifemoral bypass with bilateral common femoral endarterectomy and profundoplasty, bilateral iliac thrombectomy  Preoperative diagnosis: Rest pain bilateral feet  Postoperative diagnosis: Same  Anesthesia: General  Assistant: Clotilde Dieterhris Clark, MD, Lianne CureMaureen Collins, PA-C  Operative findings: # 1  18 x 9 mm Dacron graft, and the ends to the aorta 2 cm below the renals and decide to the common femoral artery with the tip of the patch extending to the first branch point of the left profunda and the third branch point of the right profunda  #2 Suprarenal clamp  #3 posterior tibial Doppler signals at conclusion of case  Specimens: Aortic thrombus    Operative details: After pain informed consent, the patient was taken the operating.  The patient was placed in supine position operating table.  After induction general anesthesia endotracheal ovation a nasogastric tube and Foley catheter placed.  Next longitudinal incision was made in the left groin carried down through the subcutaneous tissues down to the level of the left common femoral artery.  There were dense adhesions from prior femoral embolectomy.  Common femoral artery profunda femoris and superficial femoral arteries were all dissected free circumferentially and Vesseloops placed around these.  There was no pulse within the artery.  I then began to make a retroperitoneal tunnel in the left groin under the inguinal ligament circumflex iliac vein branch was ligated by between silk ties.  Attention was then turned to the right groin.  In similar fashion longitudinal incision was made carried on through subtenons tissues to the right common femoral artery.  There were similar adhesions although more dense on the right side.  Common femoral artery profunda femoris and superficial femoral arteries were all dissected free circumferentially and Vesseloops placed around these.  Retroperitoneal tunnel was begun just beneath the inguinal ligament  on the right side as well the circumflex iliac vein branch was also ligated and divided between silk ties.  At this point a midline laparotomy incision was made extending from the xiphoid to the pubis.  The incision was carried through the subcutaneous tissues fascia and the peritoneum opened for the full length the incision.  Transverse colon was reflected superiorly small bowel was reflected the right retroperitoneum was entered.  Inferior mesenteric artery was identified dissected free circumferentially vessel loop placed around it.  The inferior mesenteric vein was dissected free circumferentially ligated between silk ties and divided.  Omni retractor was then brought up in the operative field at this point to assist in retraction.  Left renal vein was identified and dissected free on its posterior surface.  There were some adhesions in this location but these were fairly filmy in nature.  The left renal vein was retracted superiorly and I was able to fully expose the left and right renal arteries at this point.  These were dissected free circumferentially and Vesseloops placed around them.  A suitable spot for clamping was made just above the left and right renal arteries.  Dissection was then carried down to the level of the infrarenal aorta and this was dissected free circumferentially from the renal arteries all the way down to the level of the inferior mesenteric artery to provide room for clamping and anastomosis.  Peritoneal tunnels were then made connecting the infrarenal aorta to the groin bilaterally in a retro-ureteral retrocolic position.  Umbilical tapes were placed in these locations.  The patient was given 9000 units of intravenous heparin.  He was given an additional 9000 unit bolus and an additional 5000 unit bolus during  the course of the case.  Patient was also given 25 mg of mannitol.  The left and right renal arteries were controlled with Vesseloops.  Suprarenal clamp was secured.  The aorta  was then clamped just above the inferior mesenteric artery.  A transverse opening was made in the aorta about 3 cm below the takeoff of the renal arteries.  There was a large amount of thrombus within the aorta at this point.  This was all moved under direct vision.  The left and right renal orifices were inspected and found to be 3 of thrombus.  An 18 x 9 mm Dacron graft was brought up in the operative field and sewn end-to-end to the aorta just below the level of the renal arteries.  This was done with a running 3-0 Prolene suture.  One pledgeted suture was placed in the posterior wall where there was a slight gap in the suture line.  Anastomosis was then tested found to be hemostatic.  I then opened the infrarenal portion of the aorta to try to remove as much thrombus as possible so that the IMA would be preserved.  This was then oversewn just above the IMA with a running 3-0 Prolene suture.  At this point the left and right limbs of the graft were brought down to the groins.  Attention was turned to the right groin first.  Longitudinal opening was made in the right common femoral artery and there was old thrombus adherent to the wall which required femoral endarterectomy.  A #5 Fogarty catheter was passed up the right iliac system multiple passes and a large amount of fresh thrombus was retrieved there was some trickle of flow down the right external iliac at this point.  I had to dissect down to the tertiary branches of the profunda before there was a achievable lumen again.  A long spatulated hood was created and the graft sewn from the common femoral artery at the level inguinal ligament all the way down to the tertiary branches of the profunda.  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  Despite completion of anastomosis it was for blood backbled and thoroughly flushed.  Estimates was secured clamps released there is pulsatile flow in the profunda immediately.  There was good Doppler flow into  large profunda branches.  Patient also had a posterior tibial Doppler signal.  Hemostasis was obtained.  Attention was then turned to the left groin.  In similar fashion longitudinal opening was made in the left common femoral artery again there was adherent thrombus.  Endarterectomy was performed extending from the common femoral artery all the way down to the origin of the profunda.  I opened the arteriotomy about 2 cm of the profunda to achieve a good large lumen.  The proximal portion of thrombus was removed from the distal external leg artery and there was vigorous inflow at this point.  #5 artery catheter was passed up the left iliac system make sure there was no retained thrombus.  A long spatulated limb was then also created on the left side extending from the common femoral artery down to the first branches of the profunda on the left side.  This was done with a running 6-0 Prolene suture.  Despite completion anastomosis it was for blood backbled and thoroughly flushed reanastomosed was secured clamps released there is pulsatile flow in the front immediately.  He was good Doppler flow in the profunda.  There is also posterior tibial Doppler signal on the left side.  The patient was given 50 mg of protamine.  The retroperitoneum was reapproximated in a running 3-0 Vicryl suture.  The viscera were returned to their normal position.  The small bowel was pink in color as well as the colon.  The inferior mesenteric artery had no flow within it.  Fascia was then reapproximated using a #1 PDS suture.  The skin was then closed with staples.  The groins were then closed in multiple layers with running 2-0 and 3-0 Vicryl suture and a 4-0 Vicryl subcuticular stitch in the skin.  The patient tolerated procedure well and there were no complications.  The instrument sponge and needle count was correct the end of the case.  The patient was taken the recovery room in stable condition.  [name], MD [facility] Office: [phone] Pager: [phone]

## 2019-01-28 NOTE — Anesthesia Postprocedure Evaluation (Signed)
Anesthesia Post Note  Patient: Elian P Schoen  Procedure(s) Performed: AORTA BIFEMORAL BYPASS GRAFT (Bilateral Abdomen) BILATERAL FEMORAL ENDARTERECTOMY WITH PROFUNDAPLASTY (Bilateral Groin) BILATERAL  ILIAC THROMBECTOMY (Bilateral Groin)     Patient location during evaluation: PACU Anesthesia Type: General Level of consciousness: sedated Pain management: pain level controlled Vital Signs Assessment: post-procedure vital signs reviewed and stable Respiratory status: spontaneous breathing and respiratory function stable Cardiovascular status: stable Postop Assessment: no apparent nausea or vomiting Anesthetic complications: no    Last Vitals:  Vitals:   01/28/19 1651 01/28/19 1706  BP: 139/61 134/71  Pulse: (!) 50 (!) 49  Resp: 14 15  Temp:  (!) 36.4 C  SpO2: 98% 100%    Last Pain:  Vitals:   01/28/19 0557  PainSc: 0-No pain                 Kelbi Renstrom DANIEL

## 2019-01-28 NOTE — Anesthesia Procedure Notes (Signed)
Procedure Name: Intubation Date/Time: 01/28/2019 7:51 AM Performed by: Lance Coon, CRNA Pre-anesthesia Checklist: Patient identified, Emergency Drugs available, Patient being monitored, Timeout performed and Suction available Patient Re-evaluated:Patient Re-evaluated prior to induction Oxygen Delivery Method: Circle system utilized Preoxygenation: Pre-oxygenation with 100% oxygen Induction Type: IV induction Ventilation: Mask ventilation without difficulty Laryngoscope Size: Mac and 4 Grade View: Grade II Tube type: Oral Tube size: 7.5 mm Number of attempts: 1 Airway Equipment and Method: Stylet Placement Confirmation: ETT inserted through vocal cords under direct vision,  positive ETCO2 and breath sounds checked- equal and bilateral Secured at: 23 cm Tube secured with: Tape Dental Injury: Teeth and Oropharynx as per pre-operative assessment

## 2019-01-28 NOTE — Anesthesia Procedure Notes (Signed)
Central Venous Catheter Insertion Performed by: Heather Roberts, MD, anesthesiologist Start/End2/09/2019 6:56 AM, 01/28/2019 7:06 AM Patient location: Pre-op. Preanesthetic checklist: patient identified, IV checked, site marked, risks and benefits discussed, surgical consent, monitors and equipment checked, pre-op evaluation, timeout performed and anesthesia consent Position: Trendelenburg Lidocaine 1% used for infiltration and patient sedated Hand hygiene performed , maximum sterile barriers used  and Seldinger technique used Catheter size: 8 Fr Total catheter length 16. Central line was placed.Double lumen Procedure performed using ultrasound guided technique. Ultrasound Notes:anatomy identified, needle tip was noted to be adjacent to the nerve/plexus identified, no ultrasound evidence of intravascular and/or intraneural injection and image(s) printed for medical record Attempts: 1 Following insertion, dressing applied, line sutured and Biopatch. Post procedure assessment: blood return through all ports, free fluid flow and no air  Patient tolerated the procedure well with no immediate complications.

## 2019-01-28 NOTE — Interval H&P Note (Signed)
History and Physical Interval Note:  01/28/2019 7:20 AM  [name] [name]  has presented today for surgery, with the diagnosis of PERIPHERAL ARTERY DISEASE  The various methods of treatment have been discussed with the patient and family. After consideration of risks, benefits and other options for treatment, the patient has consented to  Procedure(s): AORTA BIFEMORAL BYPASS GRAFT (Bilateral) BILATERAL FEMORAL ENDARTERECTOMY (Bilateral) BYPASS GRAFT BILATERAL  FEMORAL-POPLITEAL ARTERY (Bilateral) as a surgical intervention .  The patient's history has been reviewed, patient examined, no change in status, stable for surgery.  I have reviewed the patient's chart and labs.  Questions were answered to the patient's satisfaction.     [name]

## 2019-01-28 NOTE — Transfer of Care (Signed)
Immediate Anesthesia Transfer of Care Note  Patient: [name] [name]  Procedure(s) Performed: AORTA BIFEMORAL BYPASS GRAFT (Bilateral Abdomen) BILATERAL FEMORAL ENDARTERECTOMY WITH PROFUNDAPLASTY (Bilateral Groin) BILATERAL  ILIAC THROMBECTOMY (Bilateral Groin)  Patient Location: PACU  Anesthesia Type:General  Level of Consciousness: drowsy and patient cooperative  Airway & Oxygen Therapy: Patient Spontanous Breathing  Post-op Assessment: Report given to RN and Post -op Vital signs reviewed and stable  Post vital signs: Reviewed and stable  Last Vitals:  Vitals Value Taken Time  BP 129/73 01/28/2019  3:36 PM  Temp    Pulse 52 01/28/2019  3:39 PM  Resp 15 01/28/2019  3:39 PM  SpO2 96 % 01/28/2019  3:39 PM  Vitals shown include unvalidated device data.  Last Pain:  Vitals:   01/28/19 0557  PainSc: 0-No pain      Patients Stated Pain Goal: 2 (01/28/19 0557)  Complications: No apparent anesthesia complications

## 2019-01-28 NOTE — Plan of Care (Signed)
  Problem: Cardiac: Goal: Will achieve and/or maintain hemodynamic stability Outcome: Progressing   Problem: Clinical Measurements: Goal: Postoperative complications will be avoided or minimized Outcome: Progressing   Problem: Respiratory: Goal: Respiratory status will improve Outcome: Progressing   Problem: Skin Integrity: Goal: Wound healing without signs and symptoms of infection Outcome: Progressing   Problem: Urinary Elimination: Goal: Ability to achieve and maintain adequate renal perfusion and functioning will improve Outcome: Progressing   Problem: Clinical Measurements: Goal: Will remain free from infection Outcome: Progressing   Problem: Elimination: Goal: Will not experience complications related to urinary retention Outcome: Progressing

## 2019-01-28 NOTE — Anesthesia Procedure Notes (Signed)
Arterial Line Insertion Start/End2/09/2019 6:45 AM, 01/28/2019 6:55 AM Performed by: Rosiland Oz, CRNA, CRNA  Patient location: Pre-op. Preanesthetic checklist: patient identified, IV checked, site marked, risks and benefits discussed, surgical consent, monitors and equipment checked, pre-op evaluation, timeout performed and anesthesia consent Lidocaine 1% used for infiltration and patient sedated Left, radial was placed Catheter size: 20 G Hand hygiene performed , maximum sterile barriers used  and Seldinger technique used  Attempts: 1 Procedure performed without using ultrasound guided technique. Following insertion, dressing applied and Biopatch. Post procedure assessment: normal and unchanged  Patient tolerated the procedure well with no immediate complications.

## 2019-01-28 NOTE — Progress Notes (Signed)
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  Today he underwent aorto bifemoral bypass grafting by Dr. Darrick Penna.  The procedure was uneventful.  In the recovery room, approximately 2 hours after surgery, he had a run of atrial fibrillation with a rate of 140-150.  His blood pressure at that time was 100-110 systolic.  After 2-3 minutes, he  spontaneously converted to sinus rhythm at a rate of 55-60 with a blood pressure of 140/80.  Postoperative electrolytes showed a potassium of 4.8 BUN 14 creatinine 1.38 calcium 8.6 with ionized calcium 1.07.  Twelve-lead ECG showed no acute changes and it was obtained after he had converted into sinus rhythm.  Impression: 2 to 3-minute run of rapid atrial fibrillation in 68 year old male status post aortobifem bypass with a history of paroxysmal atrial fibrillation.  He appears stable at this time will transfer to [facility].  Kipp Brood

## 2019-01-29 ENCOUNTER — Encounter (HOSPITAL_COMMUNITY): Payer: Self-pay | Admitting: General Practice

## 2019-01-29 ENCOUNTER — Inpatient Hospital Stay (HOSPITAL_COMMUNITY): Payer: Medicare HMO

## 2019-01-29 LAB — COMPREHENSIVE METABOLIC PANEL
ALT: 35 U/L (ref 0–44)
AST: 41 U/L (ref 15–41)
Albumin: 3.3 g/dL — ABNORMAL LOW (ref 3.5–5.0)
Alkaline Phosphatase: 35 U/L — ABNORMAL LOW (ref 38–126)
Anion gap: 11 (ref 5–15)
BILIRUBIN TOTAL: 2.5 mg/dL — AB (ref 0.3–1.2)
BUN: 15 mg/dL (ref 8–23)
CO2: 21 mmol/L — ABNORMAL LOW (ref 22–32)
Calcium: 8.3 mg/dL — ABNORMAL LOW (ref 8.9–10.3)
Chloride: 106 mmol/L (ref 98–111)
Creatinine, Ser: 1.4 mg/dL — ABNORMAL HIGH (ref 0.61–1.24)
GFR, EST AFRICAN AMERICAN: 59 mL/min — AB (ref >60)
GFR, EST NON AFRICAN AMERICAN: 51 mL/min — AB (ref >60)
Glucose, Bld: 125 mg/dL — ABNORMAL HIGH (ref 70–99)
Potassium: 4.4 mmol/L (ref 3.5–5.1)
Sodium: 138 mmol/L (ref 135–145)
Total Protein: 5.7 g/dL — ABNORMAL LOW (ref 6.5–8.1)

## 2019-01-29 LAB — CBC
HCT: 41.2 % (ref 39.0–52.0)
Hemoglobin: 13.3 g/dL (ref 13.0–17.0)
MCH: 29.8 pg (ref 26.0–34.0)
MCHC: 32.3 g/dL (ref 30.0–36.0)
MCV: 92.4 fL (ref 80.0–100.0)
Platelets: 150 10*3/uL (ref 150–400)
RBC: 4.46 MIL/uL (ref 4.22–5.81)
RDW: 14.1 % (ref 11.5–15.5)
WBC: 15.6 10*3/uL — ABNORMAL HIGH (ref 4.0–10.5)
nRBC: 0 % (ref 0.0–0.2)

## 2019-01-29 LAB — HEPARIN LEVEL (UNFRACTIONATED): Heparin Unfractionated: 0.32 IU/mL (ref 0.30–0.70)

## 2019-01-29 LAB — MAGNESIUM: Magnesium: 1.5 mg/dL — ABNORMAL LOW (ref 1.7–2.4)

## 2019-01-29 LAB — AMYLASE: AMYLASE: 104 U/L — AB (ref 28–100)

## 2019-01-29 MED ORDER — SODIUM CHLORIDE 0.9% FLUSH: 10.0000 mL | INTRAVENOUS | Status: DC | PRN

## 2019-01-29 MED ORDER — DILTIAZEM HCL-DEXTROSE 100-5 MG/100ML-% IV SOLN (PREMIX)
5.0000 mg/h | INTRAVENOUS | Status: DC
  Administered 2019-01-29: 5 mg/h via INTRAVENOUS
  Administered 2019-01-29 – 2019-02-01 (×7): 15 mg/h via INTRAVENOUS
  Filled 2019-01-29 (×10): qty 100

## 2019-01-29 MED ORDER — METOPROLOL TARTRATE 5 MG/5ML IV SOLN
5.0000 mg | INTRAVENOUS | Status: AC
  Administered 2019-01-29: 5 mg via INTRAVENOUS

## 2019-01-29 MED ORDER — HEPARIN (PORCINE) 25000 UT/250ML-% IV SOLN
1150.0000 [IU]/h | INTRAVENOUS | Status: DC
  Administered 2019-01-29: 1200 [IU]/h via INTRAVENOUS
  Administered 2019-01-30 (×2): 1250 [IU]/h via INTRAVENOUS
  Filled 2019-01-29 (×3): qty 250

## 2019-01-29 MED ORDER — SODIUM CHLORIDE 0.9% FLUSH
10.0000 mL | Freq: Two times a day (BID) | INTRAVENOUS | Status: DC
  Administered 2019-01-29 (×2): 10 mL
  Administered 2019-01-30: 30 mL
  Administered 2019-01-31 – 2019-02-03 (×5): 10 mL

## 2019-01-29 MED ORDER — CHLORHEXIDINE GLUCONATE CLOTH 2 % EX PADS
6.0000 | MEDICATED_PAD | Freq: Every day | CUTANEOUS | Status: DC
  Administered 2019-01-29: 6 via TOPICAL

## 2019-01-29 NOTE — Progress Notes (Signed)
Physical Therapy Treatment Patient Details Name: [name] CreekRichard P [name] MRN: [id number] DOB: 12/30/1950 Today's Date: 01/29/2019    History of Present Illness Pt s/p Aortobifemoral bypass on 2/10. 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     PT Comments    Pt able to progress to amb out to hallway with second person to assist. Continue to recommend [name]. Pt present and confirms pt was independent prior to hospitalization.    Follow Up Recommendations  [name]     Equipment Recommendations  Other (comment)    Recommendations for Other Services       Precautions / Restrictions Precautions Precautions: Fall;Other (comment)(aphasia)    Mobility  Bed Mobility Overal bed mobility: Needs Assistance Bed Mobility: Sit to Supine   Sit to supine: +2 for physical assistance;Max assist   General bed mobility comments: Assist to lower trunk and bring legs up into bed  Transfers Overall transfer level: Needs assistance Equipment used: Rolling walker (2 wheeled) Transfers: Sit to/from Stand Sit to Stand: Min assist;+2 physical assistance         General transfer comment: Assist to bring hips up and for balance  Ambulation/Gait Ambulation/Gait assistance: Min assist;+2 physical assistance Gait Distance (Feet): 35 Feet Assistive device: Rolling walker (2 wheeled) Gait Pattern/deviations: Step-through pattern;Decreased step length - right;Trunk flexed;Drifts right/left Gait velocity: decr Gait velocity interpretation: <1.31 ft/sec, indicative of household ambulator General Gait Details: Assist for balance and support and to help direct walker. Distance limited due to fatigue and only 1 person assist    Stairs             Wheelchair Mobility    Modified Rankin (Stroke Patients Only)       Balance Overall balance assessment: Needs assistance Sitting-balance support: No upper extremity supported;Feet supported Sitting balance-Leahy Scale:  Fair     Standing balance support: Bilateral upper extremity supported;During functional activity Standing balance-Leahy Scale: Poor Standing balance comment: walker and min assist for static standing                            Cognition Arousal/Alertness: Awake/alert Behavior During Therapy: WFL for tasks assessed/performed Overall Cognitive Status: Difficult to assess                                 General Comments: Pt seems at baseline cognition. Able to answer PLOF and home questions; sister present to confirm information. Difficult to assess completely due to baseline aphasia      Exercises      General Comments General comments (skin integrity, edema, etc.): Sister at bedside      Pertinent Vitals/Pain Pain Assessment: Faces Faces Pain Scale: Hurts even more Pain Location: Abdomen Pain Descriptors / Indicators: Constant;Grimacing;Discomfort Pain Intervention(s): Limited activity within patient's tolerance;Monitored during session;Repositioned    Home Living Family/patient expects to be discharged to:: Private residence Living Arrangements: Alone Available Help at Discharge: Family;Available PRN/intermittently(May be able to provide increased support at dc) Type of Home: House Home Access: Stairs to enter Entrance Stairs-Rails: None Home Layout: One level Home Equipment: Environmental consultantWalker - 2 wheels;Cane - quad;Shower seat;Grab bars - tub/shower Additional Comments: one dog    Prior Function Level of Independence: Independent      Comments: ADLs, IADLs, driving. Mother recently passed away amonth ago   PT Goals (current goals can now be found in the care plan  section) Acute Rehab PT Goals Patient Stated Goal: walk PT Goal Formulation: With patient Time For Goal Achievement: 02/12/19 Potential to Achieve Goals: Good Progress towards PT goals: Progressing toward goals    Frequency    Min 3X/week      PT Plan Current plan remains  appropriate    Co-evaluation PT/OT/SLP Co-Evaluation/Treatment: Yes Reason for Co-Treatment: For patient/therapist safety   OT goals addressed during session: ADL's and self-care      AM-PAC PT "6 Clicks" Mobility   Outcome Measure  Help needed turning from your back to your side while in a flat bed without using bedrails?: A Little Help needed moving from lying on your back to sitting on the side of a flat bed without using bedrails?: A Lot Help needed moving to and from a bed to a chair (including a wheelchair)?: A Lot Help needed standing up from a chair using your arms (e.g., wheelchair or bedside chair)?: A Little Help needed to walk in hospital room?: A Little Help needed climbing 3-5 steps with a railing? : A Lot 6 Click Score: 15    End of Session Equipment Utilized During Treatment: Gait belt Activity Tolerance: Patient tolerated treatment well Patient left: in bed;with call bell/phone within reach;with family/visitor present;with bed alarm set Nurse Communication: Mobility status PT Visit Diagnosis: Other abnormalities of gait and mobility (R26.89);Muscle weakness (generalized) (M62.81);Pain Pain - part of body: (abdomen)     Time: [id number] PT Time Calculation (min) (ACUTE ONLY): 14 min  Charges:  $Gait Training: 8-22 mins                     [facility] Center[facility] Anevay Campanella PT Acute Rehabilitation Services Pager [phone] Office [phone]    Angelina OkCary W [facility] HospitalMaycok 01/29/2019, 5:24 PM

## 2019-01-29 NOTE — Progress Notes (Signed)
Patient noted to be in a fib with RVR in 130s-140s. 5 mg lopressor given with no change in HR. On call MD Edilia Bo paged. Orders to start cardizem gtt and that he will notify Fields MD of change in patient status.

## 2019-01-29 NOTE — Evaluation (Addendum)
Physical Therapy Evaluation Patient Details Name: [name] [name] MRN: [id number] DOB: August 04, 1951 Today's Date: 01/29/2019   History of Present Illness  Pt s/p Aortobifemoral bypass on 2/10. 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   Clinical Impression  Pt admitted with above diagnosis and presents to PT with functional limitations due to deficits listed below (See PT problem list). Pt needs skilled PT to maximize independence and safety to allow discharge to [name]. Pt with residual weakness from CVA that will make recovery slower than typical. Pt had returned to independence after his CVA and is motivated to return to prior level of function.     Follow Up Recommendations [name]    Equipment Recommendations  Other (comment)    Recommendations for Other Services       Precautions / Restrictions Precautions Precautions: Fall;Other (comment)(aphasia)      Mobility  Bed Mobility Overal bed mobility: Needs Assistance Bed Mobility: [name] to Supine    [name] to supine: Mod assist   General bed mobility comments: Assist to lower trunk and bring legs up into bed  Transfers Overall transfer level: Needs assistance Equipment used: Rolling walker (2 wheeled);Ambulation equipment used Transfers: [name] to/from Stand [name] to Stand: Mod assist         General transfer comment: Assist to bring hips up. From low recliner had to stand using Stedy and used to pivot pt to bed. From bed stood with mod assist  with walker.  Ambulation/Gait Ambulation/Gait assistance: Min Chemical engineer (Feet): 6 Feet Assistive device: 4-wheeled walker Gait Pattern/deviations: Step-through pattern;Decreased step length - right;Trunk flexed;Drifts right/left Gait velocity: decr Gait velocity interpretation: <1.31 ft/sec, indicative of household ambulator General Gait Details: Assist for balance and support and to help direct walker. Distance limited due to fatigue and  only 1 person assist   Stairs            Wheelchair Mobility    Modified Rankin (Stroke Patients Only)       Balance Overall balance assessment: Needs assistance Sitting-balance support: No upper extremity supported;Feet supported Sitting balance-Leahy Scale: Fair     Standing balance support: Bilateral upper extremity supported;During functional activity Standing balance-Leahy Scale: Poor Standing balance comment: walker and min assist for static standing                             Pertinent Vitals/Pain Pain Assessment: Faces Faces Pain Scale: Hurts even more Pain Location: Abdomen Pain Descriptors / Indicators: Constant;Grimacing;Discomfort Pain Intervention(s): Limited activity within patient's tolerance;Monitored during session;Repositioned    Home Living Family/patient expects to be discharged to:: Private residence Living Arrangements: Alone Available Help at Discharge: Family;Available PRN/intermittently(May be able to provide increased support at dc) Type of Home: House Home Access: Stairs to enter Entrance Stairs-Rails: None Entrance Stairs-Number of Steps: 2-3 front; 3-4 back Home Layout: One level Home Equipment: Walker - 2 wheels;Cane - quad;Shower seat;Grab bars - tub/shower Additional Comments: one dog    Prior Function Level of Independence: Independent         Comments: ADLs, IADLs, driving. Mother recently passed away amonth ago     Hand Dominance   Dominant Hand: Right    Extremity/Trunk Assessment    Lower Extremity Assessment Lower Extremity Assessment: Generalized weakness;RLE deficits/detail RLE Deficits / Details: residual weakness from CVA    Cervical / Trunk Assessment Cervical / Trunk Assessment: Other exceptions Cervical / Trunk Exceptions: Forward flexion due  to pain at abdomen  Communication   Communication: Expressive difficulties  Cognition Arousal/Alertness: Awake/alert Behavior During Therapy: WFL  for tasks assessed/performed Overall Cognitive Status: Difficult to assess                                 General Comments: Pt seems at baseline cognition. Able to answer PLOF and home questions; sister present to confirm information. Difficult to assess completely due to baseline aphasia      General Comments General comments (skin integrity, edema, etc.): Sister at bedside    Exercises     Assessment/Plan    PT Assessment Patient needs continued PT services  PT Problem List Decreased strength;Decreased balance;Decreased activity tolerance;Decreased mobility;Decreased knowledge of use of DME;Pain       PT Treatment Interventions DME instruction;Gait training;Stair training;Functional mobility training;Therapeutic activities;Therapeutic exercise;Balance training;Patient/family education    PT Goals (Current goals can be found in the Care Plan section)  Acute Rehab PT Goals Patient Stated Goal: walk PT Goal Formulation: With patient Time For Goal Achievement: 02/12/19 Potential to Achieve Goals: Good    Frequency Min 3X/week   Barriers to discharge Decreased caregiver support;Inaccessible home environment lives alone, stairs to enter    Co-evaluation   Reason for Co-Treatment: For patient/therapist safety;To address functional/ADL transfers   OT goals addressed during session: ADL's and self-care       AM-PAC PT "6 Clicks" Mobility  Outcome Measure Help needed turning from your back to your side while in a flat bed without using bedrails?: A Little Help needed moving from lying on your back to sitting on the side of a flat bed without using bedrails?: A Lot Help needed moving to and from a bed to a chair (including a wheelchair)?: A Lot Help needed standing up from a chair using your arms (e.g., wheelchair or bedside chair)?: A Lot Help needed to walk in hospital room?: A Little Help needed climbing 3-5 steps with a railing? : A Lot 6 Click Score:  14    End of Session Equipment Utilized During Treatment: Gait belt Activity Tolerance: Patient limited by pain;Patient limited by fatigue Patient left: in bed;with call bell/phone within reach Nurse Communication: Mobility status PT Visit Diagnosis: Other abnormalities of gait and mobility (R26.89);Muscle weakness (generalized) (M62.81);Pain Pain - part of body: (abdomen)    Time: 1205-1226 PT Time Calculation (min) (ACUTE ONLY): 21 min   Charges:   PT Evaluation $PT Eval Moderate Complexity: 1 Mod          [facility] CenterCary Miyoshi Ligas PT Acute Rehabilitation Services Pager [phone] Office [phone]   Angelina OkCary W [facility] At [facility] BeachMaycok 01/29/2019, 5:18 PM

## 2019-01-29 NOTE — Progress Notes (Signed)
ANTICOAGULATION CONSULT NOTE - Initial Consult  Pharmacy Consult for Heparin Indication: atrial fibrillation  No Known Allergies  Patient Measurements: Height: 6\' 5"  (195.6 cm) Weight: 186 lb (84.4 kg) IBW/kg (Calculated) : 89.1  Vital Signs: Temp: 97.6 F (36.4 C) (02/11 0812) Temp Source: Oral (02/11 0812) BP: 110/71 (02/11 0600) Pulse Rate: 74 (02/11 0600)  Labs: Recent Labs    01/28/19 0624  01/28/19 1428 01/28/19 1537 01/29/19 0209  HGB  --    < > 12.6* 13.7 13.3  HCT  --    < > 37.0* 42.8 41.2  PLT  --   --   --  153 150  APTT 29  --   --  34  --   LABPROT 14.7  --   --   --   --   INR 1.16  --   --   --   --   CREATININE  --   --   --  1.38* 1.40*   < > = values in this interval not displayed.    Estimated Creatinine Clearance: 59.4 mL/min (A) (by C-G formula based on SCr of 1.4 mg/dL (H)).   Medical History: Past Medical History:  Diagnosis Date  . Afib (HCC)    a. Paroxysmal --> previous issues with bradycardia while on Amiodarone and he refused PPM placement. Rate-control strategy pursued.   . Chronic kidney disease    unknown problem [place]  . Dysrhythmia    Atril Fib  . Peripheral vascular disease (HCC)   . Stroke [facility](HCC)    Aphasia, right sided weakness     Assessment: 68 yo male s/p aortabifem bypass.  Patient on warfarin PTA. Pharmacy consulted to start heparin for atrial fibrillation (no bolus). 2/10 INR 1.16  Home Warfarin dose 1.5 mg po qday  Goal of Therapy:  Heparin level 0.3-0.7 units/ml Monitor platelets by anticoagulation protocol: Yes   Plan:  Start heparin infusion at 1200 units/hr Check anti-Xa level in 6-8 hours and daily while on heparin Continue to monitor H&H and platelets  Jeanella Caraathy Jadis Mika, PharmD, [facility] CenterFCCM Clinical Pharmacist Please see AMION for all Pharmacists' Contact Phone Numbers 01/29/2019, 8:17 AM

## 2019-01-29 NOTE — Evaluation (Signed)
Occupational Therapy Evaluation Patient Details Name: [name] [name] MRN: [id number] DOB: January 08, 1951 Today's Date: 01/29/2019    History of Present Illness  68 yo male never smoker. 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 S/p Aortobifemoral bypass on 2/10.    Clinical Impression   PTA, pt was living alone and was independent with ADLs, IADLs, and driving; pt's was living with his mother till she past away a month ago. Pt's sister present during eval and able to confirm information of PLOF and home set up. Pt currently requiring Min A for UB ADLs, Max A for LB ADLs, and Mod A +2 for functional mobility with RW. Pt presenting with decreased strength, balance, and activity tolerance. Pt is highly motivated to participate in therapy despite pain. Pt will require further acute OT to facilitate safe dc. Recommend dc to [name] for intensive OT to optimize safety, independence with ADLs, and return to PLOF.      Follow Up Recommendations  [name];Supervision/Assistance - 24 hour    Equipment Recommendations  Other (comment)(Defer to next venue)    Recommendations for Other Services PT consult     Precautions / Restrictions Precautions Precautions: Fall;Other (comment)(aphasia)      Mobility Bed Mobility Overal bed mobility: Needs Assistance Bed Mobility: Rolling;Sidelying to Sit;Sit to Supine Rolling: Min assist Sidelying to sit: Max assist   Sit to supine: Max assist;+2 for physical assistance   General bed mobility comments: Max A to elevate trunk for sidelying to sitting at EOB. Max A +2 for returning to supine with increased pain  Transfers Overall transfer level: Needs assistance Equipment used: Rolling walker (2 wheeled) Transfers: Sit to/from Stand Sit to Stand: Min assist;+2 physical assistance;From elevated surface         General transfer comment: Min A +2 to power up into standing for elevated surface. Requiring  assistance to gain balance and achieve correct hand position on RW.     Balance Overall balance assessment: Needs assistance Sitting-balance support: No upper extremity supported;Feet supported Sitting balance-Leahy Scale: Fair     Standing balance support: Bilateral upper extremity supported;During functional activity Standing balance-Leahy Scale: Poor Standing balance comment: Reliant on UE support                           ADL either performed or assessed with clinical judgement   ADL Overall ADL's : Needs assistance/impaired Eating/Feeding: NPO   Grooming: Minimal assistance;Standing;Wash/dry face Grooming Details (indicate cue type and reason): Pt standing at sink with Min A for standing balance to wash his face. Pt reliant on UE support and difficlty completing bilateral cooridnation tasks.  Upper Body Bathing: Minimal assistance;Sitting   Lower Body Bathing: Moderate assistance;Sit to/from stand   Upper Body Dressing : Minimal assistance;Sitting   Lower Body Dressing: Maximal assistance;Sit to/from stand Lower Body Dressing Details (indicate cue type and reason): Max A due to difficult bending forward with abdominal pain Toilet Transfer: Minimal assistance;Ambulation;+2 for safety/equipment;RW(simulated to recliner)           Functional mobility during ADLs: Rolling walker;Moderate assistance;+2 for physical assistance;+2 for safety/equipment(RW management) General ADL Comments: Pt presenting with decreasesd strength, balance, and activity tolerance. Highly motivated and wanting to participate despite pain.      Vision Baseline Vision/History: Wears glasses Wears Glasses: Reading only Patient Visual Report: No change from baseline       Perception     Praxis  Pertinent Vitals/Pain Pain Assessment: Faces Faces Pain Scale: Hurts little more Pain Location: Abdomen Pain Descriptors / Indicators: Constant;Grimacing;Discomfort Pain Intervention(s):  Monitored during session;Limited activity within patient's tolerance;Repositioned     Hand Dominance Right   Extremity/Trunk Assessment Upper Extremity Assessment Upper Extremity Assessment: RUE deficits/detail RUE Deficits / Details: Baseline right sided weakness due to prior CVA.  RUE Coordination: decreased fine motor;decreased gross motor   Lower Extremity Assessment Lower Extremity Assessment: Defer to PT evaluation   Cervical / Trunk Assessment Cervical / Trunk Assessment: Other exceptions Cervical / Trunk Exceptions: Forward flexion due to pain at abdomen   Communication Communication Communication: Expressive difficulties   Cognition Arousal/Alertness: Awake/alert Behavior During Therapy: WFL for tasks assessed/performed Overall Cognitive Status: Difficult to assess                                 General Comments: Pt seems at baseline cognition. Able to answer PLOF and home questions; sister present to confirm information. Difficult to assess completely due to baseline aphasia   General Comments  Sister at bedside    Exercises     Shoulder Instructions      Home Living Family/patient expects to be discharged to:: Private residence Living Arrangements: Alone Available Help at Discharge: Family;Available PRN/intermittently(May be able to provide increased support at dc) Type of Home: House Home Access: Stairs to enter [employer] of Steps: 2-3 front; 3-4 back Entrance Stairs-Rails: None Home Layout: One level     Bathroom Shower/Tub: Chief Strategy Officer: Standard     Home Equipment: Environmental consultant - 2 wheels;Cane - quad;Shower seat;Grab bars - tub/shower   Additional Comments: one dog      Prior Functioning/Environment Level of Independence: Independent        Comments: ADLs, IADLs, driving. Mother recently passed away amonth ago        OT Problem List: Decreased strength;Decreased range of motion;Decreased  activity tolerance;Impaired balance (sitting and/or standing);Decreased knowledge of use of DME or AE;Decreased knowledge of precautions;Pain;Impaired UE functional use      OT Treatment/Interventions: Self-care/ADL training;Therapeutic exercise;Energy conservation;DME and/or AE instruction;Therapeutic activities;Patient/family education;Balance training    OT Goals(Current goals can be found in the care plan section) Acute Rehab OT Goals Patient Stated Goal: "Return to independence" OT Goal Formulation: With patient Time For Goal Achievement: 02/12/19 Potential to Achieve Goals: Good  OT Frequency: Min 2X/week   Barriers to D/C:            Co-evaluation PT/OT/SLP Co-Evaluation/Treatment: Yes Reason for Co-Treatment: For patient/therapist safety;To address functional/ADL transfers   OT goals addressed during session: ADL's and self-care      AM-PAC OT "6 Clicks" Daily Activity     Outcome Measure Help from another person eating meals?: Total Help from another person taking care of personal grooming?: A Little Help from another person toileting, which includes using toliet, bedpan, or urinal?: A Lot Help from another person bathing (including washing, rinsing, drying)?: A Lot Help from another person to put on and taking off regular upper body clothing?: A Little Help from another person to put on and taking off regular lower body clothing?: A Lot 6 Click Score: 13   End of Session Equipment Utilized During Treatment: Gait belt;Rolling walker Nurse Communication: Mobility status;Other (comment)(Poor SpO2 reading on monitor)  Activity Tolerance: Patient tolerated treatment well;Patient limited by pain Patient left: in bed;with call bell/phone within reach;with family/visitor present;with bed alarm set  OT Visit Diagnosis: Unsteadiness on feet (R26.81);Other abnormalities of gait and mobility (R26.89);Muscle weakness (generalized) (M62.81);Pain Pain - part of body: (Abdomen)                 Time: [id number] OT Time Calculation (min): 28 min Charges:  OT General Charges $OT Visit: 1 Visit OT Evaluation $OT Eval Moderate Complexity: 1 Mod  Calistro Rauf MSOT, OTR/L Acute Rehab Pager: [phone] Office: [phone]  Theodoro GristCharis M Susana Duell 01/29/2019, 4:49 PM

## 2019-01-29 NOTE — Progress Notes (Signed)
CRITICAL VALUE ALERT  Critical Value: Heart rate sustaining in 150s to 160s. Metoprolol 5 mg IV. Contacted [name], MD    Orders Received/Actions taken: Give Metoprolol 5 mg IV.

## 2019-01-29 NOTE — Progress Notes (Signed)
Pt had not voided after foley came out per day shift report, Bladder scan was 307 ml. Pt voided 250 ml after bladder scan. Will continue to monitor.

## 2019-01-29 NOTE — Progress Notes (Signed)
[facility]  Subjective  - feels ok, hoarse   Objective 110/71 74 (!) 97.5 F (36.4 C) (Oral) (!) 21 97%  Intake/Output Summary (Last 24 hours) at 01/29/2019 0736 Last data filed at 01/29/2019 0600 Gross per 24 hour  Intake 7399.22 ml  Output 5750 ml  Net 1649.22 ml   Abdomen soft no real distention Feet pink warm doppler signals  Assessment/Planning: Paroxysmal afib.  2 rapid bursts yesterday non sustained intermittent metoprolol until taking PO, Bradycardia with amio in past  Will start IV heparin today trend hemoglobin  NG central line foley out  Ambulate  Transfer 4e  Strict NPO until passing flatus  [name] [name] 01/29/2019 7:36 AM --  Laboratory Lab Results: Recent Labs    01/28/19 1537 01/29/19 0209  WBC 19.5* 15.6*  HGB 13.7 13.3  HCT 42.8 41.2  PLT 153 150   BMET Recent Labs    01/28/19 1537 01/29/19 0209  NA 138 138  K 4.8 4.4  CL 109 106  CO2 21* 21*  GLUCOSE 171* 125*  BUN 14 15  CREATININE 1.38* 1.40*  CALCIUM 8.6* 8.3*    COAG Lab Results  Component Value Date   INR 1.16 01/28/2019   INR 2.38 01/22/2019   INR 2.9 07/18/2018   No results found for: PTT

## 2019-01-29 NOTE — Progress Notes (Signed)
ANTICOAGULATION CONSULT NOTE  Pharmacy Consult:  Heparin Indication: atrial fibrillation  No Known Allergies  Patient Measurements: Height: 6\' 5"  (195.6 cm) Weight: 186 lb (84.4 kg) IBW/kg (Calculated) : 89.1  Heparin dosing weight = 84 kg  Vital Signs: Temp: 98.2 F (36.8 C) (02/11 1153) Temp Source: Oral (02/11 1153) BP: 109/76 (02/11 1300) Pulse Rate: 86 (02/11 1300)  Labs: Recent Labs    01/28/19 0624  01/28/19 1428 01/28/19 1537 01/29/19 0209 01/29/19 1543  HGB  --    < > 12.6* 13.7 13.3  --   HCT  --    < > 37.0* 42.8 41.2  --   PLT  --   --   --  153 150  --   APTT 29  --   --  34  --   --   LABPROT 14.7  --   --   --   --   --   INR 1.16  --   --   --   --   --   HEPARINUNFRC  --   --   --   --   --  0.32  CREATININE  --   --   --  1.38* 1.40*  --    < > = values in this interval not displayed.    Estimated Creatinine Clearance: 59.4 mL/min (A) (by C-G formula based on SCr of 1.4 mg/dL (H)).  Assessment: 4 YOM s/p aortabifem bypass to continue on IV heparin for history of Afib while Coumadin is on hold.  Heparin level is therapeutic and low normal.  No bleeding reported.  Goal of Therapy:  Heparin level 0.3-0.7 units/ml Monitor platelets by anticoagulation protocol: Yes   Plan:  Increase heparin gtt to 1250 units/hr F/U AM labs  Zandon Talton D. Laney Potash, PharmD, BCPS, BCCCP 01/29/2019, 4:53 PM

## 2019-01-30 LAB — BASIC METABOLIC PANEL
Anion gap: 11 (ref 5–15)
BUN: 15 mg/dL (ref 8–23)
CO2: 23 mmol/L (ref 22–32)
Calcium: 8.1 mg/dL — ABNORMAL LOW (ref 8.9–10.3)
Chloride: 101 mmol/L (ref 98–111)
Creatinine, Ser: 1.39 mg/dL — ABNORMAL HIGH (ref 0.61–1.24)
GFR, EST AFRICAN AMERICAN: 60 mL/min — AB (ref >60)
GFR, EST NON AFRICAN AMERICAN: 51 mL/min — AB (ref >60)
Glucose, Bld: 115 mg/dL — ABNORMAL HIGH (ref 70–99)
Potassium: 4.1 mmol/L (ref 3.5–5.1)
Sodium: 135 mmol/L (ref 135–145)

## 2019-01-30 LAB — CBC
HCT: 40.4 % (ref 39.0–52.0)
Hemoglobin: 12.9 g/dL — ABNORMAL LOW (ref 13.0–17.0)
MCH: 30.2 pg (ref 26.0–34.0)
MCHC: 31.9 g/dL (ref 30.0–36.0)
MCV: 94.6 fL (ref 80.0–100.0)
Platelets: 145 10*3/uL — ABNORMAL LOW (ref 150–400)
RBC: 4.27 MIL/uL (ref 4.22–5.81)
RDW: 14.7 % (ref 11.5–15.5)
WBC: 15.4 10*3/uL — ABNORMAL HIGH (ref 4.0–10.5)
nRBC: 0 % (ref 0.0–0.2)

## 2019-01-30 LAB — HEPARIN LEVEL (UNFRACTIONATED): HEPARIN UNFRACTIONATED: 0.37 [IU]/mL (ref 0.30–0.70)

## 2019-01-30 MED ORDER — METOPROLOL TARTRATE 25 MG PO TABS
25.0000 mg | ORAL_TABLET | Freq: Every day | ORAL | Status: DC
  Administered 2019-01-30: 25 mg via ORAL
  Filled 2019-01-30: qty 1

## 2019-01-30 MED ORDER — WARFARIN SODIUM 1 MG PO TABS
1.5000 mg | ORAL_TABLET | Freq: Every day | ORAL | Status: DC
  Administered 2019-01-30: 1.5 mg via ORAL
  Filled 2019-01-30 (×2): qty 1

## 2019-01-30 MED ORDER — ASPIRIN 325 MG PO TABS
325.0000 mg | ORAL_TABLET | Freq: Every day | ORAL | Status: DC
  Administered 2019-01-30 – 2019-02-04 (×5): 325 mg via ORAL
  Filled 2019-01-30 (×6): qty 1

## 2019-01-30 MED ORDER — ATORVASTATIN CALCIUM 10 MG PO TABS
20.0000 mg | ORAL_TABLET | Freq: Every day | ORAL | Status: DC
  Administered 2019-01-30 – 2019-01-31 (×2): 20 mg via ORAL
  Filled 2019-01-30 (×2): qty 2

## 2019-01-30 MED ORDER — WARFARIN - PHYSICIAN DOSING INPATIENT: Freq: Every day | Status: DC

## 2019-01-30 MED FILL — Heparin Sodium (Porcine) Inj 1000 Unit/ML: INTRAMUSCULAR | Qty: 30 | Status: AC

## 2019-01-30 MED FILL — Sodium Chloride Irrigation Soln 0.9%: Qty: 3000 | Status: AC

## 2019-01-30 MED FILL — Sodium Chloride IV Soln 0.9%: INTRAVENOUS | Qty: 1000 | Status: AC

## 2019-01-30 NOTE — Progress Notes (Signed)
Rehab Admissions Coordinator Note:  Patient was screened by Trish Mage for appropriateness for an Inpatient Acute Rehab Consult.  At this time, we are recommending Inpatient Rehab consult.  Lelon Frohlich M 01/30/2019, 8:05 AM  I can be reached at [phone].

## 2019-01-30 NOTE — Progress Notes (Signed)
ANTICOAGULATION CONSULT NOTE  Pharmacy Consult:  Heparin Indication: atrial fibrillation  No Known Allergies  Patient Measurements: Height: 6\' 5"  (195.6 cm) Weight: 188 lb 7.9 oz (85.5 kg) IBW/kg (Calculated) : 89.1  Heparin dosing weight = 84 kg  Vital Signs: Temp: 98.4 F (36.9 C) (02/12 0749) Temp Source: Oral (02/12 0749) BP: 120/79 (02/12 0749) Pulse Rate: 129 (02/12 0749)  Labs: Recent Labs    01/28/19 0624  01/28/19 1537 01/29/19 0209 01/29/19 1543 01/30/19 0443  HGB  --    < > 13.7 13.3  --  12.9*  HCT  --    < > 42.8 41.2  --  40.4  PLT  --   --  153 150  --  145*  APTT 29  --  34  --   --   --   LABPROT 14.7  --   --   --   --   --   INR 1.16  --   --   --   --   --   HEPARINUNFRC  --   --   --   --  0.32 0.37  CREATININE  --   --  1.38* 1.40*  --  1.39*   < > = values in this interval not displayed.    Estimated Creatinine Clearance: 60.7 mL/min (A) (by C-G formula based on SCr of 1.39 mg/dL (H)).  Assessment: 44 YOM s/p aortabifem bypass to continue on IV heparin for history of Afib Heparin level is therapeutic and low normal.  No bleeding reported.  Warfarin home dose resumed - > 1.5 mg po daily  Goal of Therapy:  Heparin level 0.3-0.7 units/ml Monitor platelets by anticoagulation protocol: Yes   Plan:  Continue heparin drip at 1250 units / hr Warfarin 1.5 mg po daily Daily heparin level, CBC, INR  Thank you Okey Regal, PharmD [phone] 01/30/2019, 9:04 AM

## 2019-01-30 NOTE — Progress Notes (Signed)
**Note [name]-Identified via Obfuscation** PT Cancellation Note  Patient Details Name: DEMORRIS JANIS MRN: [id number] DOB: 16-Jan-1951   Cancelled Treatment:    Reason Eval/Treat Not Completed: Patient declined, no reason specified. Pt refusing out of bed mobility secondary to abdominal pain despite extensive education on importance of mobility and activity progression. Also refused offer of bed level exercises.   Laurina Bustle, PT, DPT Acute Rehabilitation Services Pager [phone] Office [phone]    [name] [name] 01/30/2019, 3:06 PM

## 2019-01-30 NOTE — Progress Notes (Signed)
[facility] [place]  Subjective  - feels ok + flatus   Objective 120/79 (!) 129 98.4 F (36.9 C) (Oral) (!) 22 95%  Intake/Output Summary (Last 24 hours) at 01/30/2019 9163 Last data filed at 01/30/2019 0300 Gross per 24 hour  Intake 1652.73 ml  Output 950 ml  Net 702.73 ml   Incisions without drainage Feet warm  Assessment/Planning: Still with bursts of rapid afib Will start clears and resume preop oral meds to try to improve rate control If still poorly controlled tomorrow will involve cardiology  Doing well from aortobifem ileus resolving advance diet   Afib rate control.  Resume home warfarin dose today.  [name] 01/30/2019 8:21 AM --  Laboratory Lab Results: Recent Labs    01/29/19 0209 01/30/19 0443  WBC 15.6* 15.4*  HGB 13.3 12.9*  HCT 41.2 40.4  PLT 150 145*   BMET Recent Labs    01/29/19 0209 01/30/19 0443  NA 138 135  K 4.4 4.1  CL 106 101  CO2 21* 23  GLUCOSE 125* 115*  BUN 15 15  CREATININE 1.40* 1.39*  CALCIUM 8.3* 8.1*    COAG Lab Results  Component Value Date   INR 1.16 01/28/2019   INR 2.38 01/22/2019   INR 2.9 07/18/2018   No results found for: PTT

## 2019-01-30 NOTE — Progress Notes (Signed)
Occupational Therapy Treatment Patient Details Name: [name] [name] MRN: [id number] DOB: Apr 14, 1951 Today's Date: 01/30/2019    History of present illness Pt s/p Aortobifemoral bypass on 2/10. 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    OT comments  Pt with increased lethargy and decreased mobility today. RN aware of tachy HR up to 134 BPM at rest and 118BPm upon exertion to sitting up in bed maintaining <120 BPM with exertion. Pt requiring MaxA +2 for bed mobility for trunk elevation and stabilizing in sitting- BUEs required for stability, poor sitting balance. Pt reports discomfort in incision. Pt moving BLEs minimally to EOB. Pt nearly dependent +2 for sitting to supine. Pt continues to have expressive aphasia and unable to fully communicate well. Pt's POA in room. Pt unsafe for progressive mobility today. Pt greatly requires continued OT skilled services acutely and post acutely in [name] setting. OT to progress to transfers and any ADL task seated.    Follow Up Recommendations  [name];Supervision/Assistance - 24 hour    Equipment Recommendations       Recommendations for Other Services      Precautions / Restrictions Precautions Precautions: Fall;Other (comment)(ortho monitors) Restrictions Weight Bearing Restrictions: No       Mobility Bed Mobility Overal bed mobility: Needs Assistance Bed Mobility: Sit to Supine Rolling: Max assist;+2 for physical assistance Sidelying to sit: Max assist;+2 for physical assistance;HOB elevated   Sit to supine: Max assist;+2 for physical assistance;HOB elevated   General bed mobility comments: Assist to sit upright as pt feeling weak and wanting to lay back down  Transfers Overall transfer level: Needs assistance               General transfer comment: unable to test as pt unable to sit upright without leaning    Balance Overall balance assessment: Needs assistance Sitting-balance support:  Bilateral upper extremity supported Sitting balance-Leahy Scale: Poor                                     ADL either performed or assessed with clinical judgement   ADL Overall ADL's : Needs assistance/impaired                                     Functional mobility during ADLs: +2 for physical assistance;Maximal assistance(for sitting EOB; pt's pain in chest increased and HR in afib) General ADL Comments: Pt presenting with decreasesd strength, balance, and activity tolerance. Highly motivated and wanting to participate despite pain.      Vision       Perception     Praxis      Cognition Arousal/Alertness: Awake/alert Behavior During Therapy: WFL for tasks assessed/performed Overall Cognitive Status: Difficult to assess                                 General Comments: Expressive Aphasia at baseline        Exercises     Shoulder Instructions       General Comments      Pertinent Vitals/ Pain       Pain Assessment: Faces Faces Pain Scale: Hurts even more Pain Location: Abdomen Pain Descriptors / Indicators: Constant;Grimacing;Discomfort Pain Intervention(s): Limited activity within patient's tolerance  Home Living  Prior Functioning/Environment              Frequency  Min 2X/week        Progress Toward Goals  OT Goals(current goals can now be found in the care plan section)  Progress towards OT goals: Progressing toward goals  Acute Rehab OT Goals Patient Stated Goal: walk OT Goal Formulation: With patient Time For Goal Achievement: 02/12/19 Potential to Achieve Goals: Good ADL Goals Pt Will Perform Grooming: with modified independence;standing Pt Will Perform Lower Body Dressing: with modified independence;with adaptive equipment;sit to/from stand Pt Will Transfer to Toilet: with modified independence;bedside commode;ambulating Pt Will  Perform Toileting - Clothing Manipulation and hygiene: with modified independence;sit to/from stand;sitting/lateral leans Additional ADL Goal #1: Pt will perform bed mobility with supervision in preparation for ADLs  Plan Discharge plan remains appropriate    Co-evaluation                 AM-PAC OT "6 Clicks" Daily Activity     Outcome Measure   Help from another person eating meals?: Total Help from another person taking care of personal grooming?: A Lot Help from another person toileting, which includes using toliet, bedpan, or urinal?: A Lot Help from another person bathing (including washing, rinsing, drying)?: Total Help from another person to put on and taking off regular upper body clothing?: A Lot Help from another person to put on and taking off regular lower body clothing?: Total 6 Click Score: 9    End of Session    OT Visit Diagnosis: Unsteadiness on feet (R26.81);Other abnormalities of gait and mobility (R26.89);Muscle weakness (generalized) (M62.81)   Activity Tolerance Patient limited by pain;Patient limited by lethargy;Treatment limited secondary to medical complications (Comment)   Patient Left in bed;with call bell/phone within reach;with family/visitor present;with bed alarm set   Nurse Communication Mobility status        Time: 6144-3154 OT Time Calculation (min): 21 min  Charges: OT General Charges $OT Visit: 1 Visit OT Treatments $Neuromuscular Re-education: 8-22 mins   Cristi Loron) Glendell Docker OTR/L Acute Rehabilitation Services Pager: [phone] Office: [phone]    Sandrea Hughs 01/30/2019, 1:30 PM

## 2019-01-31 ENCOUNTER — Inpatient Hospital Stay (HOSPITAL_COMMUNITY): Payer: Medicare HMO

## 2019-01-31 DIAGNOSIS — N179 Acute kidney failure, unspecified: Secondary | ICD-10-CM

## 2019-01-31 DIAGNOSIS — I480 Paroxysmal atrial fibrillation: Secondary | ICD-10-CM

## 2019-01-31 DIAGNOSIS — Z9889 Other specified postprocedural states: Secondary | ICD-10-CM

## 2019-01-31 LAB — CBC
HCT: 36.3 % — ABNORMAL LOW (ref 39.0–52.0)
HCT: 32.1 % — ABNORMAL LOW (ref 39.0–52.0)
Hemoglobin: 11.9 g/dL — ABNORMAL LOW (ref 13.0–17.0)
Hemoglobin: 10.5 g/dL — ABNORMAL LOW (ref 13.0–17.0)
MCH: 30.7 pg (ref 26.0–34.0)
MCH: 30.8 pg (ref 26.0–34.0)
MCHC: 32.8 g/dL (ref 30.0–36.0)
MCHC: 32.7 g/dL (ref 30.0–36.0)
MCV: 93.8 fL (ref 80.0–100.0)
MCV: 94.1 fL (ref 80.0–100.0)
Platelets: 162 10*3/uL (ref 150–400)
Platelets: 137 10*3/uL — ABNORMAL LOW (ref 150–400)
RBC: 3.87 MIL/uL — ABNORMAL LOW (ref 4.22–5.81)
RBC: 3.41 MIL/uL — ABNORMAL LOW (ref 4.22–5.81)
RDW: 14.4 % (ref 11.5–15.5)
RDW: 14.6 % (ref 11.5–15.5)
WBC: 12 10*3/uL — ABNORMAL HIGH (ref 4.0–10.5)
WBC: 13 10*3/uL — ABNORMAL HIGH (ref 4.0–10.5)
nRBC: 0 % (ref 0.0–0.2)
nRBC: 0 % (ref 0.0–0.2)

## 2019-01-31 LAB — BASIC METABOLIC PANEL
Anion gap: 9 (ref 5–15)
Anion gap: 10 (ref 5–15)
BUN: 29 mg/dL — ABNORMAL HIGH (ref 8–23)
BUN: 45 mg/dL — ABNORMAL HIGH (ref 8–23)
CO2: 22 mmol/L (ref 22–32)
CO2: 18 mmol/L — ABNORMAL LOW (ref 22–32)
Calcium: 7.8 mg/dL — ABNORMAL LOW (ref 8.9–10.3)
Calcium: 7.5 mg/dL — ABNORMAL LOW (ref 8.9–10.3)
Chloride: 104 mmol/L (ref 98–111)
Chloride: 106 mmol/L (ref 98–111)
Creatinine, Ser: 2.67 mg/dL — ABNORMAL HIGH (ref 0.61–1.24)
Creatinine, Ser: 3.94 mg/dL — ABNORMAL HIGH (ref 0.61–1.24)
GFR calc Af Amer: 27 mL/min — ABNORMAL LOW (ref >60)
GFR calc Af Amer: 17 mL/min — ABNORMAL LOW (ref >60)
GFR calc non Af Amer: 23 mL/min — ABNORMAL LOW (ref >60)
GFR calc non Af Amer: 15 mL/min — ABNORMAL LOW (ref >60)
Glucose, Bld: 148 mg/dL — ABNORMAL HIGH (ref 70–99)
Glucose, Bld: 134 mg/dL — ABNORMAL HIGH (ref 70–99)
Potassium: 4.6 mmol/L (ref 3.5–5.1)
Potassium: 5.1 mmol/L (ref 3.5–5.1)
Sodium: 135 mmol/L (ref 135–145)
Sodium: 134 mmol/L — ABNORMAL LOW (ref 135–145)

## 2019-01-31 LAB — PROTIME-INR
INR: 1.27
Prothrombin Time: 15.8 seconds — ABNORMAL HIGH (ref 11.4–15.2)

## 2019-01-31 LAB — TROPONIN I
Troponin I: <0.03 ng/mL (ref <0.03)
Troponin I: <0.03 ng/mL (ref <0.03)
Troponin I: <0.03 ng/mL (ref <0.03)

## 2019-01-31 LAB — HEPARIN LEVEL (UNFRACTIONATED): HEPARIN UNFRACTIONATED: 0.74 [IU]/mL — AB (ref 0.30–0.70)

## 2019-01-31 MED ORDER — SODIUM CHLORIDE 0.9 % IV SOLN
Freq: Once | INTRAVENOUS | Status: AC
  Administered 2019-01-31: 18:00:00 via INTRAVENOUS

## 2019-01-31 MED ORDER — FUROSEMIDE 10 MG/ML IJ SOLN
INTRAMUSCULAR | Status: AC
  Filled 2019-01-31: qty 2

## 2019-01-31 MED ORDER — SODIUM BICARBONATE 8.4 % IV SOLN
INTRAVENOUS | Status: DC
  Administered 2019-01-31 – 2019-02-02 (×3): via INTRAVENOUS
  Filled 2019-01-31 (×5): qty 150

## 2019-01-31 MED ORDER — FUROSEMIDE 10 MG/ML IJ SOLN
20.0000 mg | Freq: Once | INTRAMUSCULAR | Status: AC
  Administered 2019-01-31: 20 mg via INTRAVENOUS

## 2019-01-31 MED ORDER — SODIUM CHLORIDE 0.9 % IV SOLN
Freq: Once | INTRAVENOUS | Status: AC
  Administered 2019-01-31: 15:00:00 via INTRAVENOUS

## 2019-01-31 NOTE — Progress Notes (Signed)
Spoke with pt nurse.  Pt only making about 10 cc/hr of urine.   Will bolus with 1 liter normal saline now  Will repeat bolus if urine output still less than 1 cc/kg/hr  [name] MD

## 2019-01-31 NOTE — Progress Notes (Signed)
Patient's heart rate increased to 160's, oxygen decreased to 80's on room air, 15L on a non-rebreather mask. Notified Dr. Arbie Cookey received orders for IV lasix 20 mg once, stat chest xray, decrease IV fluids to [place], and to transfer the patient to 2 Heart. Received and entered orders, will continue to monitor.

## 2019-01-31 NOTE — Progress Notes (Addendum)
Abdomen is soft though distended.  He is not acting like compartment syndrome  Pt with abdominal xray consistant with ileus Will place NG tube  He is essentially anuric at this point Will recheck BMET and continue IV fluid hydration May need HD if renal failure does not improve  Hemoglobin has dropped from 13 to 10 over last 24 hours will stop warfarin and heparin.  SCDs for DVT for now risk of bleeding higher than afib embolization at this point.  Will resume heparin when Hgb more stable.  CBC    Component Value Date/Time   WBC 13.0 (H) 01/31/2019 1702   RBC 3.41 (L) 01/31/2019 1702   HGB 10.5 (L) 01/31/2019 1702   HGB 15.7 09/27/2017 0930   HCT 32.1 (L) 01/31/2019 1702   HCT 46.8 09/27/2017 0930   PLT 137 (L) 01/31/2019 1702   PLT 275 09/27/2017 0930   MCV 94.1 01/31/2019 1702   MCV 94 09/27/2017 0930   MCH 30.8 01/31/2019 1702   MCHC 32.7 01/31/2019 1702   RDW 14.6 01/31/2019 1702   RDW 14.8 09/27/2017 0930   LYMPHSABS 1.4 06/20/2018 1816   MONOABS 0.7 06/20/2018 1816   EOSABS 0.2 06/20/2018 1816   BASOSABS 0.1 06/20/2018 1816   BMET pending  NG put out one liter Will type and screen in the event he needs transfusion  [name], MD [facility] Office: [phone] Pager: [phone]

## 2019-01-31 NOTE — Progress Notes (Signed)
ABI has been completed.   Preliminary results in CV Proc.   Blanch Media 01/31/2019 11:10 AM

## 2019-01-31 NOTE — Consult Note (Signed)
Cardiology Consultation:   Patient ID: [name] MRN: [id number]; DOB: [birth year]  Admit date: 01/28/2019 Date of Consult: 01/31/2019  Primary Care Provider: Toma Deiters, MD Primary Cardiologist: Rollene Rotunda, MD  Primary Electrophysiologist:  None    Patient Profile:   [name] [name] is a 68 y.o. male with a hx of atrial fibrillation and tachy-brady syndrome who has refused PPM in the past, chronic anticoagulation therapy w/ Coumadin, h/o CVA,  CKD and PVD admitted for elective Bi-Femoral bypass, who is being seen today for the evaluation of atrial fibrillation w/ RVR at the request of Dr. Darrick Penna, Vascular Surgery.   History of Present Illness:   [name] [name] is a 68 y/o male with a h/o atrial fib and tachy-brady syndrome. Per clinic notes, he has refused PPM. He was unable to tolerate amiodarone due to bradycardia. He has been able to tolerate however digoxin and metoprolol. He is on coumadin for anticoagulation. Additional medical problems include PVD, s/pbilateral fasciotomy andembolectomyin 08/2017, prior CVA and CKD.   He was admitted for Bi-Femoral bypass on 2/10. Surgery preformed by Dr. Darrick Penna. Today is POD #3. Cardiology consulted for atrial fibrillation/flutter w/ RVR.   In addition to postoperative atrial fibrillation, pt also with AKI w/ SCr now at 2.67 (up from 1.39 overnight). BUN 29. Also hypotensive w/ SBPs in the 80s-90s. Hgb 11.9. WBC 12. Afebrile. Troponin's negative x 2. Renal artery duplex was attempted. Due to excessive bowel gas and fluid filled bowel, unable to visualize kidneys. No diagnostic images obtained. UOP has decreased.  Pt only making about 10 cc/hr of urine.   Current Vrates in the low 100s-110s on tele. BP improved with IVFs, now at 107/87.    Past Medical History:  Diagnosis Date  . Afib (HCC)    a. Paroxysmal --> previous issues with bradycardia while on Amiodarone and he refused PPM placement. Rate-control strategy pursued.     . Aortic stenosis   . Chronic kidney disease    unknown problem to see kidney  doctor in March in [place]  . Dysrhythmia    Atril Fib  . Peripheral vascular disease (HCC)   . Stroke [facility])    Aphasia, right sided weakness     Past Surgical History:  Procedure Laterality Date  . AORTA - BILATERAL FEMORAL ARTERY BYPASS GRAFT Bilateral 01/28/2019   Procedure: AORTA BIFEMORAL BYPASS GRAFT;  Surgeon: Sherren Kerns, MD;  Location: [facility] OR;  Service: Vascular;  Laterality: Bilateral;  . AORTA BIFEMORAL BYPASS GRAFT  01/28/2019  . EMBOLECTOMY Right 08/30/2017   Procedure: Right popliteal tibial endarectomy with patch angioplasty;  Surgeon: Sherren Kerns, MD;  Location: [facility] OR;  Service: Vascular;  Laterality: Right;  . ENDARTERECTOMY FEMORAL Bilateral 01/28/2019   Procedure: BILATERAL FEMORAL ENDARTERECTOMY WITH PROFUNDAPLASTY;  Surgeon: Sherren Kerns, MD;  Location: [facility] OR;  Service: Vascular;  Laterality: Bilateral;  . FASCIOTOMY CLOSURE Bilateral 08/28/2017   Procedure: FASCIOTOMY CLOSURE/BILATERAL  lower legs;  Surgeon: Sherren Kerns, MD;  Location: [facility] OR;  Service: Vascular;  Laterality: Bilateral;  . FEMORAL-POPLITEAL BYPASS GRAFT Bilateral 08/26/2017   Procedure: Bilateral FEMORAL EMBOLECTOMY with  BILATERAL Four Compartment  FASCIOTOMIES.;  Surgeon: Sherren Kerns, MD;  Location: [facility] OR;  Service: Vascular;  Laterality: Bilateral;  . FEMORAL-POPLITEAL BYPASS GRAFT Bilateral 01/28/2019   Procedure: BILATERAL  ILIAC THROMBECTOMY;  Surgeon: Sherren Kerns, MD;  Location: [facility] OR;  Service: Vascular;  Laterality: Bilateral;     Home Medications:  Prior to Admission medications  Medication Sig Start Date End Date Taking? Authorizing Provider  aspirin 325 MG tablet Take 325 mg by mouth daily.   Yes [provider]  atorvastatin (LIPITOR) 20 MG tablet Take 1 tablet (20 mg total) by mouth daily. 01/22/19 04/22/19 Yes Jodelle Gross, NP  metoprolol tartrate (LOPRESSOR)  25 MG tablet Take 25 mg by mouth daily.   Yes [provider]  warfarin (COUMADIN) 1 MG tablet Take 1.5 mg by mouth daily.   Yes [provider]    Inpatient Medications: Scheduled Meds: . aspirin  325 mg Oral Daily  . atorvastatin  20 mg Oral Daily  . docusate sodium  100 mg Oral Daily  . metoprolol tartrate  25 mg Oral Daily  . pantoprazole (PROTONIX) IV  40 mg Intravenous Daily  . sodium chloride flush  10-40 mL Intracatheter Q12H  . warfarin  1.5 mg Oral q1800  . Warfarin - Physician Dosing Inpatient   Does not apply q1800   Continuous Infusions: . sodium chloride    . sodium chloride 10 mL/hr at 01/31/19 1000  . diltiazem (CARDIZEM) infusion 5 mg/hr (01/31/19 1303)  . heparin 1,150 Units/hr (01/31/19 1000)   PRN Meds: sodium chloride, acetaminophen **OR** acetaminophen, alum & mag hydroxide-simeth, bisacodyl, guaiFENesin-dextromethorphan, hydrALAZINE, HYDROmorphone (DILAUDID) injection, labetalol, ondansetron, phenol, potassium chloride, sodium chloride flush  Allergies:   No Known Allergies  Social History:   Social History   Socioeconomic History  . Marital status: Single    Spouse name: Not on file  . Number of children: Not on file  . Years of education: Not on file  . Highest education level: Not on file  Occupational History  . Not on file  Social Needs  . Financial resource strain: Not on file  . Food insecurity:    Worry: Not on file    Inability: Not on file  . Transportation needs:    Medical: Not on file    Non-medical: Not on file  Tobacco Use  . Smoking status: Never Smoker  . Smokeless tobacco: Never Used  Substance and Sexual Activity  . Alcohol use: No  . Drug use: No  . Sexual activity: Not on file  Lifestyle  . Physical activity:    Days per week: Not on file    Minutes per session: Not on file  . Stress: Not on file  Relationships  . Social connections:    Talks on phone: Not on file    Gets together: Not on file     Attends religious service: Not on file    Active member of club or organization: Not on file    Attends meetings of clubs or organizations: Not on file    Relationship status: Not on file  . Intimate partner violence:    Fear of current or ex partner: Not on file    Emotionally abused: Not on file    Physically abused: Not on file    Forced sexual activity: Not on file  Other Topics Concern  . Not on file  Social History Narrative  . Not on file    Family History:    Family History  Problem Relation Age of Onset  . Stroke Sister      ROS:  Please see the history of present illness.   All other ROS reviewed and negative.     Physical Exam/Data:   Vitals:   01/31/19 1300 01/31/19 1320 01/31/19 1400 01/31/19 1500  BP:  (!) 89/61 91/68 100/78  Pulse:  Resp: (!) 22 (!) 30 20 (!) 23  Temp:      TempSrc:      SpO2: (!) 89% 95% 97% 98%  Weight:      Height:        Intake/Output Summary (Last 24 hours) at 01/31/2019 1540 Last data filed at 01/31/2019 1530 Gross per 24 hour  Intake 2473.41 ml  Output 360 ml  Net 2113.41 ml   Last 3 Weights 01/31/2019 01/30/2019 01/28/2019  Weight (lbs) 193 lb 2 oz 188 lb 7.9 oz 186 lb  Weight (kg) 87.6 kg 85.5 kg 84.369 kg     Body mass index is 22.9 kg/m.  General:  Well nourished, well developed, in no acute distressHEENT: normal Lymph: no adenopathy Neck: no JVD Endocrine:  No thryomegaly Vascular: No carotid bruits; FA pulses 2+ bilaterally without bruits  Cardiac:  irregularly irregular, tachy rate Lungs:  clear to auscultation bilaterally, no wheezing, rhonchi or rales  Abd: soft, nontender, no hepatomegaly  Ext: no edema Musculoskeletal:  No deformities, BUE and BLE strength normal and equal Skin: warm and dry  Neuro:  CNs 2-12 intact, no focal abnormalities noted Psych:  Normal affect   EKG:  The EKG was personally reviewed and demonstrates:  Atrial fibrillation  Telemetry:  Telemetry was personally reviewed and  demonstrates:  Atrial fibrillation, low 100s-110s   Relevant CV Studies: 2D Echo 08/2017 Study Conclusions  - Left ventricle: The cavity size was normal. Wall thickness was   normal. Systolic function was normal. The estimated ejection   fraction was 55%. Wall motion was normal; there were no regional   wall motion abnormalities. Left ventricular diastolic function   parameters were normal. - Aortic valve: Trileaflet; mildly thickened leaflets. There was   trivial regurgitation. - Aorta: Mild aortic root dilatation. Aortic root dimension: 39 mm   (ED). - Mitral valve: There was mild regurgitation. - Left atrium: The atrium was mildly dilated. - Atrial septum: No defect or patent foramen ovale was identified. - Pulmonic valve: There was mild regurgitation.   Laboratory Data:  Chemistry Recent Labs  Lab 01/29/19 0209 01/30/19 0443 01/31/19 0556  NA 138 135 135  K 4.4 4.1 4.6  CL 106 101 104  CO2 21* 23 22  GLUCOSE 125* 115* 148*  BUN 15 15 29*  CREATININE 1.40* 1.39* 2.67*  CALCIUM 8.3* 8.1* 7.8*  GFRNONAA 51* 51* 23*  GFRAA 59* 60* 27*  ANIONGAP 11 11 9     Recent Labs  Lab 01/29/19 0209  PROT 5.7*  ALBUMIN 3.3*  AST 41  ALT 35  ALKPHOS 35*  BILITOT 2.5*   Hematology Recent Labs  Lab 01/29/19 0209 01/30/19 0443 01/31/19 0556  WBC 15.6* 15.4* 12.0*  RBC 4.46 4.27 3.87*  HGB 13.3 12.9* 11.9*  HCT 41.2 40.4 36.3*  MCV 92.4 94.6 93.8  MCH 29.8 30.2 30.7  MCHC 32.3 31.9 32.8  RDW 14.1 14.7 14.4  PLT 150 145* 162   Cardiac Enzymes Recent Labs  Lab 01/31/19 0835 01/31/19 1316  TROPONINI <0.03 <0.03   No results for input(s): TROPIPOC in the last 168 hours.  BNPNo results for input(s): BNP, PROBNP in the last 168 hours.  DDimer No results for input(s): DDIMER in the last 168 hours.  Radiology/Studies:  Dg Chest Port 1 View  Result Date: 01/31/2019 CLINICAL DATA:  Shortness of breath and weakness EXAM: PORTABLE CHEST 1 VIEW COMPARISON:  01/29/2019  FINDINGS: Shallow lung inflation with right basilar atelectasis. No focal consolidation. No pleural effusion  or pneumothorax. Cardiomediastinal contours are normal. Enteric tube and right IJ central venous catheter have been removed. IMPRESSION: No active disease. Electronically Signed   By: Deatra RobinsonKevin  Herman M.D.   On: 01/31/2019 04:13   Dg Chest Port 1 View  Result Date: 01/29/2019 CLINICAL DATA:  68 year old male with history of chest soreness. EXAM: PORTABLE CHEST 1 VIEW COMPARISON:  Chest x-ray 01/28/2019. FINDINGS: There is a right-sided internal jugular central venous catheter with tip terminating in the mid superior vena cava. Lung volumes are low. No consolidative airspace disease. Ill-defined bibasilar opacities likely reflect areas of mild subsegmental atelectasis. No pleural effusions. No evidence of pulmonary edema. Mild cardiomegaly. Upper mediastinal contours are within normal limits. Aortic atherosclerosis. IMPRESSION: 1. Support apparatus, as above. 2. Low lung volumes with bibasilar subsegmental atelectasis. Electronically Signed   By: Trudie Reedaniel  Entrikin M.D.   On: 01/29/2019 08:56   Dg Chest Port 1 View  Result Date: 01/28/2019 CLINICAL DATA:  Central line placement. EXAM: PORTABLE CHEST 1 VIEW COMPARISON:  Chest CT, 01/10/2019.  Chest radiographs, 06/20/2018. FINDINGS: Right internal jugular central venous line catheter tip projects in the mid superior vena cava. No pneumothorax. Nasal/orogastric tube passes below the diaphragm, into the stomach and below the included field of view. Cardiac silhouette is borderline enlarged. No mediastinal or hilar masses. Lungs are clear. IMPRESSION: 1. Right internal jugular central venous line tip projects in the mid superior vena cava. No pneumothorax. 2. Well-positioned nasal/orogastric tube passing well into the stomach. 3. No acute cardiopulmonary disease. Electronically Signed   By: Amie Portlandavid  Ormond M.D.   On: 01/28/2019 16:25   Vas [place] Vanice Sarahbi With/wo  Tbi  Result Date: 01/31/2019 LOWER EXTREMITY DOPPLER STUDY Indications: Post op.  Performing Technologist: Blanch MediaMegan Riddle RVS  Examination Guidelines: A complete evaluation includes at minimum, Doppler waveform signals and systolic blood pressure reading at the level of bilateral brachial, anterior tibial, and posterior tibial arteries, when vessel segments are accessible. Bilateral testing is considered an integral part of a complete examination. Photoelectric Plethysmograph (PPG) waveforms and toe systolic pressure readings are included as required and additional duplex testing as needed. Limited examinations for reoccurring indications may be performed as noted.  ABI Findings: +--------+------------------+-----+-------------------+--------+ Right   Rt Pressure (mmHg)IndexWaveform           Comment  +--------+------------------+-----+-------------------+--------+ [id number]                    triphasic                   +--------+------------------+-----+-------------------+--------+ PTA     67                0.63 dampened monophasic         +--------+------------------+-----+-------------------+--------+ DP                             absent                      +--------+------------------+-----+-------------------+--------+ +--------+------------------+-----+-------------------+-------+ Left    Lt Pressure (mmHg)IndexWaveform           Comment +--------+------------------+-----+-------------------+-------+ Brachial                       triphasic                  +--------+------------------+-----+-------------------+-------+ PTA     68  0.64 dampened monophasic        +--------+------------------+-----+-------------------+-------+ DP      73                0.68 dampened monophasic        +--------+------------------+-----+-------------------+-------+ +-------+-----------+-----------+------------+------------+ ABI/TBIToday's ABIToday's  TBIPrevious ABIPrevious TBI +-------+-----------+-----------+------------+------------+ Right  0.63                                           +-------+-----------+-----------+------------+------------+ Left   0.68                                           +-------+-----------+-----------+------------+------------+  Summary: Right: Resting right ankle-brachial index indicates moderate right lower extremity arterial disease. Left: Resting left ankle-brachial index indicates moderate left lower extremity arterial disease.  *See table(s) above for measurements and observations.  Electronically signed by Sherald Hesshristopher Clark MD on 01/31/2019 at 2:52:37 PM.   Final    Vas [place] Renal Artery Duplex  Result Date: 01/31/2019 ABDOMINAL VISCERAL Indications: Acute renal failure Limitations: Air/bowel gas. Performing Technologist: Jeb LeveringJill Parker RDMS, RVT  Examination Guidelines: A complete evaluation includes B-mode imaging, spectral Doppler, color Doppler, and power Doppler as needed of all accessible portions of each vessel. Bilateral testing is considered an integral part of a complete examination. Limited examinations for reoccurring indications may be performed as noted.  Duplex Findings:  Mesenteric Technologist observations: Aorta not visualized.  Technologist observations Renal Artery(s):Bilateral kidneys not visualized due to excessive overlying bowel gas and fluid filled bowel.  Summary: Renal:   Inconclusive study, see above.  *See table(s) above for measurements and observations.  Diagnosing physician: Sherald Hesshristopher Clark MD  Electronically signed by Sherald Hesshristopher Clark MD on 01/31/2019 at 2:51:48 PM.    Final     Assessment and Plan:   [name] Croakichard P Finnicum is a 68 y.o. male with a hx of atrial fibrillation and tachy-brady syndrome who has refused PPM in the past, chronic anticoagulation therapy w/ Coumadin, h/o CVA,  CKD and PVD admitted for elective Bi-Femoral bypass, who is being seen today for the  evaluation of atrial fibrillation w/ RVR at the request of Dr. Darrick PennaFields, Vascular Surgery.   1. PVD: s/p elective Bi-Femoral Bypass. POD #3. Management per vascular surgery.   2. Atrial Fibrillation: known history as outlined above. He was seen in our clinic on 2/4 for preop evaluation prior to surgery and was in NSR. He was continued on metoprolol. Now back in afib w/ RVR in the 110s, post operatively and also in the setting of AKI. Continue on IV dilt and PO metoprolol. Monitor BP closely. Continue IV heparin and IVFs. Will Order 2D echo.   3. AKI: significant increase in SCr overnight from 1.39>>2.67. UOP has decreased.  Pt only making about 10 cc/hr of urine. Renal artery duplex was attempted. Due to excessive bowel gas and fluid filled bowel, unable to visualize kidneys. No diagnostic images obtained. He is borderline hypotensive. SBPs in the 90s. He is currently getting a bolus of IVFs and BP is improving. F/u BMP in the AM.    For questions or updates, please contact [facility] Please consult [email or web address] for contact info under     Signed, Robbie LisBrittainy Tamaiya Bump, PA-C  01/31/2019 3:40 PM

## 2019-01-31 NOTE — Progress Notes (Signed)
ANTICOAGULATION CONSULT NOTE  Pharmacy Consult:  Heparin Indication: atrial fibrillation  No Known Allergies  Patient Measurements: Height: 6\' 5"  (195.6 cm) Weight: 193 lb 2 oz (87.6 kg) IBW/kg (Calculated) : 89.1  Heparin dosing weight = 84 kg  Vital Signs: Temp: 98.4 F (36.9 C) (02/13 0500) Temp Source: Axillary (02/13 0500) BP: 96/84 (02/13 0600) Pulse Rate: 95 (02/13 0500)  Labs: Recent Labs    01/28/19 1537 01/29/19 0209 01/29/19 1543 01/30/19 0443 01/31/19 0556  HGB 13.7 13.3  --  12.9* 11.9*  HCT 42.8 41.2  --  40.4 36.3*  PLT 153 150  --  145* 162  APTT 34  --   --   --   --   LABPROT  --   --   --   --  15.8*  INR  --   --   --   --  1.27  HEPARINUNFRC  --   --  0.32 0.37 0.74*  CREATININE 1.38* 1.40*  --  1.39* 2.67*    Estimated Creatinine Clearance: 32.4 mL/min (A) (by C-G formula based on SCr of 2.67 mg/dL (H)).  Assessment: 23 YOM s/p aortabifem bypass to continue on IV heparin for history of Afib Heparin level is above goal, CBC stable. Coumadin has been restarted per MD -heparin level= 0.74, INR= 1.27  Goal of Therapy:  Heparin level 0.3-0.7 units/ml Monitor platelets by anticoagulation protocol: Yes   Plan:  Decrease heparin to 1150 units/hr Daily heparin level, CBC, INR  Harland German, PharmD Clinical Pharmacist **Pharmacist phone directory can now be found on [email or web address] ([email or web address]).  Listed under [facility].

## 2019-01-31 NOTE — Progress Notes (Signed)
Physical medicine rehabilitation consult requested chart reviewed. Patient was transferred to the heart unit early morning of 01/31/2019 with heart rate in the 160s oxygen decreased into the 80s placed on nonrebreather mask. Continue to follow and complete rehabilitation consult once patient medically stable.

## 2019-01-31 NOTE — Progress Notes (Addendum)
Remains anuric after 3 L NaCl bolus earlier this afternoon.  Cr now 3.94, bicarb 18, K ok at 5.1.  Will start bicarb drip at 75 mL/hr with 3 amps bicarb after discussing with nephrology.  Probably will need CRRT tomorrow.  Recheck labs in am.  Cephus Shelling, MD [facility] [place] Office: [phone] Pager: [phone]  Cephus Shelling

## 2019-01-31 NOTE — Progress Notes (Signed)
[facility] [place]  Subjective  - feels ok   Objective 96/84 95 98.4 F (36.9 C) (Axillary) (!) 26 92%  Intake/Output Summary (Last 24 hours) at 01/31/2019 0747 Last data filed at 01/31/2019 0600 Gross per 24 hour  Intake 2443.82 ml  Output 550 ml  Net 1893.82 ml   Abdomen soft + flatus incision clean Extremites feet pink warm  Assessment/Planning: POD #3 s/p aortobifem Dyspnea overnight still bursts of rapid afib probably causing some decompensation. Will check troponin and 12 lead EKG will get cardiology to see to today to see if we can improve rate control.  He is back on his home meds  Afib needs better rate control and continue warfarin INR is rising  Dramatic acute renal failure overnight.  Will place foley to monitor urine output closer. Check renal ultrasound.  No real PO intake yesterday.  Will keep IV fluid for now  Advance diet  [name] 01/31/2019 7:47 AM --  Laboratory Lab Results: Recent Labs    01/30/19 0443 01/31/19 0556  WBC 15.4* 12.0*  HGB 12.9* 11.9*  HCT 40.4 36.3*  PLT 145* 162   BMET Recent Labs    01/30/19 0443 01/31/19 0556  NA 135 135  K 4.1 4.6  CL 101 104  CO2 23 22  GLUCOSE 115* 148*  BUN 15 29*  CREATININE 1.39* 2.67*  CALCIUM 8.1* 7.8*    COAG Lab Results  Component Value Date   INR 1.27 01/31/2019   INR 1.16 01/28/2019   INR 2.38 01/22/2019   No results found for: PTT

## 2019-01-31 NOTE — Progress Notes (Signed)
Notified Dr.Clark of Creatinine 3.94 and BUN 45, will continue to monitor and recheck in the morning per MD.

## 2019-01-31 NOTE — Progress Notes (Signed)
PT Cancellation Note  Patient Details Name: [name] [name] MRN: [id number] DOB: 1951/09/15   Cancelled Treatment:    Reason Eval/Treat Not Completed: Medical issues which prohibited therapy. Pt with resting HR of 120's.    Angelina Ok [facility] 01/31/2019, 3:22 PM Skip Mayer PT Acute Rehabilitation Services Pager [phone] Office [phone]

## 2019-01-31 NOTE — Plan of Care (Signed)
  Problem: Education: Goal: Will demonstrate proper wound care and an understanding of methods to prevent future damage Outcome: Progressing Goal: Knowledge of disease or condition will improve Outcome: Progressing Goal: Knowledge of the prescribed therapeutic regimen will improve Outcome: Progressing Goal: Individualized Educational Video(s) Outcome: Progressing   Problem: Activity: Goal: Risk for activity intolerance will decrease Outcome: Progressing   Problem: Cardiac: Goal: Will achieve and/or maintain hemodynamic stability Outcome: Progressing   Problem: Clinical Measurements: Goal: Postoperative complications will be avoided or minimized Outcome: Progressing   Problem: Skin Integrity: Goal: Wound healing without signs and symptoms of infection Outcome: Progressing Goal: Risk for impaired skin integrity will decrease Outcome: Progressing   Problem: Respiratory: Goal: Respiratory status will improve Outcome: Progressing   Problem: Urinary Elimination: Goal: Ability to achieve and maintain adequate renal perfusion and functioning will improve Outcome: Progressing   

## 2019-01-31 NOTE — Progress Notes (Signed)
Renal artery duplex was attempted. Due to excessive bowel gas and fluid filled bowel, unable to visualize kidneys. No diagnostic images obtained.  Please reorder if needed, once patient's bowel symptoms improve.  Jeb LeveringJill Jahna Liebert, BS, RDMS, RVT

## 2019-02-01 ENCOUNTER — Inpatient Hospital Stay (HOSPITAL_COMMUNITY): Payer: Medicare HMO

## 2019-02-01 ENCOUNTER — Telehealth: Payer: Self-pay

## 2019-02-01 DIAGNOSIS — N179 Acute kidney failure, unspecified: Secondary | ICD-10-CM

## 2019-02-01 DIAGNOSIS — I739 Peripheral vascular disease, unspecified: Secondary | ICD-10-CM

## 2019-02-01 DIAGNOSIS — I361 Nonrheumatic tricuspid (valve) insufficiency: Secondary | ICD-10-CM

## 2019-02-01 LAB — CBC
HCT: 29.1 % — ABNORMAL LOW (ref 39.0–52.0)
Hemoglobin: 9.3 g/dL — ABNORMAL LOW (ref 13.0–17.0)
MCH: 29.8 pg (ref 26.0–34.0)
MCHC: 32 g/dL (ref 30.0–36.0)
MCV: 93.3 fL (ref 80.0–100.0)
Platelets: 162 10*3/uL (ref 150–400)
RBC: 3.12 MIL/uL — ABNORMAL LOW (ref 4.22–5.81)
RDW: 14.6 % (ref 11.5–15.5)
WBC: 7.7 10*3/uL (ref 4.0–10.5)
nRBC: 0 % (ref 0.0–0.2)

## 2019-02-01 LAB — ECHOCARDIOGRAM COMPLETE
Height: 77 in
Weight: 3089.97 oz

## 2019-02-01 LAB — RENAL FUNCTION PANEL
Albumin: 2.5 g/dL — ABNORMAL LOW (ref 3.5–5.0)
Anion gap: 14 (ref 5–15)
BUN: 53 mg/dL — AB (ref 8–23)
CO2: 22 mmol/L (ref 22–32)
Calcium: 7.6 mg/dL — ABNORMAL LOW (ref 8.9–10.3)
Chloride: 101 mmol/L (ref 98–111)
Creatinine, Ser: 4.04 mg/dL — ABNORMAL HIGH (ref 0.61–1.24)
GFR calc Af Amer: 16 mL/min — ABNORMAL LOW (ref >60)
GFR calc non Af Amer: 14 mL/min — ABNORMAL LOW (ref >60)
Glucose, Bld: 125 mg/dL — ABNORMAL HIGH (ref 70–99)
Phosphorus: 3.2 mg/dL (ref 2.5–4.6)
Potassium: 3.9 mmol/L (ref 3.5–5.1)
Sodium: 137 mmol/L (ref 135–145)

## 2019-02-01 LAB — BASIC METABOLIC PANEL
Anion gap: 10 (ref 5–15)
BUN: 51 mg/dL — ABNORMAL HIGH (ref 8–23)
CO2: 20 mmol/L — ABNORMAL LOW (ref 22–32)
Calcium: 7.4 mg/dL — ABNORMAL LOW (ref 8.9–10.3)
Chloride: 105 mmol/L (ref 98–111)
Creatinine, Ser: 4.15 mg/dL — ABNORMAL HIGH (ref 0.61–1.24)
GFR calc non Af Amer: 14 mL/min — ABNORMAL LOW (ref >60)
GFR, EST AFRICAN AMERICAN: 16 mL/min — AB (ref >60)
Glucose, Bld: 133 mg/dL — ABNORMAL HIGH (ref 70–99)
Potassium: 4.5 mmol/L (ref 3.5–5.1)
Sodium: 135 mmol/L (ref 135–145)

## 2019-02-01 LAB — HEPARIN LEVEL (UNFRACTIONATED)
Heparin Unfractionated: <0.1 IU/mL — ABNORMAL LOW (ref 0.30–0.70)
Heparin Unfractionated: 0.18 IU/mL — ABNORMAL LOW (ref 0.30–0.70)

## 2019-02-01 LAB — PROTIME-INR
INR: 1.42
Prothrombin Time: 17.2 seconds — ABNORMAL HIGH (ref 11.4–15.2)

## 2019-02-01 MED ORDER — HEPARIN (PORCINE) 25000 UT/250ML-% IV SOLN
1800.0000 [IU]/h | INTRAVENOUS | Status: DC
  Administered 2019-02-01: 1000 [IU]/h via INTRAVENOUS
  Administered 2019-02-02 – 2019-02-03 (×2): 1200 [IU]/h via INTRAVENOUS
  Administered 2019-02-04: 1300 [IU]/h via INTRAVENOUS
  Administered 2019-02-04: 1550 [IU]/h via INTRAVENOUS
  Administered 2019-02-05 – 2019-02-08 (×6): 1700 [IU]/h via INTRAVENOUS
  Administered 2019-02-09: 1800 [IU]/h via INTRAVENOUS
  Administered 2019-02-09: 1700 [IU]/h via INTRAVENOUS
  Administered 2019-02-10 – 2019-02-13 (×7): 1800 [IU]/h via INTRAVENOUS
  Administered 2019-02-14 – 2019-02-23 (×15): 1650 [IU]/h via INTRAVENOUS
  Administered 2019-02-23: 1700 [IU]/h via INTRAVENOUS
  Administered 2019-02-25 – 2019-02-28 (×5): 1800 [IU]/h via INTRAVENOUS
  Filled 2019-02-01 (×42): qty 250

## 2019-02-01 MED ORDER — FUROSEMIDE 10 MG/ML IJ SOLN
INTRAMUSCULAR | Status: AC
  Filled 2019-02-01: qty 4

## 2019-02-01 MED ORDER — PHENYLEPHRINE HCL-NACL 10-0.9 MG/250ML-% IV SOLN
0.0000 ug/min | INTRAVENOUS | Status: DC
  Administered 2019-02-01: 20 ug/min via INTRAVENOUS
  Administered 2019-02-02: 50 ug/min via INTRAVENOUS
  Administered 2019-02-03: 20 ug/min via INTRAVENOUS
  Filled 2019-02-01 (×3): qty 250

## 2019-02-01 MED ORDER — SODIUM CHLORIDE 0.9 % IV BOLUS
1000.0000 mL | Freq: Once | INTRAVENOUS | Status: AC
  Administered 2019-02-01: 1000 mL via INTRAVENOUS

## 2019-02-01 MED ORDER — PANTOPRAZOLE SODIUM 40 MG IV SOLR
40.0000 mg | INTRAVENOUS | Status: DC
  Administered 2019-02-02 – 2019-02-25 (×24): 40 mg via INTRAVENOUS
  Filled 2019-02-01 (×24): qty 40

## 2019-02-01 MED ORDER — AMIODARONE LOAD VIA INFUSION
150.0000 mg | Freq: Once | INTRAVENOUS | Status: AC
  Administered 2019-02-01: 150 mg via INTRAVENOUS
  Filled 2019-02-01: qty 83.34

## 2019-02-01 MED ORDER — METOPROLOL TARTRATE 5 MG/5ML IV SOLN
2.5000 mg | Freq: Four times a day (QID) | INTRAVENOUS | Status: DC
  Administered 2019-02-02 – 2019-02-05 (×12): 2.5 mg via INTRAVENOUS
  Filled 2019-02-01 (×14): qty 5

## 2019-02-01 MED ORDER — ORAL CARE MOUTH RINSE
15.0000 mL | Freq: Two times a day (BID) | OROMUCOSAL | Status: DC
  Administered 2019-02-01 – 2019-02-26 (×43): 15 mL via OROMUCOSAL

## 2019-02-01 MED ORDER — AMIODARONE HCL IN DEXTROSE 360-4.14 MG/200ML-% IV SOLN
60.0000 mg/h | INTRAVENOUS | Status: AC
  Administered 2019-02-01 (×2): 60 mg/h via INTRAVENOUS
  Filled 2019-02-01 (×2): qty 200

## 2019-02-01 MED ORDER — FUROSEMIDE 10 MG/ML IJ SOLN
80.0000 mg | Freq: Once | INTRAMUSCULAR | Status: AC
  Administered 2019-02-01: 80 mg via INTRAVENOUS
  Filled 2019-02-01: qty 8

## 2019-02-01 MED ORDER — PHENYLEPHRINE HCL-NACL 10-0.9 MG/250ML-% IV SOLN
INTRAVENOUS | Status: AC
  Administered 2019-02-01: 20 ug/min via INTRAVENOUS
  Filled 2019-02-01: qty 250

## 2019-02-01 MED ORDER — AMIODARONE HCL IN DEXTROSE 360-4.14 MG/200ML-% IV SOLN
30.0000 mg/h | INTRAVENOUS | Status: DC
  Administered 2019-02-01 – 2019-02-04 (×7): 30 mg/h via INTRAVENOUS
  Filled 2019-02-01 (×7): qty 200

## 2019-02-01 NOTE — Progress Notes (Addendum)
2119: Paged cardiology regarding pt HR up to 150 in Afib. Pt currently on 30mg  amiodarone infusion and heparin gtt. BP 95/61 (71). Awaiting return call.   2140: Discussed pt condition with Dr. Aron Baba, of cardiology. Discussed possibility of other amio load, but concern of respiratory distress. Discussed dig load but concern of worsening kidney function. No answer as to whether CRRT will be initiated at this point. Only access is IV as pt has post op ileus and NGT is to LIS. Decision was made to rate control using very low dose metoprolol. If pt tolerates, will page cardiology back to increase dose, if pt does not tolerate may need to DCCV. Orders were placed by physician. Will continue to monitor pt closely.   2245: Paged Dr. Aron Baba regarding pt BP 80/67(76) after NS bolus. Orders given to start phenylephrine with SBP goal >90 and MAP goal >65 to ensure kidney perfusion.   2230: Updated Dr. Darrick Penna on events so far. Orders given for CBC now and call back with results.  0030: Paged Dr. Darrick Penna regarding CBC. Orders given for [facility]. MD placing orders.

## 2019-02-01 NOTE — Progress Notes (Signed)
Physical Therapy Treatment Patient Details Name: [name] [name] MRN: [id number] DOB: 1951-09-23 Today's Date: 02/01/2019    History of Present Illness Pt s/p Aortobifemoral bypass on 2/10. Pt developed afib with rvr, acute renal failure and ileus 2/12-2/13. 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     PT Comments    Pt now with multiple medical complications since initial evaluation. Pt only able to tolerate brief sitting EOB due to high HR and low BP.  In supine HR 110's-120's and BP 95/73. In sitting HR to 171 and BP 65/46. Pt returned to supine. Currently will keep recommendation at [facility] but pt will have to demonstrate medical stability and ability to tolerate intensive rehab. If pt does not then would need to look to ST-SNF.   Follow Up Recommendations  [name]     Equipment Recommendations  Other (comment)(To be determined)    Recommendations for Other Services       Precautions / Restrictions Precautions Precautions: Fall;Other (comment)(expressive aphasia, watch HR)    Mobility  Bed Mobility Overal bed mobility: Needs Assistance Bed Mobility: Sit to Supine;Rolling;Sidelying to Sit Rolling: Max assist Sidelying to sit: +2 for physical assistance;Max assist   Sit to supine: +2 for physical assistance;Max assist   General bed mobility comments: Asssit to move legs off of bed, elevate trunk into sitting and bring hips to EOB. Assist to lower trunk and bring feet back up into bed when returning to supine.  Transfers                 General transfer comment: Unable to attempt due to high HR and low BP  Ambulation/Gait             General Gait Details: Unable to attempt   Stairs             Wheelchair Mobility    Modified Rankin (Stroke Patients Only)       Balance Overall balance assessment: Needs assistance Sitting-balance support: No upper extremity supported;Feet supported Sitting balance-Leahy Scale:  Fair Sitting balance - Comments: Sat EOB x 4 minutes before having to return to supine due to high HR and low BP                                    Cognition Arousal/Alertness: Awake/alert Behavior During Therapy: WFL for tasks assessed/performed Overall Cognitive Status: Difficult to assess                                 General Comments: Pt seems at baseliner      Exercises      General Comments General comments (skin integrity, edema, etc.): In supine HR 110's-120's and BP 95/73. In sitting HR to 171 and BP 65/46.       Pertinent Vitals/Pain Pain Assessment: No/denies pain    Home Living                      Prior Function            PT Goals (current goals can now be found in the care plan section) Acute Rehab PT Goals Patient Stated Goal: walk Progress towards PT goals: Not progressing toward goals - comment(multiple medical problems)    Frequency    Min 3X/week      PT Plan  Current plan remains appropriate    Co-evaluation PT/OT/SLP Co-Evaluation/Treatment: Yes Reason for Co-Treatment: Complexity of the patient's impairments (multi-system involvement);For patient/therapist safety          AM-PAC PT "6 Clicks" Mobility   Outcome Measure  Help needed turning from your back to your side while in a flat bed without using bedrails?: Total Help needed moving from lying on your back to sitting on the side of a flat bed without using bedrails?: Total Help needed moving to and from a bed to a chair (including a wheelchair)?: Total Help needed standing up from a chair using your arms (e.g., wheelchair or bedside chair)?: Total Help needed to walk in hospital room?: Total Help needed climbing 3-5 steps with a railing? : Total 6 Click Score: 6    End of Session   Activity Tolerance: Treatment limited secondary to medical complications (Comment)(High HR and low BP) Patient left: in bed;with call bell/phone within  reach Nurse Communication: Mobility status PT Visit Diagnosis: Other abnormalities of gait and mobility (R26.89);Muscle weakness (generalized) (M62.81)     Time: 4081-4481 PT Time Calculation (min) (ACUTE ONLY): 18 min  Charges:  $Therapeutic Activity: 8-22 mins                     [facility] PT Acute Rehabilitation Services Pager [phone] Office [phone]    Angelina Ok [facility] 02/01/2019, 11:52 AM

## 2019-02-01 NOTE — Progress Notes (Addendum)
ANTICOAGULATION CONSULT NOTE  Pharmacy Consult:  Heparin Indication: atrial fibrillation  No Known Allergies  Patient Measurements: Height: 6\' 5"  (195.6 cm) Weight: 193 lb 2 oz (87.6 kg) IBW/kg (Calculated) : 89.1  Heparin dosing weight = 84 kg  Vital Signs: Temp: 97.5 F (36.4 C) (02/14 0800) Temp Source: Oral (02/14 0800) BP: 106/74 (02/14 0900) Pulse Rate: 107 (02/14 0400)  Labs: Recent Labs    01/30/19 0443 01/31/19 0556 01/31/19 0835 01/31/19 1316 01/31/19 1702 01/31/19 2009 02/01/19 0238  HGB 12.9* 11.9*  --   --  10.5*  --  9.3*  HCT 40.4 36.3*  --   --  32.1*  --  29.1*  PLT 145* 162  --   --  137*  --  162  LABPROT  --  15.8*  --   --   --   --  17.2*  INR  --  1.27  --   --   --   --  1.42  HEPARINUNFRC 0.37 0.74*  --   --   --   --  <0.10*  CREATININE 1.39* 2.67*  --   --   --  3.94* 4.15*  TROPONINI  --   --  <0.03 <0.03  --  <0.03  --     Estimated Creatinine Clearance: 20.8 mL/min (A) (by C-G formula based on SCr of 4.15 mg/dL (H)).  Assessment: 50 YOM s/p aortabifem bypass and has been on heparin infusion. Heparin was discontinued 2/13 due to Hg fall (13.3 on 2/11 to 10.5 on 2/13). Last dose of coumadin was 2/12 and on hold due to ileus. Pharmacy consulted to resume heparin with no bolus. -current Hg= 9.3  Goal of Therapy:  Heparin level= 0.3-0.5 Monitor platelets by anticoagulation protocol: Yes   Plan:  -resume heparin at 1000 units/hr -Heparin level in 6 hours and daily wth CBC daily  Harland German, PharmD Clinical Pharmacist **Pharmacist phone directory can now be found on [email or web address] ([email or web address]).  Listed under [facility].

## 2019-02-01 NOTE — Progress Notes (Signed)
[facility] [place]  Subjective  - feel a little better today   Objective 99/72 (!) 107 (!) 97.5 F (36.4 C) (Oral) (!) 24 99%  Intake/Output Summary (Last 24 hours) at 02/01/2019 8264 Last data filed at 02/01/2019 0600 Gross per 24 hour  Intake 1839.99 ml  Output 2143 ml  Net -303.01 ml   Abdomen soft less distended Feet pink warm No incisional drainage  Assessment/Planning: Acute renal failure probably secondary to fluid loss from ileus  Pt responded to 80 mg lasix this morning So far not hyperkalemic or resp compromise Borderline BP would be nice to get rid of dilt if possible Will try to continue urine output with lasix while maintaining reasonable fluid balance and BP.  Appreciate nephrology assistance.  Acute blood loss anemia probably dilutional will resume heparin today hold on coumadin until ileus resolves  Afib marginal rate control BP cardiology to see later today, troponins negative  [name] 02/01/2019 9:06 AM --  Laboratory Lab Results: Recent Labs    01/31/19 1702 02/01/19 0238  WBC 13.0* 7.7  HGB 10.5* 9.3*  HCT 32.1* 29.1*  PLT 137* 162   BMET Recent Labs    01/31/19 2009 02/01/19 0238  NA 134* 135  K 5.1 4.5  CL 106 105  CO2 18* 20*  GLUCOSE 134* 133*  BUN 45* 51*  CREATININE 3.94* 4.15*  CALCIUM 7.5* 7.4*    COAG Lab Results  Component Value Date   INR 1.42 02/01/2019   INR 1.27 01/31/2019   INR 1.16 01/28/2019   No results found for: PTT

## 2019-02-01 NOTE — Progress Notes (Signed)
Text page to vascular to make aware of pt having nausea - irrigated NGT awaiting call back

## 2019-02-01 NOTE — Telephone Encounter (Signed)
Called to notify pt overdue coumadin busy dial tone

## 2019-02-01 NOTE — Progress Notes (Signed)
Inpatient Rehabilitation Admissions Coordinator  I will discontinue the rehab consult order at this time for pt not medically ready for assessment. I will follow at a distance.  Ottie Glazier, RN, MSN Rehab Admissions Coordinator [phone] 02/01/2019 3:42 PM

## 2019-02-01 NOTE — Progress Notes (Signed)
Progress Note  Patient Name: KAVEON ITO Date of Encounter: 02/01/2019  Primary Cardiologist: Rollene Rotunda, MD   Subjective   Patient with poor UOP overnight, dark liquid (no obvious blood) from NG tube. Denies any pain. No frank bleeding reported. Fatigued, falls asleep easily.  Inpatient Medications    Scheduled Meds: . aspirin  325 mg Oral Daily  . furosemide  80 mg Intravenous Once  . pantoprazole (PROTONIX) IV  40 mg Intravenous Daily  . sodium chloride flush  10-40 mL Intracatheter Q12H   Continuous Infusions: . sodium chloride    . diltiazem (CARDIZEM) infusion 15 mg/hr (02/01/19 0629)  .  sodium bicarbonate  infusion 1000 mL 75 mL/hr at 02/01/19 0600   PRN Meds: sodium chloride, acetaminophen **OR** acetaminophen, alum & mag hydroxide-simeth, bisacodyl, guaiFENesin-dextromethorphan, hydrALAZINE, HYDROmorphone (DILAUDID) injection, labetalol, ondansetron, phenol, sodium chloride flush   Vital Signs    Vitals:   02/01/19 0300 02/01/19 0400 02/01/19 0500 02/01/19 0600  BP: (!) 81/60 93/70 98/73  91/68  Pulse:  (!) 107    Resp: (!) 23 14 (!) 23 15  Temp:  97.8 F (36.6 C)    TempSrc:  Oral    SpO2: 100% 100% 98% 100%  Weight:      Height:        Intake/Output Summary (Last 24 hours) at 02/01/2019 0811 Last data filed at 02/01/2019 0600 Gross per 24 hour  Intake 1922.25 ml  Output 2143 ml  Net -220.75 ml   Last 3 Weights 01/31/2019 01/30/2019 01/28/2019  Weight (lbs) 193 lb 2 oz 188 lb 7.9 oz 186 lb  Weight (kg) 87.6 kg 85.5 kg 84.369 kg      Telemetry    Atrial fibrillation, rare PVcs - Personally Reviewed  ECG    Coarse afib, HR 109 - Personally Reviewed  Physical Exam   GEN: appears fatigued, venturi mask in place   Neck: No JVD Cardiac: tachycardic, irregularly irregular, no murmurs, rubs, or gallops.  Respiratory: shallow respirations with minimal air movement at bases GI: Soft, distended though less than yesterday MS: No edema; No  deformity. Neuro:  Nonfocal  Psych: Normal affect   Labs    Chemistry Recent Labs  Lab 01/29/19 0209  01/31/19 0556 01/31/19 2009 02/01/19 0238  NA 138   < > 135 134* 135  K 4.4   < > 4.6 5.1 4.5  CL 106   < > 104 106 105  CO2 21*   < > 22 18* 20*  GLUCOSE 125*   < > 148* 134* 133*  BUN 15   < > 29* 45* 51*  CREATININE 1.40*   < > 2.67* 3.94* 4.15*  CALCIUM 8.3*   < > 7.8* 7.5* 7.4*  PROT 5.7*  --   --   --   --   ALBUMIN 3.3*  --   --   --   --   AST 41  --   --   --   --   ALT 35  --   --   --   --   ALKPHOS 35*  --   --   --   --   BILITOT 2.5*  --   --   --   --   GFRNONAA 51*   < > 23* 15* 14*  GFRAA 59*   < > 27* 17* 16*  ANIONGAP 11   < > 9 10 10    < > = values in this interval not displayed.  Hematology Recent Labs  Lab 01/31/19 0556 01/31/19 1702 02/01/19 0238  WBC 12.0* 13.0* 7.7  RBC 3.87* 3.41* 3.12*  HGB 11.9* 10.5* 9.3*  HCT 36.3* 32.1* 29.1*  MCV 93.8 94.1 93.3  MCH 30.7 30.8 29.8  MCHC 32.8 32.7 32.0  RDW 14.4 14.6 14.6  PLT 162 137* 162    Cardiac Enzymes Recent Labs  Lab 01/31/19 0835 01/31/19 1316 01/31/19 2009  TROPONINI <0.03 <0.03 <0.03   No results for input(s): TROPIPOC in the last 168 hours.   BNPNo results for input(s): BNP, PROBNP in the last 168 hours.   DDimer No results for input(s): DDIMER in the last 168 hours.   Radiology    Dg Abd 1 View  Result Date: 01/31/2019 CLINICAL DATA:  NG tube placement. Hx of stroke, chronic kidney disease. EXAM: ABDOMEN - 1 VIEW COMPARISON:  01/31/2019 FINDINGS: Interval placement of nasogastric tube, tip at the UPPER aspect of the imaged, likely within the stomach. Persistent significant dilatation of large and small bowel loops. Joint space loss of the RIGHT hip. IMPRESSION: Tip of nasogastric tube likely within the stomach. Electronically Signed   By: Norva PavlovElizabeth  Brown M.D.   On: 01/31/2019 20:18   Dg Abd 1 View  Result Date: 01/31/2019 CLINICAL DATA:  Abdominal distention Pt was  hurting when put in supine position Nurse assisted throughout EXAM: ABDOMEN - 1 VIEW COMPARISON:  CT of the abdomen and pelvis on 01/10/2019 FINDINGS: There is significant gaseous distension of large and small bowel loops and stomach. No evidence for free intraperitoneal air. Surgical clips overlie the midline of the abdomen. IMPRESSION: Significant gaseous distension of large and small bowel loops and stomach. Findings consistent with ileus. Electronically Signed   By: Norva PavlovElizabeth  Brown M.D.   On: 01/31/2019 17:12   Dg Chest Port 1 View  Result Date: 01/31/2019 CLINICAL DATA:  Shortness of breath and weakness EXAM: PORTABLE CHEST 1 VIEW COMPARISON:  01/29/2019 FINDINGS: Shallow lung inflation with right basilar atelectasis. No focal consolidation. No pleural effusion or pneumothorax. Cardiomediastinal contours are normal. Enteric tube and right IJ central venous catheter have been removed. IMPRESSION: No active disease. Electronically Signed   By: Deatra RobinsonKevin  Herman M.D.   On: 01/31/2019 04:13   Vas [place] Vanice Sarahbi With/wo Tbi  Result Date: 01/31/2019 LOWER EXTREMITY DOPPLER STUDY Indications: Post op.  Performing Technologist: Blanch MediaMegan Riddle RVS  Examination Guidelines: A complete evaluation includes at minimum, Doppler waveform signals and systolic blood pressure reading at the level of bilateral brachial, anterior tibial, and posterior tibial arteries, when vessel segments are accessible. Bilateral testing is considered an integral part of a complete examination. Photoelectric Plethysmograph (PPG) waveforms and toe systolic pressure readings are included as required and additional duplex testing as needed. Limited examinations for reoccurring indications may be performed as noted.  ABI Findings: +--------+------------------+-----+-------------------+--------+ Right   Rt Pressure (mmHg)IndexWaveform           Comment  +--------+------------------+-----+-------------------+--------+ [id number]                     triphasic                   +--------+------------------+-----+-------------------+--------+ PTA     67                0.63 dampened monophasic         +--------+------------------+-----+-------------------+--------+ DP  absent                      +--------+------------------+-----+-------------------+--------+ +--------+------------------+-----+-------------------+-------+ Left    Lt Pressure (mmHg)IndexWaveform           Comment +--------+------------------+-----+-------------------+-------+ Brachial                       triphasic                  +--------+------------------+-----+-------------------+-------+ PTA     68                0.64 dampened monophasic        +--------+------------------+-----+-------------------+-------+ DP      73                0.68 dampened monophasic        +--------+------------------+-----+-------------------+-------+ +-------+-----------+-----------+------------+------------+ ABI/TBIToday's ABIToday's TBIPrevious ABIPrevious TBI +-------+-----------+-----------+------------+------------+ Right  0.63                                           +-------+-----------+-----------+------------+------------+ Left   0.68                                           +-------+-----------+-----------+------------+------------+  Summary: Right: Resting right ankle-brachial index indicates moderate right lower extremity arterial disease. Left: Resting left ankle-brachial index indicates moderate left lower extremity arterial disease.  *See table(s) above for measurements and observations.  Electronically signed by Sherald Hess MD on 01/31/2019 at 2:52:37 PM.   Final    Vas US Renal Artery Duplex  Result Date: 01/31/2019 ABDOMINAL VISCERAL Indications: Acute renal failure Limitations: Air/bowel gas. Performing Technologist: Jeb Levering RDMS, RVT  Examination Guidelines: A complete evaluation includes B-mode  imaging, spectral Doppler, color Doppler, and power Doppler as needed of all accessible portions of each vessel. Bilateral testing is considered an integral part of a complete examination. Limited examinations for reoccurring indications may be performed as noted.  Duplex Findings:  Mesenteric Technologist observations: Aorta not visualized.  Technologist observations Renal Artery(s):Bilateral kidneys not visualized due to excessive overlying bowel gas and fluid filled bowel.  Summary: Renal:   Inconclusive study, see above.  *See table(s) above for measurements and observations.  Diagnosing physician: Sherald Hess MD  Electronically signed by Sherald Hess MD on 01/31/2019 at 2:51:48 PM.    Final     Cardiac Studies   Echo today:  1. The left ventricle has normal systolic function with an ejection fraction of 60-65%. The cavity size was normal. There is moderately increased left ventricular wall thickness. Left ventricular diastology could not be evaluated secondary to atrial  fibrillation.  2. The right ventricle has normal systolic function. The cavity was normal. There is no increase in right ventricular wall thickness.  3. The mitral valve is normal in structure.  4. The tricuspid valve is normal in structure.  5. Probably trileaflet Mild thickening of the aortic valve Moderate calcification of the aortic valve. no stenosis of the aortic valve.  6. The pulmonic valve was normal in structure.  7. 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  8. 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  9. Right atrial pressure is estimated at 3 mmHg.  Patient Profile  68 y.o. male with a hx of atrial fibrillation and tachy-brady syndrome who has refused PPM in the past, chronic anticoagulation therapy w/ Coumadin, h/o CVA,  CKD and PVD admitted for elective Bi-Femoral bypass, who is being followed for the evaluation of atrial fibrillation w/ RVR at the request  of Dr. Darrick Penna, Vascular Surgery.   Assessment & Plan    Post op atrial fibrillation: given HR still over 100 on max dilt drip, as well as borderline blood pressures, will change to amiodarone. Had NG tube already in place, may experience some nausea -change dilt to amiodarone drip. Need to be aware that he had bradycardia with amiodarone in the past, but limited on other options given NPO status and poor control with diltiazem as well as worsening renal function. -echo today with preserved EF. On echo, right atrial pressure estimated at 3 mmHg, which supports volume depletion. Would continue IV fluids. -once able to take oral medications, would prefer to stop the amiodarone given prior bradycardia on it. Digoxin will be limited by renal function, but if blood pressure allows could restart oral metoprolol when stable. If he converts to sinus on amiodarone, would stop amiodarone drip and monitor -currently warfarin on hold, on IV heparin. Will need to monitor warfarin dosing if he remains on amiodarone for any length of time.  ASCVD: history of CVA, PAD s/p aortobifemoral bypass, bilateral common femoral endarterectomy and profundoplasty, bilateral iliac thrombectomy on 01/28/19  Acute kidney injury: Cr up to 4.15 today, received several liters of IV fluid yesterday but made only 400 cc of urine. Did respond to lasix challenge this AM, with 950 cc out thus far. Nephrology consulted. Given normal EF and right atrial pressure of 3 on echo, as well as volume loss from NG tube with ileus, he may require additional fluids. Will defer to nephrology. Has been on high oxygen mask, but saturations are good. Suspect this may be due to atelectasis and shallow breathing given ileus. Would see if O2 can be weaned.  CRITICAL CARE Patient is critically ill with multiple organ systems affected and requires high complexity decision making. Total critical care time:32 minutes. This time includes gathering of history,  evaluation of patient's response to treatment, examination of patient, review of laboratory and imaging studies, and coordination with consultants.   For questions or updates, please contact [facility] Please consult [email or web address] for contact info under     Signed, Jodelle Red, MD  02/01/2019, 8:11 AM

## 2019-02-01 NOTE — Progress Notes (Signed)
Text page to Dr Cristal Deer prior to Amiodarine bolus  and gtt BP is 90/67 - text page returned with call and orders .

## 2019-02-01 NOTE — Progress Notes (Signed)
Occupational Therapy Treatment Patient Details Name: [name] [name] MRN: [id number] DOB: Jun 11, 1951 Today's Date: 02/01/2019    History of present illness Pt s/p Aortobifemoral bypass on 2/10. Pt developed afib with rvr, acute renal failure and ileus 2/12-2/13. 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    OT comments  Limited session to bed mobility and sitting EOB x 4 minutes. Pt with BP of 95/73 in supine, 65/46 in sitting and returned to 96/59. HR to 170, maintained Sp02 in mid 90s on 5L. May need to update d/c disposition next visit.  Follow Up Recommendations  [name];Supervision/Assistance - 24 hour    Equipment Recommendations       Recommendations for Other Services      Precautions / Restrictions Precautions Precautions: Fall;Other (comment)(expression aphasia, watch HR)       Mobility Bed Mobility Overal bed mobility: Needs Assistance Bed Mobility: Sit to Supine;Rolling;Sidelying to Sit Rolling: Max assist Sidelying to sit: +2 for physical assistance;Max assist   Sit to supine: +2 for physical assistance;Max assist   General bed mobility comments: Asssit to move legs off of bed, elevate trunk into sitting and bring hips to EOB. Assist to lower trunk and bring feet back up into bed when returning to supine.  Transfers                 General transfer comment: Unable to attempt due to high HR and low BP    Balance Overall balance assessment: Needs assistance Sitting-balance support: No upper extremity supported;Feet supported Sitting balance-Leahy Scale: Fair Sitting balance - Comments: Sat EOB x 4 minutes before having to return to supine due to high HR and low BP                                   ADL either performed or assessed with clinical judgement   ADL                                         General ADL Comments: pt requiring total to max assist     Vision        Perception     Praxis      Cognition Arousal/Alertness: Awake/alert Behavior During Therapy: Flat affect Overall Cognitive Status: Difficult to assess                                 General Comments: minimal conversant        Exercises     Shoulder Instructions       General Comments In supine HR 110's-120's and BP 95/73. In sitting HR to 171 and BP 65/46.     Pertinent Vitals/ Pain       Pain Assessment: 0-10  Home Living                                          Prior Functioning/Environment              Frequency  Min 2X/week        Progress Toward Goals  OT Goals(current goals can now be found in the care plan section)  Progress towards OT goals: Not progressing toward goals - comment(HR to 170s with sitting EOB)  Acute Rehab OT Goals Patient Stated Goal: agreeable to sitting EOB OT Goal Formulation: With patient Time For Goal Achievement: 02/12/19 Potential to Achieve Goals: Good  Plan Discharge plan remains appropriate    Co-evaluation    PT/OT/SLP Co-Evaluation/Treatment: Yes Reason for Co-Treatment: Complexity of the patient's impairments (multi-system involvement);For patient/therapist safety   OT goals addressed during session: Strengthening/ROM      AM-PAC OT "6 Clicks" Daily Activity     Outcome Measure   Help from another person eating meals?: Total Help from another person taking care of personal grooming?: A Lot Help from another person toileting, which includes using toliet, bedpan, or urinal?: Total Help from another person bathing (including washing, rinsing, drying)?: A Lot Help from another person to put on and taking off regular upper body clothing?: A Lot Help from another person to put on and taking off regular lower body clothing?: Total 6 Click Score: 9    End of Session    OT Visit Diagnosis: Unsteadiness on feet (R26.81);Other abnormalities of gait and mobility (R26.89);Muscle  weakness (generalized) (M62.81)   Activity Tolerance Patient limited by fatigue;Treatment limited secondary to medical complications (Comment)(elevated HR)   Patient Left in bed;with call bell/phone within reach   Nurse Communication Mobility status(aware of HR)        Time: 1224-8250 OT Time Calculation (min): 18 min  Charges: OT General Charges $OT Visit: 1 Visit  [name] [name], [name] [name] Acute Rehabilitation Services Pager: [phone] Office: [phone]  [name] [name] 02/01/2019, 1:13 PM

## 2019-02-01 NOTE — Progress Notes (Signed)
  Echocardiogram 2D Echocardiogram has been performed.  [name] [name] 02/01/2019, 9:24 AM

## 2019-02-01 NOTE — Progress Notes (Addendum)
ANTICOAGULATION CONSULT NOTE  Pharmacy Consult:  Heparin Indication: atrial fibrillation  No Known Allergies  Patient Measurements: Height: 6\' 5"  (195.6 cm) Weight: 193 lb 2 oz (87.6 kg) IBW/kg (Calculated) : 89.1  Heparin dosing weight = 84 kg  Vital Signs: Temp: 98.5 F (36.9 C) (02/14 1631) Temp Source: Oral (02/14 1631) BP: 99/63 (02/14 1800)  Labs: Recent Labs    01/31/19 0556 01/31/19 0835 01/31/19 1316 01/31/19 1702 01/31/19 2009 02/01/19 0238 02/01/19 1127 02/01/19 1805  HGB 11.9*  --   --  10.5*  --  9.3*  --   --   HCT 36.3*  --   --  32.1*  --  29.1*  --   --   PLT 162  --   --  137*  --  162  --   --   LABPROT 15.8*  --   --   --   --  17.2*  --   --   INR 1.27  --   --   --   --  1.42  --   --   HEPARINUNFRC 0.74*  --   --   --   --  <0.10*  --  0.18*  CREATININE 2.67*  --   --   --  3.94* 4.15* 4.04*  --   TROPONINI  --  <0.03 <0.03  --  <0.03  --   --   --     Estimated Creatinine Clearance: 21.4 mL/min (A) (by C-G formula based on SCr of 4.04 mg/dL (H)).  Assessment: 83 YOM s/p aortabifem bypass and has been on heparin infusion. Heparin was discontinued 2/13 due to drop in hemoglobin (13.3 on 2/11 to 10.5 on 2/13). Last dose of Coumadin was 2/12 and on hold due to ileus. Pharmacy consulted to resume heparin with no bolus.  Heparin level is sub-therapeutic.  No issue with heparin infusion and no bleeding per RN.  Goal of Therapy:  Heparin level 0.3-0.5 units/mL Monitor platelets by anticoagulation protocol: Yes   Plan:  Increase heparin gtt to 1200 units/hr Check 6 hr heparin level  Laqueena Hinchey D. Laney Potash, PharmD, BCPS, BCCCP 02/01/2019, 8:15 PM

## 2019-02-01 NOTE — Consult Note (Signed)
[facility] Renal Consultation Note  Requesting MD:  Dr. Darrick PennaFields Indication for Consultation: AKI   HPI: [name] [name] is a 68 y.o. male with atrial fib, PVD, h/o CVA s/p aorto bifemoral bypass on 2/10 who is seen for AKI.   Creatinine at baseline 1.4 until post op day 3 trended up to 2.67 AM > 3.97 PM and 4.15.  He's had some A fib with RVR complicated by modest hypotension - 90/50s (outpt BP appear generally 120-130s).  Additionally he developed distended abd secondary to presumed ileus and upon insertion of NG tube 1L fluid out.  He received several isotonic boluses through the day in light of falling UOP, hypotension.  I/Os yesterday 1.9 / 390.  Case d/w on call nephrologist last pm who rec bicarb gtt which has been running at 75/hr.  Cardiology saw him yesterday and felt he looked dry.    He's been on ventimask 14lpm for the past 24+ hrs with sats 99-100%.   Hb trended down ~13 > 9.3 since OR, anticoagulation has been stopped.   A dose of furosemide 80 IV was given this AM.   This AM he is having cardiac TTE.  He denies particular complaints.    PMHx:   Past Medical History:  Diagnosis Date  . Afib (HCC)    a. Paroxysmal --> previous issues with bradycardia while on Amiodarone and he refused PPM placement. Rate-control strategy pursued.   . Aortic stenosis   . Chronic kidney disease    unknown problem to see kidney  doctor in March in [place]  . Dysrhythmia    Atril Fib  . Peripheral vascular disease (HCC)   . Stroke [facility](HCC)    Aphasia, right sided weakness     Past Surgical History:  Procedure Laterality Date  . AORTA - BILATERAL FEMORAL ARTERY BYPASS GRAFT Bilateral 01/28/2019   Procedure: AORTA BIFEMORAL BYPASS GRAFT;  Surgeon: Sherren KernsFields, [name], MD;  [facility];  Service: Vascular;  Laterality: Bilateral;  . AORTA BIFEMORAL BYPASS GRAFT  01/28/2019  . EMBOLECTOMY Right 08/30/2017   Procedure: Right popliteal tibial endarectomy with patch  angioplasty;  Surgeon: Sherren KernsFields, [name], MD;  [facility];  Service: Vascular;  Laterality: Right;  . ENDARTERECTOMY FEMORAL Bilateral 01/28/2019   Procedure: BILATERAL FEMORAL ENDARTERECTOMY WITH PROFUNDAPLASTY;  Surgeon: Sherren KernsFields, [name], MD;  [facility];  Service: Vascular;  Laterality: Bilateral;  . FASCIOTOMY CLOSURE Bilateral 08/28/2017   Procedure: FASCIOTOMY CLOSURE/BILATERAL  lower legs;  Surgeon: Sherren KernsFields, [name], MD;  [facility];  Service: Vascular;  Laterality: Bilateral;  . FEMORAL-POPLITEAL BYPASS GRAFT Bilateral 08/26/2017   Procedure: Bilateral FEMORAL EMBOLECTOMY with  BILATERAL Four Compartment  FASCIOTOMIES.;  Surgeon: Sherren KernsFields, [name], MD;  [facility];  Service: Vascular;  Laterality: Bilateral;  . FEMORAL-POPLITEAL BYPASS GRAFT Bilateral 01/28/2019   Procedure: BILATERAL  ILIAC THROMBECTOMY;  Surgeon: Sherren KernsFields, [name], MD;  [facility];  Service: Vascular;  Laterality: Bilateral;    Family Hx:  Family History  Problem Relation Age of Onset  . Stroke Sister     Social History:  reports that he has never smoked. He has never used smokeless tobacco. He reports that he does not drink alcohol or use drugs.  Allergies: No Known Allergies  Medications: Prior to Admission medications   Medication Sig Start Date End Date Taking? Authorizing Provider  aspirin 325 MG tablet Take 325 mg by mouth daily.   Yes [provider]  atorvastatin (LIPITOR) 20 MG tablet  Take 1 tablet (20 mg total) by mouth daily. 01/22/19 04/22/19 Yes Jodelle Gross, NP  metoprolol tartrate (LOPRESSOR) 25 MG tablet Take 25 mg by mouth daily.   Yes [provider]  warfarin (COUMADIN) 1 MG tablet Take 1.5 mg by mouth daily.   Yes [provider]    I have reviewed the patient's current medications. Noting Dilt gtt, lasix 80 IV x 1, bicarb gtt 75/hr  Labs:  Results for orders placed or performed during the hospital encounter of 01/28/19 (from the past 48  hour(s))  CBC     Status: Abnormal   Collection Time: 01/31/19  5:56 AM  Result Value Ref Range   WBC 12.0 (H) 4.0 - 10.5 K/uL   RBC 3.87 (L) 4.22 - 5.81 MIL/uL   Hemoglobin 11.9 (L) 13.0 - 17.0 g/dL   HCT 75.1 (L) 02.5 - 85.2 %   MCV 93.8 80.0 - 100.0 fL   MCH 30.7 26.0 - 34.0 pg   MCHC 32.8 30.0 - 36.0 g/dL   RDW 77.8 24.2 - 35.3 %   Platelets 162 150 - 400 K/uL   nRBC 0.0 0.0 - 0.2 %    Comment: Performed at [facility], [address], [place], [place] [address]  Basic metabolic panel     Status: Abnormal   Collection Time: 01/31/19  5:56 AM  Result Value Ref Range   Sodium 135 135 - 145 mmol/L   Potassium 4.6 3.5 - 5.1 mmol/L   Chloride 104 98 - 111 mmol/L   CO2 22 22 - 32 mmol/L   Glucose, Bld 148 (H) 70 - 99 mg/dL   BUN 29 (H) 8 - 23 mg/dL   Creatinine, Ser 1.54 (H) 0.61 - 1.24 mg/dL    Comment: DELTA CHECK NOTED   Calcium 7.8 (L) 8.9 - 10.3 mg/dL   GFR calc non Af Amer 23 (L) >60 mL/min   GFR calc Af Amer 27 (L) >60 mL/min   Anion gap 9 5 - 15    Comment: Performed at [facility], [address], [place], [place] [address]  Heparin level (unfractionated)     Status: Abnormal   Collection Time: 01/31/19  5:56 AM  Result Value Ref Range   Heparin Unfractionated 0.74 (H) 0.30 - 0.70 IU/mL    Comment: (NOTE) If heparin results are below expected values, and patient dosage has  been confirmed, suggest follow up testing of antithrombin III levels. Performed at [facility], [address], [place], [place] [address]   Protime-INR     Status: Abnormal   Collection Time: 01/31/19  5:56 AM  Result Value Ref Range   Prothrombin Time 15.8 (H) 11.4 - 15.2 seconds   INR 1.27     Comment: Performed at [facility], [address] [place], [place] [address]  Troponin I - Now Then Q6H     Status: None   Collection Time: 01/31/19  8:35 AM  Result Value Ref Range   Troponin I <0.03 <0.03 ng/mL    Comment: Performed at [facility], [address] [place], [place] [address]  Troponin I - Now Then Q6H     Status: None   Collection Time: 01/31/19  1:16 PM  Result Value Ref Range   Troponin I <0.03 <0.03 ng/mL    Comment: Performed at [facility], [address], [place], [place] [address]  CBC     Status: Abnormal  Collection Time: 01/31/19  5:02 PM  Result Value Ref Range   WBC 13.0 (H) 4.0 - 10.5 K/uL   RBC 3.41 (L) 4.22 - 5.81 MIL/uL   Hemoglobin 10.5 (L) 13.0 - 17.0 g/dL   HCT 38.8 (L) 82.8 - 00.3 %   MCV 94.1 80.0 - 100.0 fL   MCH 30.8 26.0 - 34.0 pg   MCHC 32.7 30.0 - 36.0 g/dL   RDW 49.1 79.1 - 50.5 %   Platelets 137 (L) 150 - 400 K/uL   nRBC 0.0 0.0 - 0.2 %    Comment: Performed at [facility], [address], [place], [place] [address]  Troponin I - Now Then Q6H     Status: None   Collection Time: 01/31/19  8:09 PM  Result Value Ref Range   Troponin I <0.03 <0.03 ng/mL    Comment: Performed at [facility], [address], [place], [place] [address]  Basic metabolic panel     Status: Abnormal   Collection Time: 01/31/19  8:09 PM  Result Value Ref Range   Sodium 134 (L) 135 - 145 mmol/L   Potassium 5.1 3.5 - 5.1 mmol/L   Chloride 106 98 - 111 mmol/L   CO2 18 (L) 22 - 32 mmol/L   Glucose, Bld 134 (H) 70 - 99 mg/dL   BUN 45 (H) 8 - 23 mg/dL   Creatinine, Ser 5.37 (H) 0.61 - 1.24 mg/dL    Comment: REPEATED TO VERIFY DELTA CHECK NOTED OK PER RN    Calcium 7.5 (L) 8.9 - 10.3 mg/dL   GFR calc non Af Amer 15 (L) >60 mL/min   GFR calc Af Amer 17 (L) >60 mL/min   Anion gap 10 5 - 15    Comment: Performed at [facility], [address] [place], [place] [address]  CBC     Status: Abnormal   Collection Time: 02/01/19  2:38 AM  Result Value Ref Range   WBC 7.7 4.0 - 10.5 K/uL   RBC 3.12 (L) 4.22 - 5.81 MIL/uL   Hemoglobin 9.3 (L) 13.0 - 17.0 g/dL   HCT 78.6 (L) 75.4 - 49.2 %   MCV 93.3 80.0 - 100.0 fL   MCH 29.8 26.0 - 34.0 pg   MCHC 32.0 30.0 - 36.0 g/dL   RDW 01.0 07.1 - 21.9  %   Platelets 162 150 - 400 K/uL   nRBC 0.0 0.0 - 0.2 %    Comment: Performed at [facility], [address] [place], [place] [address]  Heparin level (unfractionated)     Status: Abnormal   Collection Time: 02/01/19  2:38 AM  Result Value Ref Range   Heparin Unfractionated <0.10 (L) 0.30 - 0.70 IU/mL    Comment: (NOTE) If heparin results are below expected values, and patient dosage has  been confirmed, suggest follow up testing of antithrombin III levels. Performed at [facility], [address], [place], [place] [address]   Protime-INR     Status: Abnormal   Collection Time: 02/01/19  2:38 AM  Result Value Ref Range   Prothrombin Time 17.2 (H) 11.4 - 15.2 seconds   INR 1.42     Comment: Performed at [facility], [address], [place], [place] [address]  Basic metabolic panel     Status: Abnormal   Collection Time: 02/01/19  2:38 AM  Result Value Ref Range   Sodium 135 135 - 145 mmol/L  Potassium 4.5 3.5 - 5.1 mmol/L   Chloride 105 98 - 111 mmol/L   CO2 20 (L) 22 - 32 mmol/L   Glucose, Bld 133 (H) 70 - 99 mg/dL   BUN 51 (H) 8 - 23 mg/dL   Creatinine, Ser 1.614.15 (H) 0.61 - 1.24 mg/dL   Calcium 7.4 (L) 8.9 - 10.3 mg/dL   GFR calc non Af Amer 14 (L) >60 mL/min   GFR calc Af Amer 16 (L) >60 mL/min   Anion gap 10 5 - 15    Comment: Performed at [facility], [address] [place], [place] [address]     ROS:  Pertinent items are noted in HPI.  Physical Exam: Vitals:   02/01/19 0500 02/01/19 0600  BP: 98/73 91/68  Pulse:    Resp: (!) 23 15  Temp:    SpO2: 98% 100%     General: lying in bed in no distress HEENT: MM dry with venti mask on and mouth breathing Eyes: anicteric Neck: supple, JVD difficult to assess but does not look elevated to me Heart: tachycardic, irregular Lungs: a bit coarse anteriorly and dec in bases but no rales and normal WOB Abdomen: soft, mod distended, generally non tender to palpation; long midline incision  which is c/d/i Extremities: no edema Skin: no livedo, no janeways Neuro: grossly nonfocal except some speech difficulty post CVA but 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  Creatinine up to 4.15 today.  This is likely hemodynamic insult leading to ATN in setting of fluid shifts and a fib with RVR.  His MAPs are currently reasonable.  His abdomen is mod distended but no not tense and I think abd compartment is unlikely.   UOP yesterday 390, but essentially anuric through the early AM.   He did receive lasix 80 IV this AM about 1 hr ago and has 250mL of light yellow urine in bag now which is encouraging.  At this point I see no indications for dialysis/CRRT and his UOP after diuretic is encouraging.  If anything he looks a bit hypovolemic to me currently so I will continue the bicarb gtt for now.  Would dose diuretics PRN based on clinical volume status.   Check labs at noon and review UOP.  If things headed in wrong direction will likely proceed with RRT, but may be able to hold.    **Metabolic acidosis, non gap: secondary to AKI.  Currently on bicarb gtt with serum bicarb 20.  Cont gtt for now.   **Anemia:  Transfusion PRN per primary.   **Atrial fibrillation:  Defer mgmt to cardiology - working towards rate control.  I think his MAPs are reasonable currently.   Tyler PitaLindsay A Kiyon Fidalgo 02/01/2019, 8:06 AM

## 2019-02-02 DIAGNOSIS — E861 Hypovolemia: Secondary | ICD-10-CM

## 2019-02-02 DIAGNOSIS — I480 Paroxysmal atrial fibrillation: Secondary | ICD-10-CM

## 2019-02-02 DIAGNOSIS — I9589 Other hypotension: Secondary | ICD-10-CM

## 2019-02-02 LAB — RENAL FUNCTION PANEL
ALBUMIN: 2.2 g/dL — AB (ref 3.5–5.0)
Anion gap: 9 (ref 5–15)
BUN: 51 mg/dL — ABNORMAL HIGH (ref 8–23)
CO2: 28 mmol/L (ref 22–32)
Calcium: 7.6 mg/dL — ABNORMAL LOW (ref 8.9–10.3)
Chloride: 101 mmol/L (ref 98–111)
Creatinine, Ser: 3.27 mg/dL — ABNORMAL HIGH (ref 0.61–1.24)
GFR calc Af Amer: 21 mL/min — ABNORMAL LOW (ref >60)
GFR calc non Af Amer: 18 mL/min — ABNORMAL LOW (ref >60)
Glucose, Bld: 111 mg/dL — ABNORMAL HIGH (ref 70–99)
Phosphorus: 3.4 mg/dL (ref 2.5–4.6)
Potassium: 3.3 mmol/L — ABNORMAL LOW (ref 3.5–5.1)
Sodium: 138 mmol/L (ref 135–145)

## 2019-02-02 LAB — CBC
HCT: 24.9 % — ABNORMAL LOW (ref 39.0–52.0)
HCT: 29.4 % — ABNORMAL LOW (ref 39.0–52.0)
Hemoglobin: 8.3 g/dL — ABNORMAL LOW (ref 13.0–17.0)
Hemoglobin: 9.8 g/dL — ABNORMAL LOW (ref 13.0–17.0)
MCH: 29.9 pg (ref 26.0–34.0)
MCH: 30.7 pg (ref 26.0–34.0)
MCHC: 33.3 g/dL (ref 30.0–36.0)
MCHC: 33.3 g/dL (ref 30.0–36.0)
MCV: 89.6 fL (ref 80.0–100.0)
MCV: 92.2 fL (ref 80.0–100.0)
Platelets: 141 10*3/uL — ABNORMAL LOW (ref 150–400)
Platelets: 157 10*3/uL (ref 150–400)
RBC: 2.7 MIL/uL — ABNORMAL LOW (ref 4.22–5.81)
RBC: 3.28 MIL/uL — ABNORMAL LOW (ref 4.22–5.81)
RDW: 14.6 % (ref 11.5–15.5)
RDW: 14.9 % (ref 11.5–15.5)
WBC: 6.5 10*3/uL (ref 4.0–10.5)
WBC: 6.5 10*3/uL (ref 4.0–10.5)
nRBC: 0 % (ref 0.0–0.2)
nRBC: 0 % (ref 0.0–0.2)

## 2019-02-02 LAB — PROTIME-INR
INR: 1.53
PROTHROMBIN TIME: 18.2 s — AB (ref 11.4–15.2)

## 2019-02-02 LAB — PREPARE RBC (CROSSMATCH)

## 2019-02-02 LAB — HEPARIN LEVEL (UNFRACTIONATED): Heparin Unfractionated: 0.29 IU/mL — ABNORMAL LOW (ref 0.30–0.70)

## 2019-02-02 MED ORDER — SODIUM CHLORIDE 0.9 % IV SOLN
INTRAVENOUS | Status: DC
  Administered 2019-02-02: 75 mL/h via INTRAVENOUS
  Administered 2019-02-03: 01:00:00 via INTRAVENOUS

## 2019-02-02 MED ORDER — SODIUM CHLORIDE 0.9% IV SOLUTION
Freq: Once | INTRAVENOUS | Status: AC
  Administered 2019-02-02: 01:00:00 via INTRAVENOUS

## 2019-02-02 NOTE — Progress Notes (Signed)
Progress Note  Patient Name: [name] [name] Date of Encounter: 02/02/2019  Primary Cardiologist: Rollene Rotunda, MD   Subjective   Fatigued, resting in bed this AM, awakens easily to voice. Denies any pain.  Inpatient Medications    Scheduled Meds: . aspirin  325 mg Oral Daily  . mouth rinse  15 mL Mouth Rinse BID  . metoprolol tartrate  2.5 mg Intravenous Q6H  . pantoprazole (PROTONIX) IV  40 mg Intravenous Q24H  . sodium chloride flush  10-40 mL Intracatheter Q12H   Continuous Infusions: . amiodarone 30 mg/hr (02/02/19 0900)  . heparin 1,200 Units/hr (02/02/19 0900)  . phenylephrine (NEO-SYNEPHRINE) Adult infusion Stopped (02/02/19 0734)  .  sodium bicarbonate  infusion 1000 mL 75 mL/hr at 02/02/19 0900   PRN Meds: acetaminophen **OR** acetaminophen, alum & mag hydroxide-simeth, bisacodyl, guaiFENesin-dextromethorphan, hydrALAZINE, HYDROmorphone (DILAUDID) injection, labetalol, ondansetron, phenol, sodium chloride flush   Vital Signs    Vitals:   02/02/19 0715 02/02/19 0730 02/02/19 0746 02/02/19 0800  BP: 114/72 (!) 115/104  99/75  Pulse:      Resp: 14 14  16   Temp:   97.7 F (36.5 C)   TempSrc:   Oral   SpO2: 98% 97%  96%  Weight:      Height:        Intake/Output Summary (Last 24 hours) at 02/02/2019 0914 Last data filed at 02/02/2019 0900 Gross per 24 hour  Intake 4119.76 ml  Output 4170 ml  Net -50.24 ml   Last 3 Weights 02/02/2019 01/31/2019 01/30/2019  Weight (lbs) 199 lb 8.3 oz 193 lb 2 oz 188 lb 7.9 oz  Weight (kg) 90.5 kg 87.6 kg 85.5 kg      Telemetry    Atrial fibrillation, rare PVcs, rate >100 bpm - Personally Reviewed  ECG    12/13 Coarse afib, HR 109 - Personally Reviewed  Physical Exam   GEN: appears fatigued, NG and nasal cannula in place   Neck: No JVD Cardiac: tachycardic, irregularly irregular, no murmurs, rubs, or gallops.  Respiratory: shallow respirations with minimal air movement at bases GI: Soft, distended though less  than yesterday MS: No edema; No deformity. Neuro:  Nonfocal  Psych: Normal affect   Labs    Chemistry Recent Labs  Lab 01/29/19 0209  02/01/19 0238 02/01/19 1127 02/02/19 0743  NA 138   < > 135 137 138  K 4.4   < > 4.5 3.9 3.3*  CL 106   < > 105 101 101  CO2 21*   < > 20* 22 28  GLUCOSE 125*   < > 133* 125* 111*  BUN 15   < > 51* 53* 51*  CREATININE 1.40*   < > 4.15* 4.04* 3.27*  CALCIUM 8.3*   < > 7.4* 7.6* 7.6*  PROT 5.7*  --   --   --   --   ALBUMIN 3.3*  --   --  2.5* 2.2*  AST 41  --   --   --   --   ALT 35  --   --   --   --   ALKPHOS 35*  --   --   --   --   BILITOT 2.5*  --   --   --   --   GFRNONAA 51*   < > 14* 14* 18*  GFRAA 59*   < > 16* 16* 21*  ANIONGAP 11   < > 10 14 9    < > =  values in this interval not displayed.     Hematology Recent Labs  Lab 02/01/19 0238 02/01/19 2353 02/02/19 0743  WBC 7.7 6.5 6.5  RBC 3.12* 2.70* 3.28*  HGB 9.3* 8.3* 9.8*  HCT 29.1* 24.9* 29.4*  MCV 93.3 92.2 89.6  MCH 29.8 30.7 29.9  MCHC 32.0 33.3 33.3  RDW 14.6 14.6 14.9  PLT 162 157 141*    Cardiac Enzymes Recent Labs  Lab 01/31/19 0835 01/31/19 1316 01/31/19 2009  TROPONINI <0.03 <0.03 <0.03   No results for input(s): TROPIPOC in the last 168 hours.   BNPNo results for input(s): BNP, PROBNP in the last 168 hours.   DDimer No results for input(s): DDIMER in the last 168 hours.   Radiology    Dg Abd 1 View  Result Date: 01/31/2019 CLINICAL DATA:  NG tube placement. Hx of stroke, chronic kidney disease. EXAM: ABDOMEN - 1 VIEW COMPARISON:  01/31/2019 FINDINGS: Interval placement of nasogastric tube, tip at the UPPER aspect of the imaged, likely within the stomach. Persistent significant dilatation of large and small bowel loops. Joint space loss of the RIGHT hip. IMPRESSION: Tip of nasogastric tube likely within the stomach. Electronically Signed   By: Norva Pavlov M.D.   On: 01/31/2019 20:18   Dg Abd 1 View  Result Date: 01/31/2019 CLINICAL DATA:   Abdominal distention Pt was hurting when put in supine position Nurse assisted throughout EXAM: ABDOMEN - 1 VIEW COMPARISON:  CT of the abdomen and pelvis on 01/10/2019 FINDINGS: There is significant gaseous distension of large and small bowel loops and stomach. No evidence for free intraperitoneal air. Surgical clips overlie the midline of the abdomen. IMPRESSION: Significant gaseous distension of large and small bowel loops and stomach. Findings consistent with ileus. Electronically Signed   By: Norva Pavlov M.D.   On: 01/31/2019 17:12   Vas [place] Vanice Sarah With/wo Tbi  Result Date: 01/31/2019 LOWER EXTREMITY DOPPLER STUDY Indications: Post op.  Performing Technologist: Blanch Media RVS  Examination Guidelines: A complete evaluation includes at minimum, Doppler waveform signals and systolic blood pressure reading at the level of bilateral brachial, anterior tibial, and posterior tibial arteries, when vessel segments are accessible. Bilateral testing is considered an integral part of a complete examination. Photoelectric Plethysmograph (PPG) waveforms and toe systolic pressure readings are included as required and additional duplex testing as needed. Limited examinations for reoccurring indications may be performed as noted.  ABI Findings: +--------+------------------+-----+-------------------+--------+ Right   Rt Pressure (mmHg)IndexWaveform           Comment  +--------+------------------+-----+-------------------+--------+ [id number]                    triphasic                   +--------+------------------+-----+-------------------+--------+ PTA     67                0.63 dampened monophasic         +--------+------------------+-----+-------------------+--------+ DP                             absent                      +--------+------------------+-----+-------------------+--------+ +--------+------------------+-----+-------------------+-------+ Left    Lt Pressure  (mmHg)IndexWaveform           Comment +--------+------------------+-----+-------------------+-------+ Brachial  triphasic                  +--------+------------------+-----+-------------------+-------+ PTA     68                0.64 dampened monophasic        +--------+------------------+-----+-------------------+-------+ DP      73                0.68 dampened monophasic        +--------+------------------+-----+-------------------+-------+ +-------+-----------+-----------+------------+------------+ ABI/TBIToday's ABIToday's TBIPrevious ABIPrevious TBI +-------+-----------+-----------+------------+------------+ Right  0.63                                           +-------+-----------+-----------+------------+------------+ Left   0.68                                           +-------+-----------+-----------+------------+------------+  Summary: Right: Resting right ankle-brachial index indicates moderate right lower extremity arterial disease. Left: Resting left ankle-brachial index indicates moderate left lower extremity arterial disease.  *See table(s) above for measurements and observations.  Electronically signed by Sherald Hesshristopher Clark MD on 01/31/2019 at 2:52:37 PM.   Final    Vas [place] Renal Artery Duplex  Result Date: 01/31/2019 ABDOMINAL VISCERAL Indications: Acute renal failure Limitations: Air/bowel gas. Performing Technologist: Jeb LeveringJill Parker RDMS, RVT  Examination Guidelines: A complete evaluation includes B-mode imaging, spectral Doppler, color Doppler, and power Doppler as needed of all accessible portions of each vessel. Bilateral testing is considered an integral part of a complete examination. Limited examinations for reoccurring indications may be performed as noted.  Duplex Findings:  Mesenteric Technologist observations: Aorta not visualized.  Technologist observations Renal Artery(s):Bilateral kidneys not visualized due to excessive  overlying bowel gas and fluid filled bowel.  Summary: Renal:   Inconclusive study, see above.  *See table(s) above for measurements and observations.  Diagnosing physician: Sherald Hesshristopher Clark MD  Electronically signed by Sherald Hesshristopher Clark MD on 01/31/2019 at 2:51:48 PM.    Final     Cardiac Studies   Echo 02/01/19:  1. The left ventricle has normal systolic function with an ejection fraction of 60-65%. The cavity size was normal. There is moderately increased left ventricular wall thickness. Left ventricular diastology could not be evaluated secondary to atrial  fibrillation.  2. The right ventricle has normal systolic function. The cavity was normal. There is no increase in right ventricular wall thickness.  3. The mitral valve is normal in structure.  4. The tricuspid valve is normal in structure.  5. Probably trileaflet Mild thickening of the aortic valve Moderate calcification of the aortic valve. no stenosis of the aortic valve.  6. The pulmonic valve was normal in structure.  7. 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  8. 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  9. Right atrial pressure is estimated at 3 mmHg.  Patient Profile     68 y.o. male with a hx of atrial fibrillation and tachy-brady syndrome who has refused PPM in the past, chronic anticoagulation therapy w/ Coumadin, h/o CVA,  CKD and PVD admitted for elective Bi-Femoral bypass, who is being followed for the evaluation of atrial fibrillation w/ RVR at the request of Dr. Darrick PennaFields, Vascular Surgery.   Assessment & Plan  Post op atrial fibrillation: was not controlled on max dose dilt drip, and with hypotension, changed to amiodarone 2/14. Still with rates just over 100 bpm but gradually improving. Echo with preserved EF, low right atrial pressure -has had large volume NG output, >1 L/day, but is also making better urine. Some of this may be driven by volume status. Received blood and IV  fluid overnight. Would continue to keep him at least even if not positive on fluid balance with IV fluid boluses, especially given use of pressors. Dehydration will worsen tachycardia. -once able to take oral medications, would prefer to stop the amiodarone given prior bradycardia on it. Digoxin will be limited by renal function, but if blood pressure allows could restart oral metoprolol when stable. If he converts to sinus on amiodarone, would stop amiodarone drip and monitor -currently warfarin on hold, on IV heparin. Will need to monitor warfarin dosing if he remains on amiodarone for any length of time. From an afib standpoint, if heparin needs to be stopped for occult blood loss, this is fine for now. Defer to Dr. Darrick Penna for heparin post vascular surgery.  Hypotension: now on phenylephrine. This limits ability to use metoprolol or diltiazem for rate control.   ASCVD: history of CVA, PAD s/p aortobifemoral bypass, bilateral common femoral endarterectomy and profundoplasty, bilateral iliac thrombectomy on 01/28/19  Acute kidney injury: Cr improved slightly today to 3.28, from peak of 4.15. Urine output improved. Nephrology managing this, but I suspect as ileus and volume status improve, his renal function will improve as well.  CRITICAL CARE Patient is critically ill with multiple organ systems affected and requires high complexity decision making. Total critical care time: 35 minutes. This time includes gathering of history, evaluation of patient's response to treatment, examination of patient, review of laboratory and imaging studies, and coordination with consultants.   For questions or updates, please contact [facility] Please consult [email or web address] for contact info under     Signed, Jodelle Red, MD  02/02/2019, 9:14 AM

## 2019-02-02 NOTE — Progress Notes (Addendum)
ANTICOAGULATION CONSULT NOTE  Pharmacy Consult:  Heparin Indication: atrial fibrillation  No Known Allergies  Patient Measurements: Height: 6\' 5"  (195.6 cm) Weight: 199 lb 8.3 oz (90.5 kg) IBW/kg (Calculated) : 89.1  Heparin dosing weight = 84 kg  Vital Signs: Temp: 97.7 F (36.5 C) (02/15 0746) Temp Source: Oral (02/15 0746) BP: 99/77 (02/15 1000) Pulse Rate: 64 (02/15 0539)  Labs: Recent Labs    01/31/19 0556 01/31/19 0835 01/31/19 1316  01/31/19 2009 02/01/19 0238 02/01/19 1127 02/01/19 1805 02/01/19 2353 02/02/19 0743  HGB 11.9*  --   --    < >  --  9.3*  --   --  8.3* 9.8*  HCT 36.3*  --   --    < >  --  29.1*  --   --  24.9* 29.4*  PLT 162  --   --    < >  --  162  --   --  157 141*  LABPROT 15.8*  --   --   --   --  17.2*  --   --   --  18.2*  INR 1.27  --   --   --   --  1.42  --   --   --  1.53  HEPARINUNFRC 0.74*  --   --   --   --  <0.10*  --  0.18*  --  0.29*  CREATININE 2.67*  --   --   --  3.94* 4.15* 4.04*  --   --  3.27*  TROPONINI  --  <0.03 <0.03  --  <0.03  --   --   --   --   --    < > = values in this interval not displayed.    Estimated Creatinine Clearance: 26.9 mL/min (A) (by C-G formula based on SCr of 3.27 mg/dL (H)).  Assessment: 53 YOM s/p aortabifem bypass and has been on heparin infusion. Heparin was discontinued 2/13 due to drop in hemoglobin (13.3 on 2/11 to 10.5 on 2/13). Last dose of Coumadin was 2/12 and on hold due to ileus. Pharmacy consulted to resume heparin with no bolus.  Heparin level is just below goal this morning. No issue with heparin infusion and no bleeding per RN, dark gastic secretions. CBC ok, no overt bleeding noted but given his need for transfusion overnight will leave rate at 1200/hr for now.   Goal of Therapy:  Heparin level 0.3-0.5 units/mL Monitor platelets by anticoagulation protocol: Yes   Plan:  Keep heparin at 1200/hr for now Daily heparin level   Sheppard Coil PharmD., BCPS Clinical  Pharmacist 02/02/2019 11:04 AM

## 2019-02-02 NOTE — Progress Notes (Signed)
[facility]  Subjective  - feels ok   Objective (!) 115/104 64 97.7 F (36.5 C) (Oral) 14 97%  Intake/Output Summary (Last 24 hours) at 02/02/2019 0841 Last data filed at 02/02/2019 0700 Gross per 24 hour  Intake 3908.14 ml  Output 4170 ml  Net -261.86 ml   Abdomen soft incisions clean   Extremities feet warm  Assessment/Planning: Acute blood loss anemia transfused last night.  Difficult to know if this is ongoing ooze or dilutional.  Will keep heparin for now trend Hgb  Acute renal failure.  Creatinine has plateaued.  Continues to make urine.  Nephrology to see later today.  ? Stop bicarb drip  Aortobifem post op ileus keep NG.  Would be nice to get him out of bed today  [name] 02/02/2019 8:41 AM --  Laboratory Lab Results: Recent Labs    02/01/19 0238 02/01/19 2353  WBC 7.7 6.5  HGB 9.3* 8.3*  HCT 29.1* 24.9*  PLT 162 157   BMET Recent Labs    02/01/19 0238 02/01/19 1127  NA 135 137  K 4.5 3.9  CL 105 101  CO2 20* 22  GLUCOSE 133* 125*  BUN 51* 53*  CREATININE 4.15* 4.04*  CALCIUM 7.4* 7.6*    COAG Lab Results  Component Value Date   INR 1.42 02/01/2019   INR 1.27 01/31/2019   INR 1.16 01/28/2019   No results found for: PTT

## 2019-02-02 NOTE — Progress Notes (Signed)
[facility] Progress Note   Subjective:   No particular complaints.  On phenyl overnight for BP, off now.   I/Os yesterday ~4L/3L.   Objective Vitals:   02/02/19 0715 02/02/19 0730 02/02/19 0746 02/02/19 0800  BP: 114/72 (!) 115/104  99/75  Pulse:      Resp: 14 14  16   Temp:   97.7 F (36.5 C)   TempSrc:   Oral   SpO2: 98% 97%  96%  Weight:      Height:       Physical Exam General: lying at 45 degrees comfortable ENT: MM dry Heart: tachy, irregular Lungs: normal WOB, coarse ant, 98% on 2L now Abdomen: soft, mod distended Extremities: no edema  Additional Objective Labs: Basic Metabolic Panel: Recent Labs  Lab 02/01/19 0238 02/01/19 1127 02/02/19 0743  NA 135 137 138  K 4.5 3.9 3.3*  CL 105 101 101  CO2 20* 22 28  GLUCOSE 133* 125* 111*  BUN 51* 53* 51*  CREATININE 4.15* 4.04* 3.27*  CALCIUM 7.4* 7.6* 7.6*  PHOS  --  3.2 3.4   Liver Function Tests: Recent Labs  Lab 01/29/19 0209 02/01/19 1127 02/02/19 0743  AST 41  --   --   ALT 35  --   --   ALKPHOS 35*  --   --   BILITOT 2.5*  --   --   PROT 5.7*  --   --   ALBUMIN 3.3* 2.5* 2.2*   Recent Labs  Lab 01/29/19 0209  AMYLASE 104*   CBC: Recent Labs  Lab 01/31/19 0556 01/31/19 1702 02/01/19 0238 02/01/19 2353 02/02/19 0743  WBC 12.0* 13.0* 7.7 6.5 6.5  HGB 11.9* 10.5* 9.3* 8.3* 9.8*  HCT 36.3* 32.1* 29.1* 24.9* 29.4*  MCV 93.8 94.1 93.3 92.2 89.6  PLT 162 137* 162 157 141*   Blood Culture    Component Value Date/Time   SDES URINE, CLEAN CATCH 08/25/2017 1026   SPECREQUEST NONE 08/25/2017 1026   CULT NO GROWTH 08/25/2017 1026   REPTSTATUS 08/26/2017 FINAL 08/25/2017 1026    Cardiac Enzymes: Recent Labs  Lab 01/31/19 0835 01/31/19 1316 01/31/19 2009  TROPONINI <0.03 <0.03 <0.03   CBG: No results for input(s): GLUCAP in the last 168 hours. Iron Studies: No results for input(s): IRON, TIBC, TRANSFERRIN, FERRITIN in the last 72  hours. @lablastinr3 @ Studies/Results: Dg Abd 1 View  Result Date: 01/31/2019 CLINICAL DATA:  NG tube placement. Hx of stroke, chronic kidney disease. EXAM: ABDOMEN - 1 VIEW COMPARISON:  01/31/2019 FINDINGS: Interval placement of nasogastric tube, tip at the UPPER aspect of the imaged, likely within the stomach. Persistent significant dilatation of large and small bowel loops. Joint space loss of the RIGHT hip. IMPRESSION: Tip of nasogastric tube likely within the stomach. Electronically Signed   By: Norva Pavlov M.D.   On: 01/31/2019 20:18   Dg Abd 1 View  Result Date: 01/31/2019 CLINICAL DATA:  Abdominal distention Pt was hurting when put in supine position Nurse assisted throughout EXAM: ABDOMEN - 1 VIEW COMPARISON:  CT of the abdomen and pelvis on 01/10/2019 FINDINGS: There is significant gaseous distension of large and small bowel loops and stomach. No evidence for free intraperitoneal air. Surgical clips overlie the midline of the abdomen. IMPRESSION: Significant gaseous distension of large and small bowel loops and stomach. Findings consistent with ileus. Electronically Signed   By: Norva Pavlov M.D.   On: 01/31/2019 17:12   Vas [place] Vanice Sarah With/wo Tbi  Result Date: 01/31/2019  LOWER EXTREMITY DOPPLER STUDY Indications: Post op.  Performing Technologist: Blanch Media RVS  Examination Guidelines: A complete evaluation includes at minimum, Doppler waveform signals and systolic blood pressure reading at the level of bilateral brachial, anterior tibial, and posterior tibial arteries, when vessel segments are accessible. Bilateral testing is considered an integral part of a complete examination. Photoelectric Plethysmograph (PPG) waveforms and toe systolic pressure readings are included as required and additional duplex testing as needed. Limited examinations for reoccurring indications may be performed as noted.  ABI Findings: +--------+------------------+-----+-------------------+--------+ Right    Rt Pressure (mmHg)IndexWaveform           Comment  +--------+------------------+-----+-------------------+--------+ [id number]                    triphasic                   +--------+------------------+-----+-------------------+--------+ PTA     67                0.63 dampened monophasic         +--------+------------------+-----+-------------------+--------+ DP                             absent                      +--------+------------------+-----+-------------------+--------+ +--------+------------------+-----+-------------------+-------+ Left    Lt Pressure (mmHg)IndexWaveform           Comment +--------+------------------+-----+-------------------+-------+ Brachial                       triphasic                  +--------+------------------+-----+-------------------+-------+ PTA     68                0.64 dampened monophasic        +--------+------------------+-----+-------------------+-------+ DP      73                0.68 dampened monophasic        +--------+------------------+-----+-------------------+-------+ +-------+-----------+-----------+------------+------------+ ABI/TBIToday's ABIToday's TBIPrevious ABIPrevious TBI +-------+-----------+-----------+------------+------------+ Right  0.63                                           +-------+-----------+-----------+------------+------------+ Left   0.68                                           +-------+-----------+-----------+------------+------------+  Summary: Right: Resting right ankle-brachial index indicates moderate right lower extremity arterial disease. Left: Resting left ankle-brachial index indicates moderate left lower extremity arterial disease.  *See table(s) above for measurements and observations.  Electronically signed by Sherald Hess MD on 01/31/2019 at 2:52:37 PM.   Final    Vas US Renal Artery Duplex  Result Date: 01/31/2019 ABDOMINAL VISCERAL Indications:  Acute renal failure Limitations: Air/bowel gas. Performing Technologist: Jeb Levering RDMS, RVT  Examination Guidelines: A complete evaluation includes B-mode imaging, spectral Doppler, color Doppler, and power Doppler as needed of all accessible portions of each vessel. Bilateral testing is considered an integral part of a complete examination. Limited examinations for reoccurring indications may be performed as noted.  Duplex Findings:  Mesenteric Technologist observations: Aorta not visualized.  Technologist observations Renal  Artery(s):Bilateral kidneys not visualized due to excessive overlying bowel gas and fluid filled bowel.  Summary: Renal:   Inconclusive study, see above.  *See table(s) above for measurements and observations.  Diagnosing physician: Sherald Hesshristopher Clark MD  Electronically signed by Sherald Hesshristopher Clark MD on 01/31/2019 at 2:51:48 PM.    Final    Medications: . amiodarone 30 mg/hr (02/02/19 0900)  . heparin 1,200 Units/hr (02/02/19 0945)  . phenylephrine (NEO-SYNEPHRINE) Adult infusion Stopped (02/02/19 0734)  .  sodium bicarbonate  infusion 1000 mL 75 mL/hr at 02/02/19 0900   . aspirin  325 mg Oral Daily  . mouth rinse  15 mL Mouth Rinse BID  . metoprolol tartrate  2.5 mg Intravenous Q6H  . pantoprazole (PROTONIX) IV  40 mg Intravenous Q24H  . sodium chloride flush  10-40 mL Intracatheter Q12H    Assessment/Plan:  **AKI, severe, oliguric:  Baseline renal function shows Cr 1.4, up to 4.15 yesterday in setting of A fib with RVR complicated by hypotension, ileus requiring NG tube possibly with fluid shift all resulting in ATN.     He's improving now with cr into the 3.2s today, UOP adequate.    He remains NPO and I think judicious IVF would be appropriate - will use NS 75/hr x 24h.  Still with significant NG output, discussed additional NS bolus with RN if outs >> ins through the day.   **Metabolic acidosis, non gap: secondary to AKI.  Currently on bicarb gtt with serum bicarb  28.  Discontinue gtt.   **Anemia:  Transfusion PRN per primary. 2u pRBC given overnight.   **Atrial fibrillation:  Defer mgmt to cardiology.   Estill BakesLindsay Danelle Curiale MD 02/02/2019, 9:56 AM  [facility] Pager: [phone]

## 2019-02-03 ENCOUNTER — Inpatient Hospital Stay (HOSPITAL_COMMUNITY): Payer: Medicare HMO

## 2019-02-03 ENCOUNTER — Inpatient Hospital Stay: Payer: Self-pay

## 2019-02-03 LAB — TYPE AND SCREEN
ABO/RH(D): A POS
Antibody Screen: NEGATIVE
Unit division: 0
Unit division: 0
Unit division: 0
Unit division: 0

## 2019-02-03 LAB — CBC
HEMATOCRIT: 28.5 % — AB (ref 39.0–52.0)
HEMOGLOBIN: 9.4 g/dL — AB (ref 13.0–17.0)
MCH: 30.2 pg (ref 26.0–34.0)
MCHC: 33 g/dL (ref 30.0–36.0)
MCV: 91.6 fL (ref 80.0–100.0)
Platelets: 162 10*3/uL (ref 150–400)
RBC: 3.11 MIL/uL — ABNORMAL LOW (ref 4.22–5.81)
RDW: 15.6 % — ABNORMAL HIGH (ref 11.5–15.5)
WBC: 6 10*3/uL (ref 4.0–10.5)
nRBC: 0 % (ref 0.0–0.2)

## 2019-02-03 LAB — BPAM RBC
Blood Product Expiration Date: 202003032359
Blood Product Expiration Date: 202003062359
Blood Product Expiration Date: 202003092359
Blood Product Expiration Date: 202003092359
ISSUE DATE / TIME: 202002121432
ISSUE DATE / TIME: 202002122226
ISSUE DATE / TIME: 202002150047
ISSUE DATE / TIME: 202002150328
UNIT TYPE AND RH: 6200
Unit Type and Rh: 6200
Unit Type and Rh: 6200
Unit Type and Rh: 6200

## 2019-02-03 LAB — URINALYSIS, COMPLETE (UACMP) WITH MICROSCOPIC
Bacteria, UA: NONE SEEN
Bilirubin Urine: NEGATIVE
GLUCOSE, UA: NEGATIVE mg/dL
KETONES UR: 5 mg/dL — AB
Leukocytes,Ua: NEGATIVE
Nitrite: NEGATIVE
Protein, ur: 30 mg/dL — AB
RBC / HPF: >50 RBC/hpf — ABNORMAL HIGH (ref 0–5)
Specific Gravity, Urine: 1.018 (ref 1.005–1.030)
pH: 6 (ref 5.0–8.0)

## 2019-02-03 LAB — BASIC METABOLIC PANEL
ANION GAP: 4 — AB (ref 5–15)
BUN: 33 mg/dL — AB (ref 8–23)
CO2: 31 mmol/L (ref 22–32)
Calcium: 7.5 mg/dL — ABNORMAL LOW (ref 8.9–10.3)
Chloride: 105 mmol/L (ref 98–111)
Creatinine, Ser: 1.78 mg/dL — ABNORMAL HIGH (ref 0.61–1.24)
GFR calc Af Amer: 44 mL/min — ABNORMAL LOW (ref >60)
GFR calc non Af Amer: 38 mL/min — ABNORMAL LOW (ref >60)
GLUCOSE: 94 mg/dL (ref 70–99)
Potassium: 3.5 mmol/L (ref 3.5–5.1)
Sodium: 140 mmol/L (ref 135–145)

## 2019-02-03 LAB — PROTIME-INR
INR: 1.6
Prothrombin Time: 18.8 seconds — ABNORMAL HIGH (ref 11.4–15.2)

## 2019-02-03 LAB — HEPARIN LEVEL (UNFRACTIONATED): Heparin Unfractionated: 0.19 IU/mL — ABNORMAL LOW (ref 0.30–0.70)

## 2019-02-03 MED ORDER — SODIUM CHLORIDE 0.9% FLUSH
10.0000 mL | INTRAVENOUS | Status: DC | PRN
  Administered 2019-02-12: 10 mL
  Filled 2019-02-03: qty 40

## 2019-02-03 MED ORDER — CHLORHEXIDINE GLUCONATE CLOTH 2 % EX PADS
6.0000 | MEDICATED_PAD | Freq: Every day | CUTANEOUS | Status: DC
  Administered 2019-02-04 – 2019-02-24 (×13): 6 via TOPICAL

## 2019-02-03 MED ORDER — SODIUM CHLORIDE 0.9% FLUSH
10.0000 mL | Freq: Two times a day (BID) | INTRAVENOUS | Status: DC
  Administered 2019-02-03 – 2019-02-15 (×11): 10 mL
  Administered 2019-02-16: 30 mL
  Administered 2019-02-16: 20 mL
  Administered 2019-02-17: 10 mL
  Administered 2019-02-17: 20 mL
  Administered 2019-02-18: 10 mL
  Administered 2019-02-18: 40 mL
  Administered 2019-02-19: 20 mL
  Administered 2019-02-19: 40 mL
  Administered 2019-02-20 – 2019-02-22 (×5): 10 mL
  Administered 2019-02-23: 30 mL
  Administered 2019-02-23: 10 mL
  Administered 2019-02-24: 30 mL
  Administered 2019-02-24 – 2019-02-28 (×4): 10 mL

## 2019-02-03 MED ORDER — SODIUM CHLORIDE 0.9 % IV SOLN
Freq: Once | INTRAVENOUS | Status: AC
  Administered 2019-02-03: 15:00:00 via INTRAVENOUS

## 2019-02-03 MED ORDER — SODIUM CHLORIDE 0.9 % IV SOLN
INTRAVENOUS | Status: DC
  Administered 2019-02-03 – 2019-02-04 (×5): via INTRAVENOUS

## 2019-02-03 MED ORDER — CHLORHEXIDINE GLUCONATE CLOTH 2 % EX PADS: 6.0000 | MEDICATED_PAD | Freq: Every day | CUTANEOUS | Status: DC

## 2019-02-03 MED ORDER — DIGOXIN 0.25 MG/ML IJ SOLN
0.1250 mg | Freq: Once | INTRAMUSCULAR | Status: AC
  Administered 2019-02-03: 0.125 mg via INTRAVENOUS
  Filled 2019-02-03: qty 2

## 2019-02-03 MED ORDER — BLISTEX MEDICATED EX OINT
TOPICAL_OINTMENT | CUTANEOUS | Status: DC | PRN
  Administered 2019-02-12: 18:00:00 via TOPICAL
  Administered 2019-02-19: 1 via TOPICAL
  Filled 2019-02-03 (×5): qty 6.3

## 2019-02-03 MED ORDER — POTASSIUM CHLORIDE 10 MEQ/100ML IV SOLN
10.0000 meq | INTRAVENOUS | Status: AC
  Administered 2019-02-03 (×3): 10 meq via INTRAVENOUS
  Filled 2019-02-03 (×3): qty 100

## 2019-02-03 NOTE — Progress Notes (Signed)
[facility]  Subjective  - awake and alert   Objective 102/75 64 97.6 F (36.4 C) (Oral) (!) 22 99%  Intake/Output Summary (Last 24 hours) at 02/03/2019 1041 Last data filed at 02/03/2019 1000 Gross per 24 hour  Intake 2466.52 ml  Output 3775 ml  Net -1308.48 ml   Abdomen soft non distended healing incisions Feet warm  Assessment/Planning: Acute renal failure resolving continue IV fluid to keep up with GI loss  Aortobifem still with ileus hopefully resolve next 1-2 days then advance diet  Afib still difficulty with rate control requiring some Neo for BP support will wean for SBP >100  Continue to mobilize and get out of bed  [facility] 02/03/2019 10:41 AM --  Laboratory Lab Results: Recent Labs    02/02/19 0743 02/03/19 0423  WBC 6.5 6.0  HGB 9.8* 9.4*  HCT 29.4* 28.5*  PLT 141* 162   BMET Recent Labs    02/02/19 0743 02/03/19 0423  NA 138 140  K 3.3* 3.5  CL 101 105  CO2 28 31  GLUCOSE 111* 94  BUN 51* 33*  CREATININE 3.27* 1.78*  CALCIUM 7.6* 7.5*    COAG Lab Results  Component Value Date   INR 1.60 02/03/2019   INR 1.53 02/02/2019   INR 1.42 02/01/2019   No results found for: PTT

## 2019-02-03 NOTE — Progress Notes (Signed)
[facility] Progress Note   Subjective:   No particular complaints.  Still A fib RVR, MAPS appropriate without vasopressor support.  I/Os yesterday ~2.2/3.45 L. UOP was 1975, NG 1500  Objective Vitals:   02/03/19 0400 02/03/19 0500 02/03/19 0600 02/03/19 0700  BP: (!) 86/61 93/64 97/64  96/72  Pulse:      Resp: 14 19 (!) 23 (!) 22  Temp:      TempSrc:      SpO2: 95% 96% 97% 97%  Weight:   86.9 kg   Height:       Physical Exam General: lying at 45 degrees comfortable ENT: MM dry Heart: tachy, irregular Lungs: normal WOB, coarse ant, 98% on 1.5L now Abdomen: soft, mod distended Extremities: no edema  Additional Objective Labs: Basic Metabolic Panel: Recent Labs  Lab 02/01/19 1127 02/02/19 0743 02/03/19 0423  NA 137 138 140  K 3.9 3.3* 3.5  CL 101 101 105  CO2 22 28 31   GLUCOSE 125* 111* 94  BUN 53* 51* 33*  CREATININE 4.04* 3.27* 1.78*  CALCIUM 7.6* 7.6* 7.5*  PHOS 3.2 3.4  --    Liver Function Tests: Recent Labs  Lab 01/29/19 0209 02/01/19 1127 02/02/19 0743  AST 41  --   --   ALT 35  --   --   ALKPHOS 35*  --   --   BILITOT 2.5*  --   --   PROT 5.7*  --   --   ALBUMIN 3.3* 2.5* 2.2*   Recent Labs  Lab 01/29/19 0209  AMYLASE 104*   CBC: Recent Labs  Lab 01/31/19 1702 02/01/19 0238 02/01/19 2353 02/02/19 0743 02/03/19 0423  WBC 13.0* 7.7 6.5 6.5 6.0  HGB 10.5* 9.3* 8.3* 9.8* 9.4*  HCT 32.1* 29.1* 24.9* 29.4* 28.5*  MCV 94.1 93.3 92.2 89.6 91.6  PLT 137* 162 157 141* 162   Blood Culture    Component Value Date/Time   SDES URINE, CLEAN CATCH 08/25/2017 1026   SPECREQUEST NONE 08/25/2017 1026   CULT NO GROWTH 08/25/2017 1026   REPTSTATUS 08/26/2017 FINAL 08/25/2017 1026    Cardiac Enzymes: Recent Labs  Lab 01/31/19 0835 01/31/19 1316 01/31/19 2009  TROPONINI <0.03 <0.03 <0.03   CBG: No results for input(s): GLUCAP in the last 168 hours. Iron Studies: No results for input(s): IRON, TIBC, TRANSFERRIN, FERRITIN in  the last 72 hours. @lablastinr3 @ Studies/Results: No results found. Medications: . sodium chloride 75 mL/hr at 02/03/19 0600  . amiodarone 30 mg/hr (02/03/19 0600)  . heparin 1,200 Units/hr (02/03/19 9892)  . phenylephrine (NEO-SYNEPHRINE) Adult infusion Stopped (02/02/19 0734)   . aspirin  325 mg Oral Daily  . mouth rinse  15 mL Mouth Rinse BID  . metoprolol tartrate  2.5 mg Intravenous Q6H  . pantoprazole (PROTONIX) IV  40 mg Intravenous Q24H  . sodium chloride flush  10-40 mL Intracatheter Q12H    Assessment/Plan:  **AKI, severe, oliguric:  Baseline renal function shows Cr 1.4, up to 4.15 2/14 in setting of A fib with RVR complicated by hypotension, ileus requiring NG tube possibly with fluid shift all resulting in ATN.     Renal function started to turn around Friday afternoon and has continued to improve to 1.78 today.  UOP has been good, even without diuretics.  He remains NPO with lots of NG output ( yesterday) and I think judicious IVF would be appropriate - will use NS 75/hr x 24h.    **Metabolic acidosis, non gap: secondary to AKI.  Resolved.  **Anemia:   now improved and stable after 2 uPRBC. Transfusion PRN per primary.  **Atrial fibrillation:  Defer mgmt to cardiology.   Will sign off in light of improvement.  Please don't hesitate to call with concerns.  Estill Bakes MD 02/03/2019, 7:33 AM  [facility] Pager: [phone]

## 2019-02-03 NOTE — Progress Notes (Signed)
ANTICOAGULATION CONSULT NOTE  Pharmacy Consult:  Heparin Indication: atrial fibrillation  No Known Allergies  Patient Measurements: Height: 6\' 5"  (195.6 cm) Weight: 191 lb 9.3 oz (86.9 kg) IBW/kg (Calculated) : 89.1  Heparin dosing weight = 84 kg  Vital Signs: Temp: 97.8 F (36.6 C) (02/15 2300) Temp Source: Oral (02/15 2300) BP: 96/72 (02/16 0700)  Labs: Recent Labs    01/31/19 0835 01/31/19 1316  01/31/19 2009  02/01/19 0238 02/01/19 1127 02/01/19 1805 02/01/19 2353 02/02/19 0743 02/03/19 0423  HGB  --   --    < >  --   --  9.3*  --   --  8.3* 9.8* 9.4*  HCT  --   --    < >  --   --  29.1*  --   --  24.9* 29.4* 28.5*  PLT  --   --    < >  --   --  162  --   --  157 141* 162  LABPROT  --   --   --   --   --  17.2*  --   --   --  18.2* 18.8*  INR  --   --   --   --   --  1.42  --   --   --  1.53 1.60  HEPARINUNFRC  --   --   --   --    < > <0.10*  --  0.18*  --  0.29* 0.19*  CREATININE  --   --    < > 3.94*  --  4.15* 4.04*  --   --  3.27* 1.78*  TROPONINI <0.03 <0.03  --  <0.03  --   --   --   --   --   --   --    < > = values in this interval not displayed.    Estimated Creatinine Clearance: 48.1 mL/min (A) (by C-G formula based on SCr of 1.78 mg/dL (H)).  Assessment: 4 YOM s/p aortabifem bypass and has been on heparin infusion. Heparin was discontinued 2/13 due to drop in hemoglobin (13.3 on 2/11 to 10.5 on 2/13). Last dose of Coumadin was 2/12 and on hold due to ileus. Pharmacy consulted to resume heparin with no bolus.  Heparin level is still below goal this morning, did not adjust rate yesterday, will adjust this morning. No issue with heparin infusion and no bleeding per RN. CBC ok, hgb down slightly to 9.4, no further transfusions needed.   Goal of Therapy:  Heparin level 0.3-0.5 units/mL Monitor platelets by anticoagulation protocol: Yes   Plan:  Increase heparin to 1300/hr  Daily heparin level   Sheppard Coil PharmD., BCPS Clinical  Pharmacist 02/03/2019 8:20 AM

## 2019-02-03 NOTE — Progress Notes (Signed)
Progress Note  Patient Name: [name] [name] Date of Encounter: 02/03/2019  Primary Cardiologist: Rollene Rotunda, MD   Subjective   Awake in bed, denies any pain. No new concerns today.  Inpatient Medications    Scheduled Meds: . aspirin  325 mg Oral Daily  . mouth rinse  15 mL Mouth Rinse BID  . metoprolol tartrate  2.5 mg Intravenous Q6H  . pantoprazole (PROTONIX) IV  40 mg Intravenous Q24H   Continuous Infusions: . sodium chloride 75 mL/hr at 02/03/19 0600  . amiodarone 30 mg/hr (02/03/19 0600)  . heparin 1,200 Units/hr (02/03/19 6122)  . phenylephrine (NEO-SYNEPHRINE) Adult infusion Stopped (02/02/19 0734)   PRN Meds: acetaminophen **OR** acetaminophen, alum & mag hydroxide-simeth, bisacodyl, guaiFENesin-dextromethorphan, hydrALAZINE, HYDROmorphone (DILAUDID) injection, labetalol, ondansetron, phenol   Vital Signs    Vitals:   02/03/19 0400 02/03/19 0500 02/03/19 0600 02/03/19 0700  BP: (!) 86/61 93/64 97/64  96/72  Pulse:      Resp: 14 19 (!) 23 (!) 22  Temp:      TempSrc:      SpO2: 95% 96% 97% 97%  Weight:   86.9 kg   Height:        Intake/Output Summary (Last 24 hours) at 02/03/2019 0815 Last data filed at 02/03/2019 0600 Gross per 24 hour  Intake 2292.5 ml  Output 3475 ml  Net -1182.5 ml   Last 3 Weights 02/03/2019 02/02/2019 01/31/2019  Weight (lbs) 191 lb 9.3 oz 199 lb 8.3 oz 193 lb 2 oz  Weight (kg) 86.9 kg 90.5 kg 87.6 kg      Telemetry    Atrial fibrillation, rare PVC, rates between 100-115 - Personally Reviewed  ECG    12/13 Coarse afib, HR 109 - Personally Reviewed  Physical Exam   GEN: appears fatigued, NG and nasal cannula in place   Neck: No JVD Cardiac: tachycardic, irregularly irregular, no murmurs, rubs, or gallops.  Respiratory: shallow respirations with minimal air movement at bases GI: Soft, still distended but improving daily MS: Trace bilateral LE edema; No deformity. Neuro:  Nonfocal  Psych: Normal affect   Labs      Chemistry Recent Labs  Lab 01/29/19 0209  02/01/19 1127 02/02/19 0743 02/03/19 0423  NA 138   < > 137 138 140  K 4.4   < > 3.9 3.3* 3.5  CL 106   < > 101 101 105  CO2 21*   < > 22 28 31   GLUCOSE 125*   < > 125* 111* 94  BUN 15   < > 53* 51* 33*  CREATININE 1.40*   < > 4.04* 3.27* 1.78*  CALCIUM 8.3*   < > 7.6* 7.6* 7.5*  PROT 5.7*  --   --   --   --   ALBUMIN 3.3*  --  2.5* 2.2*  --   AST 41  --   --   --   --   ALT 35  --   --   --   --   ALKPHOS 35*  --   --   --   --   BILITOT 2.5*  --   --   --   --   GFRNONAA 51*   < > 14* 18* 38*  GFRAA 59*   < > 16* 21* 44*  ANIONGAP 11   < > 14 9 4*   < > = values in this interval not displayed.     Hematology Recent Labs  Lab 02/01/19 2353 02/02/19  7342 02/03/19 0423  WBC 6.5 6.5 6.0  RBC 2.70* 3.28* 3.11*  HGB 8.3* 9.8* 9.4*  HCT 24.9* 29.4* 28.5*  MCV 92.2 89.6 91.6  MCH 30.7 29.9 30.2  MCHC 33.3 33.3 33.0  RDW 14.6 14.9 15.6*  PLT 157 141* 162    Cardiac Enzymes Recent Labs  Lab 01/31/19 0835 01/31/19 1316 01/31/19 2009  TROPONINI <0.03 <0.03 <0.03   No results for input(s): TROPIPOC in the last 168 hours.   BNPNo results for input(s): BNP, PROBNP in the last 168 hours.   DDimer No results for input(s): DDIMER in the last 168 hours.   Radiology    No results found.  Cardiac Studies   Echo 02/01/19:  1. The left ventricle has normal systolic function with an ejection fraction of 60-65%. The cavity size was normal. There is moderately increased left ventricular wall thickness. Left ventricular diastology could not be evaluated secondary to atrial  fibrillation.  2. The right ventricle has normal systolic function. The cavity was normal. There is no increase in right ventricular wall thickness.  3. The mitral valve is normal in structure.  4. The tricuspid valve is normal in structure.  5. Probably trileaflet Mild thickening of the aortic valve Moderate calcification of the aortic valve. no stenosis of  the aortic valve.  6. The pulmonic valve was normal in structure.  7. 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  8. 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  9. Right atrial pressure is estimated at 3 mmHg.  Patient Profile     68 y.o. male with a hx of atrial fibrillation and tachy-brady syndrome who has refused PPM in the past, chronic anticoagulation therapy w/ Coumadin, h/o CVA,  CKD and PVD admitted for elective Bi-Femoral bypass, who is being followed for the evaluation of atrial fibrillation w/ RVR at the request of Dr. Darrick Penna, Vascular Surgery.   Assessment & Plan    Post op atrial fibrillation: was not controlled on max dose dilt drip, and with hypotension, changed to amiodarone 2/14. Rates now predominantly around 100-105 bpm, which is improved. -Echo with preserved EF, low right atrial pressure -Improving with increased fluids and blood transfusion, likely exacerbated by hypovolemia -once able to take oral medications, would prefer to stop the amiodarone given prior bradycardia on it. Digoxin will be limited by renal function, but if blood pressure allows could restart oral metoprolol when stable. If he converts to sinus on amiodarone, would stop amiodarone drip and monitor -currently warfarin on hold, on IV heparin. Will need to monitor warfarin dosing if he remains on amiodarone for any length of time. -would keep K>4, Mg>2. Repleting K with 30 mEq IV today. -if renal function continues to improve, may be able to use some digoxin for rate control as well.  Hypotension: Recently titrated off pressors, but blood pressure remains soft. This limits ability to use metoprolol or diltiazem for rate control.   ASCVD: history of CVA, PAD s/p aortobifemoral bypass, bilateral common femoral endarterectomy and profundoplasty, bilateral iliac thrombectomy on 01/28/19  Acute kidney injury: Cr improved today to 1.78, from peak of 4.15. Urine output of  almost 4L, question possibly post ATN diuresis. Agree with continued IV fluids to keep at least even given autodiuresis and NG output.  CRITICAL CARE Patient is critically ill with multiple organ systems affected and requires high complexity decision making. Total critical care time: 35 minutes. This time includes gathering of history, evaluation  of patient's response to treatment, examination of patient, review of laboratory and imaging studies, and coordination with consultants.   For questions or updates, please contact [facility] Please consult [email or web address] for contact info under     Signed, Jodelle RedBridgette Braycen Burandt, MD  02/03/2019, 8:15 AM

## 2019-02-03 NOTE — Progress Notes (Signed)
Called about pt with increasing Neo titration  Pt states he feels fine.  Incisions look clean.  No real change in NG output.   Urine slightly amber  Will give a saline bolus now Check UA urine culture  [name], MD [facility] Office: [phone] Pager: [phone]

## 2019-02-04 ENCOUNTER — Encounter (HOSPITAL_COMMUNITY): Payer: Self-pay

## 2019-02-04 LAB — BASIC METABOLIC PANEL
Anion gap: 9 (ref 5–15)
Anion gap: 10 (ref 5–15)
BUN: 22 mg/dL (ref 8–23)
BUN: 17 mg/dL (ref 8–23)
CO2: 24 mmol/L (ref 22–32)
CO2: 20 mmol/L — ABNORMAL LOW (ref 22–32)
CREATININE: 1.33 mg/dL — AB (ref 0.61–1.24)
Calcium: 7.5 mg/dL — ABNORMAL LOW (ref 8.9–10.3)
Calcium: 6.4 mg/dL — CL (ref 8.9–10.3)
Chloride: 108 mmol/L (ref 98–111)
Chloride: 104 mmol/L (ref 98–111)
Creatinine, Ser: 1.11 mg/dL (ref 0.61–1.24)
GFR calc Af Amer: >60 mL/min (ref >60)
GFR calc non Af Amer: 54 mL/min — ABNORMAL LOW (ref >60)
GFR calc non Af Amer: >60 mL/min (ref >60)
Glucose, Bld: 88 mg/dL (ref 70–99)
Glucose, Bld: 88 mg/dL (ref 70–99)
Potassium: 3.4 mmol/L — ABNORMAL LOW (ref 3.5–5.1)
Potassium: 3.1 mmol/L — ABNORMAL LOW (ref 3.5–5.1)
Sodium: 141 mmol/L (ref 135–145)
Sodium: 134 mmol/L — ABNORMAL LOW (ref 135–145)

## 2019-02-04 LAB — MAGNESIUM: Magnesium: 1.6 mg/dL — ABNORMAL LOW (ref 1.7–2.4)

## 2019-02-04 LAB — URINE CULTURE: Culture: NO GROWTH

## 2019-02-04 LAB — HEPARIN LEVEL (UNFRACTIONATED)
Heparin Unfractionated: 0.13 IU/mL — ABNORMAL LOW (ref 0.30–0.70)
Heparin Unfractionated: 0.22 IU/mL — ABNORMAL LOW (ref 0.30–0.70)
Heparin Unfractionated: 0.21 IU/mL — ABNORMAL LOW (ref 0.30–0.70)

## 2019-02-04 LAB — PROTIME-INR
INR: 1.53
Prothrombin Time: 18.2 seconds — ABNORMAL HIGH (ref 11.4–15.2)

## 2019-02-04 MED ORDER — POTASSIUM CHLORIDE 20 MEQ/15ML (10%) PO SOLN
30.0000 meq | ORAL | Status: AC
  Administered 2019-02-04 (×2): 30 meq
  Filled 2019-02-04 (×2): qty 30

## 2019-02-04 MED ORDER — MAGNESIUM SULFATE 2 GM/50ML IV SOLN
2.0000 g | Freq: Once | INTRAVENOUS | Status: AC
  Administered 2019-02-04: 2 g via INTRAVENOUS
  Filled 2019-02-04: qty 50

## 2019-02-04 MED ORDER — CALCIUM GLUCONATE-NACL 2-0.675 GM/100ML-% IV SOLN
2.0000 g | Freq: Once | INTRAVENOUS | Status: AC
  Administered 2019-02-04: 2000 mg via INTRAVENOUS
  Filled 2019-02-04 (×2): qty 100

## 2019-02-04 MED ORDER — POTASSIUM CHLORIDE 20 MEQ/15ML (10%) PO SOLN
40.0000 meq | Freq: Once | ORAL | Status: AC
  Administered 2019-02-04: 40 meq via ORAL
  Filled 2019-02-04: qty 30

## 2019-02-04 NOTE — Progress Notes (Signed)
SLP Cancellation Note  Patient Details Name: [name] [name] MRN: [id number] DOB: 06-04-1951   Cancelled treatment:       Reason Eval/Treat Not Completed: Other (comment) Order received for cognitive-linguistic evaluation, but per RN and chart review, pt's aphasia is from a prior stroke. No acute changes identified. SLP to sign off acutely - please reorder if there are any changes.   Virl Axe Ridhaan Dreibelbis 02/04/2019, 12:52 PM  Maxcine Ham Shykeria Sakamoto, M.A. CCC-SLP Acute Herbalist [phone] Office [phone]

## 2019-02-04 NOTE — Progress Notes (Signed)
PHARMACY - ADULT TOTAL PARENTERAL NUTRITION CONSULT NOTE   Pharmacy Consult:  TPN Indication:  Prolonged ileus  Patient Measurements: Height: 6\' 5"  (195.6 cm) Weight: 191 lb 9.3 oz (86.9 kg) IBW/kg (Calculated) : 89.1 TPN AdjBW (KG): 84.4 Body mass index is 22.72 kg/m.  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  He presented on 01/28/19 for aorta bifemoral bypass graft, femoral endarterectomy and iliac thrombectomy.  Patient was started on a clear liquid diet post-op but intake was inadequate.  NG tube placed on 01/31/19 and patient was made NPO again.  He has moderate PCM and Pharmacy consulted to manage TPN for nutrition support.  GI: PPI IV Endo: no hx DM - AM glucose WNL Insulin requirements in the past 24 hours: N/A Lytes: low Na, K 3.1 ( ordered), Mag 1.6 (2gm ordered), CoCa low at 7.84 Renal: AKI with metabolic acidosis post-op.  SCr down 1.11, BUN WNL - NS at 125/hr Pulm: stable on RA Cards: BP ok, in Afib - ASA, IV metoprolol, amiodarone gtt AC: Heparin for Afib - hgb 9s, plts normalized Hepatobil:  Neuro: PVD/CVA - PRN Dilaudid ID: [id number], WBC WNL TPN Access: PICC placed 02/03/19 TPN start date: 02/05/19  Nutritional Goals (RD rec 2/17): 2200-2400 kCal, 110-125g protein, >/= 2.2L fluid per day  Current Nutrition:  NPO  Plan:  Start TPN 2/18 as consult was received after hours Ca gluconate 2gm IV x 1 Standard TPN labs and nursing care orders   Roczen Waymire D. Laney Potash, PharmD, BCPS, BCCCP 02/04/2019, 3:15 PM

## 2019-02-04 NOTE — Progress Notes (Signed)
ANTICOAGULATION CONSULT NOTE  Pharmacy Consult:  Heparin Indication: atrial fibrillation  No Known Allergies  Patient Measurements: Height: 6\' 5"  (195.6 cm) Weight: 188 lb 7.9 oz (85.5 kg) IBW/kg (Calculated) : 89.1  Heparin dosing weight = 84 kg  Vital Signs: Temp: 98.2 F (36.8 C) (02/17 1900) Temp Source: Oral (02/17 1900) BP: 122/71 (02/17 2200)  Labs: Recent Labs    02/01/19 2353  02/02/19 0743 02/03/19 0423 02/04/19 0255 02/04/19 0256 02/04/19 1028 02/04/19 1200 02/04/19 2222  HGB 8.3*  --  9.8* 9.4*  --   --   --   --   --   HCT 24.9*  --  29.4* 28.5*  --   --   --   --   --   PLT 157  --  141* 162  --   --   --   --   --   LABPROT  --   --  18.2* 18.8* 18.2*  --   --   --   --   INR  --   --  1.53 1.60 1.53  --   --   --   --   HEPARINUNFRC  --    < > 0.29* 0.19*  --  0.13*  --  0.22* 0.21*  CREATININE  --    < > 3.27* 1.78* 1.33*  --  1.11  --   --    < > = values in this interval not displayed.    Estimated Creatinine Clearance: 76 mL/min (by C-G formula based on SCr of 1.11 mg/dL).  Assessment: 16 YOM s/p aortabifem bypass and has been on heparin infusion. Heparin was discontinued 2/13 due to drop in hemoglobin (13.3 on 2/11 to 10.5 on 2/13). Last dose of Coumadin was 2/12 and on hold due to ileus. Pharmacy consulted to resume heparin with no bolus.  2/17 PM update: heparin level low despite rate increase, no issues per RN.   Goal of Therapy:  Heparin level 0.3-0.5 units/mL Monitor platelets by anticoagulation protocol: Yes   Plan:  Increase heparin to 1700 units/hr Re-check heparin level with AM labs  Abran Duke, PharmD, BCPS Clinical Pharmacist Phone: [phone]

## 2019-02-04 NOTE — Care Management Note (Signed)
Case Management Note  Patient Details  Name: [name] [name] MRN: [id number] Date of Birth: 12/13/1951  Subjective/Objective:  68 yo male presented for s/p Aortobifemoral bypass on 2/10. 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                Action/Plan: Patient lived at home alone PTA with PT recommending SNF for rehab. The CSW following to facilitate upon medical stability. CM team will continue to follow.   Expected Discharge Date:                  Expected Discharge Plan:  Skilled Nursing Facility  In-House Referral:  Clinical Social Work  Discharge planning Services  CM Consult  Post Acute Care Choice:  NA Choice offered to:  NA  DME Arranged:  N/A DME Agency:  NA  HH Arranged:  NA HH Agency:  NA  Status of Service:  In process, will continue to follow  If discussed at Long Length of Stay Meetings, dates discussed:    Additional Comments:  Colleen Can RN, BSN, NCM-BC, ACM-RN [phone] 02/04/2019, 3:37 PM

## 2019-02-04 NOTE — Progress Notes (Signed)
Physical Therapy Treatment Patient Details Name: [name] [name] MRN: [id number] DOB: Jul 25, 1951 Today's Date: 02/04/2019    History of Present Illness Pt s/p Aortobifemoral bypass on 2/10. Pt developed afib with rvr, acute renal failure and ileus 2/12-2/13. 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     PT Comments    Pt with better HR control with activity today. Able to amb short distance with HR only to 120's. Feel pt's progress is going to be steady but a little slower due to all of his comorbidities. Pt with little support at home. Feel pt will need the longer rehab offered at SNF. Will change dc recommendation to SNF.    Follow Up Recommendations  SNF;Supervision/Assistance - 24 hour     Equipment Recommendations  Other (comment)(To be determined)    Recommendations for Other Services       Precautions / Restrictions Precautions Precautions: Fall;Other (comment)(expressive aphasia, watch HR)    Mobility  Bed Mobility Overal bed mobility: Needs Assistance Bed Mobility: Supine to Sit     Supine to sit: Mod assist     General bed mobility comments: Assist to elevate trunk into sitting and bring hips to EOB.  Transfers Overall transfer level: Needs assistance Equipment used: Rolling walker (2 wheeled) Transfers: Sit to/from [employer] Sit to Stand: +2 physical assistance;Min assist;Mod assist Stand pivot transfers: +2 physical assistance;Min assist       General transfer comment: Assist to bring hips up. From bed +2 min assist. From low recliner +2 mod assist. Bed to chair with rolling walker. Verbal cues for hand placement  Ambulation/Gait Ambulation/Gait assistance: Min assist;+2 safety/equipment Gait Distance (Feet): 12 Feet Assistive device: Rolling walker (2 wheeled) Gait Pattern/deviations: Step-through pattern;Decreased step length - right;Decreased step length - left;Shuffle Gait velocity: decr Gait  velocity interpretation: <1.31 ft/sec, indicative of household ambulator General Gait Details: Assist for balance and support. Verbal cues to incr step length and stay closer to the walker. Pt with HR to the 120's.   Stairs             Wheelchair Mobility    Modified Rankin (Stroke Patients Only)       Balance Overall balance assessment: Needs assistance Sitting-balance support: No upper extremity supported;Feet supported Sitting balance-Leahy Scale: Fair     Standing balance support: Bilateral upper extremity supported Standing balance-Leahy Scale: Poor Standing balance comment: walker and min assist for static standing                            Cognition Arousal/Alertness: Awake/alert Behavior During Therapy: WFL for tasks assessed/performed Overall Cognitive Status: Difficult to assess                                 General Comments: Believe pt is at baseline. Pt follows commands.      Exercises      General Comments        Pertinent Vitals/Pain Pain Assessment: Faces Faces Pain Scale: Hurts a little bit Pain Location: abdomen Pain Descriptors / Indicators: Grimacing Pain Intervention(s): Limited activity within patient's tolerance;Monitored during session    Home Living                      Prior Function            PT Goals (current goals can  now be found in the care plan section) Progress towards PT goals: Progressing toward goals    Frequency    Min 3X/week      PT Plan Discharge plan needs to be updated    Co-evaluation              AM-PAC PT "6 Clicks" Mobility   Outcome Measure  Help needed turning from your back to your side while in a flat bed without using bedrails?: A Lot Help needed moving from lying on your back to sitting on the side of a flat bed without using bedrails?: A Lot Help needed moving to and from a bed to a chair (including a wheelchair)?: A Lot Help needed standing  up from a chair using your arms (e.g., wheelchair or bedside chair)?: A Lot Help needed to walk in hospital room?: A Little Help needed climbing 3-5 steps with a railing? : A Little 6 Click Score: 14    End of Session   Activity Tolerance: Patient limited by fatigue Patient left: with call bell/phone within reach;in chair;with chair alarm set Nurse Communication: Mobility status PT Visit Diagnosis: Other abnormalities of gait and mobility (R26.89);Muscle weakness (generalized) (M62.81)     Time: 4827-0786 PT Time Calculation (min) (ACUTE ONLY): 22 min  Charges:  $Gait Training: 8-22 mins                     [facility] PT Acute Rehabilitation Services Pager [phone] Office [phone]    Angelina Ok [facility] 02/04/2019, 12:33 PM

## 2019-02-04 NOTE — Progress Notes (Signed)
[facility]  Subjective  - feels ok   Objective 128/67 64 98.1 F (36.7 C) (Oral) 20 97%  Intake/Output Summary (Last 24 hours) at 02/04/2019 1732 Last data filed at 02/04/2019 1700 Gross per 24 hour  Intake 4660.84 ml  Output 2325 ml  Net 2335.84 ml   Incisions healing Had BM abdomen soft  Assessment/Planning: Ileus resolving TPN for now and tomorrow probably clears tomorrow then advance Afib much better rate control today appreciate cardiology input Renal failure resolved Will d/c NG tube Most likely to 4E tomorrow  [name] 02/04/2019 5:32 PM --  Laboratory Lab Results: Recent Labs    02/02/19 0743 02/03/19 0423  WBC 6.5 6.0  HGB 9.8* 9.4*  HCT 29.4* 28.5*  PLT 141* 162   BMET Recent Labs    02/04/19 0255 02/04/19 1028  NA 141 134*  K 3.4* 3.1*  CL 108 104  CO2 24 20*  GLUCOSE 88 88  BUN 22 17  CREATININE 1.33* 1.11  CALCIUM 7.5* 6.4*    COAG Lab Results  Component Value Date   INR 1.53 02/04/2019   INR 1.60 02/03/2019   INR 1.53 02/02/2019   No results found for: PTT

## 2019-02-04 NOTE — Progress Notes (Signed)
ANTICOAGULATION CONSULT NOTE  Pharmacy Consult:  Heparin Indication: atrial fibrillation  No Known Allergies  Patient Measurements: Height: 6\' 5"  (195.6 cm) Weight: 191 lb 9.3 oz (86.9 kg) IBW/kg (Calculated) : 89.1  Heparin dosing weight = 84 kg  Vital Signs: Temp: 98.2 F (36.8 C) (02/17 1100) Temp Source: Oral (02/17 1100) BP: 127/76 (02/17 1200)  Labs: Recent Labs    02/01/19 2353  02/02/19 0743 02/03/19 0423 02/04/19 0255 02/04/19 0256 02/04/19 1028 02/04/19 1200  HGB 8.3*  --  9.8* 9.4*  --   --   --   --   HCT 24.9*  --  29.4* 28.5*  --   --   --   --   PLT 157  --  141* 162  --   --   --   --   LABPROT  --   --  18.2* 18.8* 18.2*  --   --   --   INR  --   --  1.53 1.60 1.53  --   --   --   HEPARINUNFRC  --   --  0.29* 0.19*  --  0.13*  --  0.22*  CREATININE  --    < > 3.27* 1.78* 1.33*  --  1.11  --    < > = values in this interval not displayed.    Estimated Creatinine Clearance: 77.2 mL/min (by C-G formula based on SCr of 1.11 mg/dL).  Assessment: 59 YOM s/p aortabifem bypass and has been on heparin infusion. Heparin was discontinued 2/13 due to drop in hemoglobin (13.3 on 2/11 to 10.5 on 2/13). Last dose of Coumadin was 2/12 and on hold due to ileus. Pharmacy consulted to resume heparin with no bolus.  Heparin level this afternoon came back slightly subtherapeutic at 0.22, on 1450 units/hr. CBC on last check was WNL. No s/sx of bleeding. No infusion issues.   Goal of Therapy:  Heparin level 0.3-0.5 units/mL Monitor platelets by anticoagulation protocol: Yes   Plan:  Increase heparin to 1550 units/hr Re-check heparin level in 8 hours Monitor HL, CBC, and for s/sx of bleeding daily  Sherron Monday, PharmD, BCCCP Clinical Pharmacist  Pager: [phone] Phone: [phone]

## 2019-02-04 NOTE — Progress Notes (Signed)
Afebrile, denies pain. RC afib, currently 60's. SBP stable, MAP >65. IV amio @ 30. Room air. Denies GI sx. NGT with minimal bilious out. Supp given, + large BM. NGT dc'd. NPO until tomorrow per surgery. Foley removed, condom cath on-adequate UOP. Mg, K, Ca replaced. Up to chair with 2 assist and walking in halls with pt.

## 2019-02-04 NOTE — Progress Notes (Signed)
K 3.1, Mg 1.6, Ca 6.8-MD paged for replacement orders

## 2019-02-04 NOTE — Progress Notes (Signed)
Progress Note  Patient Name: MINER TISO Date of Encounter: 02/04/2019  Primary Cardiologist: Rollene Rotunda, MD   Subjective   No chest pain; Has not benn out of bed  Inpatient Medications    Scheduled Meds: . aspirin  325 mg Oral Daily  . Chlorhexidine Gluconate Cloth  6 each Topical Daily  . mouth rinse  15 mL Mouth Rinse BID  . metoprolol tartrate  2.5 mg Intravenous Q6H  . pantoprazole (PROTONIX) IV  40 mg Intravenous Q24H  . sodium chloride flush  10-40 mL Intracatheter Q12H   Continuous Infusions: . sodium chloride 125 mL/hr at 02/04/19 0900  . amiodarone 30 mg/hr (02/04/19 0900)  . heparin 1,450 Units/hr (02/04/19 0900)  . phenylephrine (NEO-SYNEPHRINE) Adult infusion Stopped (02/03/19 1630)   PRN Meds: acetaminophen **OR** acetaminophen, bisacodyl, guaiFENesin-dextromethorphan, hydrALAZINE, HYDROmorphone (DILAUDID) injection, labetalol, lip balm, ondansetron, phenol, sodium chloride flush   Vital Signs    Vitals:   02/04/19 0600 02/04/19 0700 02/04/19 0800 02/04/19 0900  BP: 111/77 96/72 96/72  112/78  Pulse:      Resp: (!) 25 (!) 29 19 15   Temp:  98 F (36.7 C)    TempSrc:  Oral    SpO2: 95% 95% 98% 98%  Weight:      Height:        Intake/Output Summary (Last 24 hours) at 02/04/2019 0941 Last data filed at 02/04/2019 0900 Gross per 24 hour  Intake 4679.73 ml  Output 1925 ml  Net 2754.73 ml    I/O since admission: +5820  Filed Weights   01/31/19 0318 02/02/19 0600 02/03/19 0600  Weight: 87.6 kg 90.5 kg 86.9 kg    Telemetry    AF 115 - Personally Reviewed  ECG    01/31/19 ECG (independently read by me): AF at 109; LVH; NSSTT changes  Physical Exam    BP 112/78   Pulse 64   Temp 98 F (36.7 C) (Oral)   Resp 15   Ht 6\' 5"  (1.956 m)   Wt 86.9 kg   SpO2 98%   BMI 22.72 kg/m  General: Alert, oriented, no distress. NG tube in place Skin: normal turgor, no rashes, warm and dry HEENT: Normocephalic, atraumatic. Pupils equal  round and reactive to light; sclera anicteric; extraocular muscles intact;  Nose without nasal septal hypertrophy Mouth/Parynx benign; Mallinpatti scale 3 Neck: No JVD, no carotid bruits; normal carotid upstroke Lungs: clear to ausculatation and percussion; no wheezing or rales Chest wall: without tenderness to palpitation Heart: PMI not displaced, RRR, s1 s2 normal, 1/6 systolic murmur, no diastolic murmur, no rubs, gallops, thrills, or heaves Abdomen: soft, nontender; no hepatosplenomehaly, BS+; abdominal aorta nontender and not dilated by palpation. Back: no CVA tenderness Pulses 2+ Musculoskeletal: full range of motion, normal strength, no joint deformities Extremities: no clubbing cyanosis or edema, Homan's sign negative  Neurologic: grossly nonfocal; Cranial nerves grossly wnl Psychologic: Normal mood and affect   Labs    Chemistry Recent Labs  Lab 01/29/19 0209  02/01/19 1127 02/02/19 0743 02/03/19 0423 02/04/19 0255  NA 138   < > 137 138 140 141  K 4.4   < > 3.9 3.3* 3.5 3.4*  CL 106   < > 101 101 105 108  CO2 21*   < > 22 28 31 24   GLUCOSE 125*   < > 125* 111* 94 88  BUN 15   < > 53* 51* 33* 22  CREATININE 1.40*   < > 4.04* 3.27* 1.78* 1.33*  CALCIUM  8.3*   < > 7.6* 7.6* 7.5* 7.5*  PROT 5.7*  --   --   --   --   --   ALBUMIN 3.3*  --  2.5* 2.2*  --   --   AST 41  --   --   --   --   --   ALT 35  --   --   --   --   --   ALKPHOS 35*  --   --   --   --   --   BILITOT 2.5*  --   --   --   --   --   GFRNONAA 51*   < > 14* 18* 38* 54*  GFRAA 59*   < > 16* 21* 44* >60  ANIONGAP 11   < > 14 9 4* 9   < > = values in this interval not displayed.     Hematology Recent Labs  Lab 02/01/19 2353 02/02/19 0743 02/03/19 0423  WBC 6.5 6.5 6.0  RBC 2.70* 3.28* 3.11*  HGB 8.3* 9.8* 9.4*  HCT 24.9* 29.4* 28.5*  MCV 92.2 89.6 91.6  MCH 30.7 29.9 30.2  MCHC 33.3 33.3 33.0  RDW 14.6 14.9 15.6*  PLT 157 141* 162    Cardiac Enzymes Recent Labs  Lab 01/31/19 0835  01/31/19 1316 01/31/19 2009  TROPONINI <0.03 <0.03 <0.03   No results for input(s): TROPIPOC in the last 168 hours.   BNPNo results for input(s): BNP, PROBNP in the last 168 hours.   DDimer No results for input(s): DDIMER in the last 168 hours.   Lipid Panel     Component Value Date/Time   CHOL 149 08/23/2017 0425   TRIG 102 08/23/2017 0425   HDL 51 08/23/2017 0425   CHOLHDL 2.9 08/23/2017 0425   VLDL 20 08/23/2017 0425   LDLCALC 78 08/23/2017 0425     Radiology    Dg Chest Port 1 View  Result Date: 02/03/2019 CLINICAL DATA:  Evaluate PICC line insertion EXAM: PORTABLE CHEST 1 VIEW COMPARISON:  January 31, 2019 FINDINGS: There is a new right PICC line. The distal tip is difficult to see but appears to terminate in the central SVC. No pneumothorax. An NG tube terminates in the distal stomach. The cardiomediastinal silhouette is stable. There appears to be a small layering effusion with underlying opacity on the left. No other acute interval changes. IMPRESSION: 1. The new right PICC line appears to terminate in the central SVC although the distal tip is difficult to see. The NG tube terminates in the distal stomach. 2. Mild opacity in left base, probably small effusion with underlying atelectasis. Electronically Signed   By: Gerome Samavid  Williams III M.D   On: 02/03/2019 16:34   [place] Ekg Site Rite  Result Date: 02/03/2019 If Site Rite image not attached, placement could not be confirmed due to current cardiac rhythm.   Cardiac Studies   Echo 02/01/19: 1. The left ventricle has normal systolic function with an ejection fraction of 60-65%. The cavity size was normal. There is moderately increased left ventricular wall thickness. Left ventricular diastology could not be evaluated secondary to atrial  fibrillation. 2. The right ventricle has normal systolic function. The cavity was normal. There is no increase in right ventricular wall thickness. 3. The mitral valve is normal in  structure. 4. The tricuspid valve is normal in structure. 5. Probably trileaflet Mild thickening of the aortic valve Moderate calcification of the aortic valve. no stenosis of  the aortic valve. 6. The pulmonic valve was normal in structure. 7. 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 8. The ascending aorta is dilated at 2mm. 9. Right atrial pressure is estimated at 3 mmHg.  Patient Profile      68 y.o. male with a hx of atrial fibrillation and tachy-brady syndrome who has refused PPM in the past, chronic anticoagulation therapy w/ Coumadin, h/o CVA, CKD and PVD admitted for elective Bi-Femoral bypass, who is being followed for the evaluation ofatrial fibrillation w/ RVRat the request of Dr. Darrick Penna, Vascular Surgery.    Assessment & Plan    1. AF with RVR on iv amiodarone;  AF rate is still in the 110 - 120 range; BP now 110 will change iv metoprolol from 2.5 mg q 6 hrs to 2.5 mg q 4 hrs for improved rate control; on heparin  2. AKI: improving  pk Cr 4.04 on 2/14; now 1.33  3. S/P aortobifem with ileus  4. K 3.4  5. Anemia; improveing  Signed, Lennette Bihari, MD, [facility] 02/04/2019, 9:41 AM

## 2019-02-04 NOTE — Progress Notes (Signed)
Initial Nutrition Assessment  DOCUMENTATION CODES:   Non-severe (moderate) malnutrition in context of acute illness/injury  INTERVENTION:   - TPN per pharmacy  Estimated Needs: Kcal: 2200-2400 Protein: 110-125 grams Fluid: >/= 2.2 L  - Will monitor for diet advancement and supplement as appropriate  Monitor magnesium, potassium, and phosphorus daily for at least 3 days, MD to replete as needed, as pt is at risk for refeeding syndrome given malnutrition, lack of nutrition x 7 days.  NUTRITION DIAGNOSIS:   Moderate Malnutrition related to acute illness (ileus) as evidenced by energy intake < or equal to 50% for > or equal to 5 days, mild muscle depletion, mild fat depletion.  GOAL:   Patient will meet greater than or equal to 90% of their needs  MONITOR:   Diet advancement, Weight trends, Labs, I & O's, TPN  REASON FOR ASSESSMENT:   NPO/Clear Liquid Diet, Consult New TPN/TNA  ASSESSMENT:   68 year old male who presented on 2/10 for aortobifemoral bypass with bilateral common femoral endarterectomy and profundoplasty, bilateral iliac thrombectomy. PMH significant for atrial fibrillation, tachybrady syndrome, prior CVA, CKD, and PVD.  2/12 - clears 2/13 - transferred to ICU, NPO, NGT placed d/t post-op ileus  NGT remains to low intermittent suction with 250 ml x 24 hours recorded in I/O's flowsheet.  No PO intake recorded in flowsheets since 2/10. Pt NPO 2/10-211, clear liquids 2/12-2/13, heart healthy for under 12 hours on 2/13, NPO from 2/13-today.  Discussed pt with RN. Per RN, pt without bowel movement for over 1 week. RN gave suppository this AM. Sherron Monday with attending MD regarding lack of nutrition since admission. MD placed consult for initiation of TPN. Discussed with TPN pharmacist who reports TPN will not start until tomorrow due to consult placed after cut-off.  Spoke with pt at bedside who reports feeling hungry. Pt with difficulties communicating. Will attempt  to obtain more history from pt at follow-up. Pt indicates that he had a BM today; however, RN states that this is not the case.  Abdomen taut, distended.  Medications reviewed and include: Protonix, Amiodarone IVF: NS @ 125 ml/hr  Labs reviewed: potassium 3.4 (L)  UOP: 1675 ml x 24 hours NGT output: 250 ml x 24 hours I/O's: +6.2 L since admit  NUTRITION - FOCUSED PHYSICAL EXAM:    Most Recent Value  Orbital Region  Mild depletion  Upper Arm Region  Mild depletion  Thoracic and Lumbar Region  Unable to assess [abdominal distention]  Buccal Region  Mild depletion  Temple Region  Mild depletion  Clavicle Bone Region  Mild depletion  Clavicle and Acromion Bone Region  Mild depletion  Scapular Bone Region  Unable to assess  Dorsal Hand  No depletion  Patellar Region  Mild depletion  Anterior Thigh Region  No depletion  Posterior Calf Region  No depletion  Edema (RD Assessment)  Mild [BLE]  Hair  Reviewed  Eyes  Reviewed  Mouth  Reviewed  Skin  Reviewed  Nails  Reviewed       Diet Order:   Diet Order            Diet NPO time specified  Diet effective now              EDUCATION NEEDS:   No education needs have been identified at this time  Skin:  Skin Assessment: Skin Integrity Issues: Incisions: closed incisions to R groin, L groin, abdomen  Last BM:  2/10  Height:   Ht Readings from Last 1  Encounters:  01/28/19 6\' 5"  (1.956 m)    Weight:   Wt Readings from Last 1 Encounters:  02/03/19 86.9 kg    Ideal Body Weight:  94.5 kg  BMI:  Body mass index is 22.72 kg/m.  Estimated Nutritional Needs:   Kcal:  2200-2400  Protein:  110-125 grams  Fluid:  >/= 2.2 L    Earma Reading, MS, RD, LDN Inpatient Clinical Dietitian Pager: [phone] Weekend/After Hours: [phone]

## 2019-02-04 NOTE — Progress Notes (Signed)
ANTICOAGULATION CONSULT NOTE  Pharmacy Consult:  Heparin Indication: atrial fibrillation  No Known Allergies  Patient Measurements: Height: 6\' 5"  (195.6 cm) Weight: 191 lb 9.3 oz (86.9 kg) IBW/kg (Calculated) : 89.1  Heparin dosing weight = 84 kg  Vital Signs: Temp: 98.2 F (36.8 C) (02/17 0343) Temp Source: Oral (02/17 0343) BP: 108/72 (02/17 0300)  Labs: Recent Labs    02/01/19 2353 02/02/19 0743 02/03/19 0423 02/04/19 0255 02/04/19 0256  HGB 8.3* 9.8* 9.4*  --   --   HCT 24.9* 29.4* 28.5*  --   --   PLT 157 141* 162  --   --   LABPROT  --  18.2* 18.8* 18.2*  --   INR  --  1.53 1.60 1.53  --   HEPARINUNFRC  --  0.29* 0.19*  --  0.13*  CREATININE  --  3.27* 1.78* 1.33*  --     Estimated Creatinine Clearance: 64.4 mL/min (A) (by C-G formula based on SCr of 1.33 mg/dL (H)).  Assessment: 54 YOM s/p aortabifem bypass and has been on heparin infusion. Heparin was discontinued 2/13 due to drop in hemoglobin (13.3 on 2/11 to 10.5 on 2/13). Last dose of Coumadin was 2/12 and on hold due to ileus. Pharmacy consulted to resume heparin with no bolus.  2/17 AM update: heparin level low despite rate increase, no issues per RN.   Goal of Therapy:  Heparin level 0.3-0.5 units/mL Monitor platelets by anticoagulation protocol: Yes   Plan:  Increase heparin to 1450 units/hr Re-check heparin level in 8 hours  Abran Duke, PharmD, BCPS Clinical Pharmacist Phone: [phone]

## 2019-02-05 DIAGNOSIS — I7409 Other arterial embolism and thrombosis of abdominal aorta: Secondary | ICD-10-CM

## 2019-02-05 LAB — COMPREHENSIVE METABOLIC PANEL
ALK PHOS: 59 U/L (ref 38–126)
ALT: 18 U/L (ref 0–44)
ANION GAP: 10 (ref 5–15)
AST: 20 U/L (ref 15–41)
Albumin: 2.1 g/dL — ABNORMAL LOW (ref 3.5–5.0)
BUN: 18 mg/dL (ref 8–23)
CALCIUM: 7.9 mg/dL — AB (ref 8.9–10.3)
CO2: 23 mmol/L (ref 22–32)
Chloride: 108 mmol/L (ref 98–111)
Creatinine, Ser: 1.27 mg/dL — ABNORMAL HIGH (ref 0.61–1.24)
GFR calc Af Amer: >60 mL/min (ref >60)
GFR calc non Af Amer: 57 mL/min — ABNORMAL LOW (ref >60)
GLUCOSE: 145 mg/dL — AB (ref 70–99)
Potassium: 3.7 mmol/L (ref 3.5–5.1)
Sodium: 141 mmol/L (ref 135–145)
Total Bilirubin: 1.9 mg/dL — ABNORMAL HIGH (ref 0.3–1.2)
Total Protein: 5.3 g/dL — ABNORMAL LOW (ref 6.5–8.1)

## 2019-02-05 LAB — DIFFERENTIAL
Abs Immature Granulocytes: 0.41 10*3/uL — ABNORMAL HIGH (ref 0.00–0.07)
BASOS PCT: 0 %
Basophils Absolute: 0 10*3/uL (ref 0.0–0.1)
Eosinophils Absolute: 0.3 10*3/uL (ref 0.0–0.5)
Eosinophils Relative: 2 %
Immature Granulocytes: 3 %
Lymphocytes Relative: 7 %
Lymphs Abs: 1.1 10*3/uL (ref 0.7–4.0)
Monocytes Absolute: 1.1 10*3/uL — ABNORMAL HIGH (ref 0.1–1.0)
Monocytes Relative: 7 %
NEUTROS PCT: 81 %
Neutro Abs: 12.4 10*3/uL — ABNORMAL HIGH (ref 1.7–7.7)

## 2019-02-05 LAB — CBC
HCT: 31.2 % — ABNORMAL LOW (ref 39.0–52.0)
Hemoglobin: 9.6 g/dL — ABNORMAL LOW (ref 13.0–17.0)
MCH: 29.4 pg (ref 26.0–34.0)
MCHC: 30.8 g/dL (ref 30.0–36.0)
MCV: 95.7 fL (ref 80.0–100.0)
PLATELETS: 244 10*3/uL (ref 150–400)
RBC: 3.26 MIL/uL — ABNORMAL LOW (ref 4.22–5.81)
RDW: 15.1 % (ref 11.5–15.5)
WBC: 15.3 10*3/uL — ABNORMAL HIGH (ref 4.0–10.5)
nRBC: 0 % (ref 0.0–0.2)

## 2019-02-05 LAB — PREALBUMIN: Prealbumin: 7.2 mg/dL — ABNORMAL LOW (ref 18–38)

## 2019-02-05 LAB — MAGNESIUM: Magnesium: 2 mg/dL (ref 1.7–2.4)

## 2019-02-05 LAB — HEPARIN LEVEL (UNFRACTIONATED): Heparin Unfractionated: 0.37 IU/mL (ref 0.30–0.70)

## 2019-02-05 LAB — PROTIME-INR
INR: 1.51
Prothrombin Time: 18 seconds — ABNORMAL HIGH (ref 11.4–15.2)

## 2019-02-05 LAB — PHOSPHORUS: PHOSPHORUS: 2 mg/dL — AB (ref 2.5–4.6)

## 2019-02-05 LAB — TRIGLYCERIDES: Triglycerides: 79 mg/dL (ref <150)

## 2019-02-05 MED ORDER — AMIODARONE HCL IN DEXTROSE 360-4.14 MG/200ML-% IV SOLN
60.0000 mg/h | INTRAVENOUS | Status: AC
  Administered 2019-02-05 – 2019-02-06 (×2): 60 mg/h via INTRAVENOUS
  Filled 2019-02-05: qty 200

## 2019-02-05 MED ORDER — ASPIRIN 81 MG PO CHEW
81.0000 mg | CHEWABLE_TABLET | Freq: Once | ORAL | Status: AC
  Administered 2019-02-05: 81 mg via ORAL

## 2019-02-05 MED ORDER — AMIODARONE HCL 200 MG PO TABS
200.0000 mg | ORAL_TABLET | Freq: Two times a day (BID) | ORAL | Status: DC
  Administered 2019-02-05 (×2): 200 mg via ORAL
  Filled 2019-02-05: qty 1

## 2019-02-05 MED ORDER — AMIODARONE LOAD VIA INFUSION
150.0000 mg | Freq: Once | INTRAVENOUS | Status: AC
  Administered 2019-02-05: 150 mg via INTRAVENOUS
  Filled 2019-02-05: qty 83.34

## 2019-02-05 MED ORDER — BOOST / RESOURCE BREEZE PO LIQD CUSTOM
1.0000 | Freq: Three times a day (TID) | ORAL | Status: DC
  Administered 2019-02-05 (×3): 1 via ORAL

## 2019-02-05 MED ORDER — POTASSIUM PHOSPHATES 15 MMOLE/5ML IV SOLN
15.0000 mmol | Freq: Once | INTRAVENOUS | Status: AC
  Administered 2019-02-05: 15 mmol via INTRAVENOUS
  Filled 2019-02-05: qty 5

## 2019-02-05 MED ORDER — AMIODARONE HCL IN DEXTROSE 360-4.14 MG/200ML-% IV SOLN
30.0000 mg/h | INTRAVENOUS | Status: DC
  Administered 2019-02-06 – 2019-02-17 (×23): 30 mg/h via INTRAVENOUS
  Filled 2019-02-05 (×27): qty 200

## 2019-02-05 MED ORDER — POTASSIUM CHLORIDE 10 MEQ/50ML IV SOLN
10.0000 meq | INTRAVENOUS | Status: AC
  Administered 2019-02-05 (×2): 10 meq via INTRAVENOUS
  Filled 2019-02-05 (×2): qty 50

## 2019-02-05 NOTE — Progress Notes (Signed)
Nutrition Follow-up  DOCUMENTATION CODES:   Non-severe (moderate) malnutrition in context of acute illness/injury  INTERVENTION:   - Boost Breeze po TID, each supplement provides 250 kcal and 9 grams of protein  - Will monitor for diet advancement and supplement as appropriate  NUTRITION DIAGNOSIS:   Moderate Malnutrition related to acute illness (ileus) as evidenced by energy intake < or equal to 50% for > or equal to 5 days, mild muscle depletion, mild fat depletion.  Ongoing  GOAL:   Patient will meet greater than or equal to 90% of their needs  Progressing  MONITOR:   Diet advancement, Supplement acceptance, I & O's, Skin, Weight trends, Labs  REASON FOR ASSESSMENT:   NPO/Clear Liquid Diet, Consult New TPN/TNA  ASSESSMENT:   68 year old male who presented on 2/10 for aortobifemoral bypass with bilateral common femoral endarterectomy and profundoplasty, bilateral iliac thrombectomy. PMH significant for atrial fibrillation, tachybrady syndrome, prior CVA, CKD, and PVD.  2/12 - clears 2/13 - transferred to ICU, NPO, NGT placed d/t post-op ileus  2/18 - clears  Spoke with MD. Will not start TPN as pt can be advanced to clears today. NGT removed yesterday. Discussed pt with RN.  Spoke with pt who asks for apple juice. RD provided apple juice to pt. Pt amenable to trying Boost Breeze oral nutrition supplements.  Medications reviewed and include: Protonix, potassium phosphate 15 mmol once  Labs reviewed: phosphorus 2.0 (L), hemoglobin 9.6 (L), creatinine 1.27 (H)  UOP: 1200 ml x 24 hours I/O's: +10.4 L since admit  Diet Order:   Diet Order            Diet clear liquid Room service appropriate? Yes; Fluid consistency: Thin  Diet effective now              EDUCATION NEEDS:   No education needs have been identified at this time  Skin:  Skin Assessment: Skin Integrity Issues: Incisions: closed incisions to R groin, L groin, abdomen  Last BM:  2/17 large  type 6  Height:   Ht Readings from Last 1 Encounters:  01/28/19 6\' 5"  (1.956 m)    Weight:   Wt Readings from Last 1 Encounters:  02/05/19 84.5 kg    Ideal Body Weight:  94.5 kg  BMI:  Body mass index is 22.09 kg/m.  Estimated Nutritional Needs:   Kcal:  2200-2400  Protein:  110-125 grams  Fluid:  >/= 2.2 L    Earma Reading, MS, RD, LDN Inpatient Clinical Dietitian Pager: [phone] Weekend/After Hours: [phone]

## 2019-02-05 NOTE — Progress Notes (Signed)
Occupational Therapy Treatment Patient Details Name: [name] [name] MRN: [id number] DOB: 1951-10-30 Today's Date: 02/05/2019    History of present illness Pt s/p Aortobifemoral bypass on 2/10. Pt developed afib with rvr, acute renal failure and ileus 2/12-2/13. 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    OT comments  Pt demonstrates ability to complete basic transfer mod (A) toward the right side stand pivot. Pt will need incr (A) for distance with RW.    Follow Up Recommendations  [name];Supervision/Assistance - 24 hour    Equipment Recommendations  Other (comment)    Recommendations for Other Services PT consult    Precautions / Restrictions Precautions Precautions: Fall;Other (comment) Restrictions Weight Bearing Restrictions: No       Mobility Bed Mobility               General bed mobility comments: in chair on arrival  Transfers Overall transfer level: Needs assistance   Transfers: Sit to/from Stand Sit to Stand: Mod assist Stand pivot transfers: Mod assist       General transfer comment: pt able to follow 1 step commands and self adjust into the chair. pt using a lateral scoot to side back into the seat    Balance Overall balance assessment: Needs assistance   Sitting balance-Leahy Scale: Fair Sitting balance - Comments: pt with noticeable posterior pelvic tilt in chair and sliding toward leg lift of recliner on arrival pt able to activate and help correct with cues   Standing balance support: Bilateral upper extremity supported Standing balance-Leahy Scale: Fair Standing balance comment: stand pivot                           ADL either performed or assessed with clinical judgement   ADL Overall ADL's : Needs assistance/impaired                         Toilet Transfer: Moderate assistance Toilet Transfer Details (indicate cue type and reason): simulated out of chair to w/c for pending  transfer           General ADL Comments: pt requesting personal fan in the room appropriately by pointing and then making a motion toward himself     Vision       Perception     Praxis      Cognition Arousal/Alertness: Awake/alert Behavior During Therapy: WFL for tasks assessed/performed Overall Cognitive Status: Difficult to assess                                          Exercises     Shoulder Instructions       General Comments      Pertinent Vitals/ Pain       Pain Assessment: No/denies pain  Home Living                                          Prior Functioning/Environment              Frequency  Min 2X/week        Progress Toward Goals  OT Goals(current goals can now be found in the care plan section)  Progress towards OT goals: Progressing toward goals  Acute Rehab OT Goals Patient Stated Goal: none stated OT Goal Formulation: With patient Time For Goal Achievement: 02/12/19 Potential to Achieve Goals: Good ADL Goals Pt Will Perform Grooming: with modified independence;standing Pt Will Perform Lower Body Dressing: with modified independence;with adaptive equipment;sit to/from stand Pt Will Transfer to Toilet: with modified independence;bedside commode;ambulating Pt Will Perform Toileting - Clothing Manipulation and hygiene: with modified independence;sit to/from stand;sitting/lateral leans Additional ADL Goal #1: Pt will perform bed mobility with supervision in preparation for ADLs  Plan Discharge plan remains appropriate    Co-evaluation                 AM-PAC OT "6 Clicks" Daily Activity     Outcome Measure   Help from another person eating meals?: A Lot Help from another person taking care of personal grooming?: A Lot Help from another person toileting, which includes using toliet, bedpan, or urinal?: A Lot Help from another person bathing (including washing, rinsing, drying)?: A  Lot Help from another person to put on and taking off regular upper body clothing?: A Lot Help from another person to put on and taking off regular lower body clothing?: Total 6 Click Score: 11    End of Session Equipment Utilized During Treatment: Gait belt  OT Visit Diagnosis: Unsteadiness on feet (R26.81);Other abnormalities of gait and mobility (R26.89);Muscle weakness (generalized) (M62.81)   Activity Tolerance Patient tolerated treatment well   Patient Left Other (comment)(in wheelchair to transfer to new unit with RN)   Nurse Communication Mobility status        Time: 1401(1401)-1411 OT Time Calculation (min): 10 min  Charges: OT General Charges $OT Visit: 1 Visit OT Treatments $Therapeutic Activity: 8-22 mins   Mateo Flow, OTR/L  Acute Rehabilitation Services Pager: [phone] Office: [phone] .    Mateo Flow 02/05/2019, 2:51 PM

## 2019-02-05 NOTE — Progress Notes (Signed)
Neuro intact, bsl residual s/p cva. Denies pain. Room air, LSCTA, dim bases. NSR vs SB 50's. PO amio. BP stable MAP >65. Doppler pedal/pt pulses, minimal nonpitting edema. Incisions WNL, no drainage, well approximated. Tol clears without issue, denies gi sx. Abd soft, + flatus, no BM today. Void without issue. Labs WNL, phos replaced. K replaced. Up to chair with assist x 1.

## 2019-02-05 NOTE — Progress Notes (Signed)
[facility]  Subjective  - feels ok, wants food  Objective 129/82 64 98.2 F (36.8 C) (Oral) (!) 23 99%  Intake/Output Summary (Last 24 hours) at 02/05/2019 1225 Last data filed at 02/05/2019 1200 Gross per 24 hour  Intake 4984.33 ml  Output 900 ml  Net 4084.33 ml   Abdomen incision healing groin incisions healing Feet pink warm  Assessment/Planning: POD 8 aortobifem ileus resolving will try clears again today  Afib much better rate control appreciate cardiology help, transitioning to PO meds   Will restart warfarin tomorrow if tolerates liquids  Leukocytosis unknown source afebrile trend for now  [name] 02/05/2019 12:25 PM --  Laboratory Lab Results: Recent Labs    02/03/19 0423 02/05/19 0527  WBC 6.0 15.3*  HGB 9.4* 9.6*  HCT 28.5* 31.2*  PLT 162 244   BMET Recent Labs    02/04/19 1028 02/05/19 0527  NA 134* 141  K 3.1* 3.7  CL 104 108  CO2 20* 23  GLUCOSE 88 145*  BUN 17 18  CREATININE 1.11 1.27*  CALCIUM 6.4* 7.9*    COAG Lab Results  Component Value Date   INR 1.51 02/05/2019   INR 1.53 02/04/2019   INR 1.60 02/03/2019   No results found for: PTT

## 2019-02-05 NOTE — Progress Notes (Signed)
Patient to 4E room 13 at this time. Telemetry applied and CCMD notified. V/s and assessment complete. Patient oriented to room and how to call nurse with any needs.   Ernestina Columbia, RN

## 2019-02-05 NOTE — Progress Notes (Signed)
ANTICOAGULATION CONSULT NOTE  Pharmacy Consult:  Heparin Indication: atrial fibrillation  No Known Allergies  Patient Measurements: Height: 6\' 5"  (195.6 cm) Weight: 186 lb 4.6 oz (84.5 kg) IBW/kg (Calculated) : 89.1  Heparin dosing weight = 84 kg  Vital Signs: Temp: 98.2 F (36.8 C) (02/18 0300) Temp Source: Oral (02/18 0300) BP: 128/96 (02/18 0700)  Labs: Recent Labs    02/02/19 0743 02/03/19 0423 02/04/19 0255  02/04/19 1028 02/04/19 1200 02/04/19 2222 02/05/19 0500 02/05/19 0527  HGB 9.8* 9.4*  --   --   --   --   --   --  9.6*  HCT 29.4* 28.5*  --   --   --   --   --   --  31.2*  PLT 141* 162  --   --   --   --   --   --  244  LABPROT 18.2* 18.8* 18.2*  --   --   --   --   --  18.0*  INR 1.53 1.60 1.53  --   --   --   --   --  1.51  HEPARINUNFRC 0.29* 0.19*  --    < >  --  0.22* 0.21* 0.37  --   CREATININE 3.27* 1.78* 1.33*  --  1.11  --   --   --  1.27*   < > = values in this interval not displayed.    Estimated Creatinine Clearance: 65.6 mL/min (A) (by C-G formula based on SCr of 1.27 mg/dL (H)).  Assessment: 55 YOM s/p aortabifem bypass and has been on heparin infusion. Heparin was discontinued 2/13 due to drop in hemoglobin (13.3 on 2/11 to 10.5 on 2/13). Last dose of Coumadin was 2/12 and on hold due to ileus. Pharmacy consulted to resume heparin with no bolus.  Heparin level this morning came back therapeutic at 0.37, on 1700 units/hr. Hgb 9.6 (stable), plt 244. No infusion issues. No s/sx of bleeding.   Goal of Therapy:  Heparin level 0.3-0.5 units/mL Monitor platelets by anticoagulation protocol: Yes   Plan:  Continue heparin to 1700 units/hr Monitor daily HL, CBC, and for s/sx of bleeding Monitor plans to restart warfarin once ileus resovled  Sherron Monday, PharmD, BCCCP Clinical Pharmacist  Pager: [phone] Phone: [phone]

## 2019-02-05 NOTE — Progress Notes (Signed)
Progress Note  Patient Name: [name] [name] Date of Encounter: 02/05/2019  Primary Cardiologist: Rollene Rotunda, MD   Subjective   No chest pain; weak; out of bed to chair  Inpatient Medications    Scheduled Meds: . aspirin  325 mg Oral Daily  . Chlorhexidine Gluconate Cloth  6 each Topical Daily  . mouth rinse  15 mL Mouth Rinse BID  . metoprolol tartrate  2.5 mg Intravenous Q6H  . pantoprazole (PROTONIX) IV  40 mg Intravenous Q24H  . sodium chloride flush  10-40 mL Intracatheter Q12H   Continuous Infusions: . sodium chloride Stopped (02/05/19 0733)  . amiodarone 30 mg/hr (02/05/19 0800)  . heparin 1,700 Units/hr (02/05/19 0800)  . potassium PHOSPHATE IVPB (in mmol)     PRN Meds: acetaminophen **OR** acetaminophen, bisacodyl, guaiFENesin-dextromethorphan, hydrALAZINE, HYDROmorphone (DILAUDID) injection, labetalol, lip balm, ondansetron, phenol, sodium chloride flush   Vital Signs    Vitals:   02/05/19 0600 02/05/19 0700 02/05/19 0800 02/05/19 0900  BP: (!) 138/102 (!) 128/96 131/73 135/76  Pulse:      Resp: 14 14 (!) 25 (!) 25  Temp:      TempSrc:      SpO2:   99% 91%  Weight:      Height:        Intake/Output Summary (Last 24 hours) at 02/05/2019 0954 Last data filed at 02/05/2019 0901 Gross per 24 hour  Intake 4453.46 ml  Output 1400 ml  Net 3053.46 ml    I/O since admission: +8874  Filed Weights   02/03/19 0600 02/04/19 1517 02/05/19 0400  Weight: 86.9 kg 85.5 kg 84.5 kg    Telemetry    Converted to NSR at 64  - Personally Reviewed  ECG    Will repeat today  01/31/19 ECG (independently read by me): AF at 109; LVH; NSSTT changes  Physical Exam   BP 135/76   Pulse 64   Temp 98.2 F (36.8 C) (Oral)   Resp (!) 25   Ht 6\' 5"  (1.956 m)   Wt 84.5 kg   SpO2 91%   BMI 22.09 kg/m  General: Alert, oriented, no distress.  Skin: normal turgor, no rashes, warm and dry HEENT: Normocephalic, atraumatic. Pupils equal round and reactive to  light; sclera anicteric; extraocular muscles intact;  Nose without nasal septal hypertrophy Mouth/Parynx benign; Mallinpatti scale 3 Neck: No JVD, no carotid bruits; normal carotid upstroke Lungs: clear to ausculatation and percussion; no wheezing or rales Chest wall: without tenderness to palpitation Heart: PMI not displaced, RRR, s1 s2 normal, 1/6 systolic murmur, no diastolic murmur, no rubs, gallops, thrills, or heaves Abdomen: sutures stable; minimal tenderness; no hepatosplenomehaly, BS+; abdominal aorta nontender and not dilated by palpation. Back: no CVA tenderness Pulses 2+ Musculoskeletal: full range of motion, normal strength, no joint deformities Extremities: no clubbing cyanosis or edema, Homan's sign negative  Neurologic: grossly nonfocal; Cranial nerves grossly wnl Psychologic: Normal mood and affect   Labs    Chemistry Recent Labs  Lab 02/01/19 1127 02/02/19 0743  02/04/19 0255 02/04/19 1028 02/05/19 0527  NA 137 138   < > 141 134* 141  K 3.9 3.3*   < > 3.4* 3.1* 3.7  CL 101 101   < > 108 104 108  CO2 22 28   < > 24 20* 23  GLUCOSE 125* 111*   < > 88 88 145*  BUN 53* 51*   < > 22 17 18   CREATININE 4.04* 3.27*   < > 1.33*  1.11 1.27*  CALCIUM 7.6* 7.6*   < > 7.5* 6.4* 7.9*  PROT  --   --   --   --   --  5.3*  ALBUMIN 2.5* 2.2*  --   --   --  2.1*  AST  --   --   --   --   --  20  ALT  --   --   --   --   --  18  ALKPHOS  --   --   --   --   --  59  BILITOT  --   --   --   --   --  1.9*  GFRNONAA 14* 18*   < > 54* >60 57*  GFRAA 16* 21*   < > >60 >60 >60  ANIONGAP 14 9   < > 9 10 10    < > = values in this interval not displayed.     Hematology Recent Labs  Lab 02/02/19 0743 02/03/19 0423 02/05/19 0527  WBC 6.5 6.0 15.3*  RBC 3.28* 3.11* 3.26*  HGB 9.8* 9.4* 9.6*  HCT 29.4* 28.5* 31.2*  MCV 89.6 91.6 95.7  MCH 29.9 30.2 29.4  MCHC 33.3 33.0 30.8  RDW 14.9 15.6* 15.1  PLT 141* 162 244    Cardiac Enzymes Recent Labs  Lab 01/31/19 0835  01/31/19 1316 01/31/19 2009  TROPONINI <0.03 <0.03 <0.03   No results for input(s): TROPIPOC in the last 168 hours.   BNPNo results for input(s): BNP, PROBNP in the last 168 hours.   DDimer No results for input(s): DDIMER in the last 168 hours.   Lipid Panel     Component Value Date/Time   CHOL 149 08/23/2017 0425   TRIG 79 02/05/2019 0500   HDL 51 08/23/2017 0425   CHOLHDL 2.9 08/23/2017 0425   VLDL 20 08/23/2017 0425   LDLCALC 78 08/23/2017 0425     Radiology    Dg Chest Port 1 View  Result Date: 02/03/2019 CLINICAL DATA:  Evaluate PICC line insertion EXAM: PORTABLE CHEST 1 VIEW COMPARISON:  January 31, 2019 FINDINGS: There is a new right PICC line. The distal tip is difficult to see but appears to terminate in the central SVC. No pneumothorax. An NG tube terminates in the distal stomach. The cardiomediastinal silhouette is stable. There appears to be a small layering effusion with underlying opacity on the left. No other acute interval changes. IMPRESSION: 1. The new right PICC line appears to terminate in the central SVC although the distal tip is difficult to see. The NG tube terminates in the distal stomach. 2. Mild opacity in left base, probably small effusion with underlying atelectasis. Electronically Signed   By: Gerome Sam III M.D   On: 02/03/2019 16:34   [place] Ekg Site Rite  Result Date: 02/03/2019 If Site Rite image not attached, placement could not be confirmed due to current cardiac rhythm.   Cardiac Studies   Echo 02/01/19: 1. The left ventricle has normal systolic function with an ejection fraction of 60-65%. The cavity size was normal. There is moderately increased left ventricular wall thickness. Left ventricular diastology could not be evaluated secondary to atrial  fibrillation. 2. The right ventricle has normal systolic function. The cavity was normal. There is no increase in right ventricular wall thickness. 3. The mitral valve is normal in  structure. 4. The tricuspid valve is normal in structure. 5. Probably trileaflet Mild thickening of the aortic valve Moderate calcification of the aortic  valve. no stenosis of the aortic valve. 6. The pulmonic valve was normal in structure. 7. 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 8. 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 9. Right atrial pressure is estimated at 3 mmHg.  Patient Profile      68 y.o. male with a hx of atrial fibrillation and tachy-brady syndrome who has refused PPM in the past, chronic anticoagulation therapy w/ Coumadin, h/o CVA, CKD and PVD admitted for elective Bi-Femoral bypass, who is being followed for the evaluation ofatrial fibrillation w/ RVRat the request of Dr. Darrick PennaFields, Vascular Surgery.    Assessment & Plan    1. AF with RVR on iv amiodarone/iv BB;  Converted to NSR yesterday.  Will dc iv amiodarone and initially transition to 200 mg bid for several days and plan 200 mg daily. Ultimate plan to dc amiodarone as outpatient  if maintains sinus rhythm and transition to BB.   2. Anticoagulation: on heparin with plan to transition to coumadin once appropriate.  Decrease ASA to 81 mg ? Ultimate dc  2. AKI: improving  pk Cr 4.04 on 2/14;  1.33 > 1.27  3. S/P aortobifem with ileus  4. K 3.7 today  5. Anemia; improving  Probable transfer out of 2 H today  Signed, Lennette Biharihomas A. Laycee Fitzsimmons, MD, [facility] CampusFACC 02/05/2019, 9:54 AM

## 2019-02-05 NOTE — Progress Notes (Signed)
Patient was nauseated and vomited up yellow broth and red jello that he had for dinner. Zofran given per MD order.   Ernestina Columbia, RN

## 2019-02-05 NOTE — Progress Notes (Signed)
Report given to 4E RN.  

## 2019-02-05 NOTE — Progress Notes (Signed)
PHARMACY - ADULT TOTAL PARENTERAL NUTRITION CONSULT NOTE   Pharmacy Consult:  TPN Indication:  Prolonged ileus  Patient Measurements: Height: 6\' 5"  (195.6 cm) Weight: 186 lb 4.6 oz (84.5 kg) IBW/kg (Calculated) : 89.1 TPN AdjBW (KG): 84.4 Body mass index is 22.09 kg/m.  Assessment:  54 YOM with history of LE weakness and CVA from AFib.  He presented on 01/28/19 for aorta bifemoral bypass graft, femoral endarterectomy and iliac thrombectomy.  Patient was started on a clear liquid diet post-op but intake was inadequate.  NG tube placed on 01/31/19 and patient was made NPO again.  He has moderate PCM and Pharmacy consulted to manage TPN for nutrition support.  GI: ileus.  Baseline prealbumin low at 7.2, no NG O/P charted, LBM 2/17. PPI IV Endo: no hx DM - AM glucose WNL Insulin requirements in the past 24 hours: N/A Lytes: K 3.7 (goal >/= 4 for ileus, 2 runs ordered), Phos 2, others WNL Renal: AKI improving - SCr 1.27, BUN WNL - UOP 0.6 ml/kg/hr, NS at 125/hr Pulm: stable on RA Cards: BP ok, in Afib - ASA, IV metoprolol, amiodarone gtt AC: Heparin for Afib - hgb 9s, plts normalized Hepatobil: LFTs / TG WNL, tbili 1.9 Neuro: PVD/CVA - ASA, PRN Dilaudid ID: afebrile, WBC up to 15.3, UCx negative - not on abx TPN Access: PICC placed 02/03/19 TPN start date: 02/05/19  Nutritional Goals (RD rec 2/17): 2200-2400 kCal, 110-125g protein, >/= 2.2L fluid per day  Current Nutrition:  NPO  Plan:  KPhos 15 mmol IV x 1 Pharmacy will sign off as Vascular plans to initiate an oral diet and agrees not to start TPN D/C TPN labs and nursing care orders   [name] [name] D. Laney Potash, PharmD, BCPS, BCCCP 02/05/2019, 8:43 AM

## 2019-02-06 ENCOUNTER — Inpatient Hospital Stay (HOSPITAL_COMMUNITY): Payer: Medicare HMO

## 2019-02-06 DIAGNOSIS — E440 Moderate protein-calorie malnutrition: Secondary | ICD-10-CM

## 2019-02-06 LAB — BASIC METABOLIC PANEL
Anion gap: 7 (ref 5–15)
Anion gap: 8 (ref 5–15)
BUN: 14 mg/dL (ref 8–23)
BUN: 14 mg/dL (ref 8–23)
CO2: 26 mmol/L (ref 22–32)
CO2: 24 mmol/L (ref 22–32)
Calcium: 7.6 mg/dL — ABNORMAL LOW (ref 8.9–10.3)
Calcium: 7.8 mg/dL — ABNORMAL LOW (ref 8.9–10.3)
Chloride: 104 mmol/L (ref 98–111)
Chloride: 107 mmol/L (ref 98–111)
Creatinine, Ser: 1.24 mg/dL (ref 0.61–1.24)
Creatinine, Ser: 1.31 mg/dL — ABNORMAL HIGH (ref 0.61–1.24)
GFR calc Af Amer: >60 mL/min (ref >60)
GFR calc Af Amer: >60 mL/min (ref >60)
GFR calc non Af Amer: 59 mL/min — ABNORMAL LOW (ref >60)
GFR calc non Af Amer: 55 mL/min — ABNORMAL LOW (ref >60)
Glucose, Bld: 176 mg/dL — ABNORMAL HIGH (ref 70–99)
Glucose, Bld: 111 mg/dL — ABNORMAL HIGH (ref 70–99)
Potassium: 3.6 mmol/L (ref 3.5–5.1)
Potassium: 4.3 mmol/L (ref 3.5–5.1)
Sodium: 137 mmol/L (ref 135–145)
Sodium: 139 mmol/L (ref 135–145)

## 2019-02-06 LAB — PROTIME-INR
INR: 1.47
Prothrombin Time: 17.7 seconds — ABNORMAL HIGH (ref 11.4–15.2)

## 2019-02-06 LAB — HEPARIN LEVEL (UNFRACTIONATED)
Heparin Unfractionated: 1.16 IU/mL — ABNORMAL HIGH (ref 0.30–0.70)
Heparin Unfractionated: 0.44 IU/mL (ref 0.30–0.70)

## 2019-02-06 LAB — GLUCOSE, CAPILLARY: Glucose-Capillary: 101 mg/dL — ABNORMAL HIGH (ref 70–99)

## 2019-02-06 LAB — PHOSPHORUS: PHOSPHORUS: 2.6 mg/dL (ref 2.5–4.6)

## 2019-02-06 MED ORDER — SODIUM CHLORIDE 0.9 % IV SOLN
Freq: Once | INTRAVENOUS | Status: AC
  Administered 2019-02-06: 09:00:00 via INTRAVENOUS

## 2019-02-06 MED ORDER — DEXTROSE-NACL 5-0.45 % IV SOLN
INTRAVENOUS | Status: DC
  Administered 2019-02-07: via INTRAVENOUS

## 2019-02-06 MED ORDER — SODIUM PHOSPHATES 45 MMOLE/15ML IV SOLN
15.0000 mmol | Freq: Once | INTRAVENOUS | Status: AC
  Administered 2019-02-06: 15 mmol via INTRAVENOUS
  Filled 2019-02-06: qty 5

## 2019-02-06 MED ORDER — DEXTROSE-NACL 5-0.45 % IV SOLN: INTRAVENOUS | Status: AC

## 2019-02-06 MED ORDER — TRAVASOL 10 % IV SOLN
INTRAVENOUS | Status: AC
  Administered 2019-02-06: 18:00:00 via INTRAVENOUS
  Filled 2019-02-06: qty 403.2

## 2019-02-06 MED ORDER — INSULIN ASPART 100 UNIT/ML ~~LOC~~ SOLN
0.0000 [IU] | Freq: Four times a day (QID) | SUBCUTANEOUS | Status: DC
  Administered 2019-02-07 – 2019-02-10 (×6): 1 [IU] via SUBCUTANEOUS

## 2019-02-06 MED ORDER — POTASSIUM CHLORIDE 10 MEQ/100ML IV SOLN
10.0000 meq | INTRAVENOUS | Status: AC
  Administered 2019-02-06 (×4): 10 meq via INTRAVENOUS
  Filled 2019-02-06 (×4): qty 100

## 2019-02-06 MED ORDER — DEXTROSE-NACL 5-0.45 % IV SOLN
INTRAVENOUS | Status: AC
  Administered 2019-02-06: 12:00:00 via INTRAVENOUS

## 2019-02-06 NOTE — Care Management Important Message (Signed)
Important Message  Patient Details  Name: [name] [name] MRN: [id number] Date of Birth: 11-17-1951   Medicare Important Message Given:  Yes    Oralia Rud Habiba Treloar 02/06/2019, 12:23 PM

## 2019-02-06 NOTE — Progress Notes (Addendum)
Progress Note  Patient Name: [name] [name] Date of Encounter: 02/06/2019  Primary Cardiologist: Rollene Rotunda, MD   Subjective   Pt with NG tube in place. Denies SOB, palpitations, and dizziness/pre-syncope. Afib RVR, tolerating rate and rhythm well.  Inpatient Medications    Scheduled Meds: . Chlorhexidine Gluconate Cloth  6 each Topical Daily  . insulin aspart  0-9 Units Subcutaneous Q6H  . mouth rinse  15 mL Mouth Rinse BID  . pantoprazole (PROTONIX) IV  40 mg Intravenous Q24H  . sodium chloride flush  10-40 mL Intracatheter Q12H   Continuous Infusions: . sodium chloride Stopped (02/05/19 1027)  . amiodarone 30 mg/hr (02/06/19 1148)  . dextrose 5 % and 0.45% NaCl 125 mL/hr at 02/06/19 1158  . dextrose 5 % and 0.45% NaCl    . heparin 1,700 Units/hr (02/06/19 6160)  . potassium chloride 10 mEq (02/06/19 1153)  . sodium phosphate  Dextrose 5% IVPB    . TPN ADULT (ION)     PRN Meds: acetaminophen **OR** acetaminophen, bisacodyl, guaiFENesin-dextromethorphan, hydrALAZINE, HYDROmorphone (DILAUDID) injection, labetalol, lip balm, ondansetron, phenol, sodium chloride flush   Vital Signs    Vitals:   02/05/19 2345 02/06/19 0300 02/06/19 0820 02/06/19 1137  BP:  107/80 113/80 119/88  Pulse:  (!) 111 (!) 112 (!) 113  Resp:  (!) 29 (!) 23 (!) 22  Temp: 97.9 F (36.6 C) 97.7 F (36.5 C) 98.8 F (37.1 C) 99.3 F (37.4 C)  TempSrc: Oral Oral Axillary Axillary  SpO2:  97% 97% 96%  Weight:      Height:        Intake/Output Summary (Last 24 hours) at 02/06/2019 1200 Last data filed at 02/06/2019 7371 Gross per 24 hour  Intake 639.45 ml  Output 650 ml  Net -10.55 ml   Last 3 Weights 02/05/2019 02/04/2019 02/03/2019  Weight (lbs) 186 lb 4.6 oz 188 lb 7.9 oz 191 lb 9.3 oz  Weight (kg) 84.5 kg 85.5 kg 86.9 kg      Telemetry    Afib RVR 110-130s - Personally Reviewed  ECG    Afib - Personally Reviewed  Physical Exam   GEN: No acute distress.   Neck: No  JVD Cardiac: irregular rhythm, tachycardic rate Respiratory: respirations unlabored GI: Soft, nontender, non-distended  MS: No edema; No deformity. Neuro:  Nonfocal  Psych: Normal affect   Labs    Chemistry Recent Labs  Lab 02/01/19 1127 02/02/19 0743  02/05/19 0527 02/06/19 0431 02/06/19 1104  NA 137 138   < > 141 137 139  K 3.9 3.3*   < > 3.7 3.6 4.3  CL 101 101   < > 108 104 107  CO2 22 28   < > 23 26 24   GLUCOSE 125* 111*   < > 145* 176* 111*  BUN 53* 51*   < > 18 14 14   CREATININE 4.04* 3.27*   < > 1.27* 1.24 1.31*  CALCIUM 7.6* 7.6*   < > 7.9* 7.6* 7.8*  PROT  --   --   --  5.3*  --   --   ALBUMIN 2.5* 2.2*  --  2.1*  --   --   AST  --   --   --  20  --   --   ALT  --   --   --  18  --   --   ALKPHOS  --   --   --  59  --   --  BILITOT  --   --   --  1.9*  --   --   GFRNONAA 14* 18*   < > 57* 59* 55*  GFRAA 16* 21*   < > >60 >60 >60  ANIONGAP 14 9   < > 10 7 8    < > = values in this interval not displayed.     Hematology Recent Labs  Lab 02/02/19 0743 02/03/19 0423 02/05/19 0527  WBC 6.5 6.0 15.3*  RBC 3.28* 3.11* 3.26*  HGB 9.8* 9.4* 9.6*  HCT 29.4* 28.5* 31.2*  MCV 89.6 91.6 95.7  MCH 29.9 30.2 29.4  MCHC 33.3 33.0 30.8  RDW 14.9 15.6* 15.1  PLT 141* 162 244    Cardiac Enzymes Recent Labs  Lab 01/31/19 0835 01/31/19 1316 01/31/19 2009  TROPONINI <0.03 <0.03 <0.03   No results for input(s): TROPIPOC in the last 168 hours.   BNPNo results for input(s): BNP, PROBNP in the last 168 hours.   DDimer No results for input(s): DDIMER in the last 168 hours.   Radiology    Dg Abd 2 Views  Result Date: 02/06/2019 CLINICAL DATA:  Nausea, vomiting, recent abdominal surgery, ileus EXAM: ABDOMEN - 2 VIEW COMPARISON:  01/31/2019 FINDINGS: Numerous air-filled dilated loops of small bowel throughout abdomen consistent with postoperative ileus. Some of the loops are slightly less distended than on the previous exam. Stool is noted throughout the proximal  half of the colon. Minimal stool and gas in rectum. Bones demineralized. IMPRESSION: Persistent gaseous distension of small bowel loops favoring postoperative ileus, minimally improved versus previous exam. Electronically Signed   By: Ulyses Southward M.D.   On: 02/06/2019 11:36    Cardiac Studies   Echo 02/01/19: 1. The left ventricle has normal systolic function with an ejection fraction of 60-65%. The cavity size was normal. There is moderately increased left ventricular wall thickness. Left ventricular diastology could not be evaluated secondary to atrial  fibrillation. 2. The right ventricle has normal systolic function. The cavity was normal. There is no increase in right ventricular wall thickness. 3. The mitral valve is normal in structure. 4. The tricuspid valve is normal in structure. 5. Probably trileaflet Mild thickening of the aortic valve Moderate calcification of the aortic valve. no stenosis of the aortic valve. 6. The pulmonic valve was normal in structure. 7. 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 8. The ascending aorta is dilated at 85mm. 9. Right atrial pressure is estimated at 3 mmHg.   Patient Profile     68 y.o. male with a hx of atrial fibrillation and tachy-brady syndrome who has refused PPM in the past, chronic anticoagulation therapy w/ Coumadin, h/o CVA, CKD and PVD admitted for elective Bi-Femoral bypass, who is being followed for the evaluation ofatrial fibrillation w/ RVR.  Assessment & Plan    1. Atrial fibrillation with RVR - per yesterday's note - converted to NSR on 02/04/19 - he is still on IV amiodarone - started 0530 today - he is in Afib RVR today with rates 110-130s - he is tolerating the rate and rhythm well - no PO medications - long-term plan: transition to 200 mg BID x 7 days, then 200 mg daily prior to discharge - restart home lopressor when taking POs   2. Anticoagulation - on heparin  drip - will need coumadin prior to discharge - transition once appropriate per primary - Hb 9.6 - stable, no issues with bleeding  3. AKI - sCr today 1.31, K 4.3   4. S/p aortobifem complicated by ileus - primary ordered CT abdomen - NG in place     For questions or updates, please contact [facility] Please consult [email or web address] for contact info under      Signed, Marcelino Dusterngela Nicole Duke, PA  02/06/2019, 12:00 PM    Patient seen and examined. Agree with assessment and plan.  Patient is now back in atrial fibrillation with a ventricular rate at approximately 115 bpm.  NG tube is in place and he is n.p.o.  Continue IV amiodarone presently.  The metoprolol was discontinued yesterday when he converted to sinus rhythm with plans to transition to oral therapy.  If ventricular rate continues to increase resumption of low-dose IV metoprolol may be necessary again.   Lennette Biharihomas A. , MD, [facility] Hospital[facility] 02/06/2019 1:58 PM

## 2019-02-06 NOTE — Progress Notes (Signed)
Patient rhythm on atrial fibrillation ranging from 110 to 120s, confirmed with 12 leads EKG.Dr. Kym Groom made aware with order to start amiodarone drip with bolus.P.O. amiodarone discontinued.Will continue to monitor.

## 2019-02-06 NOTE — Progress Notes (Signed)
PT Cancellation Note  Patient Details Name: [name] [name] MRN: [id number] DOB: 08/22/1951   Cancelled Treatment:     Pt sleeping earlier today.  Now pt just getting NG tube and waiting for x-ray.  Nursing asked PT to hold off today.  Will attempt to see pt tomorrow.     Judson Roch 02/06/2019, 11:22 AM

## 2019-02-06 NOTE — Progress Notes (Signed)
ANTICOAGULATION CONSULT NOTE  Pharmacy Consult:  Heparin Indication: atrial fibrillation  No Known Allergies  Patient Measurements: Height: 6\' 5"  (195.6 cm) Weight: 186 lb 4.6 oz (84.5 kg) IBW/kg (Calculated) : 89.1  Heparin dosing weight = 84 kg  Vital Signs: Temp: 97.7 F (36.5 C) (02/19 0300) Temp Source: Oral (02/19 0300) BP: 107/80 (02/19 0300) Pulse Rate: 111 (02/19 0300)  Labs: Recent Labs    02/04/19 0255  02/04/19 1028  02/05/19 0500 02/05/19 0527 02/06/19 0431 02/06/19 0544  HGB  --   --   --   --   --  9.6*  --   --   HCT  --   --   --   --   --  31.2*  --   --   PLT  --   --   --   --   --  244  --   --   LABPROT 18.2*  --   --   --   --  18.0* 17.7*  --   INR 1.53  --   --   --   --  1.51 1.47  --   HEPARINUNFRC  --    < >  --    < > 0.37  --  1.16* 0.44  CREATININE 1.33*  --  1.11  --   --  1.27* 1.24  --    < > = values in this interval not displayed.    Estimated Creatinine Clearance: 67.2 mL/min (by C-G formula based on SCr of 1.24 mg/dL).  Assessment: 60 YOM s/p aortabifem bypass and has been on heparin infusion. Heparin was discontinued 2/13 due to drop in hemoglobin (13.3 on 2/11 to 10.5 on 2/13). Last dose of Coumadin was 2/12 and on hold due to ileus. Pharmacy consulted to resume heparin with no bolus 2/14.  Heparin level this morning came back therapeutic at 0.44 on 1700 units/hr. No CBC this am but H/H has been relatively stable, INR 1.47. Per [facility]VS - will resume warfarin once tolerating liquids.  Goal of Therapy:  Heparin level 0.3-0.5 units/mL Monitor platelets by anticoagulation protocol: Yes   Plan:  -Continue heparin 1700 units/hr -Monitor daily HL, CBC, and for s/sx of bleeding -Monitor plans to restart of warfarin  Fredonia Highland, PharmD, BCPS Clinical Pharmacist [phone] Please check AMION for all [facility] numbers 02/06/2019

## 2019-02-06 NOTE — Progress Notes (Signed)
PHARMACY - ADULT TOTAL PARENTERAL NUTRITION CONSULT NOTE   Pharmacy Consult:  TPN Indication:  Prolonged ileus  Patient Measurements: Height: 6\' 5"  (195.6 cm) Weight: 186 lb 4.6 oz (84.5 kg) IBW/kg (Calculated) : 89.1 TPN AdjBW (KG): 84.4 Body mass index is 22.09 kg/m.  Assessment:  41 YOM with history of LE weakness and CVA from AFib.  He presented on 01/28/19 for aorta bifemoral bypass graft, femoral endarterectomy and iliac thrombectomy.  Patient was started on a clear liquid diet post-op but intake was inadequate.  NG tube placed on 01/31/19 and patient was made NPO again.  He was started on clears on 02/05/19 and vomitted.  Pharmacy consulted to manage TPN for nutrition support.    Patient has aphasia and reports he eats primarily soup and was eating regularly up until the day of admission.  He has minimal to no intake for ~10 days and is at risk for refeeding.  GI: presumed ileus.  Baseline prealbumin low at 7.2, no NG O/P charted, LBM 2/17. PPI IV, PRN Zofran Endo: no hx DM - AM glucose WNL Insulin requirements in the past 24 hours: N/A Lytes: K 3.6 (goal >/= 4 for ileus, 4 runs ordered), others WNL Renal: AKI improving - SCr 1.24, BUN WNL - UOP 0.4 ml/kg/hr, D51/2NS at 125/hr Pulm: stable on RA Cards: BP ok, tachy, in Afib - ASA, amiodarone gtt AC: Heparin for Afib - hgb 9s, plts normalized Hepatobil: LFTs / TG WNL, tbili 1.9 (no jaundice) Neuro: PVD/CVA - ASA, PRN Dilaudid ID: [id number], WBC up to 15.3, UCx negative - not on abx TPN Access: PICC placed 02/03/19 TPN start date: 02/06/19  Nutritional Goals (RD rec 2/17): 2200-2400 kCal, 110-125g protein, >/= 2.2L fluid per day  Current Nutrition:  NPO  Plan:  Initiate TPN at 30 ml/hr (goal rate 90 ml/hr) TPN will provide 40g AA, 108g CHO and 23g ILE for a total of 759 kCal, meeting 33% of patient's needs. Electrolytes in TPN: standard, Cl:Ac 1:1 Daily multivitamin and trace elements in TPN Start sensitive SSI Q6H.  D/C if  CBGs remain controlled at goal TPN rate. Reduce D51/2NS to 95 ml/hr when TPN starts NaPhos 15 mmol IV x 1 TPN labs and nursing care orders   Remon Quinto D. Laney Potash, PharmD, BCPS, BCCCP 02/06/2019, 11:20 AM

## 2019-02-06 NOTE — Progress Notes (Signed)
[facility]  Subjective  - vomited twice   Objective 107/80 (!) 111 97.7 F (36.5 C) (Oral) (!) 29 97%  Intake/Output Summary (Last 24 hours) at 02/06/2019 0816 Last data filed at 02/06/2019 3888 Gross per 24 hour  Intake 2452.48 ml  Output 850 ml  Net 1602.48 ml   Abdomen distended non tender, soft Incisions healing  Assessment/Planning: Still with poor gut function presumably ileus Will increase IV fluids Restart TPN Will consider CT abdomen if not resolved over next 24 hours Will put NG back if he vomits again Check abdomen xray  [name] 02/06/2019 8:16 AM --  Laboratory Lab Results: Recent Labs    02/05/19 0527  WBC 15.3*  HGB 9.6*  HCT 31.2*  PLT 244   BMET Recent Labs    02/05/19 0527 02/06/19 0431  NA 141 137  K 3.7 3.6  CL 108 104  CO2 23 26  GLUCOSE 145* 176*  BUN 18 14  CREATININE 1.27* 1.24  CALCIUM 7.9* 7.6*    COAG Lab Results  Component Value Date   INR 1.47 02/06/2019   INR 1.51 02/05/2019   INR 1.53 02/04/2019   No results found for: PTT

## 2019-02-07 ENCOUNTER — Inpatient Hospital Stay (HOSPITAL_COMMUNITY): Payer: Medicare HMO

## 2019-02-07 LAB — DIFFERENTIAL
Abs Immature Granulocytes: 0.71 10*3/uL — ABNORMAL HIGH (ref 0.00–0.07)
Basophils Absolute: 0 10*3/uL (ref 0.0–0.1)
Basophils Relative: 0 %
Eosinophils Absolute: 0.5 10*3/uL (ref 0.0–0.5)
Eosinophils Relative: 3 %
Immature Granulocytes: 5 %
Lymphocytes Relative: 10 %
Lymphs Abs: 1.5 10*3/uL (ref 0.7–4.0)
Monocytes Absolute: 0.8 10*3/uL (ref 0.1–1.0)
Monocytes Relative: 5 %
NEUTROS ABS: 12.3 10*3/uL — AB (ref 1.7–7.7)
Neutrophils Relative %: 77 %

## 2019-02-07 LAB — COMPREHENSIVE METABOLIC PANEL
ALT: 27 U/L (ref 0–44)
AST: 29 U/L (ref 15–41)
Albumin: 1.8 g/dL — ABNORMAL LOW (ref 3.5–5.0)
Alkaline Phosphatase: 65 U/L (ref 38–126)
Anion gap: 8 (ref 5–15)
BUN: 15 mg/dL (ref 8–23)
CO2: 25 mmol/L (ref 22–32)
CREATININE: 1.4 mg/dL — AB (ref 0.61–1.24)
Calcium: 7.8 mg/dL — ABNORMAL LOW (ref 8.9–10.3)
Chloride: 105 mmol/L (ref 98–111)
GFR calc Af Amer: 59 mL/min — ABNORMAL LOW (ref >60)
GFR calc non Af Amer: 51 mL/min — ABNORMAL LOW (ref >60)
Glucose, Bld: 131 mg/dL — ABNORMAL HIGH (ref 70–99)
Potassium: 3.7 mmol/L (ref 3.5–5.1)
Sodium: 138 mmol/L (ref 135–145)
Total Bilirubin: 1.7 mg/dL — ABNORMAL HIGH (ref 0.3–1.2)
Total Protein: 5.1 g/dL — ABNORMAL LOW (ref 6.5–8.1)

## 2019-02-07 LAB — PHOSPHORUS: Phosphorus: 2.9 mg/dL (ref 2.5–4.6)

## 2019-02-07 LAB — PROTIME-INR
INR: 1.38
Prothrombin Time: 16.8 seconds — ABNORMAL HIGH (ref 11.4–15.2)

## 2019-02-07 LAB — CBC
HCT: 29.7 % — ABNORMAL LOW (ref 39.0–52.0)
Hemoglobin: 9.4 g/dL — ABNORMAL LOW (ref 13.0–17.0)
MCH: 29.7 pg (ref 26.0–34.0)
MCHC: 31.6 g/dL (ref 30.0–36.0)
MCV: 94 fL (ref 80.0–100.0)
Platelets: 306 10*3/uL (ref 150–400)
RBC: 3.16 MIL/uL — ABNORMAL LOW (ref 4.22–5.81)
RDW: 15.2 % (ref 11.5–15.5)
WBC: 15.9 10*3/uL — ABNORMAL HIGH (ref 4.0–10.5)
nRBC: 0 % (ref 0.0–0.2)

## 2019-02-07 LAB — GLUCOSE, CAPILLARY
Glucose-Capillary: 105 mg/dL — ABNORMAL HIGH (ref 70–99)
Glucose-Capillary: 106 mg/dL — ABNORMAL HIGH (ref 70–99)
Glucose-Capillary: 115 mg/dL — ABNORMAL HIGH (ref 70–99)
Glucose-Capillary: 129 mg/dL — ABNORMAL HIGH (ref 70–99)

## 2019-02-07 LAB — TRIGLYCERIDES: Triglycerides: 57 mg/dL (ref <150)

## 2019-02-07 LAB — PREALBUMIN: Prealbumin: 6 mg/dL — ABNORMAL LOW (ref 18–38)

## 2019-02-07 LAB — HEPARIN LEVEL (UNFRACTIONATED): HEPARIN UNFRACTIONATED: 0.37 [IU]/mL (ref 0.30–0.70)

## 2019-02-07 LAB — MAGNESIUM: Magnesium: 1.8 mg/dL (ref 1.7–2.4)

## 2019-02-07 MED ORDER — POTASSIUM CHLORIDE 10 MEQ/50ML IV SOLN
10.0000 meq | INTRAVENOUS | Status: AC
  Administered 2019-02-07 (×4): 10 meq via INTRAVENOUS
  Filled 2019-02-07 (×4): qty 50

## 2019-02-07 MED ORDER — SODIUM PHOSPHATES 45 MMOLE/15ML IV SOLN
10.0000 mmol | Freq: Once | INTRAVENOUS | Status: AC
  Administered 2019-02-07: 10 mmol via INTRAVENOUS
  Filled 2019-02-07: qty 3.33

## 2019-02-07 MED ORDER — TRAVASOL 10 % IV SOLN
INTRAVENOUS | Status: AC
  Administered 2019-02-07: 18:00:00 via INTRAVENOUS
  Filled 2019-02-07: qty 806.4

## 2019-02-07 MED ORDER — DEXTROSE-NACL 5-0.45 % IV SOLN
INTRAVENOUS | Status: DC
  Administered 2019-02-07 – 2019-02-08 (×3): via INTRAVENOUS

## 2019-02-07 MED ORDER — POTASSIUM CHLORIDE 10 MEQ/50ML IV SOLN
10.0000 meq | INTRAVENOUS | Status: DC
  Filled 2019-02-07 (×4): qty 50

## 2019-02-07 MED ORDER — MAGNESIUM SULFATE 2 GM/50ML IV SOLN
2.0000 g | Freq: Once | INTRAVENOUS | Status: AC
  Administered 2019-02-07: 2 g via INTRAVENOUS
  Filled 2019-02-07: qty 50

## 2019-02-07 NOTE — Progress Notes (Addendum)
[facility]   Subjective  - feels ok no flatus or BM   Objective 98/61 (!) 112 98.2 F (36.8 C) (Oral) 18 98%  Intake/Output Summary (Last 24 hours) at 02/07/2019 0802 Last data filed at 02/07/2019 0400 Gross per 24 hour  Intake 2288.1 ml  Output 1200 ml  Net 1088.1 ml   Abdomen soft NG tube foul smelling brown output.  It was not functioning.  I flushed it several times and shortended the tubing Groin abdomen incisions healing  Assessment/Planning: POD #10 ABF  Persistant ileus abd xray this morning no change but NG was not functioning.   NO NG OUTPUT RECORDED FROM ALL DAY YESTERDAY!!  Renal failure adequate renal output though red in color keep Foley while ileus persists  Afib hr 100s per cardiology, continue amio heparin keep lytes normal  CT abdomen pelvis am unless ileus resolves meanwhile  Continue TPN for severe protein calorie malnutrition   [name] 02/07/2019 8:02 AM --  Laboratory Lab Results: Recent Labs    02/05/19 0527 02/07/19 0239  WBC 15.3* 15.9*  HGB 9.6* 9.4*  HCT 31.2* 29.7*  PLT 244 306   BMET Recent Labs    02/06/19 1104 02/07/19 0239  NA 139 138  K 4.3 3.7  CL 107 105  CO2 24 25  GLUCOSE 111* 131*  BUN 14 15  CREATININE 1.31* 1.40*  CALCIUM 7.8* 7.8*    COAG Lab Results  Component Value Date   INR 1.38 02/07/2019   INR 1.47 02/06/2019   INR 1.51 02/05/2019   No results found for: PTT

## 2019-02-07 NOTE — Progress Notes (Signed)
Occupational Therapy Treatment Patient Details Name: [name] [name] MRN: [id number] DOB: 08/14/1951 Today's Date: 02/07/2019    History of present illness Pt s/p Aortobifemoral bypass on 2/10. Pt developed afib with rvr, acute renal failure and ileus 2/12-2/13. Pt had NG tube for ileus that was removed 2/17 and had to be replaced 2/19. 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    OT comments  Pt limited today by not feeling well. Pt up for therapy to transfer from bed to recliner. Pt modA +2 for transfer with lines/tubes and weakness. Pt using RW for support. Pt continues to be limited by UB ADL and LB ADL requiring mod to maxA overall. Pt continues to require OT skilled services for ADL, mobility and safety in [name] setting. OT to follow acutely.   Follow Up Recommendations  [name];Supervision/Assistance - 24 hour    Equipment Recommendations       Recommendations for Other Services      Precautions / Restrictions Precautions Precautions: Fall;Other (comment)(expressive aphasia, watch HR) Restrictions Weight Bearing Restrictions: No       Mobility Bed Mobility Overal bed mobility: Needs Assistance Bed Mobility: Supine to Sit     Supine to sit: Mod assist;+2 for safety/equipment     General bed mobility comments: Assist to move legs off of bed, elevate trunk into sitting and bring hips to EOB.  Transfers Overall transfer level: Needs assistance Equipment used: Rolling walker (2 wheeled) Transfers: Sit to/from [employer] Sit to Stand: Mod assist;+2 safety/equipment Stand pivot transfers: Min assist;+2 physical assistance       General transfer comment: Assist to bring hips up and for balance. Assist to place rt hand on walker. Verbal cues for hand placement.     Balance Overall balance assessment: Needs assistance Sitting-balance support: No upper extremity supported;Feet supported Sitting balance-Leahy Scale:  Fair     Standing balance support: Bilateral upper extremity supported Standing balance-Leahy Scale: Poor Standing balance comment: walker and min assist for static standing               High Level Balance Comments: guiding pt avoiding lines and tubes           ADL either performed or assessed with clinical judgement   ADL Overall ADL's : Needs assistance/impaired                                     Functional mobility during ADLs: Maximal assistance;+2 for safety/equipment;Rolling walker(transfer with limited mobility due to NG tube) General ADL Comments: Pt performing ADLs with assist for UB and LB requiring encouragement to perform any task.     Vision       Perception     Praxis      Cognition Arousal/Alertness: Awake/alert Behavior During Therapy: WFL for tasks assessed/performed Overall Cognitive Status: Difficult to assess                                 General Comments: Believe pt is at baseline. Pt follows commands.        Exercises     Shoulder Instructions       General Comments Pt performing limited ability today as he "felt crappy."    Pertinent Vitals/ Pain       Pain Assessment: Faces Faces Pain Scale: Hurts little more  Pain Location: abdomen Pain Descriptors / Indicators: Grimacing Pain Intervention(s): Limited activity within patient's tolerance;Monitored during session;Repositioned  Home Living                                          Prior Functioning/Environment              Frequency  Min 2X/week        Progress Toward Goals  OT Goals(current goals can now be found in the care plan section)  Progress towards OT goals: Progressing toward goals  Acute Rehab OT Goals Patient Stated Goal: none stated OT Goal Formulation: With patient Time For Goal Achievement: 02/12/19 Potential to Achieve Goals: Good ADL Goals Pt Will Perform Grooming: with modified  independence;standing Pt Will Perform Lower Body Dressing: with modified independence;with adaptive equipment;sit to/from stand Pt Will Transfer to Toilet: with modified independence;bedside commode;ambulating Pt Will Perform Toileting - Clothing Manipulation and hygiene: with modified independence;sit to/from stand;sitting/lateral leans Additional ADL Goal #1: Pt will perform bed mobility with supervision in preparation for ADLs  Plan Discharge plan remains appropriate    Co-evaluation    PT/OT/SLP Co-Evaluation/Treatment: Yes Reason for Co-Treatment: Complexity of the patient's impairments (multi-system involvement)   OT goals addressed during session: Strengthening/ROM      AM-PAC OT "6 Clicks" Daily Activity     Outcome Measure   Help from another person eating meals?: A Lot Help from another person taking care of personal grooming?: A Lot Help from another person toileting, which includes using toliet, bedpan, or urinal?: A Lot Help from another person bathing (including washing, rinsing, drying)?: A Lot Help from another person to put on and taking off regular upper body clothing?: A Lot Help from another person to put on and taking off regular lower body clothing?: Total 6 Click Score: 11    End of Session Equipment Utilized During Treatment: Gait belt;Rolling walker  OT Visit Diagnosis: Unsteadiness on feet (R26.81);Other abnormalities of gait and mobility (R26.89);Muscle weakness (generalized) (M62.81) Pain - Right/Left: ("all over") Pain - part of body: (all over)   Activity Tolerance Treatment limited secondary to medical complications (Comment)   Patient Left in chair;with call bell/phone within reach;with nursing/sitter in room   Nurse Communication Mobility status        Time: 1140-1158 OT Time Calculation (min): 18 min  Charges: OT General Charges $OT Visit: 1 Visit OT Treatments $Neuromuscular Re-education: 8-22 mins  [name] [name]) [name] [name]  OTR/L Acute Rehabilitation Services Pager: [phone] Office: [phone]    [name] [name] 02/07/2019, 12:28 PM

## 2019-02-07 NOTE — Progress Notes (Signed)
PHARMACY - ADULT TOTAL PARENTERAL NUTRITION CONSULT NOTE   Pharmacy Consult:  TPN Indication:  Prolonged ileus  Patient Measurements: Height: 6\' 5"  (195.6 cm) Weight: 196 lb 11.2 oz (89.2 kg) IBW/kg (Calculated) : 89.1 TPN AdjBW (KG): 84.4 Body mass index is 23.33 kg/m.  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  He presented on 01/28/19 for aorta bifemoral bypass graft, femoral endarterectomy and iliac thrombectomy.  Patient was started on a clear liquid diet post-op but intake was inadequate.  NG tube placed on 01/31/19 and patient was made NPO again.  He was started on clears on 02/05/19 and vomitted.  Pharmacy consulted to manage TPN for nutrition support.    Patient has aphasia and reports he eats primarily soup and was eating regularly up until the day of admission.  He has minimal to no intake for ~10 days and is at risk for refeeding.  GI: presumed ileus.  Prealbumin low at 6, no NG O/P charted, LBM 2/17. PPI IV, PRN Zofran Endo: no hx DM - CBGs controlled Insulin requirements in the past 24 hours: 0 unit (1 unit since TPN started) Lytes: mild refeeding - K 3.7 (goal >/= 4 for ileus/AFib), Mag down 1.8 (goal >/= 2 for ileus/Afib), others WNL Renal: SCr up 1.4, BUN WNL - UOP 0.6 ml/kg/hr, D51/2NS at 95/hr, net +11.5L Pulm: stable on RA Cards: BP ok, HR variable (in Afib) - ASA, amiodarone gtt AC: Heparin for Afib - hgb 9s, plts normalized Hepatobil: LFTs / TG WNL, tbili down to 1.7 (no jaundice) Neuro: PVD/CVA - ASA, PRN Dilaudid ID: [id number], WBC up to 15.9, UCx negative - not on abx TPN Access: PICC placed 02/03/19 TPN start date: 02/06/19  Nutritional Goals (RD rec 2/17): 2200-2400 kCal, 110-125g protein, >/= 2.2L fluid per day  Current Nutrition:  TPN  Plan:  Increase TPN to 60 ml/hr (goal rate 90 ml/hr) TPN will provide 81g AA, 216g CHO and 42g ILE for a total of 1519 kCal, meeting 66% of patient's needs. Electrolytes in TPN: Cl:Ac 1:1 -  increase all lytes with increased TPN rate Daily multivitamin and trace elements in TPN Continue sensitive SSI Q6H.  D/C if CBGs remain controlled at goal TPN rate. Change IVF to 90 ml/hr when new TPN bag starts (total fluid = 150 ml/hr per Vascular ) KCL x 4 runs Mag sulfate 2gm IV x 1 NaPhos 10 mmol IV x 1 F/U AM labs  Dalton Mille D. Laney Potash, PharmD, BCPS, BCCCP 02/07/2019, 8:35 AM

## 2019-02-07 NOTE — Progress Notes (Signed)
Progress Note  Patient Name: [name] [name] Date of Encounter: 02/07/2019  Primary Cardiologist: Rollene Rotunda, MD   Subjective   Pt with NG tube in place. Denies SOB, palpitations, and dizziness/pre-syncope. Afib RVR, tolerating rate and rhythm well.  Inpatient Medications    Scheduled Meds: . Chlorhexidine Gluconate Cloth  6 each Topical Daily  . insulin aspart  0-9 Units Subcutaneous Q6H  . mouth rinse  15 mL Mouth Rinse BID  . pantoprazole (PROTONIX) IV  40 mg Intravenous Q24H  . sodium chloride flush  10-40 mL Intracatheter Q12H   Continuous Infusions: . amiodarone 30 mg/hr (02/07/19 1030)  . dextrose 5 % and 0.45% NaCl    . heparin 1,700 Units/hr (02/07/19 1032)  . TPN ADULT (ION) 60 mL/hr at 02/07/19 1757   PRN Meds: acetaminophen **OR** acetaminophen, bisacodyl, guaiFENesin-dextromethorphan, hydrALAZINE, HYDROmorphone (DILAUDID) injection, labetalol, lip balm, ondansetron, phenol, sodium chloride flush   Vital Signs    Vitals:   02/06/19 1219 02/06/19 2207 02/07/19 0425 02/07/19 1146  BP:  105/74 98/61 119/75  Pulse:  (!) 116 (!) 112 (!) 105  Resp:  (!) 24 18 (!) 24  Temp:  98.1 F (36.7 C) 98.2 F (36.8 C) 97.7 F (36.5 C)  TempSrc:  Oral Oral Oral  SpO2:  97% 98% 100%  Weight: 89.2 kg     Height:        Intake/Output Summary (Last 24 hours) at 02/07/2019 1804 Last data filed at 02/07/2019 1753 Gross per 24 hour  Intake 1359.7 ml  Output 2100 ml  Net -740.3 ml   Last 3 Weights 02/06/2019 02/05/2019 02/04/2019  Weight (lbs) 196 lb 11.2 oz 186 lb 4.6 oz 188 lb 7.9 oz  Weight (kg) 89.223 kg 84.5 kg 85.5 kg      Telemetry    Afib RVR 110-130s - Personally Reviewed  ECG    Afib - Personally Reviewed  Physical Exam  BP 119/75 (BP Location: Left Arm)   Pulse (!) 105   Temp 97.7 F (36.5 C) (Oral)   Resp (!) 24   Ht 6\' 5"  (1.956 m)   Wt 89.2 kg   SpO2 100%   BMI 23.33 kg/m    No distress NG tube in place  No JVD Lungs without  wheezing Irreg, irreg rhythm rate ~ 110 BS+ No edema  Labs    Chemistry Recent Labs  Lab 02/02/19 0743  02/05/19 0527 02/06/19 0431 02/06/19 1104 02/07/19 0239  NA 138   < > 141 137 139 138  K 3.3*   < > 3.7 3.6 4.3 3.7  CL 101   < > 108 104 107 105  CO2 28   < > 23 26 24 25   GLUCOSE 111*   < > 145* 176* 111* 131*  BUN 51*   < > 18 14 14 15   CREATININE 3.27*   < > 1.27* 1.24 1.31* 1.40*  CALCIUM 7.6*   < > 7.9* 7.6* 7.8* 7.8*  PROT  --   --  5.3*  --   --  5.1*  ALBUMIN 2.2*  --  2.1*  --   --  1.8*  AST  --   --  20  --   --  29  ALT  --   --  18  --   --  27  ALKPHOS  --   --  59  --   --  65  BILITOT  --   --  1.9*  --   --  1.7*  GFRNONAA 18*   < > 57* 59* 55* 51*  GFRAA 21*   < > >60 >60 >60 59*  ANIONGAP 9   < > 10 7 8 8    < > = values in this interval not displayed.     Hematology Recent Labs  Lab 02/03/19 0423 02/05/19 0527 02/07/19 0239  WBC 6.0 15.3* 15.9*  RBC 3.11* 3.26* 3.16*  HGB 9.4* 9.6* 9.4*  HCT 28.5* 31.2* 29.7*  MCV 91.6 95.7 94.0  MCH 30.2 29.4 29.7  MCHC 33.0 30.8 31.6  RDW 15.6* 15.1 15.2  PLT 162 244 306    Cardiac Enzymes Recent Labs  Lab 01/31/19 2009  TROPONINI <0.03   No results for input(s): TROPIPOC in the last 168 hours.   BNPNo results for input(s): BNP, PROBNP in the last 168 hours.   DDimer No results for input(s): DDIMER in the last 168 hours.   Radiology    Dg Abd 2 Views  Result Date: 02/06/2019 CLINICAL DATA:  Nausea, vomiting, recent abdominal surgery, ileus EXAM: ABDOMEN - 2 VIEW COMPARISON:  01/31/2019 FINDINGS: Numerous air-filled dilated loops of small bowel throughout abdomen consistent with postoperative ileus. Some of the loops are slightly less distended than on the previous exam. Stool is noted throughout the proximal half of the colon. Minimal stool and gas in rectum. Bones demineralized. IMPRESSION: Persistent gaseous distension of small bowel loops favoring postoperative ileus, minimally improved  versus previous exam. Electronically Signed   By: Ulyses Southward M.D.   On: 02/06/2019 11:36   Dg Abd Portable 1v  Result Date: 02/07/2019 CLINICAL DATA:  Postop ileus. EXAM: PORTABLE ABDOMEN - 1 VIEW COMPARISON:  02/06/2019. FINDINGS: Distended loops of small and large bowel are redemonstrated, not significantly improved from yesterday's exam. Enteric tube is not visualized. Midline abdominal staples. IMPRESSION: Distended loops of small and large bowel are redemonstrated, not significantly improved from yesterday's exam. Enteric tube not visualized, unclear if this is simply excluded from the film or has been removed. Correlate clinically. Electronically Signed   By: Elsie Stain M.D.   On: 02/07/2019 09:04   Dg Abd Portable 1v  Result Date: 02/06/2019 CLINICAL DATA:  Reason for exam: Nasogastric tube placement. Hx of chronic kidney disease. Per pt no abd pain today. Unable to communicate hx and pain well. EXAM: PORTABLE ABDOMEN - 1 VIEW COMPARISON:  02/06/2019 FINDINGS: Nasogastric tube tip overlies the level of the stomach. There is persistent significant dilatation of small bowel loops, out of proportion compared large bowel loops. The dilated small bowel loops appear to have increased wall thickness compared with earlier studies. Gas and stool within nondilated loops of colon. IMPRESSION: 1. Nasogastric tube tip overlies the level of the stomach. 2. Increased wall thickness of dilated small bowel loops. 3. Consider further evaluation with CT of the abdomen and pelvis. Electronically Signed   By: Norva Pavlov M.D.   On: 02/06/2019 12:28    Cardiac Studies   Echo 02/01/19: 1. The left ventricle has normal systolic function with an ejection fraction of 60-65%. The cavity size was normal. There is moderately increased left ventricular wall thickness. Left ventricular diastology could not be evaluated secondary to atrial  fibrillation. 2. The right ventricle has normal systolic function. The  cavity was normal. There is no increase in right ventricular wall thickness. 3. The mitral valve is normal in structure. 4. The tricuspid valve is normal in structure. 5. Probably trileaflet Mild thickening of the aortic valve Moderate calcification of the  aortic valve. no stenosis of the aortic valve. 6. The pulmonic valve was normal in structure. 7. 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 8. 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 9. Right atrial pressure is estimated at 3 mmHg.   Patient Profile     68 y.o. male with a hx of atrial fibrillation and tachy-brady syndrome who has refused PPM in the past, chronic anticoagulation therapy w/ Coumadin, h/o CVA, CKD and PVD admitted for elective Bi-Femoral bypass, who is being followed for the evaluation ofatrial fibrillation w/ RVR.  Assessment & Plan    1. Atrial fibrillation with RVR - - converted to NSR on 02/04/19 - he is still on IV amiodarone - started 0530 today - recurrent  Afib RVR 2/19 with rates 110-130s - Atrial fibrillation today with rate currently 105-110.  He continues to be IV amiodarone - he is tolerating the rate and rhythm well - no PO medications   - long-term plan: transition to 200 mg BID x 7 days, then 200 mg daily prior to discharge - restart home lopressor when taking POs   2. Anticoagulation - on heparin drip - will need coumadin prior to discharge - transition once appropriate per primary - Hb 9.6 - stable, no issues with bleeding   3. AKI - sCr today 1.31, K 4.3   4. S/p aortobifem complicated by ileus - primary ordered CT abdomen - NG in place     For questions or updates, please contact [facility] Please consult [email or web address] for contact info under     Lennette Biharihomas A. Dhanush Jokerst, MD, [facility] 02/07/2019 6:04 PM

## 2019-02-07 NOTE — Progress Notes (Signed)
Nutrition Consult/Follow Up  DOCUMENTATION CODES:   Non-severe (moderate) malnutrition in context of acute illness/injury  INTERVENTION:    TPN per pharmacy  Monitor magnesium, potassium, and phosphorus daily for at least 3 days, MD to replete as needed, as pt is at risk for refeeding syndrome  NUTRITION DIAGNOSIS:   Moderate Malnutrition related to acute illness (ileus) as evidenced by energy intake < or equal to 50% for > or equal to 5 days, mild muscle depletion, mild fat depletion, ongoing  GOAL:   Patient will meet greater than or equal to 90% of their needs, progressing  MONITOR:   Diet advancement, Supplement acceptance, I & O's, Skin, Weight trends, Labs  ASSESSMENT:   68 year old male who presented on 2/10 for aortobifemoral bypass with bilateral common femoral endarterectomy and profundoplasty, bilateral iliac thrombectomy. PMH significant for atrial fibrillation, tachybrady syndrome, prior CVA, CKD, and PVD.  2/12 clear liquids 2/13 transferred to ICU, NPO, NGT placed d/t post-op ileus  2/18 pharmacy consulted for TPN, not started 2/18 NGT removed, advanced to clear liquids  RD re-consulted for new TPN/TNA. Noted pt with vomiting 2/18 PM and 2/19 AM; now NPO. Pharmacy re-consulted yesterday for TPN initiation.  NGT reinserted yesterday per Dr. Darrick Penna; to [facility]. TPN to increase to 60 ml/hr via PICC line tonight. Provides 1519 kcals and 81 gm protein.  Labs reviewed. Mg, Phos, K+ WNL. Medications reviewed. CBG's [id number].  Diet Order:   Diet Order            Diet NPO time specified  Diet effective now             EDUCATION NEEDS:   No education needs have been identified at this time  Skin:  Skin Assessment: Skin Integrity Issues: Incisions: closed incisions to R groin, L groin, abdomen  Last BM:  2/17  Height:   Ht Readings from Last 1 Encounters:  01/28/19 6\' 5"  (1.956 m)   Weight:   Wt Readings from Last 1 Encounters:  02/06/19  89.2 kg   Ideal Body Weight:  94.5 kg  BMI:  Body mass index is 23.33 kg/m.  Estimated Nutritional Needs:   Kcal:  2200-2400  Protein:  110-125 gm  Fluid:  >/= 2.2 L  Maureen Chatters, RD, LDN Pager #: [phone] After-Hours Pager #: [phone]

## 2019-02-07 NOTE — Progress Notes (Signed)
Physical Therapy Treatment Patient Details Name: [name] [name] MRN: [id number] DOB: [birth year] Today's Date: 02/07/2019    History of Present Illness Pt s/p Aortobifemoral bypass on 2/10. Pt developed afib with rvr, acute renal failure and ileus 2/12-2/13. Pt had NG tube for ileus that was removed 2/17 and had to be replaced 2/19. 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     PT Comments    Pt feels bad and required verbal encouragement to mobilize. Progress continues to be slow due to multiple medical issues.    Follow Up Recommendations  SNF;Supervision/Assistance - 24 hour     Equipment Recommendations  Other (comment)(To be determined)    Recommendations for Other Services       Precautions / Restrictions Precautions Precautions: Fall;Other (comment)(expressive aphasia, watch HR) Restrictions Weight Bearing Restrictions: No    Mobility  Bed Mobility Overal bed mobility: Needs Assistance Bed Mobility: Supine to Sit     Supine to sit: Mod assist;+2 for safety/equipment     General bed mobility comments: Assist to move legs off of bed, elevate trunk into sitting and bring hips to EOB.  Transfers Overall transfer level: Needs assistance Equipment used: Rolling walker (2 wheeled) Transfers: Sit to/from [employer] Sit to Stand: Mod assist;+2 safety/equipment Stand pivot transfers: +2 physical assistance;Min assist       General transfer comment: Assist to bring hips up and for balance. Assist to place rt hand on walker. Verbal cues for hand placement. Bed to recliner with assist for balance and support.   Ambulation/Gait Ambulation/Gait assistance: Min assist;+2 safety/equipment Gait Distance (Feet): 2 Feet Assistive device: Rolling walker (2 wheeled) Gait Pattern/deviations: Decreased step length - right;Decreased step length - left;Shuffle;Step-to pattern Gait velocity: decr Gait velocity interpretation:  <1.31 ft/sec, indicative of household ambulator General Gait Details: Assist for balance and support. Pt amb from bed to recliner. Pt with NG tube limiting distance   Stairs             Wheelchair Mobility    Modified Rankin (Stroke Patients Only)       Balance Overall balance assessment: Needs assistance Sitting-balance support: No upper extremity supported;Feet supported Sitting balance-Leahy Scale: Fair     Standing balance support: Bilateral upper extremity supported Standing balance-Leahy Scale: Poor Standing balance comment: walker and min assist for static standing                            Cognition Arousal/Alertness: Awake/alert Behavior During Therapy: WFL for tasks assessed/performed Overall Cognitive Status: Difficult to assess                                 General Comments: Believe pt is at baseline. Pt follows commands.      Exercises      General Comments        Pertinent Vitals/Pain Pain Assessment: Faces Faces Pain Scale: Hurts little more Pain Location: abdomen Pain Descriptors / Indicators: Grimacing Pain Intervention(s): Limited activity within patient's tolerance;Monitored during session;Repositioned    Home Living                      Prior Function            PT Goals (current goals can now be found in the care plan section) Acute Rehab PT Goals PT Goal Formulation: With patient  Time For Goal Achievement: 02/21/19 Potential to Achieve Goals: Good Progress towards PT goals: Not progressing toward goals - comment;Goals downgraded-see care plan    Frequency    Min 2X/week      PT Plan Current plan remains appropriate;Frequency needs to be updated    Co-evaluation PT/OT/SLP Co-Evaluation/Treatment: Yes Reason for Co-Treatment: Complexity of the patient's impairments (multi-system involvement);For patient/therapist safety          AM-PAC PT "6 Clicks" Mobility   Outcome  Measure  Help needed turning from your back to your side while in a flat bed without using bedrails?: A Lot Help needed moving from lying on your back to sitting on the side of a flat bed without using bedrails?: A Lot Help needed moving to and from a bed to a chair (including a wheelchair)?: A Lot Help needed standing up from a chair using your arms (e.g., wheelchair or bedside chair)?: A Lot Help needed to walk in hospital room?: A Lot Help needed climbing 3-5 steps with a railing? : A Lot 6 Click Score: 12    End of Session Equipment Utilized During Treatment: Gait belt Activity Tolerance: Patient limited by fatigue Patient left: with call bell/phone within reach;in chair Nurse Communication: Mobility status PT Visit Diagnosis: Other abnormalities of gait and mobility (R26.89);Muscle weakness (generalized) (M62.81)     Time: 0962-8366 PT Time Calculation (min) (ACUTE ONLY): 34 min  Charges:  $Therapeutic Activity: 8-22 mins                     [facility] PT Acute Rehabilitation Services Pager [phone] Office [phone]    Angelina Ok [facility] 02/07/2019, 12:09 PM

## 2019-02-07 NOTE — Progress Notes (Signed)
ANTICOAGULATION CONSULT NOTE - Follow Up Consult  Pharmacy Consult for Heparin Indication: atrial fibrillation  No Known Allergies  Patient Measurements: Height: 6\' 5"  (195.6 cm) Weight: 196 lb 11.2 oz (89.2 kg) IBW/kg (Calculated) : 89.1 Heparin Dosing Weight: 84 kg  Vital Signs: Temp: 98.2 F (36.8 C) (02/20 0425) Temp Source: Oral (02/20 0425) BP: 98/61 (02/20 0425) Pulse Rate: 112 (02/20 0425)  Labs: Recent Labs    02/05/19 0527 02/06/19 0431 02/06/19 0544 02/06/19 1104 02/07/19 0239  HGB 9.6*  --   --   --  9.4*  HCT 31.2*  --   --   --  29.7*  PLT 244  --   --   --  306  LABPROT 18.0* 17.7*  --   --  16.8*  INR 1.51 1.47  --   --  1.38  HEPARINUNFRC  --  1.16* 0.44  --  0.37  CREATININE 1.27* 1.24  --  1.31* 1.40*    Estimated Creatinine Clearance: 62.8 mL/min (A) (by C-G formula based on SCr of 1.4 mg/dL (H)).  Assessment:  69 YOM s/p aortabifem bypass and has been on heparin infusion. Heparin was discontinued 2/13 due to drop in hemoglobin (13.3 on 2/11 to 10.5 on 2/13). Last dose of Coumadin was 2/12 and on hold due to ileus. Pharmacy consulted to resume heparin with no bolus 2/14.    Heparin level remains therapeutic (0.37) on 1700 units/hr.  INR 1.38   Coumadin on hold until tolerating liquids.  On TPN.  Goal of Therapy:  Heparin level 0.3-0.5 units/ml, lower end of therapeutic range Monitor platelets by anticoagulation protocol: Yes   Plan:   Continue heparin drip at 1700 units/hr  Daily heparin level and CBC.  Coumadin on hold; follow up for resuming when tolerating liquids.  Dennie Fetters, RPh Pager: [phone] or phone: [phone] 02/07/2019,10:31 AM

## 2019-02-08 ENCOUNTER — Inpatient Hospital Stay (HOSPITAL_COMMUNITY): Payer: Medicare HMO

## 2019-02-08 LAB — HEPARIN LEVEL (UNFRACTIONATED): HEPARIN UNFRACTIONATED: 0.35 [IU]/mL (ref 0.30–0.70)

## 2019-02-08 LAB — GLUCOSE, CAPILLARY
Glucose-Capillary: 119 mg/dL — ABNORMAL HIGH (ref 70–99)
Glucose-Capillary: 127 mg/dL — ABNORMAL HIGH (ref 70–99)
Glucose-Capillary: 94 mg/dL (ref 70–99)
Glucose-Capillary: 99 mg/dL (ref 70–99)

## 2019-02-08 LAB — COMPREHENSIVE METABOLIC PANEL
ALT: 26 U/L (ref 0–44)
AST: 28 U/L (ref 15–41)
Albumin: 1.7 g/dL — ABNORMAL LOW (ref 3.5–5.0)
Alkaline Phosphatase: 64 U/L (ref 38–126)
Anion gap: 5 (ref 5–15)
BUN: 14 mg/dL (ref 8–23)
CO2: 25 mmol/L (ref 22–32)
Calcium: 7.4 mg/dL — ABNORMAL LOW (ref 8.9–10.3)
Chloride: 104 mmol/L (ref 98–111)
Creatinine, Ser: 1.29 mg/dL — ABNORMAL HIGH (ref 0.61–1.24)
GFR calc non Af Amer: 56 mL/min — ABNORMAL LOW (ref >60)
Glucose, Bld: 145 mg/dL — ABNORMAL HIGH (ref 70–99)
Potassium: 3.7 mmol/L (ref 3.5–5.1)
Sodium: 134 mmol/L — ABNORMAL LOW (ref 135–145)
Total Bilirubin: 1.6 mg/dL — ABNORMAL HIGH (ref 0.3–1.2)
Total Protein: 4.7 g/dL — ABNORMAL LOW (ref 6.5–8.1)

## 2019-02-08 LAB — PROTIME-INR
INR: 1.22
PROTHROMBIN TIME: 15.3 s — AB (ref 11.4–15.2)

## 2019-02-08 LAB — CBC
HCT: 27.9 % — ABNORMAL LOW (ref 39.0–52.0)
Hemoglobin: 8.6 g/dL — ABNORMAL LOW (ref 13.0–17.0)
MCH: 29.1 pg (ref 26.0–34.0)
MCHC: 30.8 g/dL (ref 30.0–36.0)
MCV: 94.3 fL (ref 80.0–100.0)
PLATELETS: 290 10*3/uL (ref 150–400)
RBC: 2.96 MIL/uL — ABNORMAL LOW (ref 4.22–5.81)
RDW: 15.2 % (ref 11.5–15.5)
WBC: 15.2 10*3/uL — ABNORMAL HIGH (ref 4.0–10.5)
nRBC: 0 % (ref 0.0–0.2)

## 2019-02-08 LAB — PHOSPHORUS: Phosphorus: 3.1 mg/dL (ref 2.5–4.6)

## 2019-02-08 LAB — MAGNESIUM: Magnesium: 1.9 mg/dL (ref 1.7–2.4)

## 2019-02-08 MED ORDER — TRAVASOL 10 % IV SOLN
INTRAVENOUS | Status: AC
  Administered 2019-02-08: 18:00:00 via INTRAVENOUS
  Filled 2019-02-08: qty 1209.6

## 2019-02-08 MED ORDER — DEXTROSE-NACL 5-0.45 % IV SOLN
INTRAVENOUS | Status: DC
  Administered 2019-02-08 – 2019-02-09 (×3): via INTRAVENOUS

## 2019-02-08 MED ORDER — IOHEXOL 300 MG/ML  SOLN
100.0000 mL | Freq: Once | INTRAMUSCULAR | Status: AC | PRN
  Administered 2019-02-08: 100 mL via INTRAVENOUS

## 2019-02-08 MED ORDER — POTASSIUM CHLORIDE 10 MEQ/100ML IV SOLN
INTRAVENOUS | Status: AC
  Filled 2019-02-08: qty 100

## 2019-02-08 MED ORDER — POTASSIUM CHLORIDE 10 MEQ/100ML IV SOLN
10.0000 meq | INTRAVENOUS | Status: AC
  Administered 2019-02-08 (×4): 10 meq via INTRAVENOUS
  Filled 2019-02-08 (×3): qty 100

## 2019-02-08 MED ORDER — MAGNESIUM SULFATE 2 GM/50ML IV SOLN
2.0000 g | Freq: Once | INTRAVENOUS | Status: AC
  Administered 2019-02-08: 2 g via INTRAVENOUS
  Filled 2019-02-08: qty 50

## 2019-02-08 NOTE — Progress Notes (Signed)
CT abdomen images reviewed.  Some retroperitoneal hematoma on the left side is probably causing the ileus and also some left hydronephrosis.  He still has dilated small bowel.  There is no obvious obstruction.  He has stool in the colon.  The hematoma has probably been present since initial operation days ago.  As long as his hemoglobin is stable will continue heparin.  Will need to leave NG tube for several days since he has failed 2 prior times.  Continue TPN continue NG tube continue foley catheter.  [name], MD [facility] Office: [phone] Pager: [phone]

## 2019-02-08 NOTE — Progress Notes (Signed)
Pt was able to tolerate intake of all of oral contrast via NG tube. Pt remains of intermittent suction due to this. Will restart suction upon return from CT. Temporarily held IVF while pt is getting CT for administration of contrast.   Leonidas Romberg, RN

## 2019-02-08 NOTE — Progress Notes (Signed)
Received call from [facility] Radiology with result of CT abdomen. Spoke with Dr. Darrick Penna in the [facility] lab who states he will review. No new orders at this time.   Leonidas Romberg, RN

## 2019-02-08 NOTE — Progress Notes (Addendum)
Progress Note  Patient Name: [name] [name] Date of Encounter: 02/08/2019  Primary Cardiologist: Rollene Rotunda, MD   Subjective   No chest pain and no SOB--a fib mostly rate controlled, still with NG.   Inpatient Medications    Scheduled Meds: . Chlorhexidine Gluconate Cloth  6 each Topical Daily  . insulin aspart  0-9 Units Subcutaneous Q6H  . mouth rinse  15 mL Mouth Rinse BID  . pantoprazole (PROTONIX) IV  40 mg Intravenous Q24H  . sodium chloride flush  10-40 mL Intracatheter Q12H   Continuous Infusions: . amiodarone 30 mg/hr (02/08/19 0812)  . dextrose 5 % and 0.45% NaCl 90 mL/hr at 02/08/19 0519  . heparin 1,700 Units/hr (02/07/19 1032)  . TPN ADULT (ION) 60 mL/hr at 02/07/19 1757   PRN Meds: acetaminophen **OR** acetaminophen, bisacodyl, guaiFENesin-dextromethorphan, hydrALAZINE, HYDROmorphone (DILAUDID) injection, labetalol, lip balm, ondansetron, phenol, sodium chloride flush   Vital Signs    Vitals:   02/07/19 0425 02/07/19 1146 02/07/19 2011 02/08/19 0513  BP: 98/61 119/75 112/80 119/84  Pulse: (!) 112 (!) 105 94 92  Resp: 18 (!) 24 (!) 23 19  Temp: 98.2 F (36.8 C) 97.7 F (36.5 C) 98.3 F (36.8 C) 97.8 F (36.6 C)  TempSrc: Oral Oral Oral Oral  SpO2: 98% 100% 97% 98%  Weight:      Height:        Intake/Output Summary (Last 24 hours) at 02/08/2019 0919 Last data filed at 02/08/2019 0515 Gross per 24 hour  Intake 5554.57 ml  Output 3150 ml  Net 2404.57 ml   Last 3 Weights 02/06/2019 02/05/2019 02/04/2019  Weight (lbs) 196 lb 11.2 oz 186 lb 4.6 oz 188 lb 7.9 oz  Weight (kg) 89.223 kg 84.5 kg 85.5 kg      Telemetry    A fib at times with RVR - Personally Reviewed  ECG    No new - Personally Reviewed  Physical Exam   GEN: No acute distress.   Neck: No JVD Cardiac: irreg irreg, no murmurs, rubs, or gallops.  Respiratory: Clear to auscultation bilaterally. GI: Soft, nontender, non-distended  MS: 1-2+ lower ext edema; No  deformity. Neuro:  Nonfocal  Psych: Normal affect   Labs    Chemistry Recent Labs  Lab 02/05/19 0527  02/06/19 1104 02/07/19 0239 02/08/19 0400  NA 141   < > 139 138 134*  K 3.7   < > 4.3 3.7 3.7  CL 108   < > 107 105 104  CO2 23   < > 24 25 25   GLUCOSE 145*   < > 111* 131* 145*  BUN 18   < > 14 15 14   CREATININE 1.27*   < > 1.31* 1.40* 1.29*  CALCIUM 7.9*   < > 7.8* 7.8* 7.4*  PROT 5.3*  --   --  5.1* 4.7*  ALBUMIN 2.1*  --   --  1.8* 1.7*  AST 20  --   --  29 28  ALT 18  --   --  27 26  ALKPHOS 59  --   --  65 64  BILITOT 1.9*  --   --  1.7* 1.6*  GFRNONAA 57*   < > 55* 51* 56*  GFRAA >60   < > >60 59* >60  ANIONGAP 10   < > 8 8 5    < > = values in this interval not displayed.     Hematology Recent Labs  Lab 02/05/19 0527 02/07/19 0239 02/08/19 0400  WBC 15.3* 15.9* 15.2*  RBC 3.26* 3.16* 2.96*  HGB 9.6* 9.4* 8.6*  HCT 31.2* 29.7* 27.9*  MCV 95.7 94.0 94.3  MCH 29.4 29.7 29.1  MCHC 30.8 31.6 30.8  RDW 15.1 15.2 15.2  PLT 244 306 290    Cardiac EnzymesNo results for input(s): TROPONINI in the last 168 hours. No results for input(s): TROPIPOC in the last 168 hours.   BNPNo results for input(s): BNP, PROBNP in the last 168 hours.   DDimer No results for input(s): DDIMER in the last 168 hours.   Radiology    Dg Abd 2 Views  Result Date: 02/06/2019 CLINICAL DATA:  Nausea, vomiting, recent abdominal surgery, ileus EXAM: ABDOMEN - 2 VIEW COMPARISON:  01/31/2019 FINDINGS: Numerous air-filled dilated loops of small bowel throughout abdomen consistent with postoperative ileus. Some of the loops are slightly less distended than on the previous exam. Stool is noted throughout the proximal half of the colon. Minimal stool and gas in rectum. Bones demineralized. IMPRESSION: Persistent gaseous distension of small bowel loops favoring postoperative ileus, minimally improved versus previous exam. Electronically Signed   By: Ulyses SouthwardMark  Boles M.D.   On: 02/06/2019 11:36   Dg  Abd Portable 1v  Result Date: 02/07/2019 CLINICAL DATA:  Postop ileus. EXAM: PORTABLE ABDOMEN - 1 VIEW COMPARISON:  02/06/2019. FINDINGS: Distended loops of small and large bowel are redemonstrated, not significantly improved from yesterday's exam. Enteric tube is not visualized. Midline abdominal staples. IMPRESSION: Distended loops of small and large bowel are redemonstrated, not significantly improved from yesterday's exam. Enteric tube not visualized, unclear if this is simply excluded from the film or has been removed. Correlate clinically. Electronically Signed   By: Elsie StainJohn T Curnes M.D.   On: 02/07/2019 09:04   Dg Abd Portable 1v  Result Date: 02/06/2019 CLINICAL DATA:  Reason for exam: Nasogastric tube placement. Hx of chronic kidney disease. Per pt no abd pain today. Unable to communicate hx and pain well. EXAM: PORTABLE ABDOMEN - 1 VIEW COMPARISON:  02/06/2019 FINDINGS: Nasogastric tube tip overlies the level of the stomach. There is persistent significant dilatation of small bowel loops, out of proportion compared large bowel loops. The dilated small bowel loops appear to have increased wall thickness compared with earlier studies. Gas and stool within nondilated loops of colon. IMPRESSION: 1. Nasogastric tube tip overlies the level of the stomach. 2. Increased wall thickness of dilated small bowel loops. 3. Consider further evaluation with CT of the abdomen and pelvis. Electronically Signed   By: Norva PavlovElizabeth  Brown M.D.   On: 02/06/2019 12:28    Cardiac Studies   Echo 02/01/19: 1. The left ventricle has normal systolic function with an ejection fraction of 60-65%. The cavity size was normal. There is moderately increased left ventricular wall thickness. Left ventricular diastology could not be evaluated secondary to atrial  fibrillation. 2. The right ventricle has normal systolic function. The cavity was normal. There is no increase in right ventricular wall thickness. 3. The mitral valve is  normal in structure. 4. The tricuspid valve is normal in structure. 5. Probably trileaflet Mild thickening of the aortic valve Moderate calcification of the aortic valve. no stenosis of the aortic valve. 6. The pulmonic valve was normal in structure. 7. 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 8. 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 9. Right atrial pressure is estimated at 3 mmHg.  Patient Profile     68 y.o. male with a hx of  atrial fibrillation and tachy-brady syndrome who has refused PPM in the past, chronic anticoagulation therapy w/ Coumadin, h/o CVA, CKD and PVD admitted for elective Bi-Femoral bypass, who is being followed for the evaluation ofatrial fibrillation w/ RVR.  Assessment & Plan    . Atrial fibrillation with RVR - - converted to NSR on 02/04/19 but now back in a fib.  -  Atrial fibrillation today with improvedbut  rate currently 105-110.  He continues to be IV amiodarone - he is tolerating the rate and rhythm well - no PO medications - long-term plan: transition to 200 mg BID x 7 days, then 200 mg daily prior to discharge - restart home lopressor when taking POs   2. Anticoagulation - on heparin drip - will need coumadin prior to discharge - transition once appropriate per primary - Hb 9.6 - stable, no issues with bleeding --INR 1.22   3. AKI - sCr today 1.29 and Na 134 receiving IV fluids to maintain euvolmeia with NG output  I&O 23,937/14,315    4. S/p aortobifem complicated by ileus - primary ordered CT abdomen - NG in place  5.  Anemia with hgb 8.6 per surgical team  6.  Malnutrition on TPN per surgical team      For questions or updates, please contact [facility] Please consult [email or web address] for contact info under   Signed, Nada Boozer, NP  02/08/2019, 9:19 AM     Patient seen and examined. Agree with assessment and plan.  Niccoli is feeling slightly improved.  NG continues  to be in place.  Atrial fibrillation rate is now better controlled in the upper 90s to low 100s.  Continues to be on IV amiodarone.  Transition to oral therapy once able to take oral medications.   Lennette Bihari, MD, [facility] 02/08/2019 11:44 AM

## 2019-02-08 NOTE — Progress Notes (Signed)
PHARMACY - ADULT TOTAL PARENTERAL NUTRITION CONSULT NOTE   Pharmacy Consult:  TPN Indication:  Prolonged ileus  Patient Measurements: Height: 6\' 5"  (195.6 cm) Weight: 196 lb 11.2 oz (89.2 kg) IBW/kg (Calculated) : 89.1 TPN AdjBW (KG): 84.4 Body mass index is 23.33 kg/m.  Assessment:  51 YOM with history of LE weakness and CVA from AFib.  He presented on 01/28/19 for aorta bifemoral bypass graft, femoral endarterectomy and iliac thrombectomy.  Patient was started on a clear liquid diet post-op but intake was inadequate.  NG tube placed on 01/31/19 and patient was made NPO again.  He was started on clears on 02/05/19 and vomitted.  Pharmacy consulted to manage TPN for nutrition support.    Patient has aphasia and reports he eats primarily soup and was eating regularly up until the day of admission.  He has minimal to no intake for ~10 days and is at risk for refeeding.  GI: Persistent ileus still with high NG output (950 mls).  Prealbumin low at 6,  LBM 2/17. PPI IV, PRN Zofran Endo: no hx DM - CBGs controlled Insulin requirements in the past 24 hours: 1 unit (2 unitS since TPN started) Lytes: mild refeeding - K 3.7 (goal >/= 4 for ileus/AFib), Mag 1.9 (goal >/= 2 for ileus/Afib), others WNL Renal: SCr 1.29, BUN WNL - UOP 1.4 ml/kg/hr, D51/2NS at 95/hr Pulm: stable on RA Cards: BP ok, HR variable (in Afib) - ASA, amiodarone gtt AC: Heparin for Afib - hgb 9s, plts normalized Hepatobil: LFTs / TG WNL, tbili down to 1.7 (no jaundice) Neuro: PVD/CVA - ASA, PRN Dilaudid ID: [email or web address], WBC up to 15.2, UCx negative - not on abx TPN Access: PICC placed 02/03/19 TPN start date: 02/06/19  Nutritional Goals (RD rec 2/17): 2200-2400 kCal, 110-125g protein, >/= 2.2L fluid per day  Current Nutrition:  TPN  Plan:  Increase TPN to goal rate 90 ml/hr TPN will provide 121g AA, 324g CHO and 69g ILE for a total of 2206 kCal, meeting approx 100% of patient's needs. Electrolytes in TPN: Cl:Ac 1:1 -  increase all lytes with increased TPN rate Daily multivitamin and trace elements in TPN Continue sensitive SSI Q6H.  D/C if CBGs remain controlled at goal TPN rate. Change IVF to 60 ml/hr when new TPN bag starts (total fluid = 150 ml/hr per Vascular ) KCL x 4 runs Mag sulfate 2gm IV x 1 F/U AM labs  Jeanella Cara, PharmD, [facility] Clinical Pharmacist Please see AMION for all Pharmacists' Contact Phone Numbers 02/08/2019, 10:09 AM

## 2019-02-08 NOTE — Progress Notes (Signed)
[facility] [place]  Subjective  - feels ok   Objective 119/84 92 97.8 F (36.6 C) (Oral) 19 98%  Intake/Output Summary (Last 24 hours) at 02/08/2019 0810 Last data filed at 02/08/2019 0515 Gross per 24 hour  Intake 5554.57 ml  Output 3950 ml  Net 1604.57 ml   NG 1200 clear brown fluid No flatus or BM Abdomen groin incisions healing Feet pink warm edematous  Assessment/Planning: Persistent ileus still with high NG output Will get CT abdomen pelvis today with oral and IV contrast.  Hopefully he will tolerate the oral contrast.  Aortobifem patent Pedal edema most likely secondary to severe protein calorie malnutrition continue TPN  Renal failure resolved still needs IV fluid to maintain euvolemia with high NG output  Afib on heparin amiodarone cardiology following  Leukocytosis probably reactive afebrile  Acute blood loss anemia hemoglobin hanging around 9 follow   [name] 02/08/2019 8:10 AM --  Laboratory Lab Results: Recent Labs    02/07/19 0239 02/08/19 0400  WBC 15.9* 15.2*  HGB 9.4* 8.6*  HCT 29.7* 27.9*  PLT 306 290   BMET Recent Labs    02/07/19 0239 02/08/19 0400  NA 138 134*  K 3.7 3.7  CL 105 104  CO2 25 25  GLUCOSE 131* 145*  BUN 15 14  CREATININE 1.40* 1.29*  CALCIUM 7.8* 7.4*    COAG Lab Results  Component Value Date   INR 1.22 02/08/2019   INR 1.38 02/07/2019   INR 1.47 02/06/2019   No results found for: PTT

## 2019-02-08 NOTE — Progress Notes (Signed)
ANTICOAGULATION CONSULT NOTE - Follow Up Consult  Pharmacy Consult for Heparin Indication: atrial fibrillation  No Known Allergies  Patient Measurements: Height: 6\' 5"  (195.6 cm) Weight: 196 lb 11.2 oz (89.2 kg) IBW/kg (Calculated) : 89.1 Heparin Dosing Weight: 84 kg  Vital Signs: Temp: 97.8 F (36.6 C) (02/21 0513) Temp Source: Oral (02/21 0513) BP: 119/84 (02/21 0513) Pulse Rate: 92 (02/21 0513)  Labs: Recent Labs    02/06/19 0431 02/06/19 0544 02/06/19 1104 02/07/19 0239 02/08/19 0400  HGB  --   --   --  9.4* 8.6*  HCT  --   --   --  29.7* 27.9*  PLT  --   --   --  306 290  LABPROT 17.7*  --   --  16.8* 15.3*  INR 1.47  --   --  1.38 1.22  HEPARINUNFRC 1.16* 0.44  --  0.37 0.35  CREATININE 1.24  --  1.31* 1.40* 1.29*    Estimated Creatinine Clearance: 68.1 mL/min (A) (by C-G formula based on SCr of 1.29 mg/dL (H)).  Assessment:  89 YOM s/p aortabifem bypass and has been on heparin infusion. Heparin was discontinued 2/13 due to drop in hemoglobin (13.3 on 2/11 to 10.5 on 2/13). Last dose of Coumadin was 2/12 and on hold due to ileus. Pharmacy consulted to resume heparin with no bolus 2/14.    Heparin level remains therapeutic (0.57) on 1700 units/hr.  INR down 1.22. Coumadin on hold until tolerating liquids.  On TPN.  Hgb trended down today, no bleeding reported.  Goal of Therapy:  Heparin level 0.3-0.5 units/ml, lower end of therapeutic range Monitor platelets by anticoagulation protocol: Yes   Plan:   Continue heparin drip at 1700 units/hr  Daily heparin level and CBC.  Coumadin on hold; follow up for resuming when tolerating liquids.  Dennie Fetters, [place] Pager: [phone] or phone: [phone] 02/08/2019,10:04 AM

## 2019-02-09 LAB — COMPREHENSIVE METABOLIC PANEL
ALT: 28 U/L (ref 0–44)
AST: 29 U/L (ref 15–41)
Albumin: 1.8 g/dL — ABNORMAL LOW (ref 3.5–5.0)
Alkaline Phosphatase: 70 U/L (ref 38–126)
Anion gap: 6 (ref 5–15)
BUN: 16 mg/dL (ref 8–23)
CO2: 24 mmol/L (ref 22–32)
Calcium: 7.3 mg/dL — ABNORMAL LOW (ref 8.9–10.3)
Chloride: 101 mmol/L (ref 98–111)
Creatinine, Ser: 1.3 mg/dL — ABNORMAL HIGH (ref 0.61–1.24)
GFR calc Af Amer: >60 mL/min (ref >60)
GFR calc non Af Amer: 56 mL/min — ABNORMAL LOW (ref >60)
Glucose, Bld: 112 mg/dL — ABNORMAL HIGH (ref 70–99)
Potassium: 4.4 mmol/L (ref 3.5–5.1)
Sodium: 131 mmol/L — ABNORMAL LOW (ref 135–145)
Total Bilirubin: 1.8 mg/dL — ABNORMAL HIGH (ref 0.3–1.2)
Total Protein: 4.8 g/dL — ABNORMAL LOW (ref 6.5–8.1)

## 2019-02-09 LAB — CBC
HCT: 26.5 % — ABNORMAL LOW (ref 39.0–52.0)
Hemoglobin: 8.3 g/dL — ABNORMAL LOW (ref 13.0–17.0)
MCH: 29.5 pg (ref 26.0–34.0)
MCHC: 31.3 g/dL (ref 30.0–36.0)
MCV: 94.3 fL (ref 80.0–100.0)
PLATELETS: 314 10*3/uL (ref 150–400)
RBC: 2.81 MIL/uL — AB (ref 4.22–5.81)
RDW: 15.4 % (ref 11.5–15.5)
WBC: 22.4 10*3/uL — ABNORMAL HIGH (ref 4.0–10.5)
nRBC: 0.1 % (ref 0.0–0.2)

## 2019-02-09 LAB — GLUCOSE, CAPILLARY
Glucose-Capillary: 104 mg/dL — ABNORMAL HIGH (ref 70–99)
Glucose-Capillary: 117 mg/dL — ABNORMAL HIGH (ref 70–99)
Glucose-Capillary: 119 mg/dL — ABNORMAL HIGH (ref 70–99)
Glucose-Capillary: 122 mg/dL — ABNORMAL HIGH (ref 70–99)
Glucose-Capillary: 123 mg/dL — ABNORMAL HIGH (ref 70–99)

## 2019-02-09 LAB — PHOSPHORUS: Phosphorus: 3.1 mg/dL (ref 2.5–4.6)

## 2019-02-09 LAB — HEPARIN LEVEL (UNFRACTIONATED)
Heparin Unfractionated: 0.26 IU/mL — ABNORMAL LOW (ref 0.30–0.70)
Heparin Unfractionated: 0.4 IU/mL (ref 0.30–0.70)

## 2019-02-09 LAB — MAGNESIUM: Magnesium: 1.9 mg/dL (ref 1.7–2.4)

## 2019-02-09 LAB — PROTIME-INR
INR: 1.21
Prothrombin Time: 15.2 seconds (ref 11.4–15.2)

## 2019-02-09 MED ORDER — TRAVASOL 10 % IV SOLN
INTRAVENOUS | Status: AC
  Administered 2019-02-09: 18:00:00 via INTRAVENOUS
  Filled 2019-02-09: qty 1209.6

## 2019-02-09 MED ORDER — MAGNESIUM SULFATE IN D5W 1-5 GM/100ML-% IV SOLN
1.0000 g | Freq: Once | INTRAVENOUS | Status: AC
  Administered 2019-02-09: 1 g via INTRAVENOUS
  Filled 2019-02-09: qty 100

## 2019-02-09 MED ORDER — SODIUM CHLORIDE 0.9 % IV SOLN
INTRAVENOUS | Status: DC
  Administered 2019-02-09 – 2019-02-13 (×8): via INTRAVENOUS

## 2019-02-09 NOTE — Progress Notes (Signed)
   VASCULAR SURGERY ASSESSMENT & PLAN:   12 Days Post-Op s/p: Aortobifemoral bypass complicated by postoperative ileus secondary to retroperitoneal hematoma  NUTRITION: He is on TNA.  RENAL: His creatinine is 1.3 which is stable.  He has good urine output.  GI: NG tube 600 cc last shift.  The plan is to keep this in over the weekend regardless of the drainage.  Mobilize   SUBJECTIVE:   No specific complaints this morning although he looks tired.  PHYSICAL EXAM:   Vitals:   02/08/19 1200 02/08/19 1600 02/08/19 1959 02/09/19 0403  BP: 124/85  134/68 110/68  Pulse: 91 64 70 69  Resp: 20 (!) 22 (!) 24 19  Temp: 98.1 F (36.7 C) 97.6 F (36.4 C) (!) 97.3 F (36.3 C) 98.1 F (36.7 C)  TempSrc: Oral  Oral Oral  SpO2: 93% 99% 93% 93%  Weight:      Height:       His incisions look fine. Both feet are warm and well-perfused. ABDOMEN: He has normal pitched bowel sounds.  His abdomen is slightly distended.  LABS:   Lab Results  Component Value Date   WBC 22.4 (H) 02/09/2019   HGB 8.3 (L) 02/09/2019   HCT 26.5 (L) 02/09/2019   MCV 94.3 02/09/2019   PLT 314 02/09/2019   Lab Results  Component Value Date   CREATININE 1.30 (H) 02/09/2019   Lab Results  Component Value Date   INR 1.21 02/09/2019   CBG (last 3)  Recent Labs    02/08/19 1714 02/09/19 0021 02/09/19 0627  GLUCAP 94 123* 122*    PROBLEM LIST:    Active Problems:   AKI (acute kidney injury) (HCC)   Chronic distal aortic occlusion (HCC)   Paroxysmal atrial fibrillation with RVR (HCC)   Hypotension due to hypovolemia   Malnutrition of moderate degree   CURRENT MEDS:   . Chlorhexidine Gluconate Cloth  6 each Topical Daily  . insulin aspart  0-9 Units Subcutaneous Q6H  . mouth rinse  15 mL Mouth Rinse BID  . pantoprazole (PROTONIX) IV  40 mg Intravenous Q24H  . sodium chloride flush  10-40 mL Intracatheter Q12H    Waverly Ferrari Beeper: [phone] Office: [phone] 02/09/2019

## 2019-02-09 NOTE — Progress Notes (Signed)
PHARMACY - ADULT TOTAL PARENTERAL NUTRITION CONSULT NOTE   Pharmacy Consult:  TPN Indication:  Prolonged ileus  Patient Measurements: Height: 6\' 5"  (195.6 cm) Weight: 196 lb 11.2 oz (89.2 kg) IBW/kg (Calculated) : 89.1 TPN AdjBW (KG): 84.4 Body mass index is 23.33 kg/m.  Assessment:  24 YOM with history of LE weakness and CVA from AFib.  He presented on 01/28/19 for aorta bifemoral bypass graft, femoral endarterectomy and iliac thrombectomy.  Patient was started on a clear liquid diet post-op but intake was inadequate.  NG tube placed on 01/31/19 and patient was made NPO again.  He was started on clears on 02/05/19 and vomitted.  Pharmacy consulted to manage TPN for nutrition support.    Patient has aphasia and reports he eats primarily soup and was eating regularly up until the day of admission.  He has minimal to no intake for ~10 days and is at risk for refeeding.  GI: persistent ileus, NG O/P .  Prealbumin low at 6,  LBM 2/17. PPI IV, PRN Zofran Endo: no hx DM - CBGs controlled Insulin requirements in the past 24 hours: 1 unit Lytes: low Na, Mag 1.9 (goal >/= 2 for ileus/Afib), others WNL Renal: SCr 1.3, BUN WNL - UOP 1.4 ml/kg/hr, D51/2NS at 60/hr 2/21 CT showed left hydronephrosis Pulm: stable on RA Cards: VSS (in Afib) - ASA, amiodarone gtt AC: Heparin for Afib - hgb 9s, plts normalized.  1/21 CT showed RP hematoma Hepatobil: LFTs / TG WNL, tbili 1.8 (no jaundice) Neuro: PVD/CVA - ASA, PRN Dilaudid ID: [email or web address], WBC up to 22.4, UCx negative - not on abx TPN Access: PICC placed 02/03/19 TPN start date: 02/06/19  Nutritional Goals (RD rec 2/17): 2200-2400 kCal, 110-125g protein, >/= 2.2L fluid per day  Current Nutrition:  TPN  Plan:  Continue TPN at goal rate 90 ml/hr TPN provides 121g AA, 324g CHO and 69g ILE for a total of 2206 kCal, meeting 100% of patient's needs. Electrolytes in TPN: increase Na/Mag, Cl:Ac 1:1 Daily multivitamin and trace elements in TPN Continue  sensitive SSI Q6H.  D/C if SSI/CBG checks in AM if remain controlled. Change IVF to NS, continue at 60 ml/hr (total fluid = 150 ml/hr per Vascular ) Mag sulfate 1gm IV x 1 F/U AM labs  Jaston Havens D. Laney Potash, PharmD, BCPS, BCCCP 02/09/2019, 10:20 AM

## 2019-02-09 NOTE — Progress Notes (Signed)
ANTICOAGULATION CONSULT NOTE - Follow Up Consult  Pharmacy Consult for Heparin Indication: atrial fibrillation  No Known Allergies  Patient Measurements: Height: 6\' 5"  (195.6 cm) Weight: 196 lb 11.2 oz (89.2 kg) IBW/kg (Calculated) : 89.1 Heparin Dosing Weight: 84 kg  Vital Signs: Temp: 98.2 F (36.8 C) (02/22 0840) Temp Source: Oral (02/22 0840) BP: 124/68 (02/22 0840) Pulse Rate: 66 (02/22 0840)  Labs: Recent Labs    02/07/19 0239 02/08/19 0400 02/09/19 0400  HGB 9.4* 8.6* 8.3*  HCT 29.7* 27.9* 26.5*  PLT 306 290 314  LABPROT 16.8* 15.3* 15.2  INR 1.38 1.22 1.21  HEPARINUNFRC 0.37 0.35 0.26*  CREATININE 1.40* 1.29* 1.30*    Estimated Creatinine Clearance: 67.6 mL/min (A) (by C-G formula based on SCr of 1.3 mg/dL (H)).  Assessment:  13 YOM s/p aortabifem bypass and has been on heparin infusion. Heparin was discontinued 2/13 due to drop in hemoglobin (13.3 on 2/11 to 10.5 on 2/13). Last dose of warfarin was 2/12 and on hold due to ileus. Pharmacy consulted to resume heparin with no bolus 2/14.    Heparin level is slightly SUBtherapeutic (0.26) this morning on 1700 units/hr.  INR down 1.21. warfarin on hold until tolerating liquids.  On TPN.  Hgb trended down today, no bleeding reported.  Goal of Therapy:  Heparin level 0.3-0.5 units/ml, lower end of therapeutic range Monitor platelets by anticoagulation protocol: Yes   Plan:  Increase heparin drip to 1800 units/hr Check anti-Xa level in 8 hours and daily while on heparin Continue to monitor H&H and platelets Warfarin on hold; follow up for resuming when tolerating liquids.   Thank you for allowing [place] to participate in this patients care.   Signe Colt, PharmD Please utilize Amion (under [facility]) for appropriate number for your unit pharmacist. 02/09/2019 10:04 AM

## 2019-02-09 NOTE — Progress Notes (Addendum)
[facility] [place]  Subjective  - Feels weak, no significant changes.   Objective 110/68 69 98.1 F (36.7 C) (Oral) 19 93%  Intake/Output Summary (Last 24 hours) at 02/09/2019 0829 Last data filed at 02/09/2019 0404 Gross per 24 hour  Intake 1954.11 ml  Output 3640 ml  Net -1685.89 ml   Abdomin and B groins soft, incisions healing well Brisk PT doppler signals B, moderate edema B LE TNA NPO with NG tube in place new out put not documented.  Positive BS.   Assessment/Planning: POD # 13  Post-Op s/p: Aortobifemoral bypass complicated by postoperative ileus secondary to retroperitoneal hematoma HGB 8.3 post op anemia s/p 2 units PRBG transfused  Cr 1.3, UO > 5000 last 24 hours. Maintain NPO and NG tube D/C foley will place condom cath.   Mosetta Pigeon 02/09/2019 8:29 AM --  I have interviewed the patient and examined the patient. I agree with the findings by the PA. NG tube 450 cc / 150 cc last 2 shifts.  Abdomen still somewhat distended with hypoactive bowel sounds. His creatinine is improved. His hemoglobin is stable.  (8.2 today)  Cari Caraway, MD [phone]   Laboratory Lab Results: Recent Labs    02/08/19 0400 02/09/19 0400  WBC 15.2* 22.4*  HGB 8.6* 8.3*  HCT 27.9* 26.5*  PLT 290 314   BMET Recent Labs    02/08/19 0400 02/09/19 0400  NA 134* 131*  K 3.7 4.4  CL 104 101  CO2 25 24  GLUCOSE 145* 112*  BUN 14 16  CREATININE 1.29* 1.30*  CALCIUM 7.4* 7.3*    COAG Lab Results  Component Value Date   INR 1.21 02/09/2019   INR 1.22 02/08/2019   INR 1.38 02/07/2019   No results found for: PTT

## 2019-02-09 NOTE — Progress Notes (Signed)
Progress Note  Patient Name: [name] [name] Date of Encounter: 02/09/2019  Primary Cardiologist: Rollene Rotunda, MD   Subjective   No chest pain or sob. Back to NSR  Inpatient Medications    Scheduled Meds: . Chlorhexidine Gluconate Cloth  6 each Topical Daily  . insulin aspart  0-9 Units Subcutaneous Q6H  . mouth rinse  15 mL Mouth Rinse BID  . pantoprazole (PROTONIX) IV  40 mg Intravenous Q24H  . sodium chloride flush  10-40 mL Intracatheter Q12H   Continuous Infusions: . amiodarone 30 mg/hr (02/09/19 0749)  . dextrose 5 % and 0.45% NaCl 60 mL/hr at 02/09/19 0745  . heparin 1,700 Units/hr (02/09/19 0131)  . TPN ADULT (ION) 90 mL/hr at 02/08/19 1736   PRN Meds: acetaminophen **OR** acetaminophen, bisacodyl, guaiFENesin-dextromethorphan, hydrALAZINE, HYDROmorphone (DILAUDID) injection, labetalol, lip balm, ondansetron, phenol, sodium chloride flush   Vital Signs    Vitals:   02/08/19 1600 02/08/19 1959 02/09/19 0403 02/09/19 0840  BP:  134/68 110/68 124/68  Pulse: 64 70 69 66  Resp: (!) 22 (!) 24 19 (!) 23  Temp: 97.6 F (36.4 C) (!) 97.3 F (36.3 C) 98.1 F (36.7 C) 98.2 F (36.8 C)  TempSrc:  Oral Oral Oral  SpO2: 99% 93% 93% 95%  Weight:      Height:        Intake/Output Summary (Last 24 hours) at 02/09/2019 0931 Last data filed at 02/09/2019 0800 Gross per 24 hour  Intake 1954.11 ml  Output 4290 ml  Net -2335.89 ml   Filed Weights   02/04/19 1517 02/05/19 0400 02/06/19 1219  Weight: 85.5 kg 84.5 kg 89.2 kg    Telemetry    Atrial fib, now NSR - Personally Reviewed  ECG    none - Personally Reviewed  Physical Exam   GEN: No acute distress, NG tube in place.   Neck: No JVD Cardiac: RRR, no murmurs, rubs, or gallops.  Respiratory: Clear to auscultation bilaterally. GI: Soft, nontender, non-distended  MS: No edema; No deformity. Neuro:  Nonfocal  Psych: Normal affect   Labs    Chemistry Recent Labs  Lab 02/07/19 0239  02/08/19 0400 02/09/19 0400  NA 138 134* 131*  K 3.7 3.7 4.4  CL 105 104 101  CO2 25 25 24   GLUCOSE 131* 145* 112*  BUN 15 14 16   CREATININE 1.40* 1.29* 1.30*  CALCIUM 7.8* 7.4* 7.3*  PROT 5.1* 4.7* 4.8*  ALBUMIN 1.8* 1.7* 1.8*  AST 29 28 29   ALT 27 26 28   ALKPHOS 65 64 70  BILITOT 1.7* 1.6* 1.8*  GFRNONAA 51* 56* 56*  GFRAA 59* >60 >60  ANIONGAP 8 5 6      Hematology Recent Labs  Lab 02/07/19 0239 02/08/19 0400 02/09/19 0400  WBC 15.9* 15.2* 22.4*  RBC 3.16* 2.96* 2.81*  HGB 9.4* 8.6* 8.3*  HCT 29.7* 27.9* 26.5*  MCV 94.0 94.3 94.3  MCH 29.7 29.1 29.5  MCHC 31.6 30.8 31.3  RDW 15.2 15.2 15.4  PLT 306 290 314    Cardiac EnzymesNo results for input(s): TROPONINI in the last 168 hours. No results for input(s): TROPIPOC in the last 168 hours.   BNPNo results for input(s): BNP, PROBNP in the last 168 hours.   DDimer No results for input(s): DDIMER in the last 168 hours.   Radiology    Ct Abdomen Pelvis W Contrast  Result Date: 02/08/2019 CLINICAL DATA:  Persistent ileus status post aortobifemoral bypass graft. EXAM: CT ABDOMEN AND PELVIS WITH CONTRAST  TECHNIQUE: Multidetector CT imaging of the abdomen and pelvis was performed using the standard protocol following bolus administration of intravenous contrast. CONTRAST:  OMNIPAQUE IOHEXOL 300 MG/ML  SOLN COMPARISON:  None. FINDINGS: Lower chest: Moderate bilateral pleural effusions are noted with adjacent atelectasis of the lower lobes. Hepatobiliary: Solitary gallstone is noted in gallbladder. No inflammation is noted. No biliary dilatation is noted. No focal hepatic abnormality is noted. Pancreas: Unremarkable. No pancreatic ductal dilatation or surrounding inflammatory changes. Spleen: Spleen is lobulated in appearance without focal abnormality. Adrenals/Urinary Tract: Adrenal glands appear normal. Bilateral renal cortical scarring is noted most likely due to prior infection or infarctions. No hydronephrosis or  ureteral dilatation is noted on the right. Urinary bladder is decompressed secondary to Foley catheter. Mild proximal left hydroureteronephrosis is noted most likely due to retroperitoneal hematoma compressing distal left ureter. Stomach/Bowel: Distal tip of nasogastric tube is seen in stomach. There is no evidence of bowel obstruction or inflammation. The appendix is not visualized. Vascular/Lymphatic: Status post aortobifemoral bypass graft placement. The graft and its limbs are widely patent. However, moderate hematoma is seen surrounding the left limb of the graft in the retroperitoneal region of the left pelvis. Reproductive: Moderate prostatic enlargement is noted which extends into inferior portion of urinary bladder. Other: No hernia is noted. Midline surgical staples are noted. Mild anasarca is noted. Musculoskeletal: No acute or significant osseous findings. IMPRESSION: Status post aortobifemoral bypass graft placement. The graft and its limbs are widely patent. However, moderate retroperitoneal hematoma is seen surrounding the left limb of the graft in the left side of the pelvis. This appears to be resulting in compression of the distal left ureter with associated mild left hydronephrosis. These results will be called to the ordering clinician or representative by the Radiologist Assistant, and communication documented in the PACS or zVision Dashboard. Moderate bilateral pleural effusions are noted with adjacent atelectasis of both lower lobes. Bilateral renal cortical scarring is noted most likely due to prior infection or infarctions. Moderate prostatic enlargement is noted. Electronically Signed   By: Lupita Raider, M.D.   On: 02/08/2019 11:51    Cardiac Studies   none  Patient Profile     68 y.o. male admitted Aorta/bi-fem, complicated by PAF  Assessment & Plan    1. PAF - he is maintaining NSR. Continue IV amiodarone until he can be switched to po amio. I would suggest 200 mg bid when  he is able to take po. His dose can be weaned down by Dr. [name]. 2. Peripheral vascular disesase - his legs are warm but swollen.  3. Coags - he may remain on IV heparin. I would consider switching to an OAC or coumadin when he is able to take po. [facility] will sign off.   Medication Recommendations:  none Other recommendations (labs, testing, etc):  none Follow up as an outpatient:  Dr. Davonna Belling in 3-4 weeks.   For questions or updates, please contact [facility] Please consult [email or web address] for contact info under Cardiology/STEMI.      Signed, Lewayne Bunting, MD  02/09/2019, 9:31 AM  Patient ID: Mia Creek, male   DOB: 01/19/1951, 68 y.o.   MRN: [id number]

## 2019-02-09 NOTE — Progress Notes (Signed)
ANTICOAGULATION CONSULT NOTE - Follow Up Consult  Pharmacy Consult for Heparin Indication: atrial fibrillation  No Known Allergies  Patient Measurements: Height: 6\' 5"  (195.6 cm) Weight: 196 lb 11.2 oz (89.2 kg) IBW/kg (Calculated) : 89.1 Heparin Dosing Weight: 84 kg  Vital Signs: Temp: 98.2 F (36.8 C) (02/22 1630) Temp Source: Oral (02/22 1630) BP: 121/66 (02/22 1630) Pulse Rate: 67 (02/22 1630)  Labs: Recent Labs    02/07/19 0239 02/08/19 0400 02/09/19 0400 02/09/19 1826  HGB 9.4* 8.6* 8.3*  --   HCT 29.7* 27.9* 26.5*  --   PLT 306 290 314  --   LABPROT 16.8* 15.3* 15.2  --   INR 1.38 1.22 1.21  --   HEPARINUNFRC 0.37 0.35 0.26* 0.40  CREATININE 1.40* 1.29* 1.30*  --     Estimated Creatinine Clearance: 67.6 mL/min (A) (by C-G formula based on SCr of 1.3 mg/dL (H)).  Assessment: 56 yoM s/p aortabifem bypass, PMH s/f for afib on warfarin PTA. Continuing on IV heparin. Heparin level is now within goal range at 0.4 following rate increase this AM. Warfarin to remain on hold for ileus until patient can tolerate liquids. Hgb low stable, pltc WNL. No bleeding noted.  PTA warfarin dose: 1.5mg  daily (last dose 2/12)  Goal of Therapy:  Heparin level 0.3-0.5 units/ml, lower end of therapeutic range Monitor platelets by anticoagulation protocol: Yes   Plan:  Continue heparin drip at 1800 units/hr Daily heparin level and CBC Monitor s/sx of bleeding F/u resume warfarin when appropriate  Thank you for allowing [place] to participate in this patients care.   Roderic Scarce Zigmund Daniel, PharmD, BCPS PGY2 Infectious Diseases Pharmacy Resident Phone: [phone] 02/09/2019 6:54 PM

## 2019-02-10 LAB — COMPREHENSIVE METABOLIC PANEL
ALT: 28 U/L (ref 0–44)
AST: 27 U/L (ref 15–41)
Albumin: 1.7 g/dL — ABNORMAL LOW (ref 3.5–5.0)
Alkaline Phosphatase: 71 U/L (ref 38–126)
Anion gap: 6 (ref 5–15)
BUN: 17 mg/dL (ref 8–23)
CHLORIDE: 104 mmol/L (ref 98–111)
CO2: 21 mmol/L — ABNORMAL LOW (ref 22–32)
Calcium: 6.9 mg/dL — ABNORMAL LOW (ref 8.9–10.3)
Creatinine, Ser: 1.19 mg/dL (ref 0.61–1.24)
GFR calc Af Amer: >60 mL/min (ref >60)
GFR calc non Af Amer: >60 mL/min (ref >60)
Glucose, Bld: 116 mg/dL — ABNORMAL HIGH (ref 70–99)
Potassium: 4.1 mmol/L (ref 3.5–5.1)
Sodium: 131 mmol/L — ABNORMAL LOW (ref 135–145)
Total Bilirubin: 1.6 mg/dL — ABNORMAL HIGH (ref 0.3–1.2)
Total Protein: 5.1 g/dL — ABNORMAL LOW (ref 6.5–8.1)

## 2019-02-10 LAB — PHOSPHORUS: Phosphorus: 3.2 mg/dL (ref 2.5–4.6)

## 2019-02-10 LAB — CBC
HCT: 25.1 % — ABNORMAL LOW (ref 39.0–52.0)
Hemoglobin: 8.2 g/dL — ABNORMAL LOW (ref 13.0–17.0)
MCH: 30.7 pg (ref 26.0–34.0)
MCHC: 32.7 g/dL (ref 30.0–36.0)
MCV: 94 fL (ref 80.0–100.0)
Platelets: 302 10*3/uL (ref 150–400)
RBC: 2.67 MIL/uL — ABNORMAL LOW (ref 4.22–5.81)
RDW: 15.7 % — ABNORMAL HIGH (ref 11.5–15.5)
WBC: 21.5 10*3/uL — ABNORMAL HIGH (ref 4.0–10.5)
nRBC: 0 % (ref 0.0–0.2)

## 2019-02-10 LAB — GLUCOSE, CAPILLARY
Glucose-Capillary: 124 mg/dL — ABNORMAL HIGH (ref 70–99)
Glucose-Capillary: 131 mg/dL — ABNORMAL HIGH (ref 70–99)

## 2019-02-10 LAB — MAGNESIUM: Magnesium: 2 mg/dL (ref 1.7–2.4)

## 2019-02-10 LAB — HEPARIN LEVEL (UNFRACTIONATED): Heparin Unfractionated: 0.42 IU/mL (ref 0.30–0.70)

## 2019-02-10 LAB — PROTIME-INR
INR: 1.18
PROTHROMBIN TIME: 14.9 s (ref 11.4–15.2)

## 2019-02-10 MED ORDER — TRAVASOL 10 % IV SOLN
INTRAVENOUS | Status: AC
  Administered 2019-02-10: 18:00:00 via INTRAVENOUS
  Filled 2019-02-10: qty 1209.6

## 2019-02-10 NOTE — Progress Notes (Signed)
Physical Therapy Treatment Patient Details Name: [name] [name] MRN: [id number] DOB: November 27, 1951 Today's Date: 02/10/2019    History of Present Illness Pt s/p Aortobifemoral bypass on 2/10. Pt developed afib with rvr, acute renal failure and ileus 2/12-2/13. Pt had NG tube for ileus that was removed 2/17 and had to be replaced 2/19. 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     PT Comments    Patient seen for activity progression. Tolerated OOB with ambulation from bed to door and chair follow, then required increased assist for all aspects of mobility. Current POC remains appropriate. Recommend SNF.  Follow Up Recommendations  SNF;Supervision/Assistance - 24 hour     Equipment Recommendations  Other (comment)(To be determined)    Recommendations for Other Services       Precautions / Restrictions Precautions Precautions: Fall;Other (comment)(expressive aphasia, watch HR) Restrictions Weight Bearing Restrictions: No    Mobility  Bed Mobility Overal bed mobility: Needs Assistance Bed Mobility: Supine to Sit     Supine to sit: Mod assist;+2 for safety/equipment     General bed mobility comments: Assist to elevate trunk and rotate hips to EOB  Transfers Overall transfer level: Needs assistance Equipment used: Rolling walker (2 wheeled) Transfers: Sit to/from [employer] Sit to Stand: Mod assist         General transfer comment: Assist to power up to standing with heavy reliance on RW.  Ambulation/Gait Ambulation/Gait assistance: Min assist;+2 safety/equipment Gait Distance (Feet): 12 Feet Assistive device: Rolling walker (2 wheeled) Gait Pattern/deviations: Decreased step length - right;Decreased step length - left;Shuffle;Step-to pattern Gait velocity: decr Gait velocity interpretation: <1.31 ft/sec, indicative of household ambulator General Gait Details: Hands on assist for stability, flexed posture with  reliance on UE support via RW, increased time and effort, fatigues quickly   Stairs             Wheelchair Mobility    Modified Rankin (Stroke Patients Only)       Balance Overall balance assessment: Needs assistance Sitting-balance support: No upper extremity supported;Feet supported Sitting balance-Leahy Scale: Fair     Standing balance support: Bilateral upper extremity supported Standing balance-Leahy Scale: Poor Standing balance comment: walker and min assist for static standing                            Cognition Arousal/Alertness: Awake/alert Behavior During Therapy: WFL for tasks assessed/performed Overall Cognitive Status: Difficult to assess                                 General Comments: Believe pt is at baseline. Pt follows commands.      Exercises      General Comments        Pertinent Vitals/Pain Pain Assessment: Faces Faces Pain Scale: Hurts little more Pain Location: abdomen Pain Descriptors / Indicators: Grimacing Pain Intervention(s): Monitored during session    Home Living                      Prior Function            PT Goals (current goals can now be found in the care plan section) Acute Rehab PT Goals Patient Stated Goal: none stated PT Goal Formulation: With patient Time For Goal Achievement: 02/21/19 Potential to Achieve Goals: Good Progress towards PT goals: Progressing toward goals  Frequency    Min 2X/week      PT Plan Current plan remains appropriate;Frequency needs to be updated    Co-evaluation              AM-PAC PT "6 Clicks" Mobility   Outcome Measure  Help needed turning from your back to your side while in a flat bed without using bedrails?: A Lot Help needed moving from lying on your back to sitting on the side of a flat bed without using bedrails?: A Lot Help needed moving to and from a bed to a chair (including a wheelchair)?: A Lot Help needed  standing up from a chair using your arms (e.g., wheelchair or bedside chair)?: A Lot Help needed to walk in hospital room?: A Lot Help needed climbing 3-5 steps with a railing? : A Lot 6 Click Score: 12    End of Session Equipment Utilized During Treatment: Gait belt Activity Tolerance: Patient limited by fatigue Patient left: with call bell/phone within reach;in chair Nurse Communication: Mobility status PT Visit Diagnosis: Other abnormalities of gait and mobility (R26.89);Muscle weakness (generalized) (M62.81)     Time: 6789-3810 PT Time Calculation (min) (ACUTE ONLY): 21 min  Charges:  $Gait Training: 8-22 mins                     Charlotte Crumb, PT DPT  Board Certified Neurologic Specialist Acute Rehabilitation Services Pager [phone] Office [phone]    Fabio Asa 02/10/2019, 8:55 AM

## 2019-02-10 NOTE — Plan of Care (Addendum)
Care plan has been reviewed:  Problem: Bowel/Gastric: 12 Days Post-Op s/p: Aortobifemoral bypass complicated by postoperative ileus secondary to retroperitoneal hematoma Goal: Gastrointestinal status for postoperative course will improve Outcome: Progressing: NPO with TPN 90 ml/hr, nasogastric tube with low continuous suction at -50 mmHg,gastric content was green, out put recorded.    Problem: Infection: risk of infection Goal: Risk for infection will decrease  Outcome: Progressing: no signs and symptom of infection, afebrile, surgical wounds were dry clean and intact, Vital signs stable.   Problem: Tissue Perfusion: Hx of  post -op Atrial fib with RVR and Pre-op Dx distal aortic occlusion. Goal: Hemodynamically stable with effective tissue perfusion will improve Outcome: Progressing: hemodynamic has been stable tonight. Sinus rhythm on monitor, HR 68-80. No distress. He had good peripheral perfusion all extremities. Bilateral lower legs 3+ pitting edema.   Problem: Pain Managment: surgical wounds. Goal: General experience of comfort will improve Outcome: Progressing: no complaint of pain, Pt stated he had no pain and refused to take pain med. He slept well tonight.   Problem: Elimination: Hx of urinary retention,  Day shift RN reported urine was tea color. Night shift was yellowish and clear.  Goal: Will not experience complications related to bowel motility and urine out put. Outcome: Progressing:Pt has had Foley cath with tremendous amount of urine out put today. Bowel movement was recorded on 02/08/2019.   Problem: Activity:  bedrest, limit movement due to surgical wounds and LAD.( PICC line, peripheral line with IV infusing, foley cath, NG tube) Goal: Risk for activity intolerance will decrease Outcome: Progressing: requires moderate assist to turn on his bed. On Heparin gtt.  [name], BSN,RN,PCCN-CMC

## 2019-02-10 NOTE — Progress Notes (Signed)
PHARMACY - ADULT TOTAL PARENTERAL NUTRITION CONSULT NOTE   Pharmacy Consult:  TPN Indication:  Prolonged ileus  Patient Measurements: Height: 6\' 5"  (195.6 cm) Weight: 196 lb 11.2 oz (89.2 kg) IBW/kg (Calculated) : 89.1 TPN AdjBW (KG): 84.4 Body mass index is 23.33 kg/m.  Assessment:  72 YOM with history of LE weakness and CVA from AFib.  He presented on 01/28/19 for aorta bifemoral bypass graft, femoral endarterectomy and iliac thrombectomy.  Patient was started on a clear liquid diet post-op but intake was inadequate.  NG tube placed on 01/31/19 and patient was made NPO again.  He was started on clears on 02/05/19 and vomitted.  Pharmacy consulted to manage TPN for nutrition support.    Patient has aphasia and reports he eats primarily soup and was eating regularly up until the day of admission.  He has minimal to no intake for ~10 days and is at risk for refeeding.  GI: persistent ileus, NG O/P .  Prealbumin low at 6,  LBM 2/17. PPI IV, PRN Zofran Endo: no hx DM - CBGs controlled Insulin requirements in the past 24 hours: 2 units Lytes: low Na/CO2, others WNL (CoCa low normal) Renal: SCr 1.3, BUN WNL - UOP 2.5 ml/kg/hr, NS at 60/hr, net +8.9L 2/21 CT showed left hydronephrosis Pulm: stable on RA Cards: VSS (in Afib) - ASA, amiodarone gtt AC: Heparin for Afib - hgb 8s, plts normalized.  1/21 CT showed RP hematoma Hepatobil: LFTs / TG WNL, tbili down to 1.6 (no jaundice) Neuro: PVD/CVA - ASA, PRN Dilaudid ID: afebrile, WBC down to 21.5, UCx negative - not on abx TPN Access: PICC placed 02/03/19 TPN start date: 02/06/19  Nutritional Goals (RD rec 2/17): 2200-2400 kCal, 110-125g protein, >/= 2.2L fluid per day  Current Nutrition:  TPN  Plan:  Continue TPN at goal rate 90 ml/hr, providing 121g AA, 324g CHO and 69g ILE for a total of 2275 kCal, meeting 100% of patient's needs. Electrolytes in TPN: increase Na/Ca, Cl:Ac 1:2 Daily multivitamin and trace elements in TPN D/C  SSI/CBG checks NS at 60 ml/hr (total fluid = 150 ml/hr per Vascular ) F/U AM labs  [name] [name] D. Laney Potash, PharmD, BCPS, BCCCP 02/10/2019, 8:35 AM

## 2019-02-10 NOTE — Progress Notes (Signed)
ANTICOAGULATION CONSULT NOTE - Follow Up Consult  Pharmacy Consult for Heparin Indication: atrial fibrillation  No Known Allergies  Patient Measurements: Height: 6\' 5"  (195.6 cm) Weight: 196 lb 11.2 oz (89.2 kg) IBW/kg (Calculated) : 89.1 Heparin Dosing Weight: 84 kg  Vital Signs: Temp: 99.6 F (37.6 C) (02/23 0800) Temp Source: Oral (02/23 0800) BP: 117/66 (02/23 0800) Pulse Rate: 66 (02/23 0800)  Labs: Recent Labs    02/08/19 0400 02/09/19 0400 02/09/19 1826 02/10/19 0247  HGB 8.6* 8.3*  --  8.2*  HCT 27.9* 26.5*  --  25.1*  PLT 290 314  --  302  LABPROT 15.3* 15.2  --  14.9  INR 1.22 1.21  --  1.18  HEPARINUNFRC 0.35 0.26* 0.40 0.42  CREATININE 1.29* 1.30*  --  1.19    Estimated Creatinine Clearance: 73.8 mL/min (by C-G formula based on SCr of 1.19 mg/dL).  Assessment: 17 yoM s/p aortabifem bypass on heparin infusion. Pt on warfarin PTA for hx of AFib, last dose was 2/12 which is now on hold due to ileus. Pharmacy consulted to resume heparin 2/14 with no bolus.  Heparin level remains therapeutic, CBC stable, INR low as expected after holding warfarin.  Goal of Therapy:  Heparin level 0.3-0.5 units/ml, lower end of therapeutic range Monitor platelets by anticoagulation protocol: Yes   Plan:  -Continue heparin 1800 units/hr -Holding warfarin for now, will resume once tolerating liquids -Daily heparin level, CBC and INR  Fredonia Highland, PharmD, BCPS Clinical Pharmacist [phone] Please check AMION for all [facility] Pharmacy numbers 02/10/2019

## 2019-02-11 LAB — PROTIME-INR
INR: 1.28
Prothrombin Time: 15.9 seconds — ABNORMAL HIGH (ref 11.4–15.2)

## 2019-02-11 LAB — COMPREHENSIVE METABOLIC PANEL
ALBUMIN: 1.8 g/dL — AB (ref 3.5–5.0)
ALT: 38 U/L (ref 0–44)
AST: 41 U/L (ref 15–41)
Alkaline Phosphatase: 84 U/L (ref 38–126)
Anion gap: 7 (ref 5–15)
BUN: 19 mg/dL (ref 8–23)
CO2: 22 mmol/L (ref 22–32)
Calcium: 7.3 mg/dL — ABNORMAL LOW (ref 8.9–10.3)
Chloride: 101 mmol/L (ref 98–111)
Creatinine, Ser: 1.43 mg/dL — ABNORMAL HIGH (ref 0.61–1.24)
GFR calc Af Amer: 58 mL/min — ABNORMAL LOW (ref >60)
GFR calc non Af Amer: 50 mL/min — ABNORMAL LOW (ref >60)
GLUCOSE: 118 mg/dL — AB (ref 70–99)
Potassium: 4.3 mmol/L (ref 3.5–5.1)
Sodium: 130 mmol/L — ABNORMAL LOW (ref 135–145)
Total Bilirubin: 2.1 mg/dL — ABNORMAL HIGH (ref 0.3–1.2)
Total Protein: 5.5 g/dL — ABNORMAL LOW (ref 6.5–8.1)

## 2019-02-11 LAB — CBC
HEMATOCRIT: 23.2 % — AB (ref 39.0–52.0)
Hemoglobin: 7.5 g/dL — ABNORMAL LOW (ref 13.0–17.0)
MCH: 30.1 pg (ref 26.0–34.0)
MCHC: 32.3 g/dL (ref 30.0–36.0)
MCV: 93.2 fL (ref 80.0–100.0)
Platelets: 289 10*3/uL (ref 150–400)
RBC: 2.49 MIL/uL — ABNORMAL LOW (ref 4.22–5.81)
RDW: 15.5 % (ref 11.5–15.5)
WBC: 12.6 10*3/uL — AB (ref 4.0–10.5)
nRBC: 0 % (ref 0.0–0.2)

## 2019-02-11 LAB — MAGNESIUM: Magnesium: 2 mg/dL (ref 1.7–2.4)

## 2019-02-11 LAB — DIFFERENTIAL
ABS IMMATURE GRANULOCYTES: 0.26 10*3/uL — AB (ref 0.00–0.07)
Basophils Absolute: 0 10*3/uL (ref 0.0–0.1)
Basophils Relative: 0 %
Eosinophils Absolute: 0.1 10*3/uL (ref 0.0–0.5)
Eosinophils Relative: 1 %
Immature Granulocytes: 2 %
Lymphocytes Relative: 6 %
Lymphs Abs: 0.8 10*3/uL (ref 0.7–4.0)
Monocytes Absolute: 0.6 10*3/uL (ref 0.1–1.0)
Monocytes Relative: 5 %
NEUTROS ABS: 10.8 10*3/uL — AB (ref 1.7–7.7)
NEUTROS PCT: 86 %

## 2019-02-11 LAB — HEPARIN LEVEL (UNFRACTIONATED): Heparin Unfractionated: 0.42 IU/mL (ref 0.30–0.70)

## 2019-02-11 LAB — PHOSPHORUS: Phosphorus: 3.6 mg/dL (ref 2.5–4.6)

## 2019-02-11 LAB — TRIGLYCERIDES: Triglycerides: 56 mg/dL (ref <150)

## 2019-02-11 LAB — PREALBUMIN: Prealbumin: 7.7 mg/dL — ABNORMAL LOW (ref 18–38)

## 2019-02-11 MED ORDER — TRAVASOL 10 % IV SOLN
INTRAVENOUS | Status: AC
  Administered 2019-02-11: 17:00:00 via INTRAVENOUS
  Filled 2019-02-11: qty 1209.6

## 2019-02-11 NOTE — Progress Notes (Signed)
Pt has had stable blood glucose from the past several days.Per pharmacist recommendation,it is not necessary to do CBG checking anymore ,eventhough Pt is NPO and on TPN. Pt doesn't have signs of hypoglycemia and he does not have diabetes.  Will continue to monitor.   [name] [name], BSN,RN,PCCN-CMC

## 2019-02-11 NOTE — Progress Notes (Signed)
ANTICOAGULATION CONSULT NOTE - Follow Up Consult  Pharmacy Consult for Heparin Indication: atrial fibrillation  No Known Allergies  Patient Measurements: Height: 6\' 5"  (195.6 cm) Weight: 196 lb 11.2 oz (89.2 kg) IBW/kg (Calculated) : 89.1 Heparin Dosing Weight: 84 kg  Vital Signs: Temp: 98.3 F (36.8 C) (02/24 0350) Temp Source: Oral (02/24 0350) BP: 117/60 (02/24 0600) Pulse Rate: 65 (02/24 0600)  Labs: Recent Labs    02/09/19 0400 02/09/19 1826 02/10/19 0247 02/11/19 0408  HGB 8.3*  --  8.2* 7.5*  HCT 26.5*  --  25.1* 23.2*  PLT 314  --  302 289  LABPROT 15.2  --  14.9 15.9*  INR 1.21  --  1.18 1.28  HEPARINUNFRC 0.26* 0.40 0.42 0.42  CREATININE 1.30*  --  1.19 1.43*    Estimated Creatinine Clearance: 61.4 mL/min (A) (by C-G formula based on SCr of 1.43 mg/dL (H)).  Assessment: 62 yoM s/p aortabifem bypass on heparin infusion. Pt on warfarin PTA for hx of AFib, last dose was 2/12 which is now on hold due to ileus. Pharmacy consulted to resume heparin 2/14 with no bolus.  Heparin level remains therapeutic, hgb down slightly to 7.5, INR trended up slightly to 1.28.  Goal of Therapy:  Heparin level 0.3-0.5 units/ml, lower end of therapeutic range Monitor platelets by anticoagulation protocol: Yes   Plan:  -Continue heparin 1800 units/hr -Holding warfarin for now, will resume once tolerating liquids -Daily heparin level, CBC and INR  Sheppard Coil PharmD., BCPS Clinical Pharmacist 02/11/2019 8:57 AM

## 2019-02-11 NOTE — Progress Notes (Signed)
Physical Therapy Treatment Patient Details Name: [name] [name] MRN: [id number] DOB: 1951/07/28 Today's Date: 02/11/2019    History of Present Illness Pt s/p Aortobifemoral bypass on 2/10. Pt developed afib with rvr, acute renal failure and ileus 2/12-2/13. Pt had NG tube for ileus that was removed 2/17 and had to be replaced 2/19. 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    PT Comments    Patient calling for assist due to alarms sounding in room, wanting OOB activity so patient seen for activity progression today. Tolerated 2 trials of ambulation today, increasing overall distance and activity tolerance. Patient very motivated. If patient continues to show evidence of progression and increased motivation, may reconsider disposition and attempt to reassess for [name]. Will follow and progress as possible.  Follow Up Recommendations  SNF;Supervision/Assistance - 24 hour     Equipment Recommendations  Other (comment)(To be determined)    Recommendations for Other Services       Precautions / Restrictions Precautions Precautions: Fall;Other (comment)(expressive aphasia, watch HR) Restrictions Weight Bearing Restrictions: No    Mobility  Bed Mobility Overal bed mobility: Needs Assistance Bed Mobility: Supine to Sit     Supine to sit: Mod assist;+2 for safety/equipment     General bed mobility comments: Assist to elevate trunk and rotate hips to EOB  Transfers Overall transfer level: Needs assistance Equipment used: Rolling walker (2 wheeled) Transfers: Sit to/from [employer] Sit to Stand: Min assist         General transfer comment: Assist to power up to standing with heavy reliance on RW.  Ambulation/Gait Ambulation/Gait assistance: Min assist;+2 safety/equipment Gait Distance (Feet): 22 [id number] with RW) Assistive device: Rolling walker (2 wheeled) Gait Pattern/deviations: Decreased step length -  right;Decreased step length - left;Shuffle;Step-to pattern Gait velocity: decr Gait velocity interpretation: <1.31 ft/sec, indicative of household ambulator General Gait Details: Hands on assist for stability, flexed posture with reliance on UE support via RW, increased time and effort, fatigues quickly   Stairs             Wheelchair Mobility    Modified Rankin (Stroke Patients Only)       Balance Overall balance assessment: Needs assistance Sitting-balance support: No upper extremity supported;Feet supported Sitting balance-Leahy Scale: Fair     Standing balance support: Bilateral upper extremity supported Standing balance-Leahy Scale: Poor Standing balance comment: walker and min assist for static standing                            Cognition Arousal/Alertness: Awake/alert Behavior During Therapy: WFL for tasks assessed/performed Overall Cognitive Status: Difficult to assess                                 General Comments: patient following commands consistently and communicating re: pain and functional status vis minimal verbalizations and hand gestures      Exercises      General Comments        Pertinent Vitals/Pain Faces Pain Scale: No hurt    Home Living                      Prior Function            PT Goals (current goals can now be found in the care plan section) Acute Rehab PT Goals Patient Stated Goal: none stated  PT Goal Formulation: With patient Time For Goal Achievement: 02/21/19 Potential to Achieve Goals: Good Progress towards PT goals: Progressing toward goals    Frequency    Min 3X/week      PT Plan Current plan remains appropriate;Frequency needs to be updated    Co-evaluation PT/OT/SLP Co-Evaluation/Treatment: Yes Reason for Co-Treatment: Complexity of the patient's impairments (multi-system involvement) PT goals addressed during session: Mobility/safety with mobility OT goals  addressed during session: ADL's and self-care      AM-PAC PT "6 Clicks" Mobility   Outcome Measure  Help needed turning from your back to your side while in a flat bed without using bedrails?: A Lot Help needed moving from lying on your back to sitting on the side of a flat bed without using bedrails?: A Lot Help needed moving to and from a bed to a chair (including a wheelchair)?: A Lot Help needed standing up from a chair using your arms (e.g., wheelchair or bedside chair)?: A Lot Help needed to walk in hospital room?: A Lot Help needed climbing 3-5 steps with a railing? : A Lot 6 Click Score: 12    End of Session Equipment Utilized During Treatment: Gait belt Activity Tolerance: Patient limited by fatigue Patient left: with call bell/phone within reach;in chair Nurse Communication: Mobility status PT Visit Diagnosis: Other abnormalities of gait and mobility (R26.89);Muscle weakness (generalized) (M62.81)     Time: 5809-9833 PT Time Calculation (min) (ACUTE ONLY): 24 min  Charges:  $Gait Training: 8-22 mins                     Charlotte Crumb, PT DPT  Board Certified Neurologic Specialist Acute Rehabilitation Services Pager [phone] Office [phone]    Fabio Asa 02/11/2019, 11:45 AM

## 2019-02-11 NOTE — Progress Notes (Signed)
PHARMACY - ADULT TOTAL PARENTERAL NUTRITION CONSULT NOTE   Pharmacy Consult:  TPN Indication:  Prolonged ileus  Patient Measurements: Height: 6\' 5"  (195.6 cm) Weight: 196 lb 11.2 oz (89.2 kg) IBW/kg (Calculated) : 89.1 TPN AdjBW (KG): 84.4 Body mass index is 23.33 kg/m.  Assessment:  10 YOM with history of LE weakness and CVA from AFib.  He presented on 01/28/19 for aorta bifemoral bypass graft, femoral endarterectomy and iliac thrombectomy.  Patient was started on a clear liquid diet post-op but intake was inadequate.  NG tube placed on 01/31/19 and patient was made NPO again.  He was started on clears on 02/05/19 and vomitted.  Pharmacy consulted to manage TPN for nutrition support.    Patient has aphasia and reports he eats primarily soup and was eating regularly up until the day of admission.  He has minimal to no intake for ~10 days and is at risk for refeeding.  GI: persistent ileus. Prealbumin improved to 7.7, NG O/P , LBM 2/17. PPI IV, PRN Zofran Endo: no hx DM - CBGs controlled.  SSI d/c'ed 02/10/19. Lytes: low Na, others WNL Renal: SCr up 1.43, BUN WNL - UOP 2.6 ml/kg/hr, NS at 60/hr, net +7.4L 2/21 CT showed left hydronephrosis Pulm: stable on RA Cards: VSS (in Afib) - amiodarone gtt, off ASA AC: Heparin for Afib - hgb 7s, plts normalized.  2/21 CT showed RP hematoma Hepatobil: LFTs / TG WNL, tbili up to 2.1 (no jaundice) Neuro: PVD/CVA - PRN Dilaudid ID: [email or web address], WBC down to 12.6, UCx negative - not on abx TPN Access: PICC placed 02/03/19 TPN start date: 02/06/19  Nutritional Goals (RD rec 2/17): 2200-2400 kCal, 110-125g protein, >/= 2.2L fluid per day  Current Nutrition:  TPN  Plan:  Continue TPN at goal rate 90 ml/hr, providing 121g AA, 324g CHO and 69g ILE for a total of 2275 kCal, meeting 100% of patient's needs. Electrolytes in TPN: max Na, Cl:Ac 1:2 Daily multivitamin and trace elements in TPN NS at 60 ml/hr (total fluid = 150 ml/hr per Vascular ) Repeat  labs on Wed  An Lannan D. Laney Potash, PharmD, BCPS, BCCCP 02/11/2019, 8:23 AM

## 2019-02-11 NOTE — Progress Notes (Signed)
Pt progressing well in all areas. Min assist for bed mobility and min +2 for safety/lines to ambulate x 2 bouts. Pt performed UB dressing with min assist and grooming with min to moderate assistance. Continue to recommend [name].  02/11/19 1144  OT Visit Information  Last OT Received On 02/11/19  Assistance Needed +2  PT/OT/SLP Co-Evaluation/Treatment Yes  Reason for Co-Treatment Complexity of the patient's impairments (multi-system involvement);For patient/therapist safety  OT goals addressed during session ADL's and self-care  History of Present Illness Pt s/p Aortobifemoral bypass on 2/10. Pt developed afib with rvr, acute renal failure and ileus 2/12-2/13. Pt had NG tube for ileus that was removed 2/17 and had to be replaced 2/19. 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   Precautions  Precautions Fall;Other (comment) (expressive aphasia, NGT)  Pain Assessment  Pain Assessment Faces  Faces Pain Scale 4  Pain Location abdomen  Pain Descriptors / Indicators Grimacing  Pain Intervention(s) Monitored during session;Repositioned  Cognition  Arousal/Alertness Awake/alert  Behavior During Therapy Flat affect  Overall Cognitive Status Difficult to assess  General Comments patient following commands consistently and communicating re: pain and functional status vis minimal verbalizations and hand gestures  Difficult to assess due to Impaired communication (low volume, expressive aphasia)  Upper Extremity Assessment  Upper Extremity Assessment RUE deficits/detail  RUE Deficits / Details posturing R UE in flexion and supination, requires assist to place R UE on walker  RUE Coordination decreased fine motor;decreased gross motor  ADL  Overall ADL's  Needs assistance/impaired  Grooming Wash/dry face;Sitting;Set up;Brushing hair;Moderate assistance  Grooming Details (indicate cue type and reason) difficulty coordinating L UE use on comb  Upper Body Dressing   Minimal assistance;Sitting  Upper Body Dressing Details (indicate cue type and reason) front opening gown  Functional mobility during ADLs Minimal assistance;Rolling walker;+2 for safety/equipment  Bed Mobility  Overal bed mobility Needs Assistance  Bed Mobility Rolling;Sidelying to Sit  Rolling Min assist  Sidelying to sit Min assist  General bed mobility comments cues for log roll technique to minimize abdominal pain, assist to raise trunk and position R hip at EOB  Balance  Overall balance assessment Needs assistance  Sitting balance-Leahy Scale Fair  Standing balance-Leahy Scale Poor  Standing balance comment walker and min assist for static standing  Transfers  Overall transfer level Needs assistance  Equipment used Rolling walker (2 wheeled)  Transfers Sit to/from Stand  Sit to Stand Min assist;+2 safety/equipment;Mod assist  General transfer comment +2 min from elevated surface, +2 mod from lower surface of recliner  OT - End of Session  Equipment Utilized During Treatment Gait belt;Rolling walker  Activity Tolerance Patient tolerated treatment well  Patient left in chair;with call bell/phone within reach  OT Assessment/Plan  OT Plan Discharge plan remains appropriate  OT Visit Diagnosis Unsteadiness on feet (R26.81);Other abnormalities of gait and mobility (R26.89);Muscle weakness (generalized) (M62.81)  OT Frequency (ACUTE ONLY) Min 2X/week  Follow Up Recommendations [name];Supervision/Assistance - 24 hour  AM-PAC OT "6 Clicks" Daily Activity Outcome Measure (Version 2)  Help from another person eating meals? 1  Help from another person taking care of personal grooming? 2  Help from another person toileting, which includes using toliet, bedpan, or urinal? 2  Help from another person bathing (including washing, rinsing, drying)? 2  Help from another person to put on and taking off regular upper body clothing? 3  Help from another person to put on and taking off regular lower  body  clothing? 1  6 Click Score 11  OT Goal Progression  Progress towards OT goals Progressing toward goals  Acute Rehab OT Goals  Patient Stated Goal none stated  OT Goal Formulation With patient  Time For Goal Achievement 02/12/19  Potential to Achieve Goals Good  OT Time Calculation  OT Start Time (ACUTE ONLY) 1105  OT Stop Time (ACUTE ONLY) 1129  OT Time Calculation (min) 24 min  OT General Charges  $OT Visit 1 Visit  OT Treatments  $Self Care/Home Management  8-22 mins

## 2019-02-11 NOTE — Progress Notes (Addendum)
[facility]  Subjective  - feels ok still no flatus or BM   Objective 117/60 65 98.3 F (36.8 C) (Oral) 16 97%  Intake/Output Summary (Last 24 hours) at 02/11/2019 1213 Last data filed at 02/11/2019 0858 Gross per 24 hour  Intake 4716.75 ml  Output 5175 ml  Net -458.25 ml   Abdomen soft minimal bowel sounds incision abdomen groins healing Extremities edematous feet pink warm bilaterally  NG tube 600 ml yesterday and so far 600 today  Total IV fluid around 200 mL/hr between TPN heparin amio maintenance  Assessment/Planning: Hyponatremia need to adjust TPN and maintenance fluids to increase sodium  Acute renal failure creatinine 1.4 today.  Trend for now.  Increase IV F if continues to rise  Aortobifem with left retroperitoneal hematoma now with ileus most likely secondary to this.  POD 14 will d/c staples today.  Continue NG until he has consistent bowel function  Ambulate up in chair as much as possible PT/OT  Afib currently good rate control PO warfarin and beta blocker when starts to take PO  Acute blood loss anemia transfuse if symptomatic with heart rate urine output or less than 7  [name] [name] 02/11/2019 12:13 PM --  Laboratory Lab Results: Recent Labs    02/10/19 0247 02/11/19 0408  WBC 21.5* 12.6*  HGB 8.2* 7.5*  HCT 25.1* 23.2*  PLT 302 289   BMET Recent Labs    02/10/19 0247 02/11/19 0408  NA 131* 130*  K 4.1 4.3  CL 104 101  CO2 21* 22  GLUCOSE 116* 118*  BUN 17 19  CREATININE 1.19 1.43*  CALCIUM 6.9* 7.3*    COAG Lab Results  Component Value Date   INR 1.28 02/11/2019   INR 1.18 02/10/2019   INR 1.21 02/09/2019   No results found for: PTT

## 2019-02-12 ENCOUNTER — Inpatient Hospital Stay (HOSPITAL_COMMUNITY): Payer: Medicare HMO

## 2019-02-12 LAB — COMPREHENSIVE METABOLIC PANEL
ALT: 51 U/L — ABNORMAL HIGH (ref 0–44)
AST: 55 U/L — ABNORMAL HIGH (ref 15–41)
Albumin: 1.8 g/dL — ABNORMAL LOW (ref 3.5–5.0)
Alkaline Phosphatase: 90 U/L (ref 38–126)
Anion gap: 9 (ref 5–15)
BUN: 22 mg/dL (ref 8–23)
CO2: 21 mmol/L — ABNORMAL LOW (ref 22–32)
Calcium: 7.5 mg/dL — ABNORMAL LOW (ref 8.9–10.3)
Chloride: 102 mmol/L (ref 98–111)
Creatinine, Ser: 1.44 mg/dL — ABNORMAL HIGH (ref 0.61–1.24)
GFR calc Af Amer: 57 mL/min — ABNORMAL LOW (ref >60)
GFR calc non Af Amer: 49 mL/min — ABNORMAL LOW (ref >60)
Glucose, Bld: 115 mg/dL — ABNORMAL HIGH (ref 70–99)
Potassium: 4.2 mmol/L (ref 3.5–5.1)
SODIUM: 132 mmol/L — AB (ref 135–145)
Total Bilirubin: 2.1 mg/dL — ABNORMAL HIGH (ref 0.3–1.2)
Total Protein: 5.5 g/dL — ABNORMAL LOW (ref 6.5–8.1)

## 2019-02-12 LAB — HEPARIN LEVEL (UNFRACTIONATED): Heparin Unfractionated: 0.45 IU/mL (ref 0.30–0.70)

## 2019-02-12 LAB — CBC
HCT: 28.5 % — ABNORMAL LOW (ref 39.0–52.0)
Hemoglobin: 9 g/dL — ABNORMAL LOW (ref 13.0–17.0)
MCH: 29.5 pg (ref 26.0–34.0)
MCHC: 31.6 g/dL (ref 30.0–36.0)
MCV: 93.4 fL (ref 80.0–100.0)
NRBC: 0 % (ref 0.0–0.2)
Platelets: 245 10*3/uL (ref 150–400)
RBC: 3.05 MIL/uL — ABNORMAL LOW (ref 4.22–5.81)
RDW: 15.5 % (ref 11.5–15.5)
WBC: 11.2 10*3/uL — ABNORMAL HIGH (ref 4.0–10.5)

## 2019-02-12 LAB — PROTIME-INR
INR: 1.3 — AB (ref 0.8–1.2)
Prothrombin Time: 16.1 seconds — ABNORMAL HIGH (ref 11.4–15.2)

## 2019-02-12 MED ORDER — TRAVASOL 10 % IV SOLN
INTRAVENOUS | Status: DC
  Administered 2019-02-12: 18:00:00 via INTRAVENOUS
  Filled 2019-02-12: qty 1209.6

## 2019-02-12 NOTE — Clinical Social Work Note (Signed)
Clinical Social Work Assessment  Patient Details  Name: [name] [name] MRN: [id number] Date of Birth: 1951-01-05  Date of referral:  02/12/19               Reason for consult:  Facility Placement                Permission sought to share information with:  Facility Medical sales representative Permission granted to share information::  Yes, Verbal Permission Granted  Name::     Claude Manges   Agency::  SNFs  Relationship::  sister   Contact Information:  [phone]  Housing/Transportation Living arrangements for the past 2 months:  Single Family Home Source of Information:  Patient, Siblings Patient Interpreter Needed:  None Criminal Activity/Legal Involvement Pertinent to Current Situation/Hospitalization:  No - Comment as needed Significant Relationships:  Siblings Lives with:  Self Do you feel safe going back to the place where you live?  No(pateint agrees to SNF placement ) Need for family participation in patient care:  Yes (Comment)  Care giving concerns:  CSW received consult for discharge needs. CSW spoke with patient regarding PT recommendation of SNF placement at time of discharge. Patient unable to speak but nodded his head in agreement with SNF placement. Patient states he lives in a single level home alone.     Social Worker assessment / plan:  CSW spoke with patient concerning possibility of rehab at [facility] before returning home once he medically stable.    Employment status:    Insurance information:  Medicare PT Recommendations:  Skilled Nursing Facility Information / Referral to community resources:  Skilled Nursing Facility  Patient/Family's Response to care:  Patient recognizes need for rehab before returning home and is agreeable to a SNF placement. Patient reported preference for returning to the same SNF where he had been a patient , [facility].  CSW talked with the patient's sister, Elaina Hoops  On 02/08/2019 and she agreed with the recommendations of SNF but was  concerned that the patient will refuse SNF. However, she  did not want the patient to go to [facility].   CSW left Medicare.gov SNF listing at bedside with the patient to review and discuss with his family. CSW will assist with placement once patient is medically stable.   Patient/Family's Understanding of and Emotional Response to Diagnosis, Current Treatment, and Prognosis:  Patient/ and family is realistic regarding therapy needs and expressed being hopeful for SNF placement. Patient expressed understanding of CSW role and discharge process as well as medical condition. No questions/concerns about plan or treatment at this time.   Emotional Assessment Appearance:  Appears stated age Attitude/Demeanor/Rapport:  Engaged Affect (typically observed):  Appropriate, Stable Orientation:  Oriented to Self, Oriented to Place, Oriented to  Time, Oriented to Situation Alcohol / Substance use:  Not Applicable Psych involvement (Current and /or in the community):  No (Comment)  Discharge Needs  Concerns to be addressed:  Care Coordination, Basic Needs Readmission within the last 30 days:  No Current discharge risk:  Dependent with Mobility Barriers to Discharge:  Continued Medical Work up   [employer], LCSWA 02/12/2019, 3:47 PM

## 2019-02-12 NOTE — Progress Notes (Signed)
ANTICOAGULATION CONSULT NOTE - Follow Up Consult  Pharmacy Consult for Heparin Indication: atrial fibrillation  No Known Allergies  Patient Measurements: Height: 6\' 5"  (195.6 cm) Weight: 196 lb 11.2 oz (89.2 kg) IBW/kg (Calculated) : 89.1 Heparin Dosing Weight: 84 kg  Vital Signs: Temp: 97.8 F (36.6 C) (02/25 0420) Temp Source: Oral (02/25 0420) BP: 101/69 (02/25 0420) Pulse Rate: 69 (02/25 0420)  Labs: Recent Labs    02/10/19 0247 02/11/19 0408 02/12/19 0315  HGB 8.2* 7.5* 9.0*  HCT 25.1* 23.2* 28.5*  PLT 302 289 245  LABPROT 14.9 15.9* 16.1*  INR 1.18 1.28 1.3*  HEPARINUNFRC 0.42 0.42 0.45  CREATININE 1.19 1.43* 1.44*    Estimated Creatinine Clearance: 61 mL/min (A) (by C-G formula based on SCr of 1.44 mg/dL (H)).  Assessment: 34 yoM s/p aortabifem bypass on heparin infusion. Pt on warfarin PTA for hx of AFib, last dose was 2/12 which is now on hold due to ileus. Pharmacy consulted to resume heparin 2/14 with no bolus.  Heparin level remains therapeutic, hgb up today to 9 (no blood given), INR stable 1.3, holding warfarin until able to take po's.   Goal of Therapy:  Heparin level 0.3-0.5 units/ml, lower end of therapeutic range Monitor platelets by anticoagulation protocol: Yes   Plan:  -Continue heparin 1800 units/hr -Holding warfarin for now, will resume once tolerating liquids -Daily heparin level, CBC and INR  Sheppard Coil PharmD., BCPS Clinical Pharmacist 02/12/2019 8:39 AM

## 2019-02-12 NOTE — Progress Notes (Signed)
PHARMACY - ADULT TOTAL PARENTERAL NUTRITION CONSULT NOTE   Pharmacy Consult:  TPN Indication:  Prolonged ileus  Patient Measurements: Height: 6\' 5"  (195.6 cm) Weight: 196 lb 11.2 oz (89.2 kg) IBW/kg (Calculated) : 89.1 TPN AdjBW (KG): 84.4 Body mass index is 23.33 kg/m.  Assessment:  2 YOM with history of LE weakness and CVA from AFib.  He presented on 01/28/19 for aorta bifemoral bypass graft, femoral endarterectomy and iliac thrombectomy.  Patient was started on a clear liquid diet post-op but intake was inadequate.  NG tube placed on 01/31/19 and patient was made NPO again.  He was started on clears on 02/05/19 and vomitted.  Pharmacy consulted to manage TPN for nutrition support.    Patient has aphasia and reports he eats primarily soup and was eating regularly up until the day of admission.  He has minimal to no intake for ~10 days and is at risk for refeeding.  GI: persistent ileus. Prealbumin improved to 7.7, NG O/P decreased to , LBM 2/21. PPI IV, PRN Zofran Endo: no hx DM - CBGs controlled.  SSI d/c'ed 02/10/19. Lytes: low Na, Cl to 115,  others WNL Renal: SCr up 1.45, BUN WNL - UOP 2 ml/kg/hr, NS at 60/hr, net +4L decrease 2/21 CT showed left hydronephrosis Pulm: stable on RA Cards: VSS (in Afib) - amiodarone gtt, off ASA AC: Heparin for Afib - H/H up, plts wnl.  2/21 CT showed RP hematoma Hepatobil: LFTs / TG WNL, tbili up to 2.1 (no jaundice) Neuro: PVD/CVA - PRN Dilaudid ID: [email or web address], WBC down to 11.2, UCx negative - not on abx TPN Access: PICC placed 02/03/19 TPN start date: 02/06/19  Nutritional Goals (RD rec 2/17): 2200-2400 kCal, 110-125g protein, >/= 2.2L fluid per day  Current Nutrition:  TPN  Plan:  Continue TPN at goal rate 90 ml/hr, providing 121g AA, 324g CHO and 69g ILE for a total of 2275 kCal, meeting 100% of patient's needs. Electrolytes in TPN: max Na, Cl:Ac 1:2 Daily multivitamin and trace elements in TPN NS at 60 ml/hr (total fluid = 150 ml/hr  per Vascular ) F/u CMP in AM  Daylene Posey, PharmD Clinical Pharmacist Please check AMION for all [facility] Pharmacy numbers 02/12/2019 9:06 AM

## 2019-02-12 NOTE — Progress Notes (Signed)
[facility] [place] [facility]  Subjective  - feels ok no BM or flatus   Objective 101/69 69 97.8 F (36.6 C) (Oral) (!) 23 93%  Intake/Output Summary (Last 24 hours) at 02/12/2019 0933 Last data filed at 02/12/2019 0915 Gross per 24 hour  Intake 3677.66 ml  Output 4780 ml  Net -1102.34 ml   Abdomen soft incisions healing Feet warm  About 600 from NG again yesterday still green bilious  Abdomen xray today still with dilated small bowel, air throughout colon  Assessment/Planning: persistant ileus post aortobifem  Still with dilated small bowel on xray continue TPN  NG tube frequently clogged function of small diameter tube and TINY 3 way stopcock.  I removed the stop cock today and replaced with the NG included barrel connector to promote better drainage  Acute blood loss anemia Hgb 7.5 to 9.0 spontaneously continue to trend  Heparin amio for afib  Ambulate to encourage gravity as part of gut function  Hyponatremia adjust TPN    [name] [name] 02/12/2019 9:33 AM --  Laboratory Lab Results: Recent Labs    02/11/19 0408 02/12/19 0315  WBC 12.6* 11.2*  HGB 7.5* 9.0*  HCT 23.2* 28.5*  PLT 289 245   BMET Recent Labs    02/11/19 0408 02/12/19 0315  NA 130* 132*  K 4.3 4.2  CL 101 102  CO2 22 21*  GLUCOSE 118* 115*  BUN 19 22  CREATININE 1.43* 1.44*  CALCIUM 7.3* 7.5*    COAG Lab Results  Component Value Date   INR 1.3 (H) 02/12/2019   INR 1.28 02/11/2019   INR 1.18 02/10/2019   No results found for: PTT

## 2019-02-13 ENCOUNTER — Inpatient Hospital Stay (HOSPITAL_COMMUNITY): Payer: Medicare HMO

## 2019-02-13 LAB — COMPREHENSIVE METABOLIC PANEL
ALT: 58 U/L — ABNORMAL HIGH (ref 0–44)
AST: 60 U/L — ABNORMAL HIGH (ref 15–41)
Albumin: 1.6 g/dL — ABNORMAL LOW (ref 3.5–5.0)
Alkaline Phosphatase: 96 U/L (ref 38–126)
Anion gap: 8 (ref 5–15)
BILIRUBIN TOTAL: 3 mg/dL — AB (ref 0.3–1.2)
BUN: 24 mg/dL — ABNORMAL HIGH (ref 8–23)
CALCIUM: 7 mg/dL — AB (ref 8.9–10.3)
CO2: 24 mmol/L (ref 22–32)
Chloride: 101 mmol/L (ref 98–111)
Creatinine, Ser: 1.67 mg/dL — ABNORMAL HIGH (ref 0.61–1.24)
GFR calc Af Amer: 48 mL/min — ABNORMAL LOW (ref >60)
GFR, EST NON AFRICAN AMERICAN: 41 mL/min — AB (ref >60)
Glucose, Bld: 212 mg/dL — ABNORMAL HIGH (ref 70–99)
Potassium: 4.1 mmol/L (ref 3.5–5.1)
Sodium: 133 mmol/L — ABNORMAL LOW (ref 135–145)
Total Protein: 5.9 g/dL — ABNORMAL LOW (ref 6.5–8.1)

## 2019-02-13 LAB — CBC
HEMATOCRIT: 26.7 % — AB (ref 39.0–52.0)
HEMOGLOBIN: 8.9 g/dL — AB (ref 13.0–17.0)
MCH: 32 pg (ref 26.0–34.0)
MCHC: 33.3 g/dL (ref 30.0–36.0)
MCV: 96 fL (ref 80.0–100.0)
Platelets: 234 10*3/uL (ref 150–400)
RBC: 2.78 MIL/uL — ABNORMAL LOW (ref 4.22–5.81)
RDW: 16.5 % — ABNORMAL HIGH (ref 11.5–15.5)
WBC: 9.7 10*3/uL (ref 4.0–10.5)
nRBC: 0.2 % (ref 0.0–0.2)

## 2019-02-13 LAB — URINALYSIS, ROUTINE W REFLEX MICROSCOPIC
Bilirubin Urine: NEGATIVE
Glucose, UA: NEGATIVE mg/dL
Ketones, ur: NEGATIVE mg/dL
Nitrite: NEGATIVE
Protein, ur: 30 mg/dL — AB
RBC / HPF: >50 RBC/hpf — ABNORMAL HIGH (ref 0–5)
Specific Gravity, Urine: 1.013 (ref 1.005–1.030)
WBC, UA: >50 WBC/hpf — ABNORMAL HIGH (ref 0–5)
pH: 7 (ref 5.0–8.0)

## 2019-02-13 LAB — PROTIME-INR
INR: 1.3 — ABNORMAL HIGH (ref 0.8–1.2)
Prothrombin Time: 16.1 seconds — ABNORMAL HIGH (ref 11.4–15.2)

## 2019-02-13 LAB — GLUCOSE, CAPILLARY: Glucose-Capillary: 87 mg/dL (ref 70–99)

## 2019-02-13 LAB — HEPARIN LEVEL (UNFRACTIONATED): Heparin Unfractionated: 0.34 IU/mL (ref 0.30–0.70)

## 2019-02-13 MED ORDER — SODIUM CHLORIDE 0.9 % IV SOLN: INTRAVENOUS | Status: DC

## 2019-02-13 MED ORDER — VANCOMYCIN HCL 10 G IV SOLR
2000.0000 mg | Freq: Once | INTRAVENOUS | Status: AC
  Administered 2019-02-13: 2000 mg via INTRAVENOUS
  Filled 2019-02-13: qty 2000

## 2019-02-13 MED ORDER — SODIUM CHLORIDE 0.9 % IV SOLN
Freq: Once | INTRAVENOUS | Status: AC
  Administered 2019-02-13 (×2): via INTRAVENOUS

## 2019-02-13 MED ORDER — PIPERACILLIN-TAZOBACTAM 3.375 G IVPB
3.3750 g | Freq: Three times a day (TID) | INTRAVENOUS | Status: DC
  Filled 2019-02-13: qty 50

## 2019-02-13 MED ORDER — PIPERACILLIN-TAZOBACTAM 3.375 G IVPB
3.3750 g | Freq: Three times a day (TID) | INTRAVENOUS | Status: DC
  Administered 2019-02-14 – 2019-02-18 (×13): 3.375 g via INTRAVENOUS
  Filled 2019-02-13 (×14): qty 50

## 2019-02-13 MED ORDER — TRAVASOL 10 % IV SOLN
INTRAVENOUS | Status: DC
  Filled 2019-02-13: qty 1209.6

## 2019-02-13 MED ORDER — VANCOMYCIN HCL 10 G IV SOLR: 1500.0000 mg | INTRAVENOUS | Status: DC

## 2019-02-13 MED ORDER — DEXTROSE-NACL 5-0.9 % IV SOLN
INTRAVENOUS | Status: AC
  Administered 2019-02-13 – 2019-02-15 (×6): via INTRAVENOUS

## 2019-02-13 MED ORDER — PIPERACILLIN-TAZOBACTAM 3.375 G IVPB 30 MIN
3.3750 g | Freq: Once | INTRAVENOUS | Status: AC
  Administered 2019-02-13: 3.375 g via INTRAVENOUS
  Filled 2019-02-13: qty 50

## 2019-02-13 MED ORDER — ACETAMINOPHEN 650 MG RE SUPP
650.0000 mg | Freq: Once | RECTAL | Status: AC
  Administered 2019-02-13: 650 mg via RECTAL
  Filled 2019-02-13: qty 1

## 2019-02-13 NOTE — Progress Notes (Signed)
Occupational Therapy Treatment Patient Details Name: [name] [name] MRN: [id number] DOB: [birth year] Today's Date: 02/13/2019    History of present illness Pt s/p Aortobifemoral bypass on 2/10. Pt developed afib with rvr, acute renal failure and ileus 2/12-2/13. Pt had NG tube for ileus that was removed 2/17 and had to be replaced 2/19. 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    OT comments  Pt performing bed mobility with minA; minA+2 for transfers and modA +2 for taking a few steps for a squat pivot transfer from bed to recliner with hand held assist. Pt performing ADL functional mobility with increased assist today refusing RW for stability. Pt requiring x2 attempts for sit to stand to transfer today. Dizziness with BP remaining low 97/46. RN in room. Unable to tolerate additional therapy. Pt would benefit from continued OT skilled services for ADL, mobility and safety in [name] setting. Pt requires cues to stay motivated. OT to follow acutely.     Follow Up Recommendations  [name];Supervision/Assistance - 24 hour    Equipment Recommendations  Other (comment)(TBD)    Recommendations for Other Services      Precautions / Restrictions Precautions Precautions: Fall;Other (comment)(NG tube) Restrictions Weight Bearing Restrictions: No       Mobility Bed Mobility Overal bed mobility: Needs Assistance Bed Mobility: Rolling;Sidelying to Sit Rolling: Min assist Sidelying to sit: Min assist Supine to sit: Mod assist;HOB elevated     General bed mobility comments: Cues to move BLEs off of bed as trunk is elevating   Transfers Overall transfer level: Needs assistance Equipment used: Rolling walker (2 wheeled) Transfers: Sit to/from Stand Sit to Stand: Min assist;+2 safety/equipment Stand pivot transfers: Mod assist;+2 physical assistance;+2 safety/equipment       General transfer comment: Pt in pain today and unable participate well today. pt  barely taking a few steps from bed to recliner.    Balance Overall balance assessment: Needs assistance Sitting-balance support: Single extremity supported Sitting balance-Leahy Scale: Fair                                     ADL either performed or assessed with clinical judgement   ADL Overall ADL's : Needs assistance/impaired                                     Functional mobility during ADLs: Minimal assistance;+2 for physical assistance;+2 for safety/equipment;Cueing for safety;Cueing for sequencing General ADL Comments: Pt performing ADLs with assist for UB and LB requiring encouragement to perform any task.     Vision   Vision Assessment?: No apparent visual deficits   Perception     Praxis      Cognition Arousal/Alertness: Lethargic Behavior During Therapy: Flat affect Overall Cognitive Status: Difficult to assess                                 General Comments: patient following commands consistently and communicating re: pain and functional status vis minimal verbalizations and hand gestures        Exercises     Shoulder Instructions       General Comments Pt with NG tube and lots of lines and heart monitor.    Pertinent Vitals/ Pain  Pain Assessment: Faces Faces Pain Scale: Hurts even more Pain Location: abdomen Pain Descriptors / Indicators: Grimacing Pain Intervention(s): Limited activity within patient's tolerance  Home Living                                          Prior Functioning/Environment              Frequency  Min 2X/week        Progress Toward Goals  OT Goals(current goals can now be found in the care plan section)  Progress towards OT goals: Progressing toward goals  Acute Rehab OT Goals Patient Stated Goal: none stated OT Goal Formulation: With patient Time For Goal Achievement: 02/26/19 Potential to Achieve Goals: Good ADL Goals Pt Will Perform  Grooming: with modified independence;standing Pt Will Perform Lower Body Dressing: with modified independence;with adaptive equipment;sit to/from stand Pt Will Transfer to Toilet: with modified independence;bedside commode;ambulating Pt Will Perform Toileting - Clothing Manipulation and hygiene: with modified independence;sit to/from stand;sitting/lateral leans Additional ADL Goal #1: Pt will perform bed mobility with supervision in preparation for ADLs  Plan Discharge plan remains appropriate    Co-evaluation    PT/OT/SLP Co-Evaluation/Treatment: Yes Reason for Co-Treatment: For patient/therapist safety;To address functional/ADL transfers   OT goals addressed during session: Strengthening/ROM      AM-PAC OT "6 Clicks" Daily Activity     Outcome Measure   Help from another person eating meals?: Total Help from another person taking care of personal grooming?: A Lot Help from another person toileting, which includes using toliet, bedpan, or urinal?: A Lot Help from another person bathing (including washing, rinsing, drying)?: A Lot Help from another person to put on and taking off regular upper body clothing?: A Little Help from another person to put on and taking off regular lower body clothing?: Total 6 Click Score: 11    End of Session Equipment Utilized During Treatment: Gait belt  OT Visit Diagnosis: Unsteadiness on feet (R26.81);Other abnormalities of gait and mobility (R26.89);Muscle weakness (generalized) (M62.81)   Activity Tolerance Patient limited by fatigue;Patient limited by lethargy;Patient limited by pain;Treatment limited secondary to medical complications (Comment)   Patient Left in chair;with call bell/phone within reach;with nursing/sitter in room   Nurse Communication Mobility status        Time: 1141-1210 OT Time Calculation (min): 29 min  Charges: OT General Charges $OT Visit: 1 Visit OT Treatments $Neuromuscular Re-education: 8-22 mins  Cristi Loron) Glendell Docker OTR/L Acute Rehabilitation Services Pager: [phone] Office: [phone]   Sandrea Hughs 02/13/2019, 1:52 PM

## 2019-02-13 NOTE — Progress Notes (Signed)
PHARMACY - ADULT TOTAL PARENTERAL NUTRITION CONSULT NOTE   Pharmacy Consult:  TPN Indication:  Prolonged ileus  Patient Measurements: Height: 6\' 5"  (195.6 cm) Weight: 196 lb 11.2 oz (89.2 kg) IBW/kg (Calculated) : 89.1 TPN AdjBW (KG): 84.4 Body mass index is 23.33 kg/m.  Assessment:  39 YOM with history of LE weakness and CVA from AFib.  He presented on 01/28/19 for aorta bifemoral bypass graft, femoral endarterectomy and iliac thrombectomy.  Patient was started on a clear liquid diet post-op but intake was inadequate.  NG tube placed on 01/31/19 and patient was made NPO again.  He was started on clears on 02/05/19 and vomitted.  Pharmacy consulted to manage TPN for nutrition support.    Patient has aphasia and reports he eats primarily soup and was eating regularly up until the day of admission.  He has minimal to no intake for ~10 days and is at risk for refeeding.  GI: persistent ileus. Prealbumin improved to 7.7, NG O/P increased , LBM 2/21. PPI IV, PRN Zofran Repeat CT if O/P continues Endo: no hx DM - CBGs controlled.  SSI d/c'ed 02/10/19. Lytes: low Na (on max Na in TPN) slow increase, K 4.1 w/ileus Renal: SCr up 1.45>1.67, BUN 24 - UOP 2.1 ml/kg/hr, NS at 60/hr, net +2L decrease, placing foley 2/21 CT showed left hydronephrosis Pulm: stable on RA Cards: VSS (in Afib) - amiodarone gtt, off ASA AC: Heparin for Afib - H/H up, plts wnl.  2/21 CT showed RP hematoma Hepatobil: LFTs / TG WNL, tbili up to 2.1 (no jaundice) Neuro: PVD/CVA - PRN Dilaudid ID: Temp spike 102.8, WBC wnl, BCx/UCx sent.  May pull PICC, especially if Cx positive TPN Access: PICC placed 02/03/19 TPN start date: 02/06/19  Nutritional Goals (RD rec 2/17): 2200-2400 kCal, 110-125g protein, >/= 2.2L fluid per day  Current Nutrition:  TPN  Plan:  Continue TPN at goal rate 90 ml/hr, providing 121g AA, 324g CHO and 69g ILE for a total of 2275 kCal, meeting 100% of patient's needs. Electrolytes in TPN: max Na,  Cl:Ac 1:2 Daily multivitamin and trace elements in TPN NS at 60 ml/hr (total fluid = 150 ml/hr per Vascular ) F/u Thursday labs in AM F/u ID process and access plans  Daylene Posey, PharmD Clinical Pharmacist Please check AMION for all [facility] Pharmacy numbers 02/13/2019 9:36 AM

## 2019-02-13 NOTE — Progress Notes (Signed)
1707 BP 113/72 HR 106 O2 100% on room air  Spoke with Dr. Darrick Penna regarding concern for hypoglycemia when discontinuing TPN. Given verbal order to change NS infusion to D5NS infusion at 150 mL/hr. Verbal order for CBG q6h.   Pt's tremors have stopped. Resting in bed.   Leonidas Romberg, RN

## 2019-02-13 NOTE — Progress Notes (Signed)
Pharmacy Antibiotic Note  [name] [name] is a 68 y.o. male admitted on 01/28/2019 for aortobifem bypass .  He has developed fever Tm 102, wbc wnl this am. Pharmacy has been consulted for vancomycin and zosyn dosing.  Plan: Zosyn 3.375gm iv x1 now over then 3.375gm over 8ht EI Vancomycin 2gm IV x1 then 1500mg  IV q24hr AUC goal 487  Height: 6\' 5"  (195.6 cm) Weight: 196 lb 11.2 oz (89.2 kg) IBW/kg (Calculated) : 89.1  Temp (24hrs), Avg:100.8 F (38.2 C), Min:98.3 F (36.8 C), Max:102.8 F (39.3 C)  Recent Labs  Lab 02/09/19 0400 02/10/19 0247 02/11/19 0408 02/12/19 0315 02/13/19 0402 02/13/19 0535  WBC 22.4* 21.5* 12.6* 11.2* 9.7  --   CREATININE 1.30* 1.19 1.43* 1.44*  --  1.67*    Estimated Creatinine Clearance: 52.6 mL/min (A) (by C-G formula based on SCr of 1.67 mg/dL (H)).    No Known Allergies  Antimicrobials this admission: 2/26 vanc 2/26 zosyn   Dose adjustments this admission:   Microbiology results:   Leota Sauers Pharm.D. CPP, BCPS Clinical Pharmacist [phone] 02/13/2019 6:39 PM

## 2019-02-13 NOTE — Significant Event (Signed)
Rapid Response Event Note  Overview:  Called for patient with rigors Time Called: 1634 Arrival Time: 1640 Event Type: Other (Comment)  Initial Focused Assessment:  Patient alert - aphasic at baseline - able to nod yes/no - rigors present - skin hot to touch - extremities cool - follows commands - resps rapid -hyperventilating - rectal temp repeated 101.7 - HR 137 RR 34 O2 sats 97% - BP 126/86 - bil BS present - coarse Rhonchi - staff report spike temp earlier today- sent u/a and culture - U/A positive leukocytes and bacteria and heme - blood cultures done this AM.  Abd soft - non-tender - patient denies pain or SOB.  PICC line patent with TNA, and Amiodarone gtt infusing.  NGT patent for ileus - green return.     Interventions:  Tylenol rectal given - Dr. Darrick Penna updated - patient kept warm until rigors cleared - Bp remains stable.  Order to d/c PICC line and start abx.  2 new PIV's inserted left arm for gtts and maintenance.  Rigors resolved - patient nods that he feels much better - resting - RR 22 HR 106 113/72.  Repeat temp 102.6 - advised staff to start abx stat.    Plan of Care (if not transferred): Frequent VS and CBG's with d/c of TNA - to call as needed.  Will follow.   Event Summary: Name of Physician Notified: Dr. Darrick Penna at (pta RRT)    at    Outcome: Stayed in room and stabalized  Event End Time: 1745  [place]

## 2019-02-13 NOTE — Progress Notes (Signed)
ANTICOAGULATION CONSULT NOTE - Follow Up Consult  Pharmacy Consult for Heparin Indication: atrial fibrillation  No Known Allergies  Patient Measurements: Height: 6\' 5"  (195.6 cm) Weight: 196 lb 11.2 oz (89.2 kg) IBW/kg (Calculated) : 89.1 Heparin Dosing Weight: 84 kg  Vital Signs: Temp: 99.9 F (37.7 C) (02/26 0804) Temp Source: Axillary (02/26 0804) BP: 95/68 (02/26 1027) Pulse Rate: 87 (02/26 1027)  Labs: Recent Labs    02/11/19 0408 02/12/19 0315 02/13/19 0402 02/13/19 0535  HGB 7.5* 9.0* 8.9*  --   HCT 23.2* 28.5* 26.7*  --   PLT 289 245 234  --   LABPROT 15.9* 16.1* 16.1*  --   INR 1.28 1.3* 1.3*  --   HEPARINUNFRC 0.42 0.45 0.34  --   CREATININE 1.43* 1.44*  --  1.67*    Estimated Creatinine Clearance: 52.6 mL/min (A) (by C-G formula based on SCr of 1.67 mg/dL (H)).  Assessment: 17 yoM s/p aortabifem bypass on heparin infusion. Pt on warfarin PTA for hx of AFib, last dose was 2/12 which is now on hold due to ileus. Pharmacy consulted to resume heparin 2/14 with no bolus.  Heparin level remains therapeutic, hgb stable 8.9, INR stable 1.3, holding warfarin until able to take po's.   Goal of Therapy:  Heparin level 0.3-0.5 units/ml, lower end of therapeutic range Monitor platelets by anticoagulation protocol: Yes   Plan:  -Continue heparin 1800 units/hr -Holding warfarin for now, will resume once tolerating liquids -Daily heparin level, CBC and INR  Sheppard Coil PharmD., BCPS Clinical Pharmacist 02/13/2019 11:07 AM

## 2019-02-13 NOTE — Progress Notes (Signed)
[facility]  Subjective  - feels ok   Objective (!) 100/49 83 (!) 102.5 F (39.2 C) (Rectal) (!) 26 92%  Intake/Output Summary (Last 24 hours) at 02/13/2019 0746 Last data filed at 02/13/2019 4536 Gross per 24 hour  Intake 3698.69 ml  Output 4650 ml  Net -951.31 ml   Abdomen soft Incisions healing Feet pink warm  Condom catheter off foley catheter removed pt does not know the last time he voided NG green bilious output difficult to know real output with sketchy recording  Assessment/Planning: Acute renal failure secondary to ileus.  Difficult to know his I/O status due to nebulous I/O status.  Will bolus 2 liter normal saline now  Will place foley catheter again.  Do not remove without MD order.  This is not typical post op patient with typical foley protocol.  He has now gone into renal failure on 3 separate occasions due to nebulous fluid status  Febrile last evening unknown source no leukocytosis.  Cultures pending will have low threshold to remove PICC as possible source.  Persistant ileus still with bilious NG output.  If does not resolve over next 24 hours will repeat CT scan.  Aortobifem patent incisions essentially healed at this point. Ambulate as much as possible.  Severe protein calorie malnutrition continue TPN  Afib rate controlled on heparin    [name] 02/13/2019 7:46 AM --  Laboratory Lab Results: Recent Labs    02/12/19 0315 02/13/19 0402  WBC 11.2* 9.7  HGB 9.0* 8.9*  HCT 28.5* 26.7*  PLT 245 234   BMET Recent Labs    02/12/19 0315 02/13/19 0535  NA 132* 133*  K 4.2 4.1  CL 102 101  CO2 21* 24  GLUCOSE 115* 212*  BUN 22 24*  CREATININE 1.44* 1.67*  CALCIUM 7.5* 7.0*    COAG Lab Results  Component Value Date   INR 1.3 (H) 02/13/2019   INR 1.3 (H) 02/12/2019   INR 1.28 02/11/2019   No results found for: PTT

## 2019-02-13 NOTE — Progress Notes (Addendum)
[name] [name] arrived to room and pt shivering all over. Presented similarly last night when pt spiked a fever. [name] Formosa [name], [name] Parody [name], and Product manager in room to assess pt. Pt alert and oriented.   1634 Rectal temperature 99.7. BP 125/109 (question accuracy due to pt's shivering). HR 116. RR 28, tachypnic. O2 90% on room air.   CBG 87   HR 90s most of day - now elevated as high as 134, irregular, remains on IV amio at 16.73mL/hr.   1634 RRT called 1639 Dr. Darrick Penna paged  [phone] Rectal temp 101.7. Tylenol 650 mg suppository given. [name] [name] RRT at bedside.   [id number] Spoke with Dr. Darrick Penna. Updated of urine analysis results.  Verbal order pull PICC and culture the tip of the PICC line. Verbal order to discontinue TPN.  Will speak with pharmacy regarding TPN discontinuation to prevent hypoglycemia.   Verbal order for IVF at  150 cc/hr. Verbal order to continue IV amiodarone at 16.7 mL/hr - per Dr. Darrick Penna if tachycardia continues speak with cardiology.   [name] Romberg, [name]

## 2019-02-13 NOTE — Progress Notes (Signed)
0530: rectal temperature recheck was 102.5 degree F.  0545: DR. Myra Gianotti paged.  70: DR. Myra Gianotti called back, notified MD of pt's current situation. Received order for blood cultures x2, UA, urine culture, 1 view chest x-ray and one time dose of tylenol 650mg  rectal dose.  Pt currently resting in bed, stated he is feeling better. One time dose of rectal tylenol administered at 0625. Will report to oncoming shift. Will continue to monitor.

## 2019-02-13 NOTE — Progress Notes (Signed)
Physical Therapy Treatment Patient Details Name: [name] [name] MRN: [id number] DOB: 03-16-1951 Today's Date: 02/13/2019    History of Present Illness Pt s/p Aortobifemoral bypass on 2/10. Pt developed afib with rvr, acute renal failure and ileus 2/12-2/13. Pt had NG tube for ileus that was removed 2/17 and had to be replaced 2/19. 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     PT Comments    Patient seen for mobility progression. Pt is fatigued today but agreeable to therapy. Pt requires +2 for functional transfer training and with c/o dizziness in standing. Gait training deferred due to pt fatigue and NGT to suction. Continue to progress as tolerated with anticipated d/c to SNF for further skilled PT services.    Follow Up Recommendations  SNF;Supervision/Assistance - 24 hour     Equipment Recommendations  Other (comment)(TBD next venue)    Recommendations for Other Services       Precautions / Restrictions Precautions Precautions: Fall;Other (comment)(NG tube ) Restrictions Weight Bearing Restrictions: No    Mobility  Bed Mobility Overal bed mobility: Needs Assistance Bed Mobility: Rolling;Sidelying to Sit Rolling: Min assist Sidelying to sit: Min assist Supine to sit: Mod assist;HOB elevated     General bed mobility comments: cues for sequencing; assist to elevate trunk into sitting  Transfers Overall transfer level: Needs assistance Equipment used: Rolling walker (2 wheeled) Transfers: Sit to/from Stand Sit to Stand: Min assist;+2 safety/equipment Stand pivot transfers: Mod assist;+2 physical assistance;+2 safety/equipment       General transfer comment: assistance required to power up into standing and then for balance and mobilizing R LE for stand pivot transfer; pt stood X 2 trials from EOB and one first trial attempted pre gait activities however pt unable to march in place in part by very wide BOS  Ambulation/Gait              General Gait Details: deferred due to pt fatigue and NGT to suction   Stairs             Wheelchair Mobility    Modified Rankin (Stroke Patients Only)       Balance Overall balance assessment: Needs assistance Sitting-balance support: Single extremity supported Sitting balance-Leahy Scale: Fair     Standing balance support: Bilateral upper extremity supported Standing balance-Leahy Scale: Poor Standing balance comment: pt able to correct upright posture in standing with multimodal cues but fatigues quickly                            Cognition Arousal/Alertness: Lethargic Behavior During Therapy: Flat affect Overall Cognitive Status: Difficult to assess                                 General Comments: patient following commands consistently and communicating re: pain and functional status vis minimal verbalizations and hand gestures      Exercises      General Comments General comments (skin integrity, edema, etc.): Pt with NG tube and lots of lines and heart monitor.      Pertinent Vitals/Pain Pain Assessment: Faces Faces Pain Scale: Hurts even more Pain Location: abdomen Pain Descriptors / Indicators: Grimacing Pain Intervention(s): Limited activity within patient's tolerance;Monitored during session;Repositioned    Home Living  Prior Function            PT Goals (current goals can now be found in the care plan section) Acute Rehab PT Goals Patient Stated Goal: none stated Progress towards PT goals: Progressing toward goals    Frequency    Min 3X/week      PT Plan Current plan remains appropriate;Frequency needs to be updated    Co-evaluation PT/OT/SLP Co-Evaluation/Treatment: Yes Reason for Co-Treatment: For patient/therapist safety;To address functional/ADL transfers PT goals addressed during session: Mobility/safety with mobility;Balance;Strengthening/ROM OT goals  addressed during session: Strengthening/ROM      AM-PAC PT "6 Clicks" Mobility   Outcome Measure  Help needed turning from your back to your side while in a flat bed without using bedrails?: A Lot Help needed moving from lying on your back to sitting on the side of a flat bed without using bedrails?: A Lot Help needed moving to and from a bed to a chair (including a wheelchair)?: A Lot Help needed standing up from a chair using your arms (e.g., wheelchair or bedside chair)?: A Lot Help needed to walk in hospital room?: A Lot Help needed climbing 3-5 steps with a railing? : Total 6 Click Score: 11    End of Session Equipment Utilized During Treatment: Gait belt Activity Tolerance: Patient limited by fatigue Patient left: in chair;with call bell/phone within reach Nurse Communication: Mobility status PT Visit Diagnosis: Other abnormalities of gait and mobility (R26.89);Muscle weakness (generalized) (M62.81) Pain - part of body: (abdomen)     Time: 7035-0093 PT Time Calculation (min) (ACUTE ONLY): 24 min  Charges:  $Gait Training: 8-22 mins                     Erline Levine, PTA Acute Rehabilitation Services Pager: [phone] Office: [phone]     Carolynne Edouard 02/13/2019, 3:11 PM

## 2019-02-13 NOTE — Progress Notes (Signed)
0345: pt was shivering in the room, stated he is very cold. Checked rectal temp, it was 102.8 degree F. 650 prn rectal tylenol administered. Stayed with pt in the room. Pt started to feel little better and stopped shivering. Call bell within reach. Will follow up on temperature.

## 2019-02-14 LAB — CBC
HCT: 23 % — ABNORMAL LOW (ref 39.0–52.0)
Hemoglobin: 7.4 g/dL — ABNORMAL LOW (ref 13.0–17.0)
MCH: 29.8 pg (ref 26.0–34.0)
MCHC: 32.2 g/dL (ref 30.0–36.0)
MCV: 92.7 fL (ref 80.0–100.0)
Platelets: 174 10*3/uL (ref 150–400)
RBC: 2.48 MIL/uL — ABNORMAL LOW (ref 4.22–5.81)
RDW: 16.1 % — ABNORMAL HIGH (ref 11.5–15.5)
WBC: 9.2 10*3/uL (ref 4.0–10.5)
nRBC: 0 % (ref 0.0–0.2)

## 2019-02-14 LAB — BLOOD CULTURE ID PANEL (REFLEXED)
Acinetobacter baumannii: NOT DETECTED
Candida albicans: NOT DETECTED
Candida glabrata: NOT DETECTED
Candida krusei: NOT DETECTED
Candida parapsilosis: NOT DETECTED
Candida tropicalis: NOT DETECTED
Carbapenem resistance: NOT DETECTED
Enterobacter cloacae complex: NOT DETECTED
Enterobacteriaceae species: DETECTED — AB
Enterococcus species: NOT DETECTED
Escherichia coli: NOT DETECTED
Haemophilus influenzae: NOT DETECTED
KLEBSIELLA PNEUMONIAE: NOT DETECTED
Klebsiella oxytoca: NOT DETECTED
Listeria monocytogenes: NOT DETECTED
Neisseria meningitidis: NOT DETECTED
Proteus species: NOT DETECTED
Pseudomonas aeruginosa: NOT DETECTED
Serratia marcescens: NOT DETECTED
Staphylococcus aureus (BCID): NOT DETECTED
Staphylococcus species: NOT DETECTED
Streptococcus agalactiae: NOT DETECTED
Streptococcus pneumoniae: NOT DETECTED
Streptococcus pyogenes: NOT DETECTED
Streptococcus species: NOT DETECTED

## 2019-02-14 LAB — MAGNESIUM: Magnesium: 2.1 mg/dL (ref 1.7–2.4)

## 2019-02-14 LAB — COMPREHENSIVE METABOLIC PANEL
ALK PHOS: 79 U/L (ref 38–126)
ALT: 52 U/L — ABNORMAL HIGH (ref 0–44)
AST: 51 U/L — ABNORMAL HIGH (ref 15–41)
Albumin: 1.6 g/dL — ABNORMAL LOW (ref 3.5–5.0)
Anion gap: 6 (ref 5–15)
BUN: 20 mg/dL (ref 8–23)
CO2: 21 mmol/L — ABNORMAL LOW (ref 22–32)
Calcium: 6.9 mg/dL — ABNORMAL LOW (ref 8.9–10.3)
Chloride: 109 mmol/L (ref 98–111)
Creatinine, Ser: 1.51 mg/dL — ABNORMAL HIGH (ref 0.61–1.24)
GFR calc Af Amer: 54 mL/min — ABNORMAL LOW (ref >60)
GFR, EST NON AFRICAN AMERICAN: 46 mL/min — AB (ref >60)
Glucose, Bld: 118 mg/dL — ABNORMAL HIGH (ref 70–99)
Potassium: 3.9 mmol/L (ref 3.5–5.1)
Sodium: 136 mmol/L (ref 135–145)
Total Bilirubin: 2.3 mg/dL — ABNORMAL HIGH (ref 0.3–1.2)
Total Protein: 5.3 g/dL — ABNORMAL LOW (ref 6.5–8.1)

## 2019-02-14 LAB — GLUCOSE, CAPILLARY
GLUCOSE-CAPILLARY: 112 mg/dL — AB (ref 70–99)
Glucose-Capillary: 112 mg/dL — ABNORMAL HIGH (ref 70–99)
Glucose-Capillary: 105 mg/dL — ABNORMAL HIGH (ref 70–99)
Glucose-Capillary: 112 mg/dL — ABNORMAL HIGH (ref 70–99)

## 2019-02-14 LAB — BLOOD GAS, ARTERIAL
Acid-base deficit: 3 mmol/L — ABNORMAL HIGH (ref 0.0–2.0)
Bicarbonate: 21.8 mmol/L (ref 20.0–28.0)
FIO2: 21
O2 Saturation: 95.5 %
Patient temperature: 98.6
pCO2 arterial: 40.9 mmHg (ref 32.0–48.0)
pH, Arterial: 7.346 — ABNORMAL LOW (ref 7.350–7.450)
pO2, Arterial: 79.9 mmHg — ABNORMAL LOW (ref 83.0–108.0)

## 2019-02-14 LAB — HEPARIN LEVEL (UNFRACTIONATED): Heparin Unfractionated: 0.65 IU/mL (ref 0.30–0.70)

## 2019-02-14 LAB — PHOSPHORUS: Phosphorus: 3.3 mg/dL (ref 2.5–4.6)

## 2019-02-14 NOTE — Progress Notes (Signed)
ANTICOAGULATION CONSULT NOTE - Follow Up Consult  Pharmacy Consult for Heparin Indication: atrial fibrillation  No Known Allergies  Patient Measurements: Height: 6\' 5"  (195.6 cm) Weight: 196 lb 11.2 oz (89.2 kg) IBW/kg (Calculated) : 89.1 Heparin Dosing Weight: 84 kg  Vital Signs: Temp: 98 F (36.7 C) (02/27 0726) Temp Source: Axillary (02/27 0726) BP: 96/63 (02/27 0726) Pulse Rate: 81 (02/27 0726)  Labs: Recent Labs    02/12/19 0315 02/13/19 0402 02/13/19 0535 02/14/19 0249  HGB 9.0* 8.9*  --  7.4*  HCT 28.5* 26.7*  --  23.0*  PLT 245 234  --  174  LABPROT 16.1* 16.1*  --   --   INR 1.3* 1.3*  --   --   HEPARINUNFRC 0.45 0.34  --  0.65  CREATININE 1.44*  --  1.67* 1.51*    Estimated Creatinine Clearance: 58.2 mL/min (A) (by C-G formula based on SCr of 1.51 mg/dL (H)).  Assessment: 63 yoM s/p aortabifem bypass on heparin infusion. Pt on warfarin PTA for hx of AFib, last dose was 2/12 which is now on hold due to ileus. Pharmacy consulted to resume heparin 2/14 with no bolus.  Heparin level above lower goal, will adjust. Continue holding warfarin until able to take po's.   Goal of Therapy:  Heparin level 0.3-0.5 units/ml, lower end of therapeutic range Monitor platelets by anticoagulation protocol: Yes   Plan:  -Reduce heparin to 1650 units/hr -Holding warfarin for now, will resume once tolerating liquids -Daily heparin level, CBC and INR  Sheppard Coil PharmD., BCPS Clinical Pharmacist 02/14/2019 8:55 AM

## 2019-02-14 NOTE — Progress Notes (Signed)
Blood culture enterobacter Urine culture gm neg rods Will stop Vanc Keep Zosyn  Will place central line in cath lab tomorrow after 48 hours catheter holiday  Need to keep foley catheter until renal function is stable.  Do not remove.  Consent for central line placement  Charles Fields, MD [facility]  Office: [phone] Pager: [phone]  

## 2019-02-14 NOTE — Progress Notes (Addendum)
  Progress Note    02/14/2019 7:34 AM 17 Days Post-Op  Subjective:  Fever overnight.  PICC removed.  Now afebrile.  No complaints this morning   Vitals:   02/14/19 0500 02/14/19 0726  BP: 91/70 96/63  Pulse: 81 81  Resp: 20 (!) 22  Temp: 98.6 F (37 C) 98 F (36.7 C)  SpO2: 96% 98%   Physical Exam: Lungs:  Non labored Incisions:  abd and groin incisions nearly healed Extremities:  Palpable DP pulses bilaterally Abdomen:  Normoactive bowel sounds, not distended, mild tenderness diffusely Neurologic: baseline  CBC    Component Value Date/Time   WBC 9.2 02/14/2019 0249   RBC 2.48 (L) 02/14/2019 0249   HGB 7.4 (L) 02/14/2019 0249   HGB 15.7 09/27/2017 0930   HCT 23.0 (L) 02/14/2019 0249   HCT 46.8 09/27/2017 0930   PLT 174 02/14/2019 0249   PLT 275 09/27/2017 0930   MCV 92.7 02/14/2019 0249   MCV 94 09/27/2017 0930   MCH 29.8 02/14/2019 0249   MCHC 32.2 02/14/2019 0249   RDW 16.1 (H) 02/14/2019 0249   RDW 14.8 09/27/2017 0930   LYMPHSABS 0.8 02/11/2019 0408   MONOABS 0.6 02/11/2019 0408   EOSABS 0.1 02/11/2019 0408   BASOSABS 0.0 02/11/2019 0408    BMET    Component Value Date/Time   NA 136 02/14/2019 0249   NA 140 09/27/2017 0930   K 3.9 02/14/2019 0249   CL 109 02/14/2019 0249   CO2 21 (L) 02/14/2019 0249   GLUCOSE 118 (H) 02/14/2019 0249   BUN 20 02/14/2019 0249   BUN 14 09/27/2017 0930   CREATININE 1.51 (H) 02/14/2019 0249   CALCIUM 6.9 (L) 02/14/2019 0249   GFRNONAA 46 (L) 02/14/2019 0249   GFRAA 54 (L) 02/14/2019 0249    INR    Component Value Date/Time   INR 1.3 (H) 02/13/2019 0402     Intake/Output Summary (Last 24 hours) at 02/14/2019 0734 Last data filed at 02/14/2019 4492 Gross per 24 hour  Intake 6087.76 ml  Output 2990 ml  Net 3097.76 ml     Assessment/Plan:  68 y.o. male is s/p  17 Days Post-Op    PV: palpable DP pulses bilaterally ID: Febrile overnight; continue vanc/zosyn, cultures pending, PICC removed, now afebrile, no  white count GI: 82ml NG output overnight; TPN; Dr. Darrick Penna considering repeat CT AKI: urine output overnight; continue foley; daily BMP Heart: heparin and amio IV for atrial fibrillation   Emilie Rutter, PA-C [facility] [phone] 02/14/2019 7:34 AM  Clinically looks about the same still no real bowel function, bilious NG output Will need central line tomorrow will place in cath lab Continue IV antibiotics for now until cultures come back Ileus continue IV f bowel rest Renal failure creatinine sl improved Continue strict I and o  [name], MD [facility] [place] Office: [phone] Pager: [phone]

## 2019-02-14 NOTE — Progress Notes (Signed)
PHARMACY - PHYSICIAN COMMUNICATION CRITICAL VALUE ALERT - BLOOD CULTURE IDENTIFICATION (BCID)  [name] [name] is an 67 y.o. male who presented to [facility] on 01/28/2019 with a chief complaint of s/p vascular surgery   Assessment:  Recent fever spike, BCID with gram negative rods but unable to identify an organism  Name of physician (or Provider) Contacted: Dr. Chestine Spore  Current antibiotics: Vancomycin/Zosyn  Changes to prescribed antibiotics recommended:  No changes for now  Results for orders placed or performed during the hospital encounter of 01/28/19  Blood Culture ID Panel (Reflexed) (Collected: 02/13/2019  6:10 AM)  Result Value Ref Range   Enterococcus species NOT DETECTED NOT DETECTED   Listeria monocytogenes NOT DETECTED NOT DETECTED   Staphylococcus species NOT DETECTED NOT DETECTED   Staphylococcus aureus (BCID) NOT DETECTED NOT DETECTED   Streptococcus species NOT DETECTED NOT DETECTED   Streptococcus agalactiae NOT DETECTED NOT DETECTED   Streptococcus pneumoniae NOT DETECTED NOT DETECTED   Streptococcus pyogenes NOT DETECTED NOT DETECTED   Acinetobacter baumannii NOT DETECTED NOT DETECTED   Enterobacteriaceae species DETECTED (A) NOT DETECTED   Enterobacter cloacae complex NOT DETECTED NOT DETECTED   Escherichia coli NOT DETECTED NOT DETECTED   Klebsiella oxytoca NOT DETECTED NOT DETECTED   Klebsiella pneumoniae NOT DETECTED NOT DETECTED   Proteus species NOT DETECTED NOT DETECTED   Serratia marcescens NOT DETECTED NOT DETECTED   Carbapenem resistance NOT DETECTED NOT DETECTED   Haemophilus influenzae NOT DETECTED NOT DETECTED   Neisseria meningitidis NOT DETECTED NOT DETECTED   Pseudomonas aeruginosa NOT DETECTED NOT DETECTED   Candida albicans NOT DETECTED NOT DETECTED   Candida glabrata NOT DETECTED NOT DETECTED   Candida krusei NOT DETECTED NOT DETECTED   Candida parapsilosis NOT DETECTED NOT DETECTED   Candida tropicalis NOT DETECTED NOT DETECTED     Abran Duke 02/14/2019  7:12 AM

## 2019-02-14 NOTE — Progress Notes (Signed)
Nutrition Follow Up  DOCUMENTATION CODES:   Non-severe (moderate) malnutrition in context of acute illness/injury  INTERVENTION:    TPN per pharmacy  NUTRITION DIAGNOSIS:   Moderate Malnutrition related to acute illness (ileus) as evidenced by energy intake < or equal to 50% for > or equal to 5 days, mild muscle depletion, mild fat depletion, ongoing  GOAL:   Patient will meet greater than or equal to 90% of their needs, met  MONITOR:   Diet advancement, Supplement acceptance, I & O's, Skin, Weight trends, Labs  ASSESSMENT:   68 year old male who presented on 2/10 for aortobifemoral bypass with bilateral common femoral endarterectomy and profundoplasty, bilateral iliac thrombectomy. PMH significant for atrial fibrillation, tachybrady syndrome, prior CVA, CKD, and PVD.  2/12 clear liquids 2/13 transferred to ICU, NPO, NGT placed d/t post-op ileus  2/18 pharmacy consulted for TPN, not started 2/18 NGT removed, advanced to clear liquids 2/20 TPN started  S/p rapid response event last night. Pt remains NPO; NGT remains to LIS. Tip of PICC line cultured; now on line holiday.  TPN previously at goal rate of 90 ml/hr. Provides 2275 kcals and 121 gm protein. Meets 100% of estimated nutritional needs.  Labs and medications reviewed. CBG's [id number].  Diet Order:   Diet Order            Diet NPO time specified  Diet effective now             EDUCATION NEEDS:   No education needs have been identified at this time  Skin:  Skin Assessment: Skin Integrity Issues: Incisions: closed incisions to R groin, L groin, abdomen  Last BM:  2/21  Height:   Ht Readings from Last 1 Encounters:  01/28/19 '6\' 5"'  (1.956 m)   Weight:   Wt Readings from Last 1 Encounters:  02/06/19 89.2 kg   Ideal Body Weight:  94.5 kg  BMI:  Body mass index is 23.33 kg/m.  Estimated Nutritional Needs:   Kcal:  2200-2400  Protein:  110-125 gm  Fluid:  >/= 2.2 L  Arthur Holms,  RD, LDN Pager #: [phone] After-Hours Pager #: [phone]

## 2019-02-14 NOTE — H&P (View-Only) (Signed)
Blood culture enterobacter Urine culture gm neg rods Will stop Vanc Keep Zosyn  Will place central line in cath lab tomorrow after 48 hours catheter holiday  Need to keep foley catheter until renal function is stable.  Do not remove.  Consent for central line placement  [name], MD [facility] [place] Office: [phone] Pager: [phone]

## 2019-02-15 ENCOUNTER — Encounter (HOSPITAL_COMMUNITY): Payer: Self-pay | Admitting: Vascular Surgery

## 2019-02-15 ENCOUNTER — Inpatient Hospital Stay (HOSPITAL_COMMUNITY): Payer: Medicare HMO

## 2019-02-15 ENCOUNTER — Encounter (HOSPITAL_COMMUNITY)
Admission: RE | Disposition: A | Discharge status: Home Health | Payer: Self-pay | Source: Home / Self Care | Attending: Vascular Surgery

## 2019-02-15 DIAGNOSIS — K567 Ileus, unspecified: Secondary | ICD-10-CM

## 2019-02-15 HISTORY — PX: DIALYSIS/PERMA CATHETER INSERTION: CATH118288

## 2019-02-15 LAB — GLUCOSE, CAPILLARY
Glucose-Capillary: 108 mg/dL — ABNORMAL HIGH (ref 70–99)
Glucose-Capillary: 108 mg/dL — ABNORMAL HIGH (ref 70–99)
Glucose-Capillary: 111 mg/dL — ABNORMAL HIGH (ref 70–99)
Glucose-Capillary: 112 mg/dL — ABNORMAL HIGH (ref 70–99)
Glucose-Capillary: 120 mg/dL — ABNORMAL HIGH (ref 70–99)
Glucose-Capillary: 92 mg/dL (ref 70–99)

## 2019-02-15 LAB — CBC
HCT: 24.3 % — ABNORMAL LOW (ref 39.0–52.0)
Hemoglobin: 7.6 g/dL — ABNORMAL LOW (ref 13.0–17.0)
MCH: 29.2 pg (ref 26.0–34.0)
MCHC: 31.3 g/dL (ref 30.0–36.0)
MCV: 93.5 fL (ref 80.0–100.0)
Platelets: 190 10*3/uL (ref 150–400)
RBC: 2.6 MIL/uL — ABNORMAL LOW (ref 4.22–5.81)
RDW: 16.3 % — ABNORMAL HIGH (ref 11.5–15.5)
WBC: 7.6 10*3/uL (ref 4.0–10.5)
nRBC: 0 % (ref 0.0–0.2)

## 2019-02-15 LAB — BASIC METABOLIC PANEL
Anion gap: 3 — ABNORMAL LOW (ref 5–15)
BUN: 14 mg/dL (ref 8–23)
CO2: 21 mmol/L — ABNORMAL LOW (ref 22–32)
Calcium: 7 mg/dL — ABNORMAL LOW (ref 8.9–10.3)
Chloride: 113 mmol/L — ABNORMAL HIGH (ref 98–111)
Creatinine, Ser: 1.41 mg/dL — ABNORMAL HIGH (ref 0.61–1.24)
GFR calc Af Amer: 58 mL/min — ABNORMAL LOW (ref >60)
GFR calc non Af Amer: 50 mL/min — ABNORMAL LOW (ref >60)
Glucose, Bld: 120 mg/dL — ABNORMAL HIGH (ref 70–99)
Potassium: 3.7 mmol/L (ref 3.5–5.1)
Sodium: 137 mmol/L (ref 135–145)

## 2019-02-15 LAB — CATH TIP CULTURE

## 2019-02-15 LAB — URINE CULTURE: Culture: >=100000 — AB

## 2019-02-15 LAB — CULTURE, BLOOD (ROUTINE X 2): Special Requests: ADEQUATE

## 2019-02-15 LAB — HEPARIN LEVEL (UNFRACTIONATED): Heparin Unfractionated: 0.51 IU/mL (ref 0.30–0.70)

## 2019-02-15 SURGERY — DIALYSIS/PERMA CATHETER INSERTION
Anesthesia: LOCAL

## 2019-02-15 MED ORDER — HEPARIN (PORCINE) IN NACL 1000-0.9 UT/500ML-% IV SOLN
INTRAVENOUS | Status: AC
  Filled 2019-02-15: qty 500

## 2019-02-15 MED ORDER — HEPARIN (PORCINE) IN NACL 1000-0.9 UT/500ML-% IV SOLN
INTRAVENOUS | Status: DC | PRN
  Administered 2019-02-15: 500 mL

## 2019-02-15 MED ORDER — TRAVASOL 10 % IV SOLN
INTRAVENOUS | Status: AC
  Administered 2019-02-15: 18:00:00 via INTRAVENOUS
  Filled 2019-02-15: qty 1209.6

## 2019-02-15 MED ORDER — LIDOCAINE HCL (PF) 1 % IJ SOLN
INTRAMUSCULAR | Status: DC | PRN
  Administered 2019-02-15: 2 mL via INTRADERMAL

## 2019-02-15 MED ORDER — LIDOCAINE HCL (PF) 1 % IJ SOLN
INTRAMUSCULAR | Status: AC
  Filled 2019-02-15: qty 30

## 2019-02-15 MED ORDER — IOPAMIDOL (ISOVUE-370) INJECTION 76%
80.0000 mL | Freq: Once | INTRAVENOUS | Status: AC | PRN
  Administered 2019-02-15: 80 mL via INTRAVENOUS

## 2019-02-15 MED ORDER — POTASSIUM CHLORIDE 10 MEQ/50ML IV SOLN
10.0000 meq | INTRAVENOUS | Status: AC
  Administered 2019-02-15 (×2): 10 meq via INTRAVENOUS
  Filled 2019-02-15 (×2): qty 50

## 2019-02-15 MED ORDER — DEXTROSE-NACL 5-0.9 % IV SOLN
INTRAVENOUS | Status: DC
  Administered 2019-02-15 – 2019-02-26 (×8): via INTRAVENOUS

## 2019-02-15 SURGICAL SUPPLY — 6 items
BAG SNAP BAND KOVER 36X36 (MISCELLANEOUS) ×2 IMPLANT
PROTECTION STATION PRESSURIZED (MISCELLANEOUS) ×2
SHEATH PROBE COVER 6X72 (BAG) ×2 IMPLANT
STATION PROTECTION PRESSURIZED (MISCELLANEOUS) ×1 IMPLANT
TRAY CATH 3LUMEN 20C SULFAFREE (CATHETERS) ×2 IMPLANT
TRAY PV CATH (CUSTOM PROCEDURE TRAY) ×2 IMPLANT

## 2019-02-15 NOTE — Interval H&P Note (Signed)
History and Physical Interval Note:  02/15/2019 7:37 AM  [name] [name]  has presented today for surgery, with the diagnosis of End stage renal  The various methods of treatment have been discussed with the patient and family. After consideration of risks, benefits and other options for treatment, the patient has consented to  Procedure(s): DIALYSIS/PERMA CATHETER INSERTION (N/A) as a surgical intervention .  The patient's history has been reviewed, patient examined, no change in status, stable for surgery.  I have reviewed the patient's chart and labs.  Questions were answered to the patient's satisfaction.     [name]

## 2019-02-15 NOTE — Progress Notes (Signed)
PHARMACY - ADULT TOTAL PARENTERAL NUTRITION CONSULT NOTE   Pharmacy Consult:  TPN Indication:  Prolonged ileus  Patient Measurements: Height: 6\' 5"  (195.6 cm) Weight: 196 lb 11.2 oz (89.2 kg) IBW/kg (Calculated) : 89.1 TPN AdjBW (KG): 84.4 Body mass index is 23.33 kg/m.  Assessment:  42 YOM with history of LE weakness and CVA from AFib.  He presented on 01/28/19 for aorta bifemoral bypass graft, femoral endarterectomy and iliac thrombectomy.  Patient was started on a clear liquid diet post-op but intake was inadequate.  NG tube placed on 01/31/19 and patient was made NPO again.  He was started on clears on 02/05/19 and vomitted.  Pharmacy consulted to manage TPN for nutrition support.    Patient has aphasia and reports he eats primarily soup and was eating regularly up until the day of admission.  He has minimal to no intake for ~10 days and is at risk for refeeding.  GI: persistent ileus. Prealbumin improved to 7.7, NG O/P , LBM 2/21. Last abdominal Xray on 2/25 showed improvement in ileus. PPI IV, PRN Zofran. Repeat CT if O/P continues Endo: no hx DM - CBGs controlled.  Lytes: wnl exc K low at 3.7 (goal > 4 in ileus), Cl 113, HCO3 21.  Renal: SCr up 1.45>1.67, BUN 24 - UOP 2.1 ml/kg/hr, NS at 60/hr, net +2L decrease, placing foley 2/21 CT showed left hydronephrosis Pulm: stable on RA Cards: VSS (in Afib) - amiodarone gtt, off ASA AC: Heparin for Afib - H/H up, plts wnl.  2/21 CT showed RP hematoma Hepatobil: LFTs / TG WNL, tbili up to 2.1 (no jaundice) Neuro: PVD/CVA - PRN Dilaudid ID: Enterobacter bacteremia. Now afebrile, WBC wnl. PICC pulled and now planning to place new one on 2/28.  TPN Access: New CVC triple lumen placed on 2/28 TPN start date: 02/06/19  Nutritional Goals (RD rec 2/27): 2200-2400 kCal, 110-125g protein, >/= 2.2L fluid per day  Current Nutrition: NPO  TPN  Plan:  New PICC placed today  Restart TPN tonight at goal rate of 37mL/hr This TPN provides  121g AA, 324g CHO and 69g ILE for a total of 2,275 kCal, meeting 100% of patient's needs. Electrolytes in TPN: max Na, Cl:Ac 1:2 Daily multivitamin and trace elements in TPN Decrease D5NS to 40mL/hr tonight when TPN starts Monitor TPN labs  Give KCl IV run x 2 today  [name] [name], PharmD, BCPS, [facility] Clinical Pharmacist Phone number [phone] 02/15/2019 8:09 AM

## 2019-02-15 NOTE — Progress Notes (Signed)
[facility] [place]  Subjective  - feels the same   Objective 106/68 (!) 59 97.9 F (36.6 C) (Oral) (!) 25 99%  Intake/Output Summary (Last 24 hours) at 02/15/2019 0737 Last data filed at 02/15/2019 0503 Gross per 24 hour  Intake 4821.48 ml  Output 1950 ml  Net 2871.48 ml   Abdomen soft incisions healing No flatus or BM NG output 100 green bilious  Assessment/Planning: Prolonged ileus persists will repeat CT today with enteric contrast Renal failure stable creat 1.4 adequate urine output  Urine culture e coli on zosyn Blood culture enterobacter Cath tip culture gm neg rods  New right IJ central line today  Has been afebrile 48 hr  Acute blood loss anemia stable  Afib amio heparin  [name] 02/15/2019 7:37 AM --  Laboratory Lab Results: Recent Labs    02/14/19 0249 02/15/19 0339  WBC 9.2 7.6  HGB 7.4* 7.6*  HCT 23.0* 24.3*  PLT 174 190   BMET Recent Labs    02/14/19 0249 02/15/19 0339  NA 136 137  K 3.9 3.7  CL 109 113*  CO2 21* 21*  GLUCOSE 118* 120*  BUN 20 14  CREATININE 1.51* 1.41*  CALCIUM 6.9* 7.0*    COAG Lab Results  Component Value Date   INR 1.3 (H) 02/13/2019   INR 1.3 (H) 02/12/2019   INR 1.28 02/11/2019   No results found for: PTT

## 2019-02-15 NOTE — Progress Notes (Signed)
Nutrition Consult/Brief Note  RD consulted for new TPN/TNA.  Nutrition follow up completed 2/27.   TPN discontinued 2/26 due to line infection.  Pt s/p new IJ catheter this AM per Dr. Darrick Penna.   TPN to restart tonight at goal rate of 90 ml/hr. Provides 2275 kcals and 121 gm protein. Meets 100% of estimated nutritional needs.  Maureen Chatters, RD, LDN Pager #: [phone] After-Hours Pager #: [phone]

## 2019-02-15 NOTE — Progress Notes (Signed)
Occupational Therapy Treatment Patient Details Name: [name] [name] MRN: [id number] DOB: July 04, 1951 Today's Date: 02/15/2019    History of present illness Pt s/p Aortobifemoral bypass on 2/10. Received central line 2/28. Pt developed afib with rvr, acute renal failure and ileus 2/12-2/13. Pt had NG tube for ileus that was removed 2/17 and had to be replaced 2/19. 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    OT comments  Pt performing bed mobility with minA for trunk elevation and maneuvering RLE to EOB. Pt sit to stand with minA+2 and use of RW for mobility in room up to 20' with cueing for upright posture. Dizziness noted with positional change and BP taken. See below. Pt requires motivational cueing to participate. Pt transferred to recliner and displayed SOB, but SpO2 remains >90% on RA. Pt denying need for ADL at this time as he feels fatigued. OT to continue to address ADL tasks with energy conservation. Pt would benefit from continued OT skilled services for ADL, mobility and safety in [name] setting. Central line placed today. OT to follow acutely.  BP 123/100 in sitting after exertion    Follow Up Recommendations  [name];Supervision/Assistance - 24 hour    Equipment Recommendations  Other (comment)(TBD)    Recommendations for Other Services      Precautions / Restrictions Precautions Precautions: Fall;Other (comment) Precaution Comments: NG tube Restrictions Weight Bearing Restrictions: No       Mobility Bed Mobility Overal bed mobility: Needs Assistance Bed Mobility: Rolling;Sidelying to Sit Rolling: Min assist(+ rail) Sidelying to sit: Min assist(+ rail)       General bed mobility comments: cues for sequencing; assist to elevate trunk into sitting; cues for scooting hips to EOB  Transfers Overall transfer level: Needs assistance Equipment used: Rolling walker (2 wheeled) Transfers: Sit to/from Stand Sit to Stand: Min assist;+2  physical assistance;+2 safety/equipment Stand pivot transfers: Min assist;+2 physical assistance;+2 safety/equipment       General transfer comment: Requires cues for wider gait pattern for stability    Balance Overall balance assessment: Needs assistance   Sitting balance-Leahy Scale: Fair       Standing balance-Leahy Scale: Poor Standing balance comment: Pt relying on UE support for standing                           ADL either performed or assessed with clinical judgement   ADL Overall ADL's : Needs assistance/impaired                                     Functional mobility during ADLs: Minimal assistance;+2 for physical assistance;+2 for safety/equipment;Cueing for safety;Cueing for sequencing General ADL Comments: Pt not interested in perform UB ADL, but requires assist for all LB ADL as it is uncomfortable to bend     Vision       Perception     Praxis      Cognition Arousal/Alertness: Lethargic Behavior During Therapy: Flat affect Overall Cognitive Status: Difficult to assess                                 General Comments: patient following commands consistently and communicating re: pain and functional status vis minimal verbalizations and hand gestures        Exercises     Shoulder  Instructions       General Comments NGT clamped during session and pt reported dizziness 123/100 at EOB. Pt ambulating from bed to front foor in room with RW today. Pt exhibited dyspnea, but SpO2 sats remained >90% on RA.    Pertinent Vitals/ Pain       Pain Assessment: 0-10 Pain Score: 5  Pain Descriptors / Indicators: Grimacing Pain Intervention(s): Limited activity within patient's tolerance;Repositioned  Home Living                                          Prior Functioning/Environment              Frequency  Min 2X/week        Progress Toward Goals  OT Goals(current goals can now be found  in the care plan section)  Progress towards OT goals: Progressing toward goals  Acute Rehab OT Goals Patient Stated Goal: get out of here OT Goal Formulation: With patient Time For Goal Achievement: 02/26/19 Potential to Achieve Goals: Good ADL Goals Pt Will Perform Grooming: with modified independence;standing Pt Will Perform Lower Body Dressing: with modified independence;with adaptive equipment;sit to/from stand Pt Will Transfer to Toilet: with modified independence;bedside commode;ambulating Pt Will Perform Toileting - Clothing Manipulation and hygiene: with modified independence;sit to/from stand;sitting/lateral leans Additional ADL Goal #1: Pt will perform bed mobility with supervision in preparation for ADLs  Plan Discharge plan remains appropriate    Co-evaluation      Reason for Co-Treatment: For patient/therapist safety;To address functional/ADL transfers   OT goals addressed during session: Other (comment)(balance)      AM-PAC OT "6 Clicks" Daily Activity     Outcome Measure   Help from another person eating meals?: (n/a) Help from another person taking care of personal grooming?: A Lot Help from another person toileting, which includes using toliet, bedpan, or urinal?: A Lot Help from another person bathing (including washing, rinsing, drying)?: A Lot Help from another person to put on and taking off regular upper body clothing?: A Little Help from another person to put on and taking off regular lower body clothing?: Total 6 Click Score: 10    End of Session Equipment Utilized During Treatment: Gait belt;Rolling walker  OT Visit Diagnosis: Unsteadiness on feet (R26.81);Other abnormalities of gait and mobility (R26.89);Muscle weakness (generalized) (M62.81)   Activity Tolerance Patient limited by pain;Patient limited by fatigue;Patient limited by lethargy   Patient Left in chair;with call bell/phone within reach   Nurse Communication Mobility status         Time: 6440-3474 OT Time Calculation (min): 24 min  Charges: OT General Charges $OT Visit: 1 Visit OT Treatments $Neuromuscular Re-education: 8-22 mins  Cristi Loron) Glendell Docker OTR/L Acute Rehabilitation Services Pager: [phone] Office: [phone]   Sandrea Hughs 02/15/2019, 10:44 AM

## 2019-02-15 NOTE — Progress Notes (Signed)
ANTICOAGULATION CONSULT NOTE - Follow Up Consult  Pharmacy Consult for Heparin Indication: atrial fibrillation  No Known Allergies  Patient Measurements: Height: 6\' 5"  (195.6 cm) Weight: 204 lb 4.8 oz (92.7 kg) IBW/kg (Calculated) : 89.1 Heparin Dosing Weight: 84 kg  Vital Signs: Temp: 97.6 F (36.4 C) (02/28 0835) Temp Source: Oral (02/28 0835) BP: 124/73 (02/28 0835) Pulse Rate: 52 (02/28 0900)  Labs: Recent Labs    02/13/19 0402 02/13/19 0535 02/14/19 0249 02/15/19 0339  HGB 8.9*  --  7.4* 7.6*  HCT 26.7*  --  23.0* 24.3*  PLT 234  --  174 190  LABPROT 16.1*  --   --   --   INR 1.3*  --   --   --   HEPARINUNFRC 0.34  --  0.65 0.51  CREATININE  --  1.67* 1.51* 1.41*    Estimated Creatinine Clearance: 62.3 mL/min (A) (by C-G formula based on SCr of 1.41 mg/dL (H)).  Assessment: 23 yoM s/p aortabifem bypass on heparin infusion. Pt on warfarin PTA for hx of AFib, last dose was 2/12 which is now on hold due to ileus. Pharmacy consulted to resume heparin 2/14 with no bolus.  Heparin level at goal this morning. Heparin turned off for OR but now back on. Continue holding warfarin until able to take po's.   Goal of Therapy:  Heparin level 0.3-0.5 units/ml Monitor platelets by anticoagulation protocol: Yes   Plan:  -Continue heparin at 1650 units/hr -Holding warfarin for now, will resume once tolerating liquids -Daily heparin level, CBC and INR  Sheppard Coil PharmD., BCPS Clinical Pharmacist 02/15/2019 10:03 AM

## 2019-02-15 NOTE — Progress Notes (Signed)
Physical Therapy Treatment Patient Details Name: [name] [name] MRN: [id number] DOB: 1951/04/24 Today's Date: 02/15/2019    History of Present Illness Pt s/p Aortobifemoral bypass on 2/10. Received central line 2/28. Pt developed afib with rvr, acute renal failure and ileus 2/12-2/13. Pt had NG tube for ileus that was removed 2/17 and had to be replaced 2/19. 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     PT Comments    Pt progressing with encouragement. Requiring min assist for all functional mobility. Ambulating 15 feet with walker and close chair follow utilized. Continues with decreased endurance, balance, and weakness. Will challenge pt with increased ambulation as tolerated.    Follow Up Recommendations  SNF;Supervision/Assistance - 24 hour     Equipment Recommendations  Other (comment)(TBD)    Recommendations for Other Services       Precautions / Restrictions Precautions Precautions: Fall;Other (comment) Precaution Comments: NG tube Restrictions Weight Bearing Restrictions: No    Mobility  Bed Mobility Overal bed mobility: Needs Assistance Bed Mobility: Rolling;Sidelying to Sit Rolling: Min assist(+ rail) Sidelying to sit: Min assist(+ rail)       General bed mobility comments: cues for sequencing; assist to elevate trunk into sitting; cues for scooting hips to EOB  Transfers Overall transfer level: Needs assistance Equipment used: Rolling walker (2 wheeled) Transfers: Sit to/from Stand Sit to Stand: Min assist         General transfer comment: min assist to stabilize  Ambulation/Gait Ambulation/Gait assistance: Min assist;+2 safety/equipment Gait Distance (Feet): 15 Feet Assistive device: Rolling walker (2 wheeled) Gait Pattern/deviations: Shuffle;Step-to pattern;Decreased stride length;Narrow base of support     General Gait Details: cues for wider BOS. chair follow utilized   Metallurgist Rankin (Stroke Patients Only)       Balance Overall balance assessment: Needs assistance   Sitting balance-Leahy Scale: Fair       Standing balance-Leahy Scale: Poor Standing balance comment: Pt relying on UE support for standing                            Cognition Arousal/Alertness: Awake/alert Behavior During Therapy: Flat affect Overall Cognitive Status: Difficult to assess                                 General Comments: patient following commands consistently and communicating re: pain and functional status vis minimal verbalizations and hand gestures      Exercises      General Comments        Pertinent Vitals/Pain Pain Assessment: 0-10 Pain Score: 5  Pain Descriptors / Indicators: Grimacing Pain Intervention(s): Monitored during session    Home Living                      Prior Function            PT Goals (current goals can now be found in the care plan section) Acute Rehab PT Goals Patient Stated Goal: get out of here Potential to Achieve Goals: Good Progress towards PT goals: Progressing toward goals    Frequency    Min 3X/week      PT Plan Current plan remains appropriate;Frequency needs to be updated    Co-evaluation PT/OT/SLP Co-Evaluation/Treatment: Yes Reason for  Co-Treatment: For patient/therapist safety;To address functional/ADL transfers PT goals addressed during session: Mobility/safety with mobility        AM-PAC PT "6 Clicks" Mobility   Outcome Measure  Help needed turning from your back to your side while in a flat bed without using bedrails?: A Little Help needed moving from lying on your back to sitting on the side of a flat bed without using bedrails?: A Little Help needed moving to and from a bed to a chair (including a wheelchair)?: A Little Help needed standing up from a chair using your arms (e.g., wheelchair or bedside chair)?: A  Little Help needed to walk in hospital room?: A Little Help needed climbing 3-5 steps with a railing? : Total 6 Click Score: 16    End of Session Equipment Utilized During Treatment: Gait belt Activity Tolerance: Patient tolerated treatment well Patient left: in chair;with call bell/phone within reach Nurse Communication: Mobility status PT Visit Diagnosis: Other abnormalities of gait and mobility (R26.89);Muscle weakness (generalized) (M62.81)     Time: 6010-9323 PT Time Calculation (min) (ACUTE ONLY): 23 min  Charges:  $Gait Training: 8-22 mins                     Laurina Bustle, [place], DPT Acute Rehabilitation Services Pager [phone] Office [phone]    Vanetta Mulders 02/15/2019, 2:25 PM

## 2019-02-15 NOTE — Op Note (Signed)
Procedure: Insertion of triple-lumen catheter right internal jugular vein  Preoperative diagnosis: Prolonged ileus  Postoperative diagnosis: Same  Anesthesia: Local  Operative findings: Triple-lumen catheter placed right internal jugular vein  Operative details: After obtaining form consent, the patient was taken the PV lab.  The patient was placed in supine position on the angios table.  Patient's entire neck and chest were prepped and draped in usual sterile fashion.  Local anesthesia was infiltrated over the right internal jugular vein.  Ultrasound was used to identify the right internal jugular vein which had normal compressibility and respiratory variation.  Hardcopy image was obtained for the patient's chart.  Introducer needle was used to cannulate the right internal jugular vein and an 035 J-wire threaded into the right atrium under fluoroscopic guidance.  Dilator was placed over the guidewire to dilate the tract.  Triple-lumen catheter was then placed with its tip at the cavoatrial junction.  It was sutured the skin with nylon stitch.  All 3 ports were noted to flush and draw easily.  All 3 ports were loaded with heparinized saline.  The patient tolerated procedure well and there were no complications.  The patient was taken back to 4 E. in stable condition.  [name], MD [facility] [place] Office: [phone] Pager: [phone]

## 2019-02-15 NOTE — Progress Notes (Signed)
Patient returned from vascular lab with heparin turned off. Dr. Darrick Penna paged at this time. Ok to restart heparin at this time.

## 2019-02-15 NOTE — Progress Notes (Signed)
PT Cancellation Note  Patient Details Name: [name] [name] MRN: [id number] DOB: 12/16/1951   Cancelled Treatment:    Reason Eval/Treat Not Completed: Patient at procedure or test/unavailable (central line placement).  Laurina Bustle, PT, DPT Acute Rehabilitation Services Pager [phone] Office [phone]    Vanetta Mulders 02/15/2019, 8:11 AM

## 2019-02-16 LAB — GLUCOSE, CAPILLARY
GLUCOSE-CAPILLARY: 113 mg/dL — AB (ref 70–99)
Glucose-Capillary: 114 mg/dL — ABNORMAL HIGH (ref 70–99)
Glucose-Capillary: 125 mg/dL — ABNORMAL HIGH (ref 70–99)

## 2019-02-16 LAB — DIFFERENTIAL
ABS IMMATURE GRANULOCYTES: 0.06 10*3/uL (ref 0.00–0.07)
Basophils Absolute: 0 10*3/uL (ref 0.0–0.1)
Basophils Relative: 0 %
Eosinophils Absolute: 0.5 10*3/uL (ref 0.0–0.5)
Eosinophils Relative: 6 %
Immature Granulocytes: 1 %
Lymphocytes Relative: 14 %
Lymphs Abs: 1.2 10*3/uL (ref 0.7–4.0)
Monocytes Absolute: 0.6 10*3/uL (ref 0.1–1.0)
Monocytes Relative: 7 %
Neutro Abs: 6 10*3/uL (ref 1.7–7.7)
Neutrophils Relative %: 72 %

## 2019-02-16 LAB — COMPREHENSIVE METABOLIC PANEL
ALT: 46 U/L — AB (ref 0–44)
AST: 42 U/L — ABNORMAL HIGH (ref 15–41)
Albumin: 1.7 g/dL — ABNORMAL LOW (ref 3.5–5.0)
Alkaline Phosphatase: 80 U/L (ref 38–126)
Anion gap: 7 (ref 5–15)
BILIRUBIN TOTAL: 1 mg/dL (ref 0.3–1.2)
BUN: 12 mg/dL (ref 8–23)
CO2: 21 mmol/L — ABNORMAL LOW (ref 22–32)
Calcium: 7.4 mg/dL — ABNORMAL LOW (ref 8.9–10.3)
Chloride: 109 mmol/L (ref 98–111)
Creatinine, Ser: 1.26 mg/dL — ABNORMAL HIGH (ref 0.61–1.24)
GFR calc Af Amer: >60 mL/min (ref >60)
GFR calc non Af Amer: 58 mL/min — ABNORMAL LOW (ref >60)
Glucose, Bld: 128 mg/dL — ABNORMAL HIGH (ref 70–99)
Potassium: 4.1 mmol/L (ref 3.5–5.1)
Sodium: 137 mmol/L (ref 135–145)
Total Protein: 5.5 g/dL — ABNORMAL LOW (ref 6.5–8.1)

## 2019-02-16 LAB — CBC
HEMATOCRIT: 24.4 % — AB (ref 39.0–52.0)
HEMOGLOBIN: 7.6 g/dL — AB (ref 13.0–17.0)
MCH: 29.1 pg (ref 26.0–34.0)
MCHC: 31.1 g/dL (ref 30.0–36.0)
MCV: 93.5 fL (ref 80.0–100.0)
Platelets: 231 10*3/uL (ref 150–400)
RBC: 2.61 MIL/uL — ABNORMAL LOW (ref 4.22–5.81)
RDW: 16 % — ABNORMAL HIGH (ref 11.5–15.5)
WBC: 8.3 10*3/uL (ref 4.0–10.5)
nRBC: 0 % (ref 0.0–0.2)

## 2019-02-16 LAB — MAGNESIUM: Magnesium: 2.1 mg/dL (ref 1.7–2.4)

## 2019-02-16 LAB — HEPARIN LEVEL (UNFRACTIONATED): Heparin Unfractionated: 0.5 IU/mL (ref 0.30–0.70)

## 2019-02-16 LAB — TRIGLYCERIDES: Triglycerides: 45 mg/dL (ref <150)

## 2019-02-16 LAB — PHOSPHORUS: Phosphorus: 3 mg/dL (ref 2.5–4.6)

## 2019-02-16 LAB — PREALBUMIN: Prealbumin: 10.8 mg/dL — ABNORMAL LOW (ref 18–38)

## 2019-02-16 MED ORDER — TRAVASOL 10 % IV SOLN
INTRAVENOUS | Status: AC
  Administered 2019-02-16: 19:00:00 via INTRAVENOUS
  Filled 2019-02-16: qty 1209.6

## 2019-02-16 NOTE — Progress Notes (Signed)
CSW continues to follow to assist with discharge planning needs.  Antony Blackbird, [facility] Clinical Social Worker [phone]

## 2019-02-16 NOTE — Progress Notes (Signed)
PHARMACY - ADULT TOTAL PARENTERAL NUTRITION CONSULT NOTE   Pharmacy Consult:  TPN Indication:  Prolonged ileus  Patient Measurements: Height: 6\' 5"  (195.6 cm) Weight: 204 lb 4.8 oz (92.7 kg) IBW/kg (Calculated) : 89.1 TPN AdjBW (KG): 84.4 Body mass index is 24.23 kg/m.  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  He presented on 01/28/19 for aorta bifemoral bypass graft, femoral endarterectomy and iliac thrombectomy.  Patient was started on a clear liquid diet post-op but intake was inadequate.  NG tube placed on 01/31/19 and patient was made NPO again.  He was started on clears on 02/05/19 and vomitted.  Pharmacy consulted to manage TPN for nutrition support.    Patient has aphasia and reports he eats primarily soup and was eating regularly up until the day of admission.  He has minimal to no intake for ~10 days and is at risk for refeeding.  GI: persistent ileus. Prealbumin improved to 10.8, NG O/P , LBM 2/21. Last abdominal Xray on 2/25 showed improvement in ileus. PPI IV, PRN Zofran. Repeat CT if O/P continues Endo: no hx DM - CBGs controlled.  Lytes: wnl today, K up to 4.1 (goal > 4 in ileus), Cl down to 109, HCO3 21.  Renal: SCr back down to 1.26. BUN down to 12 - UOP 0.8 ml/kg/hr, D5NS at 20/hr, net +2L decrease, placing foley 2/21 CT showed left hydronephrosis Pulm: stable on RA Cards: VSS (in Afib) - amiodarone gtt, off ASA AC: Heparin for Afib - H/H up, plts wnl.  2/21 CT showed RP hematoma Hepatobil: LFTs slightly elevated, Tbili down to wnl. Trig wnl. Neuro: PVD/CVA - PRN Dilaudid ID: Enterobacter bacteremia. Now afebrile, WBC wnl. PICC pulled and now planning to place new one on 2/28.  TPN Access: New CVC triple lumen placed on 2/28 TPN start date: 02/06/19  Nutritional Goals (RD rec 2/27): 2200-2400 kCal, 110-125g protein, >/= 2.2L fluid per day  Current Nutrition: NPO  TPN  Plan:  Continue TPN tonight at 11mL/hr This TPN provides 121g  AA, 324g CHO and 69g ILE for a total of 2,275 kCal, meeting 100% of patient's needs. Electrolytes in TPN: max Na, Cl:Ac 1:2 Daily multivitamin and trace elements in TPN Continue D5NS at 26mL/hr per MD Monitor TPN labs   Enzo Bi, PharmD, BCPS, [facility] Clinical Pharmacist Phone number [phone] 02/16/2019 8:54 AM

## 2019-02-16 NOTE — Progress Notes (Signed)
ANTICOAGULATION CONSULT NOTE - Follow Up Consult  Pharmacy Consult for Heparin Indication: atrial fibrillation  No Known Allergies  Patient Measurements: Height: 6\' 5"  (195.6 cm) Weight: 204 lb 4.8 oz (92.7 kg) IBW/kg (Calculated) : 89.1 Heparin Dosing Weight: 84 kg  Vital Signs: Temp: 97.8 F (36.6 C) (02/29 0434) Temp Source: Oral (02/29 0434) BP: 126/81 (02/29 0434) Pulse Rate: 58 (02/29 0434)  Labs: Recent Labs    02/14/19 0249 02/15/19 0339 02/16/19 0430 02/16/19 0431  HGB 7.4* 7.6* 7.6*  --   HCT 23.0* 24.3* 24.4*  --   PLT 174 190 231  --   HEPARINUNFRC 0.65 0.51  --  0.50  CREATININE 1.51* 1.41* 1.26*  --     Estimated Creatinine Clearance: 69.7 mL/min (A) (by C-G formula based on SCr of 1.26 mg/dL (H)).  Assessment: 61 yoM s/p aortabifem bypass on heparin infusion. Pt on warfarin PTA for hx of AFib, last dose was 2/12 which is now on hold due to ileus. Pharmacy consulted to resume heparin 2/14 with no bolus.  Heparin level at goal this morning. H/H low stable. Plt wnl. Continue holding warfarin until able to take po's.   Goal of Therapy:  Heparin level 0.3-0.5 units/ml Monitor platelets by anticoagulation protocol: Yes   Plan:  -Continue heparin at 1650 units/hr -Holding warfarin for now, will resume once tolerating liquids -Daily heparin level, CBC and INR  Vinnie Level, PharmD., BCPS Clinical Pharmacist Clinical phone for 02/16/19 until 3:30pm: [phone] If after 3:30pm, please refer to [facility] for unit-specific pharmacist

## 2019-02-16 NOTE — Progress Notes (Signed)
  Progress Note    02/16/2019 11:21 AM 1 Day Post-Op  Subjective: Denies any flatus, feeling okay today  Vitals:   02/16/19 0700 02/16/19 0900  BP:    Pulse:    Resp: 12 13  Temp:    SpO2:      Physical Exam: Awake and alert Moving left arm NG tube in place with 100 cc output Abdomen is soft  CBC    Component Value Date/Time   WBC 8.3 02/16/2019 0430   RBC 2.61 (L) 02/16/2019 0430   HGB 7.6 (L) 02/16/2019 0430   HGB 15.7 09/27/2017 0930   HCT 24.4 (L) 02/16/2019 0430   HCT 46.8 09/27/2017 0930   PLT 231 02/16/2019 0430   PLT 275 09/27/2017 0930   MCV 93.5 02/16/2019 0430   MCV 94 09/27/2017 0930   MCH 29.1 02/16/2019 0430   MCHC 31.1 02/16/2019 0430   RDW 16.0 (H) 02/16/2019 0430   RDW 14.8 09/27/2017 0930   LYMPHSABS 1.2 02/16/2019 0430   MONOABS 0.6 02/16/2019 0430   EOSABS 0.5 02/16/2019 0430   BASOSABS 0.0 02/16/2019 0430    BMET    Component Value Date/Time   NA 137 02/16/2019 0430   NA 140 09/27/2017 0930   K 4.1 02/16/2019 0430   CL 109 02/16/2019 0430   CO2 21 (L) 02/16/2019 0430   GLUCOSE 128 (H) 02/16/2019 0430   BUN 12 02/16/2019 0430   BUN 14 09/27/2017 0930   CREATININE 1.26 (H) 02/16/2019 0430   CALCIUM 7.4 (L) 02/16/2019 0430   GFRNONAA 58 (L) 02/16/2019 0430   GFRAA >60 02/16/2019 0430    INR    Component Value Date/Time   INR 1.3 (H) 02/13/2019 0402     Intake/Output Summary (Last 24 hours) at 02/16/2019 1121 Last data filed at 02/16/2019 1112 Gross per 24 hour  Intake 2559.85 ml  Output 3750 ml  Net -1190.15 ml     Assessment/plan:  68 y.o. male is here with prolonged postoperative ileus on antibiotics for positive urine and blood cultures.  New central line placed yesterday for TPN.  CT scan contrast was into the right colon yesterday without any definitive bowel issues.  He is still not having any flatus.  Continue amiodarone and heparin drip for atrial fibrillation.    C. Randie Heinz, MD [facility] [place] Office: [phone] Pager: [phone]  02/16/2019 11:21 AM

## 2019-02-17 ENCOUNTER — Inpatient Hospital Stay (HOSPITAL_COMMUNITY): Payer: Medicare HMO

## 2019-02-17 LAB — CBC
HCT: 25.5 % — ABNORMAL LOW (ref 39.0–52.0)
Hemoglobin: 7.8 g/dL — ABNORMAL LOW (ref 13.0–17.0)
MCH: 28.9 pg (ref 26.0–34.0)
MCHC: 30.6 g/dL (ref 30.0–36.0)
MCV: 94.4 fL (ref 80.0–100.0)
PLATELETS: 267 10*3/uL (ref 150–400)
RBC: 2.7 MIL/uL — ABNORMAL LOW (ref 4.22–5.81)
RDW: 16.2 % — ABNORMAL HIGH (ref 11.5–15.5)
WBC: 10 10*3/uL (ref 4.0–10.5)
nRBC: 0 % (ref 0.0–0.2)

## 2019-02-17 LAB — GLUCOSE, CAPILLARY
Glucose-Capillary: 100 mg/dL — ABNORMAL HIGH (ref 70–99)
Glucose-Capillary: 109 mg/dL — ABNORMAL HIGH (ref 70–99)

## 2019-02-17 LAB — BASIC METABOLIC PANEL
Anion gap: 5 (ref 5–15)
BUN: 13 mg/dL (ref 8–23)
CO2: 24 mmol/L (ref 22–32)
Calcium: 7.7 mg/dL — ABNORMAL LOW (ref 8.9–10.3)
Chloride: 108 mmol/L (ref 98–111)
Creatinine, Ser: 1.3 mg/dL — ABNORMAL HIGH (ref 0.61–1.24)
GFR calc Af Amer: >60 mL/min (ref >60)
GFR calc non Af Amer: 56 mL/min — ABNORMAL LOW (ref >60)
Glucose, Bld: 111 mg/dL — ABNORMAL HIGH (ref 70–99)
Potassium: 4.1 mmol/L (ref 3.5–5.1)
Sodium: 137 mmol/L (ref 135–145)

## 2019-02-17 LAB — HEPARIN LEVEL (UNFRACTIONATED): Heparin Unfractionated: 0.36 IU/mL (ref 0.30–0.70)

## 2019-02-17 MED ORDER — TRAVASOL 10 % IV SOLN
INTRAVENOUS | Status: AC
  Administered 2019-02-17: 17:00:00 via INTRAVENOUS
  Filled 2019-02-17: qty 1209.6

## 2019-02-17 MED ORDER — BISACODYL 10 MG RE SUPP
10.0000 mg | Freq: Once | RECTAL | Status: AC
  Administered 2019-02-17: 10 mg via RECTAL
  Filled 2019-02-17: qty 1

## 2019-02-17 NOTE — Progress Notes (Signed)
ANTICOAGULATION CONSULT NOTE - Follow Up Consult  Pharmacy Consult for Heparin Indication: atrial fibrillation  No Known Allergies  Patient Measurements: Height: 6\' 5"  (195.6 cm) Weight: 204 lb 4.8 oz (92.7 kg) IBW/kg (Calculated) : 89.1 Heparin Dosing Weight: 84 kg  Vital Signs: Temp: 98.7 F (37.1 C) (03/01 0302) Temp Source: Axillary (03/01 0302) BP: 136/78 (03/01 0302) Pulse Rate: 54 (03/01 0302)  Labs: Recent Labs    02/15/19 0339 02/16/19 0430 02/16/19 0431 02/17/19 0352  HGB 7.6* 7.6*  --  7.8*  HCT 24.3* 24.4*  --  25.5*  PLT 190 231  --  267  HEPARINUNFRC 0.51  --  0.50 0.36  CREATININE 1.41* 1.26*  --  1.30*    Estimated Creatinine Clearance: 67.6 mL/min (A) (by C-G formula based on SCr of 1.3 mg/dL (H)).  Assessment: 67 yoM s/p aortabifem bypass on heparin infusion. Pt on warfarin PTA for hx of AFib, last dose was 2/12 which is now on hold due to ileus. Pharmacy consulted to resume heparin 2/14 with no bolus.  Heparin level at goal this morning. H/H low stable. Plt wnl. Continue holding warfarin until able to take po's.   Goal of Therapy:  Heparin level 0.3-0.5 units/ml Monitor platelets by anticoagulation protocol: Yes   Plan:  -Continue heparin at 1650 units/hr -Holding warfarin for now, will resume once tolerating liquids -Daily heparin level, CBC and INR  Vinnie Level, PharmD., BCPS Clinical Pharmacist Clinical phone for 02/17/19 until 3:30pm: [phone] If after 3:30pm, please refer to [facility] for unit-specific pharmacist

## 2019-02-17 NOTE — Progress Notes (Signed)
PHARMACY - ADULT TOTAL PARENTERAL NUTRITION CONSULT NOTE   Pharmacy Consult:  TPN Indication:  Prolonged ileus  Patient Measurements: Height: 6\' 5"  (195.6 cm) Weight: 204 lb 4.8 oz (92.7 kg) IBW/kg (Calculated) : 89.1 TPN AdjBW (KG): 84.4 Body mass index is 24.23 kg/m.  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  He presented on 01/28/19 for aorta bifemoral bypass graft, femoral endarterectomy and iliac thrombectomy.  Patient was started on a clear liquid diet post-op but intake was inadequate.  NG tube placed on 01/31/19 and patient was made NPO again.  He was started on clears on 02/05/19 and vomitted.  Pharmacy consulted to manage TPN for nutrition support.    Patient has aphasia and reports he eats primarily soup and was eating regularly up until the day of admission.  He has minimal to no intake for ~10 days and is at risk for refeeding.  GI: Has persistent ileus. No flatus noticed by vascular. Prealbumin improved to 10.8, NG O/P up a little to , LBM 2/21. Last abdominal Xray on 2/25 showed improvement in ileus. PPI IV, PRN Zofran. Repeat CT if O/P continues Endo: no hx DM - CBGs controlled.  Lytes: wnl today, K up to 4.1 (goal > 4 in ileus), Cl down to 108, HCO3 21.  Renal: SCr back down to 1.3. BUN down to 13 - UOP 0.8 ml/kg/hr, D5NS at 20/hr, net +2L decrease, placing foley 2/21 CT showed left hydronephrosis Pulm: stable on RA Cards: VSS (in Afib) - amiodarone gtt, off ASA AC: Heparin for Afib - H/H up, plts wnl.  2/21 CT showed RP hematoma Hepatobil: LFTs slightly elevated, Tbili down to wnl. Trig wnl. Neuro: PVD/CVA - PRN Dilaudid ID: Enterobacter bacteremia. Now afebrile, WBC wnl. PICC pulled and now planning to place new one on 2/28.  TPN Access: New CVC triple lumen placed on 2/28 TPN start date: 02/06/19  Nutritional Goals (RD rec 2/27): 2200-2400 kCal, 110-125g protein, >/= 2.2L fluid per day  Current Nutrition: NPO  TPN  Plan:  Continue  TPN tonight at 3mL/hr This TPN provides 121g AA, 324g CHO and 69g ILE for a total of 2,275 kCal, meeting 100% of patient's needs. Electrolytes in TPN: max Na, Cl:Ac 1:2 Daily multivitamin and trace elements in TPN Continue D5NS at 60mL/hr per MD Monitor TPN labs   Enzo Bi, PharmD, BCPS, [facility] Clinical Pharmacist Phone number [phone] 02/17/2019 7:40 AM

## 2019-02-17 NOTE — Progress Notes (Addendum)
  Progress Note    02/17/2019 1:27 PM 2 Days Post-Op  Subjective: Denies flatus or bowel movement  Vitals:   02/16/19 2029 02/17/19 0302  BP: (!) 142/81 136/78  Pulse: (!) 57 (!) 54  Resp: 20 16  Temp: 98.3 F (36.8 C) 98.7 F (37.1 C)  SpO2: 99% 98%    Physical Exam: Awake and alert Neurologically stable Abdomen remains mildly distended with minimal tenderness palpation Bilateral feet are warm  NG tube output 700 cc  CBC    Component Value Date/Time   WBC 10.0 02/17/2019 0352   RBC 2.70 (L) 02/17/2019 0352   HGB 7.8 (L) 02/17/2019 0352   HGB 15.7 09/27/2017 0930   HCT 25.5 (L) 02/17/2019 0352   HCT 46.8 09/27/2017 0930   PLT 267 02/17/2019 0352   PLT 275 09/27/2017 0930   MCV 94.4 02/17/2019 0352   MCV 94 09/27/2017 0930   MCH 28.9 02/17/2019 0352   MCHC 30.6 02/17/2019 0352   RDW 16.2 (H) 02/17/2019 0352   RDW 14.8 09/27/2017 0930   LYMPHSABS 1.2 02/16/2019 0430   MONOABS 0.6 02/16/2019 0430   EOSABS 0.5 02/16/2019 0430   BASOSABS 0.0 02/16/2019 0430    BMET    Component Value Date/Time   NA 137 02/17/2019 0352   NA 140 09/27/2017 0930   K 4.1 02/17/2019 0352   CL 108 02/17/2019 0352   CO2 24 02/17/2019 0352   GLUCOSE 111 (H) 02/17/2019 0352   BUN 13 02/17/2019 0352   BUN 14 09/27/2017 0930   CREATININE 1.30 (H) 02/17/2019 0352   CALCIUM 7.7 (L) 02/17/2019 0352   GFRNONAA 56 (L) 02/17/2019 0352   GFRAA >60 02/17/2019 0352    INR    Component Value Date/Time   INR 1.3 (H) 02/13/2019 0402     Intake/Output Summary (Last 24 hours) at 02/17/2019 1327 Last data filed at 02/17/2019 1327 Gross per 24 hour  Intake 4067.33 ml  Output 7365 ml  Net -3297.67 ml     Assessment:  68 y.o. male is s/p aortobifem bypass for bilateral rest pain now with prolonged postoperative ileus on antibiotics.  Plan: Continue NG tube to suction TPN Amiodarone heparin drip for atrial fibrillation. KUB to evaluate for passage of contrast.  [name] Detweiler C. Randie Heinz,  MD [facility] [place] Office: [phone] Pager: [phone]  02/17/2019 1:27 PM

## 2019-02-18 DIAGNOSIS — I4819 Other persistent atrial fibrillation: Secondary | ICD-10-CM

## 2019-02-18 LAB — COMPREHENSIVE METABOLIC PANEL
ALK PHOS: 94 U/L (ref 38–126)
ALT: 66 U/L — AB (ref 0–44)
AST: 62 U/L — ABNORMAL HIGH (ref 15–41)
Albumin: 1.9 g/dL — ABNORMAL LOW (ref 3.5–5.0)
Anion gap: 4 — ABNORMAL LOW (ref 5–15)
BUN: 12 mg/dL (ref 8–23)
CALCIUM: 7.9 mg/dL — AB (ref 8.9–10.3)
CO2: 24 mmol/L (ref 22–32)
Chloride: 107 mmol/L (ref 98–111)
Creatinine, Ser: 1.25 mg/dL — ABNORMAL HIGH (ref 0.61–1.24)
GFR calc Af Amer: >60 mL/min (ref >60)
GFR calc non Af Amer: 58 mL/min — ABNORMAL LOW (ref >60)
Glucose, Bld: 119 mg/dL — ABNORMAL HIGH (ref 70–99)
Potassium: 4.1 mmol/L (ref 3.5–5.1)
Sodium: 135 mmol/L (ref 135–145)
TOTAL PROTEIN: 6 g/dL — AB (ref 6.5–8.1)
Total Bilirubin: 1.1 mg/dL (ref 0.3–1.2)

## 2019-02-18 LAB — PREALBUMIN: Prealbumin: 15.3 mg/dL — ABNORMAL LOW (ref 18–38)

## 2019-02-18 LAB — DIFFERENTIAL
Abs Immature Granulocytes: 0.18 10*3/uL — ABNORMAL HIGH (ref 0.00–0.07)
Basophils Absolute: 0.1 10*3/uL (ref 0.0–0.1)
Basophils Relative: 0 %
Eosinophils Absolute: 0.7 10*3/uL — ABNORMAL HIGH (ref 0.0–0.5)
Eosinophils Relative: 5 %
Immature Granulocytes: 1 %
Lymphocytes Relative: 12 %
Lymphs Abs: 1.6 10*3/uL (ref 0.7–4.0)
MONOS PCT: 6 %
Monocytes Absolute: 0.7 10*3/uL (ref 0.1–1.0)
Neutro Abs: 9.8 10*3/uL — ABNORMAL HIGH (ref 1.7–7.7)
Neutrophils Relative %: 76 %

## 2019-02-18 LAB — CBC
HCT: 25.9 % — ABNORMAL LOW (ref 39.0–52.0)
Hemoglobin: 8.2 g/dL — ABNORMAL LOW (ref 13.0–17.0)
MCH: 29.4 pg (ref 26.0–34.0)
MCHC: 31.7 g/dL (ref 30.0–36.0)
MCV: 92.8 fL (ref 80.0–100.0)
Platelets: 300 10*3/uL (ref 150–400)
RBC: 2.79 MIL/uL — ABNORMAL LOW (ref 4.22–5.81)
RDW: 15.9 % — ABNORMAL HIGH (ref 11.5–15.5)
WBC: 13.1 10*3/uL — ABNORMAL HIGH (ref 4.0–10.5)
nRBC: 0 % (ref 0.0–0.2)

## 2019-02-18 LAB — CULTURE, BLOOD (ROUTINE X 2)
Culture: NO GROWTH
Special Requests: ADEQUATE

## 2019-02-18 LAB — HEPARIN LEVEL (UNFRACTIONATED): Heparin Unfractionated: 0.4 [IU]/mL (ref 0.30–0.70)

## 2019-02-18 LAB — TRIGLYCERIDES: Triglycerides: 51 mg/dL (ref <150)

## 2019-02-18 LAB — PHOSPHORUS: Phosphorus: 3.5 mg/dL (ref 2.5–4.6)

## 2019-02-18 LAB — GLUCOSE, CAPILLARY: Glucose-Capillary: 102 mg/dL — ABNORMAL HIGH (ref 70–99)

## 2019-02-18 LAB — MAGNESIUM: Magnesium: 2 mg/dL (ref 1.7–2.4)

## 2019-02-18 MED ORDER — SODIUM CHLORIDE 0.9 % IV SOLN
2.0000 g | Freq: Three times a day (TID) | INTRAVENOUS | Status: DC
  Administered 2019-02-18 – 2019-02-22 (×13): 2 g via INTRAVENOUS
  Filled 2019-02-18 (×16): qty 2

## 2019-02-18 MED ORDER — TRAVASOL 10 % IV SOLN
INTRAVENOUS | Status: AC
  Administered 2019-02-18: 17:00:00 via INTRAVENOUS
  Filled 2019-02-18: qty 1209.6

## 2019-02-18 NOTE — Plan of Care (Signed)
Care plan has been reviewed: progressing outcome.   Problem: Tissue Perfusion: sinus bradycardia, HR 50s, BP remains stable. Goal: Hemodynamically stable with effective tissue perfusion will improve Outcome: Progressing, afebrile, vital sings  stable, no acute distress. Continue to monitor.   Problem: Bowel/Gastric: NPO, NG tube with low continue suction - , NG tube flush with water 30 ml q 4 hr, minimal gastric content with light green yellowish. Abdomen: soft,non-distend, hypoactive bowel sound. Goal: Gastrointestinal status for postoperative course will improve,GI tract motility and GI tissue perfusion will improve Outcome: Progressing: on TPN. Follow up lab at am. Pt had bowel movement yesterday.  Problem: Activity: limited activities due to LAD and post op Goal: Risk for activity intolerance will decrease Outcome: Progressing: PT has seen and followed pt, able to turn and self regulate his position. No pressure sore or skin damaged has seen.  [name], BSN,RN,PCCN-CMC

## 2019-02-18 NOTE — Progress Notes (Signed)
  Progress Note    02/18/2019 7:55 AM 3 Days Post-Op  Subjective:  Says he has had a couple of BM's.  Walked a little in the hallway.  Denies N/V.    Afebrile HR 50's-60's (report by RN that he dipped to 28-30's overnight while sleeping and amio was turned off). 98% RA  Vitals:   02/18/19 0549 02/18/19 0551  BP: 138/79 138/79  Pulse: (!) 57 (!) 58  Resp: (!) 22 17  Temp: 97.8 F (36.6 C)   SpO2: 98% 96%    Physical Exam: Cardiac:  regular Lungs:  Non labored Incisions:  Clean and dry (steri strips removed today) Extremities:  Bilateral feet are warm.  Still with pitting edema BLE. Abdomen:  Soft, NT/ND; +bowel sounds; +BM x 2  CBC    Component Value Date/Time   WBC 13.1 (H) 02/18/2019 0331   RBC 2.79 (L) 02/18/2019 0331   HGB 8.2 (L) 02/18/2019 0331   HGB 15.7 09/27/2017 0930   HCT 25.9 (L) 02/18/2019 0331   HCT 46.8 09/27/2017 0930   PLT 300 02/18/2019 0331   PLT 275 09/27/2017 0930   MCV 92.8 02/18/2019 0331   MCV 94 09/27/2017 0930   MCH 29.4 02/18/2019 0331   MCHC 31.7 02/18/2019 0331   RDW 15.9 (H) 02/18/2019 0331   RDW 14.8 09/27/2017 0930   LYMPHSABS 1.6 02/18/2019 0331   MONOABS 0.7 02/18/2019 0331   EOSABS 0.7 (H) 02/18/2019 0331   BASOSABS 0.1 02/18/2019 0331    BMET    Component Value Date/Time   NA 135 02/18/2019 0331   NA 140 09/27/2017 0930   K 4.1 02/18/2019 0331   CL 107 02/18/2019 0331   CO2 24 02/18/2019 0331   GLUCOSE 119 (H) 02/18/2019 0331   BUN 12 02/18/2019 0331   BUN 14 09/27/2017 0930   CREATININE 1.25 (H) 02/18/2019 0331   CALCIUM 7.9 (L) 02/18/2019 0331   GFRNONAA 58 (L) 02/18/2019 0331   GFRAA >60 02/18/2019 0331    INR    Component Value Date/Time   INR 1.3 (H) 02/13/2019 0402     Intake/Output Summary (Last 24 hours) at 02/18/2019 0755 Last data filed at 02/18/2019 0522 Gross per 24 hour  Intake 4242.29 ml  Output 7750 ml  Net -3507.71 ml     Assessment:  68 y.o. male is s/p:  Aortobifemoral bypass with  bilateral common femoral endarterectomy and profundoplasty, bilateral iliac thrombectomy  21 Days Post-Op  Plan: -pt had a couple of BM's yesterday; no N/V and abdomen is soft.  Continue npo today given consistency of NGT output.  -bradycardia over night and RN turned off amio.  Re-consulted cardiology.  -continue to mobilize -keep foley for strict uop -DVT prophylaxis:  Heparin gtt   Doreatha Massed, PA-C [facility] [phone] 02/18/2019 7:55 AM

## 2019-02-18 NOTE — Progress Notes (Signed)
HR dropped to 30s-50s BPM, stopped Amiodarone gtt, BP remained stable 146/83 mmHg, Pt was sleeping comfortably, easily arousable, awake,alert, oriented x4 , no distress. Will continue to monitor.  [name], BSN,RN,PCCN-CMC

## 2019-02-18 NOTE — Progress Notes (Signed)
Occupational Therapy Treatment Patient Details Name: [name] [name] MRN: [id number] DOB: 1951-03-26 Today's Date: 02/18/2019    History of present illness Pt s/p Aortobifemoral bypass on 2/10. Received central line 2/28. Pt developed afib with rvr, acute renal failure and ileus 2/12-2/13. Pt had NG tube for ileus that was removed 2/17 and had to be replaced 2/19. 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    OT comments  Pt continues to present with high motivation to participate in therapy despite fatigue and weakness. Pt performing transfer to recliner with Mod A for balance. Pt performing oral care with Min A for bilateral tasks while seated in recliner. Facilitating PROM and stretching of right hand and elevating at end of session. Continue to recommend dc to [name] and will continue to follow acutely as admitted.    Follow Up Recommendations  [name];Supervision/Assistance - 24 hour    Equipment Recommendations  Other (comment)(TBD)    Recommendations for Other Services PT consult    Precautions / Restrictions Precautions Precautions: Fall;Other (comment) Precaution Comments: NG tube Restrictions Weight Bearing Restrictions: No       Mobility Bed Mobility Overal bed mobility: Needs Assistance Bed Mobility: Supine to Sit     Supine to sit: Mod assist;HOB elevated     General bed mobility comments: Mod A for assist in elevatring trunk. Cues for sliding hips forward  Transfers Overall transfer level: Needs assistance Equipment used: None;1 person hand held assist Transfers: Sit to/from [employer] Sit to Stand: Min assist Stand pivot transfers: Mod assist       General transfer comment: Min A to power up and then Mod A for maintaining balance during pivot to recliner    Balance Overall balance assessment: Needs assistance Sitting-balance support: Single extremity supported Sitting balance-Leahy Scale: Fair      Standing balance support: Single extremity supported;During functional activity Standing balance-Leahy Scale: Poor Standing balance comment: Reliant on physical A                           ADL either performed or assessed with clinical judgement   ADL Overall ADL's : Needs assistance/impaired Eating/Feeding: NPO   Grooming: Oral care;Minimal assistance;Sitting Grooming Details (indicate cue type and reason): Min A to perform bilateral tasks.                 Toilet Transfer: Moderate assistance;Stand-pivot(simulated to recliner) Toilet Transfer Details (indicate cue type and reason): Mod A for power up into standing and then maintain balance in pivot to recliner.          Functional mobility during ADLs: Moderate assistance(stand pivot only) General ADL Comments: Pt continues to be very motivated to participate in therapy despite fatigues and weakness.     Vision   Vision Assessment?: No apparent visual deficits   Perception     Praxis      Cognition Arousal/Alertness: Awake/alert Behavior During Therapy: Flat affect Overall Cognitive Status: Difficult to assess                                 General Comments: patient following commands consistently and communicating re: pain and functional status vis minimal verbalizations and hand gestures        Exercises     Shoulder Instructions       General Comments VSS throughout    Pertinent Vitals/  Pain       Pain Assessment: Faces Faces Pain Scale: Hurts little more Pain Location: abdomen Pain Descriptors / Indicators: Grimacing Pain Intervention(s): Monitored during session;Limited activity within patient's tolerance;Repositioned  Home Living                                          Prior Functioning/Environment              Frequency  Min 3X/week        Progress Toward Goals  OT Goals(current goals can now be found in the care plan section)   Progress towards OT goals: Progressing toward goals  Acute Rehab OT Goals Patient Stated Goal: get out of here OT Goal Formulation: With patient Time For Goal Achievement: 02/26/19 Potential to Achieve Goals: Good ADL Goals Pt Will Perform Grooming: with modified independence;standing Pt Will Perform Lower Body Dressing: with modified independence;with adaptive equipment;sit to/from stand Pt Will Transfer to Toilet: with modified independence;bedside commode;ambulating Pt Will Perform Toileting - Clothing Manipulation and hygiene: with modified independence;sit to/from stand;sitting/lateral leans Additional ADL Goal #1: Pt will perform bed mobility with supervision in preparation for ADLs  Plan Discharge plan remains appropriate    Co-evaluation                 AM-PAC OT "6 Clicks" Daily Activity     Outcome Measure   Help from another person eating meals?: Total(NPO) Help from another person taking care of personal grooming?: A Lot Help from another person toileting, which includes using toliet, bedpan, or urinal?: A Lot Help from another person bathing (including washing, rinsing, drying)?: A Lot Help from another person to put on and taking off regular upper body clothing?: A Little Help from another person to put on and taking off regular lower body clothing?: Total 6 Click Score: 11    End of Session    OT Visit Diagnosis: Unsteadiness on feet (R26.81);Other abnormalities of gait and mobility (R26.89);Muscle weakness (generalized) (M62.81) Pain - Right/Left: ("all over") Pain - part of body: (all over)   Activity Tolerance Patient limited by pain;Patient limited by fatigue   Patient Left in chair;with call bell/phone within reach   Nurse Communication Mobility status        Time: 4360-6770 OT Time Calculation (min): 23 min  Charges: OT General Charges $OT Visit: 1 Visit OT Treatments $Self Care/Home Management : 23-37 mins  Emmitt Matthews MSOT,  OTR/L Acute Rehab Pager: [phone] Office: [phone]   Theodoro Grist Amire Leazer 02/18/2019, 4:01 PM

## 2019-02-18 NOTE — Progress Notes (Signed)
PHARMACY - ADULT TOTAL PARENTERAL NUTRITION CONSULT NOTE   Pharmacy Consult:  TPN Indication:  Prolonged ileus  Patient Measurements: Height: 6\' 5"  (195.6 cm) Weight: 201 lb 4.5 oz (91.3 kg) IBW/kg (Calculated) : 89.1 TPN AdjBW (KG): 84.4 Body mass index is 23.87 kg/m.  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  He presented on 01/28/19 for aorta bifemoral bypass graft, femoral endarterectomy and iliac thrombectomy.  Patient was started on a clear liquid diet post-op but intake was inadequate.  NG tube placed on 01/31/19 and patient was made NPO again.  He was started on clears on 02/05/19 and vomitted.  Pharmacy consulted to manage TPN for nutrition support.    Patient has aphasia and reports he eats primarily soup and was eating regularly up until the day of admission.  He has minimal to no intake for ~10 days and is at risk for refeeding.  GI: Has persistent ileus- may be resolving as LBM 3/2 after suppository on 3/1, Flatus noted as well. Last abdominal Xray on 2/25 showed improvement in ileus. Prealbumin improved to 15.3, NG O/P down to 150 mL - plan to continue NGT for another 24 hours and reassess. PPI IV, PRN Zofran. Endo: no hx DM - CBGs controlled. No SSI required. Lytes: wnl today, K up to 4.1 (goal > 4 in ileus), Cl down to 107, HCO3 24. Phos/Mg wnl.  Renal: SCr back down to 1.25. BUN down to 12 - UOP  4.1 ml/kg/hr, D5NS at 20/hr, net -4.8L, foley 2/21 CT showed left hydronephrosis Pulm: stable on RA Cards: BP stable, in and out of Afib, currently SB in 50s- amiodarone gtt, off ASA. Cardiology re-consulted. AC: Heparin for Afib - H/H up, plts wnl.  2/21 CT showed RP hematoma Hepatobil: LFTs slightly elevated and trending up, Tbili wnl. Trig wnl. Neuro: PVD/CVA - PRN Dilaudid ID: Enterobacter aerogenes bacteremia sensitive to Zosyn day #6. Now afebrile, WBC wnl. PICC pulled 2/27 due to fever. CVC placed 2/28. Cath tip cx - intermediate to Zosyn.   TPN  Access: New CVC triple lumen placed on 2/28 TPN start date: 02/06/19  Nutritional Goals (RD rec 2/27): 2200-2400 kCal, 110-125g protein, >/= 2.2L fluid per day  Current Nutrition: NPO  TPN  Plan:  Continue TPN tonight at 6mL/hr This TPN provides 121g AA, 324g CHO and 69g ILE for a total of 2,275 kCal, meeting 100% of patient's needs. Electrolytes in TPN: max Na, Cl:Ac 1:2 Daily multivitamin and trace elements in TPN Continue D5NS at 17mL/hr per MD Monitor TPN labs  Consider antibiotic change to Ceftriaxone with cath tip culture showing intermediate susceptibility to Zosyn, but sensitive to Ceftriaxone.   Link Snuffer, PharmD, BCPS, BCCCP Clinical Pharmacist Please refer to [facility] for [facility] Pharmacy numbers 02/18/2019 7:24 AM

## 2019-02-18 NOTE — Progress Notes (Signed)
There is a discordant sensitivity results on the enterobacter in the blood and cath tip. Lab will redo sens. For the time being, we will change zosyn to cefepime since it's sens to it. He is on D6 of total abx now.   Dc zosyn Cefepime 2g IV q8  Ulyses Southward, PharmD, [place], AAHIVP, CPP Infectious Disease Pharmacist 02/18/2019 12:15 PM

## 2019-02-18 NOTE — Progress Notes (Signed)
Progress Note  Patient Name: [name] [name] Date of Encounter: 02/18/2019  Primary Cardiologist:   Rollene Rotunda, MD   Subjective   He reports that he is feeling well.  He denies pain or SOB.    Inpatient Medications    Scheduled Meds: . Chlorhexidine Gluconate Cloth  6 each Topical Daily  . mouth rinse  15 mL Mouth Rinse BID  . pantoprazole (PROTONIX) IV  40 mg Intravenous Q24H  . sodium chloride flush  10-40 mL Intracatheter Q12H   Continuous Infusions: . amiodarone Stopped (02/18/19 0125)  . dextrose 5 % and 0.9% NaCl 20 mL/hr at 02/17/19 0951  . heparin 1,650 Units/hr (02/18/19 0848)  . piperacillin-tazobactam (ZOSYN)  IV 3.375 g (02/18/19 0522)  . TPN ADULT (ION) 90 mL/hr at 02/17/19 1723  . TPN ADULT (ION)     PRN Meds: acetaminophen **OR** acetaminophen, bisacodyl, guaiFENesin-dextromethorphan, hydrALAZINE, HYDROmorphone (DILAUDID) injection, labetalol, lip balm, ondansetron, phenol, sodium chloride flush   Vital Signs    Vitals:   02/18/19 0549 02/18/19 0551 02/18/19 0602 02/18/19 0800  BP: 138/79 138/79  131/78  Pulse: (!) 57 (!) 58  (!) 56  Resp: (!) 22 17  18   Temp: 97.8 F (36.6 C)   97.6 F (36.4 C)  TempSrc: Oral   Oral  SpO2: 98% 96%  98%  Weight:   91.3 kg   Height:        Intake/Output Summary (Last 24 hours) at 02/18/2019 1013 Last data filed at 02/18/2019 0813 Gross per 24 hour  Intake 4222.29 ml  Output 7850 ml  Net -3627.71 ml   Filed Weights   02/06/19 1219 02/15/19 0922 02/18/19 0602  Weight: 89.2 kg 92.7 kg 91.3 kg    Telemetry    NSR, SB - Personally Reviewed  ECG    NA - Personally Reviewed  Physical Exam   GEN: No acute distress.   Neck: No  JVD Cardiac: RRR, no murmurs, rubs, or gallops.  Respiratory: Clear no to auscultation bilaterally. GI: Soft, nontender, non-distended  MS:   Mild edema; No deformity. Neuro:  Nonfocal  Psych: Normal affect   Labs    Chemistry Recent Labs  Lab 02/14/19 0249   02/16/19 0430 02/17/19 0352 02/18/19 0331  NA 136   < > 137 137 135  K 3.9   < > 4.1 4.1 4.1  CL 109   < > 109 108 107  CO2 21*   < > 21* 24 24  GLUCOSE 118*   < > 128* 111* 119*  BUN 20   < > 12 13 12   CREATININE 1.51*   < > 1.26* 1.30* 1.25*  CALCIUM 6.9*   < > 7.4* 7.7* 7.9*  PROT 5.3*  --  5.5*  --  6.0*  ALBUMIN 1.6*  --  1.7*  --  1.9*  AST 51*  --  42*  --  62*  ALT 52*  --  46*  --  66*  ALKPHOS 79  --  80  --  94  BILITOT 2.3*  --  1.0  --  1.1  GFRNONAA 46*   < > 58* 56* 58*  GFRAA 54*   < > >60 >60 >60  ANIONGAP 6   < > 7 5 4*   < > = values in this interval not displayed.     Hematology Recent Labs  Lab 02/16/19 0430 02/17/19 0352 02/18/19 0331  WBC 8.3 10.0 13.1*  RBC 2.61* 2.70* 2.79*  HGB 7.6*  7.8* 8.2*  HCT 24.4* 25.5* 25.9*  MCV 93.5 94.4 92.8  MCH 29.1 28.9 29.4  MCHC 31.1 30.6 31.7  RDW 16.0* 16.2* 15.9*  PLT 231 267 300    Cardiac EnzymesNo results for input(s): TROPONINI in the last 168 hours. No results for input(s): TROPIPOC in the last 168 hours.   BNPNo results for input(s): BNP, PROBNP in the last 168 hours.   DDimer No results for input(s): DDIMER in the last 168 hours.   Radiology    Dg Abd 1 View  Result Date: 02/17/2019 CLINICAL DATA:  Abdominal pain EXAM: ABDOMEN - 1 VIEW COMPARISON:  02/15/2019 FINDINGS: Scattered large and small bowel gas is noted. Contrast material is noted within the right colon consistent with that administered for previous CT. No obstructive changes are noted. No free air is seen. No abnormal mass is noted. Degenerative changes of the lumbar spine are noted. IMPRESSION: No acute abnormality noted. Electronically Signed   By: Alcide Clever M.D.   On: 02/17/2019 15:05    Cardiac Studies   ECHO:   1. The left ventricle has normal systolic function with an ejection fraction of 60-65%. The cavity size was normal. There is moderately increased left ventricular wall thickness. Left ventricular diastology could not  be evaluated secondary to atrial  fibrillation.  2. The right ventricle has normal systolic function. The cavity was normal. There is no increase in right ventricular wall thickness.  3. The mitral valve is normal in structure.  4. The tricuspid valve is normal in structure.  5. Probably trileaflet Mild thickening of the aortic valve Moderate calcification of the aortic valve. no stenosis of the aortic valve.  6. The pulmonic valve was normal in structure.  7. 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  8. The ascending aorta is dilated at 63mm.  9. Right atrial pressure is estimated at 3 mmHg.   Patient Profile     68 y.o. male with a hx of atrial fibrillation and tachy-brady syndrome who has refused PPM in the past, chronic anticoagulation therapy w/ Coumadin, h/o CVA,  CKD and PVD admitted for elective Bi-Femoral bypass, who is being seen for the evaluation of atrial fibrillation w/ RVR at the request of Dr. Darrick Penna, Vascular Surgery.   Assessment & Plan    ATRIAL FIB WITH RVR:    NSR with bradycardia.    IV amio stopped last night secondary to bradycardia with rate in the 30s.  OK to leave off for now.  Not taking POs yet.  We can resume if he has recurrent fib.    AKI:   Creat mildly elevated but stable.    DILATED ASCENDING AO:  Follow over time.  For questions or updates, please contact [facility] Please consult [email or web address] for contact info under Cardiology/STEMI.   Signed, Rollene Rotunda, MD  02/18/2019, 10:13 AM

## 2019-02-18 NOTE — Care Management Important Message (Signed)
Important Message  Patient Details  Name: [name] [name] MRN: [id number] Date of Birth: 1951-10-22   Medicare Important Message Given:  Yes    Dionis Autry P Ardith Lewman 02/18/2019, 1:43 PM

## 2019-02-18 NOTE — Progress Notes (Signed)
[facility] [place]  Subjective  - no pain, had flatus and BM after suppository yesterday   Objective 138/79 (!) 58 97.8 F (36.6 C) (Oral) 17 96%  Intake/Output Summary (Last 24 hours) at 02/18/2019 0757 Last data filed at 02/18/2019 0522 Gross per 24 hour  Intake 4242.29 ml  Output 7750 ml  Net -3507.71 ml   Abdomen soft non tender All incisions healing  Assessment/Planning: AFib on amiodarone HR into 30s last pm will have cardiology revisit Continue heparin  Prolonged ileus ? Resolving.  Continue NG tube for now and see how he does next 24 hr. Continue TPN.  Renal failure.  Continue foley while has NG tube to make sure we are keeping up with loss.  Do not remove foley.  PAD patent aortobifem  Continue to ambulate daily  Mild leukocytosis trend for now continue zosyn for full 10 days to treat ecoli enterobacter from urine blood.  Reculture if becomes febrile  Acute blood loss anemia and chronic anemia stable over last 10 days.    [name] [name] 02/18/2019 7:57 AM --  Laboratory Lab Results: Recent Labs    02/17/19 0352 02/18/19 0331  WBC 10.0 13.1*  HGB 7.8* 8.2*  HCT 25.5* 25.9*  PLT 267 300   BMET Recent Labs    02/17/19 0352 02/18/19 0331  NA 137 135  K 4.1 4.1  CL 108 107  CO2 24 24  GLUCOSE 111* 119*  BUN 13 12  CREATININE 1.30* 1.25*  CALCIUM 7.7* 7.9*    COAG Lab Results  Component Value Date   INR 1.3 (H) 02/13/2019   INR 1.3 (H) 02/12/2019   INR 1.28 02/11/2019   No results found for: PTT

## 2019-02-18 NOTE — Progress Notes (Signed)
ANTICOAGULATION CONSULT NOTE - Follow Up Consult  Pharmacy Consult for Heparin Indication: atrial fibrillation  No Known Allergies  Patient Measurements: Height: 6\' 5"  (195.6 cm) Weight: 201 lb 4.5 oz (91.3 kg) IBW/kg (Calculated) : 89.1 Heparin Dosing Weight: 84 kg  Vital Signs: Temp: 97.6 F (36.4 C) (03/02 0800) Temp Source: Oral (03/02 0800) BP: 131/78 (03/02 0800) Pulse Rate: 56 (03/02 0800)  Labs: Recent Labs    02/16/19 0430 02/16/19 0431 02/17/19 0352 02/18/19 0330 02/18/19 0331  HGB 7.6*  --  7.8*  --  8.2*  HCT 24.4*  --  25.5*  --  25.9*  PLT 231  --  267  --  300  HEPARINUNFRC  --  0.50 0.36 0.40  --   CREATININE 1.26*  --  1.30*  --  1.25*    Estimated Creatinine Clearance: 70.3 mL/min (A) (by C-G formula based on SCr of 1.25 mg/dL (H)).  Assessment: 18 yoM s/p aortabifem bypass on heparin infusion. Pt on warfarin PTA for hx of AFib, last dose was 2/12 which is now on hold due to ileus. Pharmacy consulted to resume heparin 2/14 with no bolus.  Heparin level continues to be at goal this morning. H/H low stable. Plt wnl. Continue holding warfarin until able to take po's.   Goal of Therapy:  Heparin level 0.3-0.5 units/ml Monitor platelets by anticoagulation protocol: Yes   Plan:  -Continue heparin at 1650 units/hr -Holding warfarin for now, will resume once tolerating liquids -Daily heparin level, CBC and INR  Sheppard Coil PharmD., BCPS Clinical Pharmacist 02/18/2019 9:53 AM  Clinical phone for 02/17/19 until 3:30pm: [phone] If after 3:30pm, please refer to [facility] for unit-specific pharmacist

## 2019-02-19 ENCOUNTER — Ambulatory Visit: Payer: Medicare HMO | Admitting: Urology

## 2019-02-19 DIAGNOSIS — I483 Typical atrial flutter: Secondary | ICD-10-CM

## 2019-02-19 LAB — BASIC METABOLIC PANEL
Anion gap: 8 (ref 5–15)
BUN: 15 mg/dL (ref 8–23)
CALCIUM: 8.4 mg/dL — AB (ref 8.9–10.3)
CO2: 23 mmol/L (ref 22–32)
Chloride: 105 mmol/L (ref 98–111)
Creatinine, Ser: 1.31 mg/dL — ABNORMAL HIGH (ref 0.61–1.24)
GFR calc Af Amer: >60 mL/min (ref >60)
GFR calc non Af Amer: 55 mL/min — ABNORMAL LOW (ref >60)
Glucose, Bld: 110 mg/dL — ABNORMAL HIGH (ref 70–99)
Potassium: 4 mmol/L (ref 3.5–5.1)
SODIUM: 136 mmol/L (ref 135–145)

## 2019-02-19 LAB — CBC
HCT: 29 % — ABNORMAL LOW (ref 39.0–52.0)
Hemoglobin: 9 g/dL — ABNORMAL LOW (ref 13.0–17.0)
MCH: 29.4 pg (ref 26.0–34.0)
MCHC: 31 g/dL (ref 30.0–36.0)
MCV: 94.8 fL (ref 80.0–100.0)
PLATELETS: 343 10*3/uL (ref 150–400)
RBC: 3.06 MIL/uL — ABNORMAL LOW (ref 4.22–5.81)
RDW: 17 % — ABNORMAL HIGH (ref 11.5–15.5)
WBC: 15.6 10*3/uL — ABNORMAL HIGH (ref 4.0–10.5)
nRBC: 0 % (ref 0.0–0.2)

## 2019-02-19 LAB — HEPARIN LEVEL (UNFRACTIONATED): Heparin Unfractionated: 0.46 IU/mL (ref 0.30–0.70)

## 2019-02-19 LAB — GLUCOSE, CAPILLARY
GLUCOSE-CAPILLARY: 111 mg/dL — AB (ref 70–99)
Glucose-Capillary: 108 mg/dL — ABNORMAL HIGH (ref 70–99)

## 2019-02-19 MED ORDER — TRAVASOL 10 % IV SOLN
INTRAVENOUS | Status: AC
  Administered 2019-02-19: 18:00:00 via INTRAVENOUS
  Filled 2019-02-19: qty 1209.6

## 2019-02-19 NOTE — Progress Notes (Signed)
PHARMACY - ADULT TOTAL PARENTERAL NUTRITION CONSULT NOTE   Pharmacy Consult:  TPN Indication:  Prolonged ileus  Patient Measurements: Height: 6\' 5"  (195.6 cm) Weight: 201 lb 4.5 oz (91.3 kg) IBW/kg (Calculated) : 89.1 TPN AdjBW (KG): 84.4 Body mass index is 23.87 kg/m.  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  He presented on 01/28/19 for aorta bifemoral bypass graft, femoral endarterectomy and iliac thrombectomy.  Patient was started on a clear liquid diet post-op but intake was inadequate.  NG tube placed on 01/31/19 and patient was made NPO again.  He was started on clears on 02/05/19 and vomitted.  Pharmacy consulted to manage TPN for nutrition support.    Patient has aphasia and reports he eats primarily soup and was eating regularly up until the day of admission.  He has minimal to no intake for ~10 days and is at risk for refeeding.  GI: Has persistent ileus- may be resolving as LBM 3/2 after suppository on 3/1, Flatus noted as well. Last abdominal Xray on 2/25 showed improvement in ileus. Prealbumin improved to 15.3, NG O/P down to 150 mL - plan to continue NGT for another 24 hours and reassess. PPI IV, PRN Zofran. Endo: no hx DM - CBGs controlled. No SSI required. Lytes: wnl today, K 4 (goal > 4 in ileus) Phos/Mg wnl.  Renal: SCr 1.31, D5NS at 20/hr, net -4 L, foley 2/21 CT showed left hydronephrosis Pulm: stable on RA Cards: BP stable, in and out of Afib, currently SB in 50s- amiodarone gtt, off ASA. Cardiology re-consulted. AC: Heparin for Afib - H/H up, plts wnl.  2/21 CT showed RP hematoma Hepatobil: LFTs slightly elevated and trending up, Tbili wnl. Trig wnl. Neuro: PVD/CVA - PRN Dilaudid ID: Enterobacter aerogenes bacteremia sensitive to Zosyn day #6. Now afebrile, WBC wnl. PICC pulled 2/27 due to fever. CVC placed 2/28. Cath tip cx - intermediate to Zosyn.  Due to conflicting sensitivities, changed to cefepime 3/2  TPN Access: New CVC triple  lumen placed on 2/28 TPN start date: 02/06/19  Nutritional Goals (RD rec 2/27): 2200-2400 kCal, 110-125g protein, >/= 2.2L fluid per day  Current Nutrition: NPO  TPN  Plan:  Continue TPN tonight at 10mL/hr This TPN provides 121g AA, 324g CHO and 69g ILE for a total of 2,275 kCal, meeting 100% of patient's needs. Electrolytes in TPN: max Na, Cl:Ac 1:2 Daily multivitamin and trace elements in TPN Continue D5NS at 55mL/hr per MD Monitor TPN labs  Isaac Bliss, PharmD, BCPS, BCCCP Clinical Pharmacist [phone]  Please check AMION for all [facility] Pharmacy numbers  02/19/2019 8:24 AM

## 2019-02-19 NOTE — Progress Notes (Addendum)
NGT clamped as ordered, patient tolerated it well, no c/o of nausea or vomiting while tube was calamped on this shift. Total output of 10cc recorded from when tube was first clamped 1108 to 1900.

## 2019-02-19 NOTE — Progress Notes (Signed)
Progress Note  Patient Name: [name] [name] Date of Encounter: 02/19/2019  Primary Cardiologist:   Rollene Rotunda, MD   Subjective   He denies any pain or SOB.  No distress  Inpatient Medications    Scheduled Meds: . Chlorhexidine Gluconate Cloth  6 each Topical Daily  . mouth rinse  15 mL Mouth Rinse BID  . pantoprazole (PROTONIX) IV  40 mg Intravenous Q24H  . sodium chloride flush  10-40 mL Intracatheter Q12H   Continuous Infusions: . amiodarone Stopped (02/18/19 0125)  . ceFEPime (MAXIPIME) IV 2 g (02/19/19 0519)  . dextrose 5 % and 0.9% NaCl 20 mL/hr at 02/18/19 2206  . heparin 1,650 Units/hr (02/19/19 0032)  . TPN ADULT (ION) 90 mL/hr at 02/18/19 2206  . TPN ADULT (ION)     PRN Meds: acetaminophen **OR** acetaminophen, bisacodyl, guaiFENesin-dextromethorphan, hydrALAZINE, HYDROmorphone (DILAUDID) injection, labetalol, lip balm, ondansetron, phenol, sodium chloride flush   Vital Signs    Vitals:   02/19/19 0600 02/19/19 0700 02/19/19 0742 02/19/19 0900  BP: 116/87  124/81   Pulse: 88 90 91 94  Resp: 20 14 20 16   Temp:   97.9 F (36.6 C)   TempSrc:   Oral   SpO2: 99% 94% 94% 96%  Weight:      Height:        Intake/Output Summary (Last 24 hours) at 02/19/2019 1009 Last data filed at 02/19/2019 0949 Gross per 24 hour  Intake 3236.63 ml  Output 6500 ml  Net -3263.37 ml   Filed Weights   02/06/19 1219 02/15/19 0922 02/18/19 0602  Weight: 89.2 kg 92.7 kg 91.3 kg    Telemetry    Atrial flutter with variable conduction.   - Personally Reviewed  ECG    NA - Personally Reviewed  Physical Exam   GEN: No  acute distress.   Neck: No  JVD Cardiac: Irregular RR, no murmurs, rubs, or gallops.  Respiratory: Clear to auscultation bilaterally. GI: Soft, nontender, diminished bowel sounds MS:  Mild ankle edema; No deformity. Neuro:   Nonfocal  Psych: Oriented and appropriate    Labs    Chemistry Recent Labs  Lab 02/14/19 0249  02/16/19 0430  02/17/19 0352 02/18/19 0331 02/19/19 0416  NA 136   < > 137 137 135 136  K 3.9   < > 4.1 4.1 4.1 4.0  CL 109   < > 109 108 107 105  CO2 21*   < > 21* 24 24 23   GLUCOSE 118*   < > 128* 111* 119* 110*  BUN 20   < > 12 13 12 15   CREATININE 1.51*   < > 1.26* 1.30* 1.25* 1.31*  CALCIUM 6.9*   < > 7.4* 7.7* 7.9* 8.4*  PROT 5.3*  --  5.5*  --  6.0*  --   ALBUMIN 1.6*  --  1.7*  --  1.9*  --   AST 51*  --  42*  --  62*  --   ALT 52*  --  46*  --  66*  --   ALKPHOS 79  --  80  --  94  --   BILITOT 2.3*  --  1.0  --  1.1  --   GFRNONAA 46*   < > 58* 56* 58* 55*  GFRAA 54*   < > >60 >60 >60 >60  ANIONGAP 6   < > 7 5 4* 8   < > = values in this interval not displayed.  Hematology Recent Labs  Lab 02/17/19 0352 02/18/19 0331 02/19/19 0416  WBC 10.0 13.1* 15.6*  RBC 2.70* 2.79* 3.06*  HGB 7.8* 8.2* 9.0*  HCT 25.5* 25.9* 29.0*  MCV 94.4 92.8 94.8  MCH 28.9 29.4 29.4  MCHC 30.6 31.7 31.0  RDW 16.2* 15.9* 17.0*  PLT 267 300 343    Cardiac EnzymesNo results for input(s): TROPONINI in the last 168 hours. No results for input(s): TROPIPOC in the last 168 hours.   BNPNo results for input(s): BNP, PROBNP in the last 168 hours.   DDimer No results for input(s): DDIMER in the last 168 hours.   Radiology    Dg Abd 1 View  Result Date: 02/17/2019 CLINICAL DATA:  Abdominal pain EXAM: ABDOMEN - 1 VIEW COMPARISON:  02/15/2019 FINDINGS: Scattered large and small bowel gas is noted. Contrast material is noted within the right colon consistent with that administered for previous CT. No obstructive changes are noted. No free air is seen. No abnormal mass is noted. Degenerative changes of the lumbar spine are noted. IMPRESSION: No acute abnormality noted. Electronically Signed   By: Alcide Clever M.D.   On: 02/17/2019 15:05    Cardiac Studies   ECHO:   1. The left ventricle has normal systolic function with an ejection fraction of 60-65%. The cavity size was normal. There is moderately  increased left ventricular wall thickness. Left ventricular diastology could not be evaluated secondary to atrial  fibrillation.  2. The right ventricle has normal systolic function. The cavity was normal. There is no increase in right ventricular wall thickness.  3. The mitral valve is normal in structure.  4. The tricuspid valve is normal in structure.  5. Probably trileaflet Mild thickening of the aortic valve Moderate calcification of the aortic valve. no stenosis of the aortic valve.  6. The pulmonic valve was normal in structure.  7. 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  8. The ascending aorta is dilated at 45mm.  9. Right atrial pressure is estimated at 3 mmHg.   Patient Profile     68 y.o. male with a hx of atrial fibrillation and tachy-brady syndrome who has refused PPM in the past, chronic anticoagulation therapy w/ Coumadin, h/o CVA,  CKD and PVD admitted for elective Bi-Femoral bypass, who is being seen for the evaluation of atrial fibrillation w/ RVR at the request of Dr. Darrick Penna, Vascular Surgery.   Assessment & Plan    ATRIAL FIB WITH RVR:    Atrial flutter with variable block and occasional sinus beats.   Amio was stopped secondary to bradycardia and pauses.  For now I will leave him off of this.  His rate is OK.  If he has increased rates working with BP might need to resume to try to maintain NSR.   AKI:   Mildly elevated but stable.  No change in therapy.    DILATED ASCENDING AO:   We will follow up in the future.     For questions or updates, please contact [facility] Please consult [email or web address] for contact info under Cardiology/STEMI.   Signed, Rollene Rotunda, MD  02/19/2019, 10:09 AM

## 2019-02-19 NOTE — Progress Notes (Signed)
  Progress Note    02/19/2019 8:07 AM 4 Days Post-Op  Subjective:  No abd pain; patient denies BM or flatus   Vitals:   02/19/19 0600 02/19/19 0742  BP: 116/87 124/81  Pulse: 88 91  Resp: 20 20  Temp:  97.9 F (36.6 C)  SpO2: 99% 94%   Physical Exam: Lungs:  Non labored Incisions:  Groin and abd incisions mostly healed Extremities:  Feet warm to touch with good cap refill Abdomen:  Tender to deep palpation; soft, possible palpable stool burden LLQ Neurologic: A&O  CBC    Component Value Date/Time   WBC 15.6 (H) 02/19/2019 0416   RBC 3.06 (L) 02/19/2019 0416   HGB 9.0 (L) 02/19/2019 0416   HGB 15.7 09/27/2017 0930   HCT 29.0 (L) 02/19/2019 0416   HCT 46.8 09/27/2017 0930   PLT 343 02/19/2019 0416   PLT 275 09/27/2017 0930   MCV 94.8 02/19/2019 0416   MCV 94 09/27/2017 0930   MCH 29.4 02/19/2019 0416   MCHC 31.0 02/19/2019 0416   RDW 17.0 (H) 02/19/2019 0416   RDW 14.8 09/27/2017 0930   LYMPHSABS 1.6 02/18/2019 0331   MONOABS 0.7 02/18/2019 0331   EOSABS 0.7 (H) 02/18/2019 0331   BASOSABS 0.1 02/18/2019 0331    BMET    Component Value Date/Time   NA 136 02/19/2019 0416   NA 140 09/27/2017 0930   K 4.0 02/19/2019 0416   CL 105 02/19/2019 0416   CO2 23 02/19/2019 0416   GLUCOSE 110 (H) 02/19/2019 0416   BUN 15 02/19/2019 0416   BUN 14 09/27/2017 0930   CREATININE 1.31 (H) 02/19/2019 0416   CALCIUM 8.4 (L) 02/19/2019 0416   GFRNONAA 55 (L) 02/19/2019 0416   GFRAA >60 02/19/2019 0416    INR    Component Value Date/Time   INR 1.3 (H) 02/13/2019 0402     Intake/Output Summary (Last 24 hours) at 02/19/2019 3810 Last data filed at 02/19/2019 1751 Gross per 24 hour  Intake 3236.63 ml  Output 7300 ml  Net -4063.37 ml     Assessment/Plan:  69 y.o. male is s/p Aortobifemoral bypass with bilateral common femoral endarterectomy and profundoplasty, bilateral iliac thrombectomy  4 Days Post-Op   Appreciate Cardiology input for bradycardia, Afib Perfusing  BLE well No BM or flatus per patient; continue NG; another 30cc output overnight Leukocytosis; upward trend; continue Zosyn; blood re-cultured ARF; continue foley   Emilie Rutter, PA-C [facility] [phone] 02/19/2019 8:07 AM

## 2019-02-19 NOTE — Progress Notes (Signed)
CCMD called to notify Pt had 2.72 seconds paused on EKG, Pt was sleeping, no acute distress had seen. HR was mostly 80-90s, BP stable, Atrial fibrillation on monitor. Continue to observe.  Zackery Barefoot P[facility]N-[facility]

## 2019-02-19 NOTE — Progress Notes (Signed)
CCMD called to notify EKG turned to Atrial fib and Atrial flutter, HR 80s, BP 99/58 -123/78  MmHg, Pt was sleeping comfortably, no acute distress had seen. Continue to monitor.  [name], BSN,RN,P[facility]N-[facility]

## 2019-02-19 NOTE — Progress Notes (Signed)
[facility] [place]  Subjective  - feels ok no flatus   Objective 124/81 94 97.9 F (36.6 C) (Oral) 16 96%  Intake/Output Summary (Last 24 hours) at 02/19/2019 1245 Last data filed at 02/19/2019 1117 Gross per 24 hour  Intake 2477.83 ml  Output 6350 ml  Net -3872.17 ml   Abdomen soft  Incisions healing Feet warm  Assessment/Planning: NG still bilious Will try clamping q4h today  Afib per cards continue heparin    [name] 02/19/2019 12:45 PM --  Laboratory Lab Results: Recent Labs    02/18/19 0331 02/19/19 0416  WBC 13.1* 15.6*  HGB 8.2* 9.0*  HCT 25.9* 29.0*  PLT 300 343   BMET Recent Labs    02/18/19 0331 02/19/19 0416  NA 135 136  K 4.1 4.0  CL 107 105  CO2 24 23  GLUCOSE 119* 110*  BUN 12 15  CREATININE 1.25* 1.31*  CALCIUM 7.9* 8.4*    COAG Lab Results  Component Value Date   INR 1.3 (H) 02/13/2019   INR 1.3 (H) 02/12/2019   INR 1.28 02/11/2019   No results found for: PTT

## 2019-02-19 NOTE — Progress Notes (Signed)
ANTICOAGULATION CONSULT NOTE - Follow Up Consult  Pharmacy Consult for Heparin Indication: atrial fibrillation  No Known Allergies  Patient Measurements: Height: 6\' 5"  (195.6 cm) Weight: 201 lb 4.5 oz (91.3 kg) IBW/kg (Calculated) : 89.1 Heparin Dosing Weight: 91.3 kg  Vital Signs: Temp: 97.9 F (36.6 C) (03/03 0742) Temp Source: Oral (03/03 0742) BP: 124/81 (03/03 0742) Pulse Rate: 91 (03/03 0742)  Labs: Recent Labs    02/17/19 0352 02/18/19 0330 02/18/19 0331 02/19/19 0415 02/19/19 0416  HGB 7.8*  --  8.2*  --  9.0*  HCT 25.5*  --  25.9*  --  29.0*  PLT 267  --  300  --  343  HEPARINUNFRC 0.36 0.40  --  0.46  --   CREATININE 1.30*  --  1.25*  --  1.31*    Estimated Creatinine Clearance: 67.1 mL/min (A) (by C-G formula based on SCr of 1.31 mg/dL (H)).  Assessment:  Anticoag: chronic Coumadin for afib, bridged with Lovenox PTA. S/p s/p 2/10 Aortobifemoral bypass with bilateral common femoral endarterectomy and profundoplasty, bilateral iliac thrombectomy. Heparin level 0.46 in goal. Hgb 9 up. Plts 343 WNL. - Warfarin home dose: 1.5mg  daily, last dose 2/12 (LD 2/12)  Goal of Therapy:  Heparin level 0.3-0.7 units/ml Monitor platelets by anticoagulation protocol: Yes   Plan:  Continue IV heparin at 1650 units/hr. Daily HL and CBC Transition back to Coumadin when able to tolerate orals  Shenika Quint S. Merilynn Finland, PharmD, BCPS Clinical Staff Pharmacist [email or web address] Merilynn Finland, [place] Hoban Stillinger 02/19/2019,9:12 AM

## 2019-02-20 LAB — BASIC METABOLIC PANEL
Anion gap: 6 (ref 5–15)
BUN: 17 mg/dL (ref 8–23)
CO2: 24 mmol/L (ref 22–32)
Calcium: 8.2 mg/dL — ABNORMAL LOW (ref 8.9–10.3)
Chloride: 105 mmol/L (ref 98–111)
Creatinine, Ser: 1.25 mg/dL — ABNORMAL HIGH (ref 0.61–1.24)
GFR calc Af Amer: >60 mL/min (ref >60)
GFR calc non Af Amer: 58 mL/min — ABNORMAL LOW (ref >60)
Glucose, Bld: 114 mg/dL — ABNORMAL HIGH (ref 70–99)
Potassium: 4.3 mmol/L (ref 3.5–5.1)
SODIUM: 135 mmol/L (ref 135–145)

## 2019-02-20 LAB — GLUCOSE, CAPILLARY: Glucose-Capillary: 110 mg/dL — ABNORMAL HIGH (ref 70–99)

## 2019-02-20 LAB — HEPARIN LEVEL (UNFRACTIONATED): Heparin Unfractionated: 0.42 IU/mL (ref 0.30–0.70)

## 2019-02-20 MED ORDER — TRAVASOL 10 % IV SOLN
INTRAVENOUS | Status: AC
  Administered 2019-02-20: 18:00:00 via INTRAVENOUS
  Filled 2019-02-20: qty 1209.6

## 2019-02-20 NOTE — Progress Notes (Signed)
ANTICOAGULATION CONSULT NOTE - Follow Up Consult  Pharmacy Consult for Heparin Indication: atrial fibrillation  No Known Allergies  Patient Measurements: Height: 6\' 5"  (195.6 cm) Weight: 201 lb 4.5 oz (91.3 kg) IBW/kg (Calculated) : 89.1 Heparin Dosing Weight: 91.3 kg  Vital Signs: Temp: 98.2 F (36.8 C) (03/04 0412) Temp Source: Oral (03/04 0412) BP: 130/82 (03/04 0412) Pulse Rate: 59 (03/04 0412)  Labs: Recent Labs    02/18/19 0330 02/18/19 0331 02/19/19 0415 02/19/19 0416 02/20/19 0600 02/20/19 0737  HGB  --  8.2*  --  9.0*  --   --   HCT  --  25.9*  --  29.0*  --   --   PLT  --  300  --  343  --   --   HEPARINUNFRC 0.40  --  0.46  --  0.42  --   CREATININE  --  1.25*  --  1.31*  --  1.25*    Estimated Creatinine Clearance: 70.3 mL/min (A) (by C-G formula based on SCr of 1.25 mg/dL (H)).  Assessment:  Anticoag: chronic Coumadin for afib, bridged with Lovenox PTA. S/p s/p 2/10 Aortobifemoral bypass with bilateral common femoral endarterectomy and profundoplasty, bilateral iliac thrombectomy.Heparin level 0.42 in goal. Hgb 9 up. Plts 343 WNL. No CBC 3/4 - Warfarin home dose: 1.5mg  daily, last dose 2/12 (LD 2/12)  Goal of Therapy:  Heparin level 0.3-0.7 units/ml Monitor platelets by anticoagulation protocol: Yes   Plan:  Continue IV heparin at 1650 units/hr. Daily HL and CBC Transition back to Coumadin when able to tolerate orals  Minta Fair S. Merilynn Finland, PharmD, BCPS Clinical Staff Pharmacist [email or web address] Merilynn Finland, [employer] 02/20/2019,9:27 AM

## 2019-02-20 NOTE — Progress Notes (Signed)
Nutrition Follow Up  DOCUMENTATION CODES:   Non-severe (moderate) malnutrition in context of acute illness/injury  INTERVENTION:    Advance diet as medically appropriate   TPN per pharmacy  NUTRITION DIAGNOSIS:   Moderate Malnutrition related to acute illness (ileus) as evidenced by energy intake < or equal to 50% for > or equal to 5 days, mild muscle depletion, mild fat depletion, ongoing  GOAL:   Patient will meet greater than or equal to 90% of their needs, met  MONITOR:   Diet advancement, Supplement acceptance, I & O's, Skin, Weight trends, Labs  ASSESSMENT:   68 year old male who presented on 2/10 for aortobifemoral bypass with bilateral common femoral endarterectomy and profundoplasty, bilateral iliac thrombectomy. PMH significant for atrial fibrillation, tachybrady syndrome, prior CVA, CKD, and PVD.  2/12 clear liquids 2/13 transferred to ICU, NPO, NGT placed d/t post-op ileus  2/18 pharmacy consulted for TPN, not started 2/18 NGT removed, advanced to clear liquids 2/20 TPN started 2/26 TPN discontinued d/t line infection 2/28 s/p new IJ catheter insertion   VVS notes reviewed. Pt remains NPO; NGT removed. He is passing flatus; + BM this AM.  TPN currently at goal rate of 90 ml/hr. Provides 2275 kcals and 121 gm protein. Meets 100% of estimated nutritional needs.  Labs and medications reviewed. CBG's [id number].  Diet Order:   Diet Order            Diet NPO time specified  Diet effective now             EDUCATION NEEDS:   No education needs have been identified at this time  Skin:  Skin Assessment: Skin Integrity Issues: Incisions: closed incisions to R groin, L groin, abdomen  Last BM:  3/4  Height:   Ht Readings from Last 1 Encounters:  01/28/19 _0  (1.956 m)   Weight:   Wt Readings from Last 1 Encounters:  02/18/19 91.3 kg   Ideal Body Weight:  94.5 kg  BMI:  Body mass index is 23.87 kg/m.  Estimated Nutritional Needs:    Kcal:  2200-2400  Protein:  110-125 gm  Fluid:  >/= 2.2 L  Arthur Holms, RD, LDN Pager #: [phone] After-Hours Pager #: [phone]

## 2019-02-20 NOTE — Progress Notes (Signed)
PHARMACY - ADULT TOTAL PARENTERAL NUTRITION CONSULT NOTE   Pharmacy Consult:  TPN Indication:  Prolonged ileus  Patient Measurements: Height: 6\' 5"  (195.6 cm) Weight: 201 lb 4.5 oz (91.3 kg) IBW/kg (Calculated) : 89.1 TPN AdjBW (KG): 84.4 Body mass index is 23.87 kg/m.  Assessment:  56 YOM with history of LE weakness and CVA from AFib.  He presented on 01/28/19 for aorta bifemoral bypass graft, femoral endarterectomy and iliac thrombectomy.  Patient was started on a clear liquid diet post-op but intake was inadequate.  NG tube placed on 01/31/19 and patient was made NPO again.  He was started on clears on 02/05/19 and vomitted.  Pharmacy consulted to manage TPN for nutrition support.    Patient has aphasia and reports he eats primarily soup and was eating regularly up until the day of admission.  He has minimal to no intake for ~10 days and is at risk for refeeding.  GI: Has persistent ileus- BM charted on 3/2 and 3/3, received dulcolax suppository on 3/1. Last abdominal Xray on 3/1 showed scattered large and small bowel gas. Prealbumin improved to 15.3, NG O/P 60 mL - NGT clamped. PPI IV, PRN Zofran. Endo: no hx DM - CBGs controlled. No SSI required. Lytes: wnl today, K 4.3 (goal > 4 in ileus) Phos/Mg wnl.  Renal: SCr 1.25, D5NS at 20/hr, net -1.1 L, foley 2/21 CT showed left hydronephrosis Pulm: stable on RA Cards: BP stable, in and out of Afib, currently SB in 60s- amiodarone gtt, off ASA. Cardiology re-consulted. AC: Heparin for Afib - H/H improving.  2/21 CT showed RP hematoma Hepatobil: LFTs slightly elevated and trending up, Tbili wnl. Trig wnl. Neuro: PVD/CVA - PRN Dilaudid ID: Enterobacter aerogenes bacteremia sensitive cefepime. Antibiotic D#7. Now afebrile, WBC up trending. PICC pulled 2/27 due to fever. CVC placed 2/28. Cath tip cx - intermediate to Zosyn.  Due to conflicting sensitivities, changed to cefepime 3/2  TPN Access: New CVC triple lumen placed on 2/28 TPN start  date: 02/06/19  Nutritional Goals (RD rec 2/27): 2200-2400 kCal, 110-125g protein, >/= 2.2L fluid per day  Current Nutrition: NPO  TPN  Plan:  Continue TPN tonight at 45mL/hr This TPN provides 121g AA, 324g CHO and 69g ILE for a total of 2,275 kCal, meeting 100% of patient's needs. Electrolytes in TPN: max Na, Cl:Ac 1:2 Daily multivitamin and trace elements in TPN Continue D5NS at 76mL/hr per MD Monitor TPN labs May need to back off K in TPN tomorrow  Vascular to d/c NGT today   Vinnie Level, PharmD., BCPS Clinical Pharmacist Clinical phone for 02/20/19 until 3:30pm: [phone] If after 3:30pm, please refer to [facility] for unit-specific pharmacist

## 2019-02-20 NOTE — Progress Notes (Signed)
Patient in [name] [name]

## 2019-02-20 NOTE — Progress Notes (Signed)
Progress Note  Patient Name: [name] [name] Date of Encounter: 02/20/2019  Primary Cardiologist:   Rollene Rotunda, MD   Subjective   No pain.  No SOB.   Inpatient Medications    Scheduled Meds: . Chlorhexidine Gluconate Cloth  6 each Topical Daily  . mouth rinse  15 mL Mouth Rinse BID  . pantoprazole (PROTONIX) IV  40 mg Intravenous Q24H  . sodium chloride flush  10-40 mL Intracatheter Q12H   Continuous Infusions: . amiodarone Stopped (02/18/19 0125)  . ceFEPime (MAXIPIME) IV 2 g (02/20/19 0537)  . dextrose 5 % and 0.9% NaCl 20 mL/hr at 02/18/19 2206  . heparin 1,650 Units/hr (02/19/19 1740)  . TPN ADULT (ION) 90 mL/hr at 02/19/19 1821  . TPN ADULT (ION)     PRN Meds: acetaminophen **OR** acetaminophen, bisacodyl, guaiFENesin-dextromethorphan, hydrALAZINE, HYDROmorphone (DILAUDID) injection, labetalol, lip balm, ondansetron, phenol, sodium chloride flush   Vital Signs    Vitals:   02/19/19 1400 02/19/19 1939 02/20/19 0000 02/20/19 0412  BP:  135/81 133/82 130/82  Pulse:  84  (!) 59  Resp:  20 (!) 23 14  Temp: 97.7 F (36.5 C) 98 F (36.7 C)  98.2 F (36.8 C)  TempSrc: Oral Oral  Oral  SpO2:  98%  100%  Weight:      Height:        Intake/Output Summary (Last 24 hours) at 02/20/2019 0918 Last data filed at 02/20/2019 0913 Gross per 24 hour  Intake 3340.57 ml  Output 5420 ml  Net -2079.43 ml   Filed Weights   02/06/19 1219 02/15/19 0922 02/18/19 0602  Weight: 89.2 kg 92.7 kg 91.3 kg    Telemetry    NSR - Personally Reviewed  ECG    NA - Personally Reviewed  Physical Exam   GEN: No acute distress.   Neck: No  JVD Cardiac: RRR, NO murmurs, rubs, or gallops.  Respiratory: Clear  to auscultation bilaterally. GI: Soft, nontender, non-distended  MS: No  edema; No deformity. Neuro:  Right hemiparesis Psych: Normal affect   Labs    Chemistry Recent Labs  Lab 02/14/19 0249  02/16/19 0430  02/18/19 0331 02/19/19 0416 02/20/19 0737  NA 136    < > 137   < > 135 136 135  K 3.9   < > 4.1   < > 4.1 4.0 4.3  CL 109   < > 109   < > 107 105 105  CO2 21*   < > 21*   < > 24 23 24   GLUCOSE 118*   < > 128*   < > 119* 110* 114*  BUN 20   < > 12   < > 12 15 17   CREATININE 1.51*   < > 1.26*   < > 1.25* 1.31* 1.25*  CALCIUM 6.9*   < > 7.4*   < > 7.9* 8.4* 8.2*  PROT 5.3*  --  5.5*  --  6.0*  --   --   ALBUMIN 1.6*  --  1.7*  --  1.9*  --   --   AST 51*  --  42*  --  62*  --   --   ALT 52*  --  46*  --  66*  --   --   ALKPHOS 79  --  80  --  94  --   --   BILITOT 2.3*  --  1.0  --  1.1  --   --   [facility]  46*   < > 58*   < > 58* 55* 58*  GFRAA 54*   < > >60   < > >60 >60 >60  ANIONGAP 6   < > 7   < > 4* 8 6   < > = values in this interval not displayed.     Hematology Recent Labs  Lab 02/17/19 0352 02/18/19 0331 02/19/19 0416  WBC 10.0 13.1* 15.6*  RBC 2.70* 2.79* 3.06*  HGB 7.8* 8.2* 9.0*  HCT 25.5* 25.9* 29.0*  MCV 94.4 92.8 94.8  MCH 28.9 29.4 29.4  MCHC 30.6 31.7 31.0  RDW 16.2* 15.9* 17.0*  PLT 267 300 343    Cardiac EnzymesNo results for input(s): TROPONINI in the last 168 hours. No results for input(s): TROPIPOC in the last 168 hours.   BNPNo results for input(s): BNP, PROBNP in the last 168 hours.   DDimer No results for input(s): DDIMER in the last 168 hours.   Radiology    No results found.  Cardiac Studies    ECHO 2/20   1. The left ventricle has normal systolic function with an ejection fraction of 60-65%. The cavity size was normal. There is moderately increased left ventricular wall thickness. Left ventricular diastology could not be evaluated secondary to atrial  fibrillation.  2. The right ventricle has normal systolic function. The cavity was normal. There is no increase in right ventricular wall thickness.  3. The mitral valve is normal in structure.  4. The tricuspid valve is normal in structure.  5. Probably trileaflet Mild thickening of the aortic valve Moderate calcification of the aortic  valve. no stenosis of the aortic valve.  6. The pulmonic valve was normal in structure.  7. 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  8. The ascending aorta is dilated at 15mm.  9. Right atrial pressure is estimated at 3 mmHg.  Patient Profile     68 y.o. male with a hx of atrial fibrillation and tachy-brady syndrome who has refused PPM in the past, chronic anticoagulation therapy w/ Coumadin, h/o CVA, CKD and PVD admitted for elective Bi-Femoral bypass, who is being seen for the evaluation ofatrial fibrillation w/ RVRat the request of Dr. Darrick Penna, Vascular Surgery.  Assessment & Plan    PAF/FLUTTER:  Now in NSR.  Converted from flutter spontaneously yesterday.  On heparin.  Will need DOAC or warfarin long term.  OK to remain off of IV amiodarone.  Rate was well controlled when he was in flutter.    For questions or updates, please contact [facility] Please consult [email or web address] for contact info under Cardiology/STEMI.   Signed, Rollene Rotunda, MD  02/20/2019, 9:18 AM

## 2019-02-20 NOTE — Progress Notes (Addendum)
  Progress Note    02/20/2019 7:40 AM 5 Days Post-Op  Subjective:  Says he has been passing flatus and had a BM this morning.  Denies any nausea with clamping the tube yesterday.  Afebrile HR 50's-80's NSR/Afib 120's-130's systolic 94% RA  Vitals:   02/20/19 0000 02/20/19 0412  BP: 133/82 130/82  Pulse:  (!) 59  Resp: (!) 23 14  Temp:  98.2 F (36.8 C)  SpO2:  100%    Physical Exam: Cardiac:  regular Lungs:  Non labored Incisions:  Well healed. Extremities:  Bilateral feet are warm with pitting edema BLE Abdomen:  Soft, NT/ND with occasional BS  CBC    Component Value Date/Time   WBC 15.6 (H) 02/19/2019 0416   RBC 3.06 (L) 02/19/2019 0416   HGB 9.0 (L) 02/19/2019 0416   HGB 15.7 09/27/2017 0930   HCT 29.0 (L) 02/19/2019 0416   HCT 46.8 09/27/2017 0930   PLT 343 02/19/2019 0416   PLT 275 09/27/2017 0930   MCV 94.8 02/19/2019 0416   MCV 94 09/27/2017 0930   MCH 29.4 02/19/2019 0416   MCHC 31.0 02/19/2019 0416   RDW 17.0 (H) 02/19/2019 0416   RDW 14.8 09/27/2017 0930   LYMPHSABS 1.6 02/18/2019 0331   MONOABS 0.7 02/18/2019 0331   EOSABS 0.7 (H) 02/18/2019 0331   BASOSABS 0.1 02/18/2019 0331    BMET    Component Value Date/Time   NA 136 02/19/2019 0416   NA 140 09/27/2017 0930   K 4.0 02/19/2019 0416   CL 105 02/19/2019 0416   CO2 23 02/19/2019 0416   GLUCOSE 110 (H) 02/19/2019 0416   BUN 15 02/19/2019 0416   BUN 14 09/27/2017 0930   CREATININE 1.31 (H) 02/19/2019 0416   CALCIUM 8.4 (L) 02/19/2019 0416   GFRNONAA 55 (L) 02/19/2019 0416   GFRAA >60 02/19/2019 0416    INR    Component Value Date/Time   INR 1.3 (H) 02/13/2019 0402     Intake/Output Summary (Last 24 hours) at 02/20/2019 0740 Last data filed at 02/20/2019 7741 Gross per 24 hour  Intake 3340.57 ml  Output 4520 ml  Net -1179.43 ml     Assessment:  68 y.o. male is s/p:  Aortobifemoral bypass with bilateral common femoral endarterectomy and profundoplasty, bilateral iliac  thrombectomy  5 Days Post-Op  Plan: -pt without complaints this am.  Denies any nausea and states he is passing flatus and had BM this am.  NGT with recorded output of 170cc/24hr.  D/c NG but strict NPO -pitting edema BLE still present, but appears better than when I saw him last-pt with good uop.   -afib/nsr-amio off.  Rate appears control.  Appreciate cards assistance. -DVT prophylaxis:  Heparin gtt.  -mobilize and oob.   Doreatha Massed, PA-C [facility] [phone] 02/20/2019 7:40 AM  D/c NG tube and foley today Maintain strict I and O Continue TPN Incisions continue to heal  [name], MD [facility] [place] Office: [phone] Pager: [phone]

## 2019-02-20 NOTE — Progress Notes (Signed)
Physical Therapy Treatment Patient Details Name: [name] [name] MRN: [id number] DOB: [birth year] Today's Date: 02/20/2019    History of Present Illness Pt s/p Aortobifemoral bypass on 2/10. Received central line 2/28. Pt developed afib with rvr, acute renal failure and ileus 2/12-2/13. Pt had NG tube for ileus that was removed 2/17 and had to be replaced 2/19. 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     PT Comments    Patient seen for activity progression. Patient had a very positive session today with significant progression in mobility. Patient was able to mobilize in halls for the first time since initial presentation. Patient VSS throughout session and he remains motivated. Will continue to follow for progression.   Follow Up Recommendations  SNF;Supervision/Assistance - 24 hour     Equipment Recommendations  Other (comment)(TBD)    Recommendations for Other Services       Precautions / Restrictions Precautions Precautions: Fall;Other (comment) Precaution Comments: NG tube Restrictions Weight Bearing Restrictions: No    Mobility  Bed Mobility Overal bed mobility: Needs Assistance Bed Mobility: Supine to Sit     Supine to sit: Mod assist;HOB elevated     General bed mobility comments: Mod A for assist in elevatring trunk.   Transfers Overall transfer level: Needs assistance Equipment used: Rolling walker (2 wheeled) Transfers: Sit to/from [employer] Sit to Stand: Min assist         General transfer comment: Min assist to power up to standing with increased effort noted. VCs for positioning and upright posture  Ambulation/Gait Ambulation/Gait assistance: Min assist;+2 physical assistance;+2 safety/equipment Gait Distance (Feet): 80 Feet Assistive device: Rolling walker (2 wheeled) Gait Pattern/deviations: Shuffle;Step-to pattern;Decreased stride length;Narrow base of support Gait velocity:  decreased Gait velocity interpretation: <1.31 ft/sec, indicative of household ambulator General Gait Details: cues for upright posture, increased cadence and wider BOS. chair follow utilized   Social research officer, government Rankin (Stroke Patients Only)       Balance Overall balance assessment: Needs assistance Sitting-balance support: Single extremity supported Sitting balance-Leahy Scale: Fair     Standing balance support: Single extremity supported;During functional activity Standing balance-Leahy Scale: Poor Standing balance comment: Reliance on BUE support via RW                            Cognition Arousal/Alertness: Awake/alert Behavior During Therapy: WFL for tasks assessed/performed Overall Cognitive Status: Within Functional Limits for tasks assessed                                        Exercises      General Comments General comments (skin integrity, edema, etc.): VSS      Pertinent Vitals/Pain Pain Assessment: No/denies pain    Home Living                      Prior Function            PT Goals (current goals can now be found in the care plan section) Acute Rehab PT Goals Patient Stated Goal: get out of here PT Goal Formulation: With patient Time For Goal Achievement: 02/21/19 Potential to Achieve Goals: Good Progress towards PT goals: Progressing toward goals    Frequency  Min 3X/week      PT Plan Current plan remains appropriate;Frequency needs to be updated    Co-evaluation PT/OT/SLP Co-Evaluation/Treatment: Yes Reason for Co-Treatment: For patient/therapist safety PT goals addressed during session: Mobility/safety with mobility        AM-PAC PT "6 Clicks" Mobility   Outcome Measure  Help needed turning from your back to your side while in a flat bed without using bedrails?: None Help needed moving from lying on your back to sitting on the side of a flat bed  without using bedrails?: A Little Help needed moving to and from a bed to a chair (including a wheelchair)?: A Little Help needed standing up from a chair using your arms (e.g., wheelchair or bedside chair)?: A Little Help needed to walk in hospital room?: A Little Help needed climbing 3-5 steps with a railing? : Total 6 Click Score: 17    End of Session Equipment Utilized During Treatment: Gait belt Activity Tolerance: Patient tolerated treatment well Patient left: in chair;with call bell/phone within reach Nurse Communication: Mobility status PT Visit Diagnosis: Other abnormalities of gait and mobility (R26.89);Muscle weakness (generalized) (M62.81) Pain - part of body: (abdomen)     Time: 1245-8099 PT Time Calculation (min) (ACUTE ONLY): 25 min  Charges:  $Gait Training: 8-22 mins                     Charlotte Crumb, PT DPT  Board Certified Neurologic Specialist Acute Rehabilitation Services Pager [phone] Office [phone]    Fabio Asa 02/20/2019, 10:00 AM

## 2019-02-20 NOTE — Progress Notes (Signed)
NG and Foley removed this am. Patient informed to call for assistance to the bathroom. No N & V thus far. Cal bell and urinal within reach.

## 2019-02-20 NOTE — Progress Notes (Signed)
Pt smiling and readily willing to work with therapy. Pt without pain and demonstrating improved endurance. Pt continues to be appropriate for [name]. VSS throughout. NGT to be removed today.   02/20/19 1000  OT Visit Information  Last OT Received On 02/20/19  Assistance Needed +2 (for chair/lines)  PT/OT/SLP Co-Evaluation/Treatment Yes  Reason for Co-Treatment For patient/therapist safety  OT goals addressed during session ADL's and self-care  History of Present Illness Pt s/p Aortobifemoral bypass on 2/10. Received central line 2/28. Pt developed afib with rvr, acute renal failure and ileus 2/12-2/13. Pt had NG tube for ileus that was removed 2/17 and had to be replaced 2/19. 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   Precautions  Precautions Fall  Precaution Comments NG tube  Pain Assessment  Pain Assessment No/denies pain  Cognition  Arousal/Alertness Awake/alert  Behavior During Therapy WFL for tasks assessed/performed  Overall Cognitive Status Within Functional Limits for tasks assessed  Difficult to assess due to Impaired communication  Upper Extremity Assessment  RUE Deficits / Details +posturing R UE  RUE Coordination decreased fine motor;decreased gross motor  ADL  Overall ADL's  Needs assistance/impaired  Upper Body Dressing  Minimal assistance;Sitting  Upper Body Dressing Details (indicate cue type and reason) front opening gown  Lower Body Dressing Maximal assistance;Sitting/lateral leans  Lower Body Dressing Details (indicate cue type and reason) socks  Functional mobility during ADLs Minimal assistance;+2 for safety/equipment;Rolling walker  General ADL Comments pt eager to work with therapies today, feeling much better  Bed Mobility  Overal bed mobility Needs Assistance  Bed Mobility Supine to Sit  Supine to sit Mod assist;HOB elevated  General bed mobility comments Mod A for assist to raise trunk.  Balance  Overall balance  assessment Needs assistance  Sitting-balance support Single extremity supported  Sitting balance-Leahy Scale Fair  Standing balance-Leahy Scale Poor  Standing balance comment Reliance on BUE support via RW  Transfers  Overall transfer level Needs assistance  Equipment used Rolling walker (2 wheeled)  Transfers Sit to/from Stand  Sit to Stand Min assist  General transfer comment Min assist to power up to standing with increased effort noted. VCs for positioning and upright posture  OT - End of Session  Equipment Utilized During Treatment Gait belt;Rolling walker  Activity Tolerance Patient tolerated treatment well  Patient left in chair;with call bell/phone within reach  Nurse Communication Mobility status  OT Assessment/Plan  OT Plan Discharge plan remains appropriate  OT Visit Diagnosis Unsteadiness on feet (R26.81);Other abnormalities of gait and mobility (R26.89);Muscle weakness (generalized) (M62.81)  OT Frequency (ACUTE ONLY) Min 3X/week  Follow Up Recommendations [name];Supervision/Assistance - 24 hour  AM-PAC OT "6 Clicks" Daily Activity Outcome Measure (Version 2)  Help from another person eating meals? 1 (NPO)  Help from another person taking care of personal grooming? 3  Help from another person toileting, which includes using toliet, bedpan, or urinal? 2  Help from another person bathing (including washing, rinsing, drying)? 2  Help from another person to put on and taking off regular upper body clothing? 3  Help from another person to put on and taking off regular lower body clothing? 1  6 Click Score 12  OT Goal Progression  Progress towards OT goals Progressing toward goals  Acute Rehab OT Goals  Patient Stated Goal get out of here  OT Goal Formulation With patient  Time For Goal Achievement 02/26/19  Potential to Achieve Goals Good  OT Time Calculation  OT  Start Time (ACUTE ONLY) 0915  OT Stop Time (ACUTE ONLY) 0940  OT Time Calculation (min) 25 min  OT General  Charges  $OT Visit 1 Visit  OT Treatments  $Self Care/Home Management  8-22 mins  Martie Round, OTR/L Acute Rehabilitation Services Pager: [phone] Office: [phone]

## 2019-02-21 ENCOUNTER — Inpatient Hospital Stay (HOSPITAL_COMMUNITY): Payer: Medicare HMO

## 2019-02-21 LAB — CBC
HCT: 30.1 % — ABNORMAL LOW (ref 39.0–52.0)
Hemoglobin: 9.3 g/dL — ABNORMAL LOW (ref 13.0–17.0)
MCH: 29.7 pg (ref 26.0–34.0)
MCHC: 30.9 g/dL (ref 30.0–36.0)
MCV: 96.2 fL (ref 80.0–100.0)
Platelets: 337 10*3/uL (ref 150–400)
RBC: 3.13 MIL/uL — ABNORMAL LOW (ref 4.22–5.81)
RDW: 17.5 % — AB (ref 11.5–15.5)
WBC: 11.5 10*3/uL — ABNORMAL HIGH (ref 4.0–10.5)
nRBC: 0 % (ref 0.0–0.2)

## 2019-02-21 LAB — COMPREHENSIVE METABOLIC PANEL
ALT: 50 U/L — ABNORMAL HIGH (ref 0–44)
AST: 31 U/L (ref 15–41)
Albumin: 2.3 g/dL — ABNORMAL LOW (ref 3.5–5.0)
Alkaline Phosphatase: 93 U/L (ref 38–126)
Anion gap: 7 (ref 5–15)
BUN: 18 mg/dL (ref 8–23)
CO2: 24 mmol/L (ref 22–32)
Calcium: 8.3 mg/dL — ABNORMAL LOW (ref 8.9–10.3)
Chloride: 103 mmol/L (ref 98–111)
Creatinine, Ser: 1.28 mg/dL — ABNORMAL HIGH (ref 0.61–1.24)
GFR calc Af Amer: >60 mL/min (ref >60)
GFR, EST NON AFRICAN AMERICAN: 57 mL/min — AB (ref >60)
Glucose, Bld: 95 mg/dL (ref 70–99)
Potassium: 4.3 mmol/L (ref 3.5–5.1)
Sodium: 134 mmol/L — ABNORMAL LOW (ref 135–145)
Total Bilirubin: 1.1 mg/dL (ref 0.3–1.2)
Total Protein: 6.9 g/dL (ref 6.5–8.1)

## 2019-02-21 LAB — HEPARIN LEVEL (UNFRACTIONATED): Heparin Unfractionated: 0.42 IU/mL (ref 0.30–0.70)

## 2019-02-21 LAB — GLUCOSE, CAPILLARY
Glucose-Capillary: 108 mg/dL — ABNORMAL HIGH (ref 70–99)
Glucose-Capillary: 91 mg/dL (ref 70–99)

## 2019-02-21 LAB — MAGNESIUM: Magnesium: 2.1 mg/dL (ref 1.7–2.4)

## 2019-02-21 LAB — PHOSPHORUS: Phosphorus: 3.8 mg/dL (ref 2.5–4.6)

## 2019-02-21 MED ORDER — FLEET ENEMA 7-19 GM/118ML RE ENEM
1.0000 | ENEMA | Freq: Once | RECTAL | Status: AC
  Administered 2019-02-21: 1 via RECTAL
  Filled 2019-02-21: qty 1

## 2019-02-21 MED ORDER — TRAVASOL 10 % IV SOLN
INTRAVENOUS | Status: AC
  Administered 2019-02-21: 18:00:00 via INTRAVENOUS
  Filled 2019-02-21: qty 1209.6

## 2019-02-21 NOTE — Progress Notes (Addendum)
  Progress Note    02/21/2019 7:17 AM 6 Days Post-Op  Subjective:  Sleeping-wakes easily.  Passing flatus.  Denies nausea/vomiting  Afebrile HR 50's-70's NSR 110's-130's systolic 98% RA  Vitals:   02/21/19 0400 02/21/19 0612  BP: 117/77 129/78  Pulse: (!) 56 (!) 57  Resp: 15 17  Temp:  98.1 F (36.7 C)  SpO2: 94% 97%    Physical Exam: General:  No distress Lungs:  Non labored Extremities:  Bilateral feet are warm and well perfused Abdomen:  Soft, NT/ND; +BS; +flatus  CBC    Component Value Date/Time   WBC 11.5 (H) 02/21/2019 0300   RBC 3.13 (L) 02/21/2019 0300   HGB 9.3 (L) 02/21/2019 0300   HGB 15.7 09/27/2017 0930   HCT 30.1 (L) 02/21/2019 0300   HCT 46.8 09/27/2017 0930   PLT 337 02/21/2019 0300   PLT 275 09/27/2017 0930   MCV 96.2 02/21/2019 0300   MCV 94 09/27/2017 0930   MCH 29.7 02/21/2019 0300   MCHC 30.9 02/21/2019 0300   RDW 17.5 (H) 02/21/2019 0300   RDW 14.8 09/27/2017 0930   LYMPHSABS 1.6 02/18/2019 0331   MONOABS 0.7 02/18/2019 0331   EOSABS 0.7 (H) 02/18/2019 0331   BASOSABS 0.1 02/18/2019 0331    BMET    Component Value Date/Time   NA 134 (L) 02/21/2019 0300   NA 140 09/27/2017 0930   K 4.3 02/21/2019 0300   CL 103 02/21/2019 0300   CO2 24 02/21/2019 0300   GLUCOSE 95 02/21/2019 0300   BUN 18 02/21/2019 0300   BUN 14 09/27/2017 0930   CREATININE 1.28 (H) 02/21/2019 0300   CALCIUM 8.3 (L) 02/21/2019 0300   GFRNONAA 57 (L) 02/21/2019 0300   GFRAA >60 02/21/2019 0300    INR    Component Value Date/Time   INR 1.3 (H) 02/13/2019 0402     Intake/Output Summary (Last 24 hours) at 02/21/2019 0717 Last data filed at 02/21/2019 4401 Gross per 24 hour  Intake 691.43 ml  Output 3600 ml  Net -2908.57 ml     Assessment:  68 y.o. male is s/p:  Aortobifemoral bypass with bilateral common femoral endarterectomy and profundoplasty, bilateral iliac thrombectomy   6 Days Post-Op  Plan: -pt doing well this morning.  NGT removed  yesterday and he did not have any N/V and is passing flatus.  Dr. Darrick Penna to determine when to advance diet.  Continue TPN -renal function stable with good uop 3600cc/24hr -leukocytosis improving-pt afebrile -DVT prophylaxis:  Heparin gtt   Doreatha Massed, PA-C [facility] [phone] 02/21/2019 7:17 AM  Agree with above.  Ileus seems to be resolving Will check abdominal xray today. If no dilation of bowel will consider starting clear liquids tomorrow TPN for now  [name], MD [facility] [place] Office: [phone] Pager: [phone]

## 2019-02-21 NOTE — Progress Notes (Signed)
ANTICOAGULATION CONSULT NOTE - Follow Up Consult  Pharmacy Consult for Heparin Indication: atrial fibrillation  No Known Allergies  Patient Measurements: Height: 6\' 5"  (195.6 cm) Weight: 201 lb 4.5 oz (91.3 kg) IBW/kg (Calculated) : 89.1 Heparin Dosing Weight: 91.3 kg  Vital Signs: Temp: 98.1 F (36.7 C) (03/05 0612) Temp Source: Oral (03/05 0612) BP: 129/78 (03/05 0612) Pulse Rate: 57 (03/05 0612)  Labs: Recent Labs    02/19/19 0415 02/19/19 0416 02/20/19 0600 02/20/19 0737 02/21/19 0300  HGB  --  9.0*  --   --  9.3*  HCT  --  29.0*  --   --  30.1*  PLT  --  343  --   --  337  HEPARINUNFRC 0.46  --  0.42  --  0.42  CREATININE  --  1.31*  --  1.25* 1.28*    Estimated Creatinine Clearance: 68.6 mL/min (A) (by C-G formula based on SCr of 1.28 mg/dL (H)).  Assessment:  Anticoag: chronic Coumadin for afib, bridged with Lovenox PTA. S/p s/p 2/10 Aortobifemoral bypass with bilateral common femoral endarterectomy and profundoplasty, bilateral iliac thrombectomy. - Warfarin home dose: 1.5mg  daily, last dose 2/12 (LD 2/12)  Heparin level remains therapeutic at 0.42, H/H low but stable, plts wnl  Goal of Therapy:  Heparin level 0.3-0.7 units/ml Monitor platelets by anticoagulation protocol: Yes   Plan:  Continue heparin gtt at 1650 units/hr Daily heparin level, CBC, s/s bleeding F/u clear liquid diet and ability to start PO  Daylene Posey, PharmD Clinical Pharmacist Please check AMION for all [facility] numbers 02/21/2019 8:24 AM

## 2019-02-21 NOTE — Progress Notes (Signed)
Physical Therapy Treatment Patient Details Name: [name] [name] MRN: [id number] DOB: [birth year] Today's Date: 02/21/2019    History of Present Illness Pt s/p Aortobifemoral bypass on 2/10. Received central line 2/28. Pt developed afib with rvr, acute renal failure and ileus 2/12-2/13. Pt had NG tube for ileus that was removed 2/17 and had to be replaced 2/19. 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     PT Comments    Pt making excellent progress. In good spirits.    Follow Up Recommendations  SNF;Supervision/Assistance - 24 hour     Equipment Recommendations  Other (comment)(TBD)    Recommendations for Other Services       Precautions / Restrictions Precautions Precautions: Fall Restrictions Weight Bearing Restrictions: No    Mobility  Bed Mobility               General bed mobility comments: Pt up in chair  Transfers Overall transfer level: Needs assistance Equipment used: Rolling walker (2 wheeled) Transfers: Sit to/from Stand Sit to Stand: Min assist         General transfer comment: Assist to bring hips up and for balance. Assist to get lt hand on walker  Ambulation/Gait Ambulation/Gait assistance: Min assist;+2 safety/equipment Gait Distance (Feet): 150 Feet Assistive device: Rolling walker (2 wheeled) Gait Pattern/deviations: Shuffle;Decreased stride length;Narrow base of support;Step-through pattern Gait velocity: decreased Gait velocity interpretation: <1.31 ft/sec, indicative of household ambulator General Gait Details: Assist for balance. Verbal cues to widen gait and stand more erect   Stairs             Wheelchair Mobility    Modified Rankin (Stroke Patients Only)       Balance Overall balance assessment: Needs assistance Sitting-balance support: No upper extremity supported;Feet supported Sitting balance-Leahy Scale: Fair     Standing balance support: During functional  activity;Bilateral upper extremity supported Standing balance-Leahy Scale: Poor Standing balance comment: walker and min guard for static standing                            Cognition Arousal/Alertness: Awake/alert Behavior During Therapy: WFL for tasks assessed/performed Overall Cognitive Status: Within Functional Limits for tasks assessed                                        Exercises      General Comments General comments (skin integrity, edema, etc.): VSS      Pertinent Vitals/Pain Pain Assessment: No/denies pain    Home Living                      Prior Function            PT Goals (current goals can now be found in the care plan section) Acute Rehab PT Goals PT Goal Formulation: With patient Time For Goal Achievement: 03/07/19 Potential to Achieve Goals: Good Progress towards PT goals: Goals met and updated - see care plan;Progressing toward goals    Frequency    Min 3X/week      PT Plan Current plan remains appropriate;Frequency needs to be updated    Co-evaluation PT/OT/SLP Co-Evaluation/Treatment: Yes            AM-PAC PT "6 Clicks" Mobility   Outcome Measure  Help needed turning from your back to your side while in a  flat bed without using bedrails?: None Help needed moving from lying on your back to sitting on the side of a flat bed without using bedrails?: A Little Help needed moving to and from a bed to a chair (including a wheelchair)?: A Little Help needed standing up from a chair using your arms (e.g., wheelchair or bedside chair)?: A Little Help needed to walk in hospital room?: A Little Help needed climbing 3-5 steps with a railing? : Total 6 Click Score: 17    End of Session Equipment Utilized During Treatment: Gait belt Activity Tolerance: Patient tolerated treatment well Patient left: in chair;with call bell/phone within reach Nurse Communication: Mobility status PT Visit Diagnosis: Other  abnormalities of gait and mobility (R26.89);Muscle weakness (generalized) (M62.81)     Time: 6282-4175 PT Time Calculation (min) (ACUTE ONLY): 16 min  Charges:  $Gait Training: 8-22 mins                     [place] Pager [phone] Office [place] 02/21/2019, 11:55 AM

## 2019-02-21 NOTE — Care Management Important Message (Signed)
Important Message  Patient Details  Name: [name] [name] MRN: [id number] Date of Birth: 1951/11/04   Medicare Important Message Given:  Yes    Ceejay Kegley P Alastair Hennes 02/21/2019, 1:25 PM

## 2019-02-21 NOTE — Progress Notes (Signed)
PHARMACY - ADULT TOTAL PARENTERAL NUTRITION CONSULT NOTE   Pharmacy Consult:  TPN Indication:  Prolonged ileus  Patient Measurements: Height: 6\' 5"  (195.6 cm) Weight: 201 lb 4.5 oz (91.3 kg) IBW/kg (Calculated) : 89.1 TPN AdjBW (KG): 84.4 Body mass index is 23.87 kg/m.  Assessment:  24 YOM with history of LE weakness and CVA from AFib.  He presented on 01/28/19 for aorta bifemoral bypass graft, femoral endarterectomy and iliac thrombectomy.  Patient was started on a clear liquid diet post-op but intake was inadequate.  NG tube placed on 01/31/19 and patient was made NPO again.  He was started on clears on 02/05/19 and vomitted.  Pharmacy consulted to manage TPN for nutrition support.    Patient has aphasia and reports he eats primarily soup and was eating regularly up until the day of admission.  He has minimal to no intake for ~10 days and is at risk for refeeding.  GI: Has persistent ileus- Patient having BMs and passing flatus. Last abdominal Xray on 3/1 showed scattered large and small bowel gas. Prealbumin improved to 15.3, NG tube d/c'ed on 3/4. PPI IV, PRN Zofran. Endo: no hx DM - CBGs controlled. No SSI required. Lytes: Na down to 134, K 4.3 (goal > 4 in ileus) Phos/Mg wnl.  Renal: SCr 1.28, D5NS at 20/hr, net -2.9 L, foley 2/21 CT showed left hydronephrosis Pulm: stable on RA Cards: BP stable, was in and out of Afib, currently SB with HR 50s-60s- off amiodarone gtt and ASA. Cardiology re-consulted. AC: Heparin for Afib - H/H improving.  2/21 CT showed RP hematoma Hepatobil: LFTs improved, Tbili wnl. Trig wnl. Neuro: PVD/CVA - PRN Dilaudid ID: Enterobacter aerogenes bacteremia sensitive cefepime. Antibiotic D#8. Now afebrile, WBC down. PICC pulled 2/27 due to fever. CVC placed 2/28. Cath tip cx - intermediate to Zosyn.  Due to conflicting sensitivities, changed to cefepime 3/2  TPN Access: New CVC triple lumen placed on 2/28 TPN start date: 02/06/19  Nutritional Goals (RD rec  2/27): 2200-2400 kCal, 110-125g protein, >/= 2.2L fluid per day  Current Nutrition: NPO  TPN  Plan:  Continue TPN tonight at 35mL/hr This TPN provides 121g AA, 324g CHO and 69g ILE for a total of 2,275 kCal, meeting 100% of patient's needs. Electrolytes in TPN: max Na, Cl:Ac 1:2 Daily multivitamin and trace elements in TPN Continue D5NS at 61mL/hr per MD Monitor TPN labs Abd xray today - if no dilation of bowel, vascular planning to start clear liquids tomorrow   Vinnie Level, PharmD., BCPS Clinical Pharmacist Clinical phone for 02/21/19 until 3:30pm: [phone] If after 3:30pm, please refer to [facility] for unit-specific pharmacist

## 2019-02-21 NOTE — Progress Notes (Signed)
Pt with diffuse air filled loops small and large bowel.  Still with contrast right colon.  Will give enema today and repeat x 1 if no result.  Keep NPO  [name], MD [facility] [place] Office: [phone] Pager: [phone]

## 2019-02-22 LAB — CBC
HCT: 30 % — ABNORMAL LOW (ref 39.0–52.0)
HEMOGLOBIN: 9.3 g/dL — AB (ref 13.0–17.0)
MCH: 30.3 pg (ref 26.0–34.0)
MCHC: 31 g/dL (ref 30.0–36.0)
MCV: 97.7 fL (ref 80.0–100.0)
Platelets: 312 10*3/uL (ref 150–400)
RBC: 3.07 MIL/uL — AB (ref 4.22–5.81)
RDW: 17.8 % — ABNORMAL HIGH (ref 11.5–15.5)
WBC: 9.3 10*3/uL (ref 4.0–10.5)
nRBC: 0 % (ref 0.0–0.2)

## 2019-02-22 LAB — BASIC METABOLIC PANEL
Anion gap: 5 (ref 5–15)
BUN: 22 mg/dL (ref 8–23)
CHLORIDE: 106 mmol/L (ref 98–111)
CO2: 25 mmol/L (ref 22–32)
Calcium: 8.4 mg/dL — ABNORMAL LOW (ref 8.9–10.3)
Creatinine, Ser: 1.29 mg/dL — ABNORMAL HIGH (ref 0.61–1.24)
GFR calc Af Amer: >60 mL/min (ref >60)
GFR calc non Af Amer: 56 mL/min — ABNORMAL LOW (ref >60)
Glucose, Bld: 103 mg/dL — ABNORMAL HIGH (ref 70–99)
Potassium: 4.1 mmol/L (ref 3.5–5.1)
Sodium: 136 mmol/L (ref 135–145)

## 2019-02-22 LAB — GLUCOSE, CAPILLARY
Glucose-Capillary: 101 mg/dL — ABNORMAL HIGH (ref 70–99)
Glucose-Capillary: 116 mg/dL — ABNORMAL HIGH (ref 70–99)
Glucose-Capillary: 124 mg/dL — ABNORMAL HIGH (ref 70–99)

## 2019-02-22 LAB — HEPARIN LEVEL (UNFRACTIONATED): Heparin Unfractionated: 0.44 IU/mL (ref 0.30–0.70)

## 2019-02-22 MED ORDER — TRAVASOL 10 % IV SOLN
INTRAVENOUS | Status: AC
  Administered 2019-02-22: 17:00:00 via INTRAVENOUS
  Filled 2019-02-22: qty 1209.6

## 2019-02-22 NOTE — Progress Notes (Addendum)
Progress Note  Patient Name: [name] [name] Date of Encounter: 02/22/2019  Primary Cardiologist:   Rollene Rotunda, MD   Subjective   Feels fine.  No chest pain .  No SOB.   Inpatient Medications    Scheduled Meds: . Chlorhexidine Gluconate Cloth  6 each Topical Daily  . mouth rinse  15 mL Mouth Rinse BID  . pantoprazole (PROTONIX) IV  40 mg Intravenous Q24H  . sodium chloride flush  10-40 mL Intracatheter Q12H   Continuous Infusions: . ceFEPime (MAXIPIME) IV 2 g (02/22/19 1610)  . dextrose 5 % and 0.9% NaCl 20 mL/hr at 02/22/19 0500  . heparin 1,650 Units/hr (02/22/19 0956)  . TPN ADULT (ION) 90 mL/hr at 02/22/19 0500  . TPN ADULT (ION)     PRN Meds: acetaminophen **OR** acetaminophen, bisacodyl, guaiFENesin-dextromethorphan, hydrALAZINE, labetalol, lip balm, ondansetron, phenol, sodium chloride flush   Vital Signs    Vitals:   02/21/19 1743 02/21/19 2000 02/22/19 0619 02/22/19 1022  BP:  119/76 123/71   Pulse: 65 (!) 59 66   Resp: (!) 21 20 20    Temp:  98 F (36.7 C) 98.3 F (36.8 C)   TempSrc:  Oral Oral   SpO2: 99% 97% 98%   Weight:    80.4 kg  Height:        Intake/Output Summary (Last 24 hours) at 02/22/2019 1025 Last data filed at 02/22/2019 1008 Gross per 24 hour  Intake 4599.21 ml  Output 1250 ml  Net 3349.21 ml   Filed Weights   02/15/19 0922 02/18/19 0602 02/22/19 1022  Weight: 92.7 kg 91.3 kg 80.4 kg    Telemetry    NSR - Personally Reviewed  ECG    NA - Personally Reviewed  Physical Exam   GEN: No  acute distress.   Neck: No  JVD Cardiac: RRR, no murmurs, rubs, or gallops.  Respiratory: Basilar crackles right lung base GI: Soft, nontender, non-distended, normal bowel sounds  MS:  No edema; No deformity. Neuro:   Hemiparesis Psych: Oriented and appropriate    Labs    Chemistry Recent Labs  Lab 02/16/19 0430  02/18/19 0331  02/20/19 0737 02/21/19 0300 02/22/19 0325  NA 137   < > 135   < > 135 134* 136  K 4.1   < >  4.1   < > 4.3 4.3 4.1  CL 109   < > 107   < > 105 103 106  CO2 21*   < > 24   < > 24 24 25   GLUCOSE 128*   < > 119*   < > 114* 95 103*  BUN 12   < > 12   < > 17 18 22   CREATININE 1.26*   < > 1.25*   < > 1.25* 1.28* 1.29*  CALCIUM 7.4*   < > 7.9*   < > 8.2* 8.3* 8.4*  PROT 5.5*  --  6.0*  --   --  6.9  --   ALBUMIN 1.7*  --  1.9*  --   --  2.3*  --   AST 42*  --  62*  --   --  31  --   ALT 46*  --  66*  --   --  50*  --   ALKPHOS 80  --  94  --   --  93  --   BILITOT 1.0  --  1.1  --   --  1.1  --   [facility]  58*   < > 58*   < > 58* 57* 56*  GFRAA >60   < > >60   < > >60 >60 >60  ANIONGAP 7   < > 4*   < > 6 7 5    < > = values in this interval not displayed.     Hematology Recent Labs  Lab 02/19/19 0416 02/21/19 0300 02/22/19 0325  WBC 15.6* 11.5* 9.3  RBC 3.06* 3.13* 3.07*  HGB 9.0* 9.3* 9.3*  HCT 29.0* 30.1* 30.0*  MCV 94.8 96.2 97.7  MCH 29.4 29.7 30.3  MCHC 31.0 30.9 31.0  RDW 17.0* 17.5* 17.8*  PLT 343 337 312    Cardiac EnzymesNo results for input(s): TROPONINI in the last 168 hours. No results for input(s): TROPIPOC in the last 168 hours.   BNPNo results for input(s): BNP, PROBNP in the last 168 hours.   DDimer No results for input(s): DDIMER in the last 168 hours.   Radiology    Dg Abd 2 Views  Result Date: 02/21/2019 CLINICAL DATA:  Abdominal pain and tenderness EXAM: ABDOMEN - 2 VIEW COMPARISON:  02/17/2019 FINDINGS: Scattered large and small bowel gas is noted. Retained contrast and fecal material is noted within the right colon stable from 4 days previous. No obstructive changes are seen. No free air is noted. No abnormal mass or abnormal calcifications are noted. Mild degenerative change of the lumbar spine is seen. IMPRESSION: Stable retained fecal material within the right colon from 4 days previous consistent with a degree of constipation. No other focal abnormality is noted. Electronically Signed   By: Alcide Clever M.D.   On: 02/21/2019 10:21    Cardiac  Studies    ECHO 2/20   1. The left ventricle has normal systolic function with an ejection fraction of 60-65%. The cavity size was normal. There is moderately increased left ventricular wall thickness. Left ventricular diastology could not be evaluated secondary to atrial  fibrillation.  2. The right ventricle has normal systolic function. The cavity was normal. There is no increase in right ventricular wall thickness.  3. The mitral valve is normal in structure.  4. The tricuspid valve is normal in structure.  5. Probably trileaflet Mild thickening of the aortic valve Moderate calcification of the aortic valve. no stenosis of the aortic valve.  6. The pulmonic valve was normal in structure.  7. 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  8. 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  9. Right atrial pressure is estimated at 3 mmHg.  Patient Profile     68 y.o. male with a hx of atrial fibrillation and tachy-brady syndrome who has refused PPM in the past, chronic anticoagulation therapy w/ Coumadin, h/o CVA, CKD and PVD admitted for elective Bi-Femoral bypass, who is being seen for the evaluation ofatrial fibrillation w/ RVRat the request of Dr. Darrick Penna, Vascular Surgery.  Assessment & Plan    PAF/FLUTTER:    Maintaining  NSR.  No need for oral amiodarone.  Can restart warfarin when taking POs.    [facility] will sign off.   Medication Recommendations:  As above Other recommendations (labs, testing, etc):  NA Follow up as an outpatient:   We will arrange about one month follow up.    Please note:  When he is discharged please call our service so that we can be sure that we have appropriate timing for Coumadin follow up.    For questions  or updates, please contact [facility] Please consult [email or web address] for contact info under Cardiology/STEMI.   Signed, Rollene Rotunda, MD  02/22/2019, 10:25 AM

## 2019-02-22 NOTE — Progress Notes (Addendum)
  Progress Note    02/22/2019 7:50 AM 7 Days Post-Op  Subjective:  No complaints; says he had a bowel movement when asked  Afebrile HR 50's-60's NSR 90's-120's systolic 96% RA   Vitals:   02/21/19 2000 02/22/19 0619  BP: 119/76 123/71  Pulse: (!) 59 66  Resp: 20 20  Temp: 98 F (36.7 C) 98.3 F (36.8 C)  SpO2: 97% 98%    Physical Exam: General:  No distress Lungs:  Non labored Incisions:  Healing nicely Extremities:  Bilateral feet are warm Abdomen:  Soft, NT/ND; increased bowel sounds from yesterday.    CBC    Component Value Date/Time   WBC 9.3 02/22/2019 0325   RBC 3.07 (L) 02/22/2019 0325   HGB 9.3 (L) 02/22/2019 0325   HGB 15.7 09/27/2017 0930   HCT 30.0 (L) 02/22/2019 0325   HCT 46.8 09/27/2017 0930   PLT 312 02/22/2019 0325   PLT 275 09/27/2017 0930   MCV 97.7 02/22/2019 0325   MCV 94 09/27/2017 0930   MCH 30.3 02/22/2019 0325   MCHC 31.0 02/22/2019 0325   RDW 17.8 (H) 02/22/2019 0325   RDW 14.8 09/27/2017 0930   LYMPHSABS 1.6 02/18/2019 0331   MONOABS 0.7 02/18/2019 0331   EOSABS 0.7 (H) 02/18/2019 0331   BASOSABS 0.1 02/18/2019 0331    BMET    Component Value Date/Time   NA 136 02/22/2019 0325   NA 140 09/27/2017 0930   K 4.1 02/22/2019 0325   CL 106 02/22/2019 0325   CO2 25 02/22/2019 0325   GLUCOSE 103 (H) 02/22/2019 0325   BUN 22 02/22/2019 0325   BUN 14 09/27/2017 0930   CREATININE 1.29 (H) 02/22/2019 0325   CALCIUM 8.4 (L) 02/22/2019 0325   GFRNONAA 56 (L) 02/22/2019 0325   GFRAA >60 02/22/2019 0325    INR    Component Value Date/Time   INR 1.3 (H) 02/13/2019 0402     Intake/Output Summary (Last 24 hours) at 02/22/2019 0750 Last data filed at 02/22/2019 0500 Gross per 24 hour  Intake 4599.21 ml  Output 625 ml  Net 3974.21 ml     Assessment:  68 y.o. male is s/p:  Aortobifemoral bypass with bilateral common femoral endarterectomy and profundoplasty, bilateral iliac thrombectomy  25 Days Post-Op  Plan: -pt had BM  this am and has more bowel sounds this morning -continue npo and TPN -DVT prophylaxis:  Heparin gtt -leukocytosis improved as well as creatinine -mobilize   Doreatha Massed, PA-C [facility] [phone] 02/22/2019   No nausea or distention after 48 hours without NG BM yesterday Will try clears today  25 days post op should no longer need narcotics PO tylenol if needed for pain  If tolerates clears for 48 hours will go to full liquids on 3/8  [name], MD [facility] [place] Office: [phone] Pager: [phone]

## 2019-02-22 NOTE — Progress Notes (Signed)
PHARMACY - ADULT TOTAL PARENTERAL NUTRITION CONSULT NOTE   Pharmacy Consult:  TPN Indication:  Prolonged ileus  Patient Measurements: Height: 6\' 5"  (195.6 cm) Weight: 201 lb 4.5 oz (91.3 kg) IBW/kg (Calculated) : 89.1 TPN AdjBW (KG): 84.4 Body mass index is 23.87 kg/m.  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  He presented on 01/28/19 for aorta bifemoral bypass graft, femoral endarterectomy and iliac thrombectomy.  Patient was started on a clear liquid diet post-op but intake was inadequate.  NG tube placed on 01/31/19 and patient was made NPO again.  He was started on clears on 02/05/19 and vomitted.  Pharmacy consulted to manage TPN for nutrition support.    Patient has aphasia and reports he eats primarily soup and was eating regularly up until the day of admission.  He has minimal to no intake for ~10 days and is at risk for refeeding.  GI: Has persistent ileus- Having BM 3/6 and bowel sounds. Diffuse air filled loops in small/large bowel, to keep NPO for now. Prealbumin improved to 15.3, NG tube d/c'ed on 3/4. PPI IV, PRN Zofran. Endo: no hx DM - CBGs controlled. No SSI required. Lytes: K 4.1 (goal > 4 in ileus), others wnl Renal: SCr 1.29, D5NS at 20/hr, UOP 0.70ml/kg/hr, foley 2/21 CT showed left hydronephrosis Pulm: stable on RA Cards: BP stable, was in and out of Afib, currently SB with HR 50s-60s- off amiodarone gtt and ASA. Cardiology re-consulted. AC: Heparin for Afib - H/H improving.  2/21 CT showed RP hematoma Hepatobil: LFTs improved, Tbili wnl. Trig wnl. Neuro: PVD/CVA - PRN Dilaudid ID: Enterobacter aerogenes bacteremia sensitive cefepime. Antibiotic D#10. Now afebrile, WBC down. PICC pulled 2/27 due to fever. CVC placed 2/28. Cath tip cx - intermediate to Zosyn.  Due to conflicting sensitivities, changed to cefepime 3/2  TPN Access: New CVC triple lumen placed on 2/28 TPN start date: 02/06/19  Nutritional Goals (RD rec 2/27): 2200-2400 kCal,  110-125g protein, >/= 2.2L fluid per day  Current Nutrition: NPO  TPN  Plan:  Continue TPN tonight at 88mL/hr This TPN provides 121g AA, 324g CHO and 69g ILE for a total of 2,275 kCal, meeting 100% of patient's needs. Electrolytes in TPN: max Na, Cl:Ac 1:2 Daily multivitamin and trace elements in TPN Continue D5NS at 52mL/hr per MD F/u BMP in AM F/u start of clears  Daylene Posey, PharmD Clinical Pharmacist Please check AMION for all [facility] Pharmacy numbers 02/22/2019 8:17 AM

## 2019-02-22 NOTE — Progress Notes (Signed)
ANTICOAGULATION CONSULT NOTE - Follow Up Consult  Pharmacy Consult for Heparin Indication: atrial fibrillation  No Known Allergies  Patient Measurements: Height: 6\' 5"  (195.6 cm) Weight: 201 lb 4.5 oz (91.3 kg) IBW/kg (Calculated) : 89.1 Heparin Dosing Weight: 91.3 kg  Vital Signs: Temp: 98.3 F (36.8 C) (03/06 0619) Temp Source: Oral (03/06 0619) BP: 123/71 (03/06 0619) Pulse Rate: 66 (03/06 0619)  Labs: Recent Labs    02/20/19 0600 02/20/19 0737 02/21/19 0300 02/22/19 0325  HGB  --   --  9.3* 9.3*  HCT  --   --  30.1* 30.0*  PLT  --   --  337 312  HEPARINUNFRC 0.42  --  0.42 0.44  CREATININE  --  1.25* 1.28* 1.29*    Estimated Creatinine Clearance: 68.1 mL/min (A) (by C-G formula based on SCr of 1.29 mg/dL (H)).  Assessment:  Anticoag: chronic Coumadin for afib, bridged with Lovenox PTA. S/p s/p 2/10 Aortobifemoral bypass with bilateral common femoral endarterectomy and profundoplasty, bilateral iliac thrombectomy. - Warfarin home dose: 1.5mg  daily, last dose 2/12 (LD 2/12)  Heparin level remains therapeutic at 0.42, H/H low but stable, plts wnl.  Goal of Therapy:  Heparin level 0.3-0.7 units/ml Monitor platelets by anticoagulation protocol: Yes   Plan:  Continue heparin gtt at 1650 units/hr Daily heparin level, CBC, s/s bleeding  Sheppard Coil PharmD., BCPS Clinical Pharmacist 02/22/2019 8:56 AM

## 2019-02-23 LAB — CBC
HCT: 32 % — ABNORMAL LOW (ref 39.0–52.0)
Hemoglobin: 9.6 g/dL — ABNORMAL LOW (ref 13.0–17.0)
MCH: 29.4 pg (ref 26.0–34.0)
MCHC: 30 g/dL (ref 30.0–36.0)
MCV: 98.2 fL (ref 80.0–100.0)
Platelets: 305 10*3/uL (ref 150–400)
RBC: 3.26 MIL/uL — ABNORMAL LOW (ref 4.22–5.81)
RDW: 17.7 % — ABNORMAL HIGH (ref 11.5–15.5)
WBC: 8.5 10*3/uL (ref 4.0–10.5)
nRBC: 0 % (ref 0.0–0.2)

## 2019-02-23 LAB — GLUCOSE, CAPILLARY
GLUCOSE-CAPILLARY: 105 mg/dL — AB (ref 70–99)
Glucose-Capillary: 100 mg/dL — ABNORMAL HIGH (ref 70–99)
Glucose-Capillary: 117 mg/dL — ABNORMAL HIGH (ref 70–99)

## 2019-02-23 LAB — BASIC METABOLIC PANEL
Anion gap: 11 (ref 5–15)
BUN: 23 mg/dL (ref 8–23)
CO2: 23 mmol/L (ref 22–32)
CREATININE: 1.27 mg/dL — AB (ref 0.61–1.24)
Calcium: 8.5 mg/dL — ABNORMAL LOW (ref 8.9–10.3)
Chloride: 102 mmol/L (ref 98–111)
GFR calc Af Amer: >60 mL/min (ref >60)
GFR calc non Af Amer: 57 mL/min — ABNORMAL LOW (ref >60)
Glucose, Bld: 105 mg/dL — ABNORMAL HIGH (ref 70–99)
Potassium: 4.3 mmol/L (ref 3.5–5.1)
Sodium: 136 mmol/L (ref 135–145)

## 2019-02-23 LAB — HEPARIN LEVEL (UNFRACTIONATED): Heparin Unfractionated: 0.29 IU/mL — ABNORMAL LOW (ref 0.30–0.70)

## 2019-02-23 MED ORDER — TRAVASOL 10 % IV SOLN
INTRAVENOUS | Status: AC
  Administered 2019-02-23: 17:00:00 via INTRAVENOUS
  Filled 2019-02-23: qty 1209.6

## 2019-02-23 NOTE — Progress Notes (Signed)
PHARMACY - ADULT TOTAL PARENTERAL NUTRITION CONSULT NOTE   Pharmacy Consult:  TPN Indication:  Prolonged ileus  Patient Measurements: Height: 6\' 5"  (195.6 cm) Weight: 177 lb 4 oz (80.4 kg) IBW/kg (Calculated) : 89.1 TPN AdjBW (KG): 84.4 Body mass index is 21.02 kg/m.  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  He presented on 01/28/19 for aorta bifemoral bypass graft, femoral endarterectomy and iliac thrombectomy.  Patient was started on a clear liquid diet post-op but intake was inadequate.  NG tube placed on 01/31/19 and patient was made NPO again.  He was started on clears on 02/05/19 and vomitted.  Pharmacy consulted to manage TPN for nutrition support.    Patient has aphasia and reports he eats primarily soup and was eating regularly up until the day of admission.  He has minimal to no intake for ~10 days and is at risk for refeeding.  GI: Has persistent ileus- Having BM 3/6 and bowel sounds. Attempting clears with plan for full liquids if tolerated over 48h. Prealbumin improved to 15.3, NG tube d/c'ed on 3/4. PPI IV, PRN Zofran. Endo: no hx DM - CBGs controlled. No SSI required. Lytes: K 4.3 (goal > 4 in ileus), others wnl Renal: SCr 1.27, D5NS at 20/hr, UOP 1.24ml/kg/hr, foley 2/21 CT showed left hydronephrosis Pulm: stable on RA Cards: BP stable, was in and out of Afib, currently SB with HR 50s-60s- off amiodarone gtt and ASA. Cardiology re-consulted. AC: Heparin for Afib - H/H improving.  2/21 CT showed RP hematoma Hepatobil: LFTs improved, Tbili wnl. Trig wnl. Neuro: PVD/CVA - PRN Dilaudid ID: Enterobacter aerogenes bacteremia sensitive cefepime. Antibiotic D#10. Now afebrile, WBC down. PICC pulled 2/27 due to fever. CVC placed 2/28. Cath tip cx - intermediate to Zosyn.  Due to conflicting sensitivities, changed to cefepime 3/2  TPN Access: New CVC triple lumen placed on 2/28 TPN start date: 02/06/19  Nutritional Goals (RD rec 2/27): 2200-2400 kCal,  110-125g protein, >/= 2.2L fluid per day  Current Nutrition: Clears TPN  Plan:  Continue TPN tonight at 36mL/hr This TPN provides 121g AA, 324g CHO and 69g ILE for a total of 2,275 kCal, meeting 100% of patient's needs. Electrolytes in TPN: max Na, Cl:Ac 1:2 Daily multivitamin and trace elements in TPN Continue D5NS at 64mL/hr per MD F/u BMP in AM F/u start of full liquid diet  Daylene Posey, PharmD Clinical Pharmacist Please check AMION for all [facility] Pharmacy numbers 02/23/2019 7:03 AM

## 2019-02-23 NOTE — Progress Notes (Addendum)
  Progress Note    02/23/2019 9:12 AM 8 Days Post-Op  Subjective:  Sleeping-awakes easily.  Denies any nausea/vomiting with taking clears.  Says he had BM yesterday  Afebrile HR 60's-100's afib/NSR 100's systolic 97% RA  Vitals:   02/22/19 1948 02/23/19 0900  BP: 116/73 107/72  Pulse: 66   Resp: 20   Temp: (!) 97.5 F (36.4 C) 97.7 F (36.5 C)  SpO2: 98%     Physical Exam: General:  No distress Lungs:  Non labored Incisions:  Healing nicely Abdomen:  Soft, NT/ND, +BM, -N/V  CBC    Component Value Date/Time   WBC 8.5 02/23/2019 0515   RBC 3.26 (L) 02/23/2019 0515   HGB 9.6 (L) 02/23/2019 0515   HGB 15.7 09/27/2017 0930   HCT 32.0 (L) 02/23/2019 0515   HCT 46.8 09/27/2017 0930   PLT 305 02/23/2019 0515   PLT 275 09/27/2017 0930   MCV 98.2 02/23/2019 0515   MCV 94 09/27/2017 0930   MCH 29.4 02/23/2019 0515   MCHC 30.0 02/23/2019 0515   RDW 17.7 (H) 02/23/2019 0515   RDW 14.8 09/27/2017 0930   LYMPHSABS 1.6 02/18/2019 0331   MONOABS 0.7 02/18/2019 0331   EOSABS 0.7 (H) 02/18/2019 0331   BASOSABS 0.1 02/18/2019 0331    BMET    Component Value Date/Time   NA 136 02/23/2019 0515   NA 140 09/27/2017 0930   K 4.3 02/23/2019 0515   CL 102 02/23/2019 0515   CO2 23 02/23/2019 0515   GLUCOSE 105 (H) 02/23/2019 0515   BUN 23 02/23/2019 0515   BUN 14 09/27/2017 0930   CREATININE 1.27 (H) 02/23/2019 0515   CALCIUM 8.5 (L) 02/23/2019 0515   GFRNONAA 57 (L) 02/23/2019 0515   GFRAA >60 02/23/2019 0515    INR    Component Value Date/Time   INR 1.3 (H) 02/13/2019 0402     Intake/Output Summary (Last 24 hours) at 02/23/2019 0912 Last data filed at 02/23/2019 6837 Gross per 24 hour  Intake 2263.14 ml  Output 2625 ml  Net -361.86 ml     Assessment:  68 y.o. male is s/p:  Aortobifemoral bypass with bilateral common femoral endarterectomy and profundoplasty, bilateral iliac thrombectomy  26 Days Post-Op  Plan: -pt doing well this morning and tolerating  clear liquids.  If tolerates today, will advance to full liquids tomorrow.  Keep TPN for now.   -DVT prophylaxis:  Heparin gtt.  Start coumadin when taking po's.  Will need to call cardiology service when pt is discharged so they can arrange INR checks. -creatinine ok today at 1.27   Doreatha Massed, PA-C [facility] [phone] 02/23/2019 9:12 AM  I have seen and evaluated the patient. I agree with the PA note as documented above. Tolerating clear liquids.   No nausea.  Abdomen soft.  Continue CLD today and possibly full liquids tomorrow.  Also has TPN to supplement nutrition.  Heparin gtt for now for atrial fibrillation (chronic anticoagulation on coumadin).  Cephus Shelling, MD [facility] [place] Office: [phone] Pager: [phone]

## 2019-02-23 NOTE — Progress Notes (Signed)
ANTICOAGULATION CONSULT NOTE - Follow Up Consult  Pharmacy Consult for Heparin Indication: atrial fibrillation  No Known Allergies  Patient Measurements: Height: 6\' 5"  (195.6 cm) Weight: 177 lb 4 oz (80.4 kg) IBW/kg (Calculated) : 89.1 Heparin Dosing Weight: 80.4 kg  Vital Signs: Temp: 97.5 F (36.4 C) (03/06 1948) Temp Source: Oral (03/06 1948) BP: 116/73 (03/06 1948) Pulse Rate: 66 (03/06 1948)  Labs: Recent Labs    02/21/19 0300 02/22/19 0325 02/23/19 0515  HGB 9.3* 9.3* 9.6*  HCT 30.1* 30.0* 32.0*  PLT 337 312 305  HEPARINUNFRC 0.42 0.44 0.29*  CREATININE 1.28* 1.29* 1.27*    Estimated Creatinine Clearance: 62.4 mL/min (A) (by C-G formula based on SCr of 1.27 mg/dL (H)).   Assessment:  Anticoag: chronic Coumadin for afib, bridged with Lovenox PTA. S/p s/p Aortobifemoral bypass with bilateral common femoral endarterectomy and profundoplasty, bilateral iliac thrombectomy.  - HL 0.29 slightly low. Hgb 9.6 stable. Plts 305 WNL.  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 Resume warfarin when taking po's  Samik Balkcom S. Merilynn Finland, PharmD, BCPS Clinical Staff Pharmacist Misty Stanley Stillinger 02/23/2019,7:14 AM

## 2019-02-24 LAB — BASIC METABOLIC PANEL
Anion gap: 8 (ref 5–15)
BUN: 22 mg/dL (ref 8–23)
CALCIUM: 8.4 mg/dL — AB (ref 8.9–10.3)
CO2: 26 mmol/L (ref 22–32)
Chloride: 101 mmol/L (ref 98–111)
Creatinine, Ser: 1.24 mg/dL (ref 0.61–1.24)
GFR calc Af Amer: >60 mL/min (ref >60)
GFR calc non Af Amer: 59 mL/min — ABNORMAL LOW (ref >60)
Glucose, Bld: 99 mg/dL (ref 70–99)
Potassium: 4.1 mmol/L (ref 3.5–5.1)
Sodium: 135 mmol/L (ref 135–145)

## 2019-02-24 LAB — HEPARIN LEVEL (UNFRACTIONATED): Heparin Unfractionated: 0.42 IU/mL (ref 0.30–0.70)

## 2019-02-24 LAB — GLUCOSE, CAPILLARY
GLUCOSE-CAPILLARY: 103 mg/dL — AB (ref 70–99)
Glucose-Capillary: 108 mg/dL — ABNORMAL HIGH (ref 70–99)

## 2019-02-24 LAB — CULTURE, BLOOD (SINGLE)
Culture: NO GROWTH
Special Requests: ADEQUATE

## 2019-02-24 LAB — CBC
HCT: 31.7 % — ABNORMAL LOW (ref 39.0–52.0)
Hemoglobin: 9.4 g/dL — ABNORMAL LOW (ref 13.0–17.0)
MCH: 29.3 pg (ref 26.0–34.0)
MCHC: 29.7 g/dL — ABNORMAL LOW (ref 30.0–36.0)
MCV: 98.8 fL (ref 80.0–100.0)
Platelets: 291 10*3/uL (ref 150–400)
RBC: 3.21 MIL/uL — ABNORMAL LOW (ref 4.22–5.81)
RDW: 17.6 % — ABNORMAL HIGH (ref 11.5–15.5)
WBC: 8.4 10*3/uL (ref 4.0–10.5)
nRBC: 0 % (ref 0.0–0.2)

## 2019-02-24 MED ORDER — SODIUM CHLORIDE 4 MEQ/ML IV SOLN
INTRAVENOUS | Status: AC
  Administered 2019-02-24: 20:00:00 via INTRAVENOUS
  Filled 2019-02-24 (×3): qty 1000

## 2019-02-24 MED ORDER — SODIUM CHLORIDE 4 MEQ/ML IV SOLN
INTRAVENOUS | Status: DC
  Filled 2019-02-24 (×2): qty 1000

## 2019-02-24 MED ORDER — TRAVASOL 10 % IV SOLN
INTRAVENOUS | Status: DC
  Filled 2019-02-24: qty 1209.6

## 2019-02-24 NOTE — Progress Notes (Addendum)
  Progress Note    02/24/2019 9:26 AM 9 Days Post-Op  Subjective:  Denies any nausea/vomiting with increasing diet; passing flatus; denies BM for yesterday  Afebrile HR 50's-80's  100's-120's systolic 97% RA  Vitals:   02/23/19 2000 02/24/19 0400  BP: 117/78 113/62  Pulse: (!) 57 (!) 54  Resp: 19 20  Temp:  97.8 F (36.6 C)  SpO2: 96% 94%    Physical Exam: Cardiac:  regular Lungs:  Non labored Incisions:   Healing nicely  Extremities:  Bilateral feet warm  Abdomen:  Soft, NT/ND; +flatus  CBC    Component Value Date/Time   WBC 8.4 02/24/2019 0440   RBC 3.21 (L) 02/24/2019 0440   HGB 9.4 (L) 02/24/2019 0440   HGB 15.7 09/27/2017 0930   HCT 31.7 (L) 02/24/2019 0440   HCT 46.8 09/27/2017 0930   PLT 291 02/24/2019 0440   PLT 275 09/27/2017 0930   MCV 98.8 02/24/2019 0440   MCV 94 09/27/2017 0930   MCH 29.3 02/24/2019 0440   MCHC 29.7 (L) 02/24/2019 0440   RDW 17.6 (H) 02/24/2019 0440   RDW 14.8 09/27/2017 0930   LYMPHSABS 1.6 02/18/2019 0331   MONOABS 0.7 02/18/2019 0331   EOSABS 0.7 (H) 02/18/2019 0331   BASOSABS 0.1 02/18/2019 0331    BMET    Component Value Date/Time   NA 135 02/24/2019 0440   NA 140 09/27/2017 0930   K 4.1 02/24/2019 0440   CL 101 02/24/2019 0440   CO2 26 02/24/2019 0440   GLUCOSE 99 02/24/2019 0440   BUN 22 02/24/2019 0440   BUN 14 09/27/2017 0930   CREATININE 1.24 02/24/2019 0440   CALCIUM 8.4 (L) 02/24/2019 0440   GFRNONAA 59 (L) 02/24/2019 0440   GFRAA >60 02/24/2019 0440    INR    Component Value Date/Time   INR 1.3 (H) 02/13/2019 0402     Intake/Output Summary (Last 24 hours) at 02/24/2019 0926 Last data filed at 02/24/2019 0600 Gross per 24 hour  Intake 4773.55 ml  Output 3025 ml  Net 1748.55 ml     Assessment:  68 y.o. male is s/p:  Aortobifemoral bypass with bilateral common femoral endarterectomy and profundoplasty, bilateral iliac thrombectomy  27 Days Post-Op  Plan: -pt doing well and continues to  tolerate clear liquids and therefore will advance diet to full liquids today.  Keep TPN going -heparin gtt until tolerating po's - most likely restart coumadin tomorrow if tolerates full liquids.  Will also convert IV meds to po's at that time as well.   -Will need to call cardiology service when pt is discharged so they can arrange INR checks. -creatinine continues to improve at 1.24 today.  Pt with condom cath.  Continues to have good uop.     Doreatha Massed, PA-C [facility] [phone] 02/24/2019 9:26 AM  I have seen and evaluated the patient. I agree with the PA note as documented above. Tolerated CLD yesterday with no nausea or fullness.  Will advance to full liquids today.  Incisions from aortobifem c/d/i.  Continue TPN until tolerating PO.  Heparin for hx afib.    Cephus Shelling, MD [facility] [place] Office: [phone] Pager: [phone]

## 2019-02-24 NOTE — Progress Notes (Addendum)
Patient ordered TPN  TPN Compounder currently unable to pump TPN tonight  TPN has max Na in it and patient's current Na is 136  Plan: DC TPN D10NS % 90 mL/hr until next bag of TPN  Isaac Bliss, PharmD, BCPS, BCCCP Clinical Pharmacist [phone]  Please check AMION for all [facility] Pharmacy numbers  02/24/2019 5:19 PM

## 2019-02-24 NOTE — Progress Notes (Signed)
ANTICOAGULATION CONSULT NOTE - Follow Up Consult  Pharmacy Consult for Heparin Indication: atrial fibrillation  No Known Allergies  Patient Measurements: Height: 6\' 5"  (195.6 cm) Weight: 177 lb 4 oz (80.4 kg) IBW/kg (Calculated) : 89.1 Heparin Dosing Weight: 91.3 kg  Vital Signs: Temp: 97.3 F (36.3 C) (03/08 0800) Temp Source: Oral (03/08 0800) BP: 113/62 (03/08 0400) Pulse Rate: 54 (03/08 0400)  Labs: Recent Labs    02/22/19 0325 02/23/19 0515 02/24/19 0440  HGB 9.3* 9.6* 9.4*  HCT 30.0* 32.0* 31.7*  PLT 312 305 291  HEPARINUNFRC 0.44 0.29* 0.42  CREATININE 1.29* 1.27* 1.24    Estimated Creatinine Clearance: 63.9 mL/min (by C-G formula based on SCr of 1.24 mg/dL).  Assessment: Chronic Coumadin for afib, bridged with Lovenox PTA. S/p 2/10 Aortobifemoral bypass with bilateral common femoral endarterectomy and profundoplasty, bilateral iliac thrombectomy. - Warfarin home dose: 1.5mg  daily, last dose 2/12 (LD 2/12)  Heparin level remains therapeutic at 0.42, on 1700 units/hr. Hgb 9.4, plt 291. No s/sx of bleeding. No infusion issues.  Goal of Therapy:  Heparin level 0.3-0.7 units/ml Monitor platelets by anticoagulation protocol: Yes   Plan:  Continue heparin gtt at 1700 units/hr Daily heparin level, CBC, s/s bleeding F/u plans for oral AC restart  Sherron Monday, PharmD, BCCCP Clinical Pharmacist  Pager: [phone] Phone: [phone] 02/24/2019 12:15 PM

## 2019-02-24 NOTE — Progress Notes (Signed)
Inpatient Rehabilitation Admissions Coordinator  Patient with complicated medical course. Please place order for an inpt rehab consult. OT continues to rec an inpt rehab admit. Please advise.  Ottie Glazier, RN, MSN Rehab Admissions Coordinator [phone] 02/24/2019 2:49 PM

## 2019-02-24 NOTE — Progress Notes (Signed)
PHARMACY - ADULT TOTAL PARENTERAL NUTRITION CONSULT NOTE   Pharmacy Consult:  TPN Indication:  Prolonged ileus  Patient Measurements: Height: 6\' 5"  (195.6 cm) Weight: 177 lb 4 oz (80.4 kg) IBW/kg (Calculated) : 89.1 TPN AdjBW (KG): 84.4 Body mass index is 21.02 kg/m.  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  He presented on 01/28/19 for aorta bifemoral bypass graft, femoral endarterectomy and iliac thrombectomy.  Patient was started on a clear liquid diet post-op but intake was inadequate.  NG tube placed on 01/31/19 and patient was made NPO again.  He was started on clears on 02/05/19 and vomitted.  Pharmacy consulted to manage TPN for nutrition support.    Patient has aphasia and reports he eats primarily soup and was eating regularly up until the day of admission.  He has minimal to no intake for ~10 days and is at risk for refeeding.  GI: Has persistent ileus- BM 3/7. Attempting clears with plan for full liquids if tolerated over 48h, likely 3/9. Prealbumin improved to 15.3, NG tube d/c'ed on 3/4. PPI IV, PRN Zofran. Endo: no hx DM - CBGs controlled. No SSI required. Lytes: K 4.1 (goal > 4 in ileus), others wnl Renal: SCr 1.24, D5NS at 20/hr, UOP 1.34ml/kg/hr, foley 2/21 CT showed left hydronephrosis Pulm: stable on RA Cards: BP stable, was in and out of Afib, has been SB with HR 50s-60s- off amiodarone gtt and ASA AC: Heparin for Afib - H/H improving.  2/21 CT showed RP hematoma Hepatobil: LFTs improved, Tbili wnl. Trig wnl. Neuro: PVD/CVA - PRN Dilaudid ID: Enterobacter aerogenes bacteremia sensitive cefepime. Antibiotic D#12. Now afebrile, WBC down. PICC pulled 2/27 due to fever. CVC placed 2/28. Cath tip cx - intermediate to Zosyn.  Due to conflicting sensitivities, changed to cefepime 3/2  TPN Access: New CVC triple lumen placed on 2/28 TPN start date: 02/06/19  Nutritional Goals (RD rec 2/27): 2200-2400 kCal, 110-125g protein, >/= 2.2L fluid per  day  Current Nutrition: Clears TPN  Plan:  Continue TPN at 36mL/hr This TPN provides 121g AA, 324g CHO and 69g ILE for a total of 2,275 kCal, meeting 100% of patient's needs. Electrolytes in TPN: max Na, Cl:Ac 1:2 Daily multivitamin and trace elements in TPN Continue D5NS at 5mL/hr per MD TPN labs in AM F/u start of full liquid diet and ability to wean TPN  Daylene Posey, PharmD Clinical Pharmacist Please check AMION for all [facility] Pharmacy numbers 02/24/2019 6:55 AM

## 2019-02-25 DIAGNOSIS — Z9889 Other specified postprocedural states: Secondary | ICD-10-CM

## 2019-02-25 DIAGNOSIS — D62 Acute posthemorrhagic anemia: Secondary | ICD-10-CM

## 2019-02-25 DIAGNOSIS — K567 Ileus, unspecified: Secondary | ICD-10-CM

## 2019-02-25 DIAGNOSIS — I495 Sick sinus syndrome: Secondary | ICD-10-CM

## 2019-02-25 DIAGNOSIS — I6930 Unspecified sequelae of cerebral infarction: Secondary | ICD-10-CM

## 2019-02-25 LAB — DIFFERENTIAL
Abs Immature Granulocytes: 0.09 10*3/uL — ABNORMAL HIGH (ref 0.00–0.07)
Basophils Absolute: 0.1 10*3/uL (ref 0.0–0.1)
Basophils Relative: 1 %
EOS PCT: 6 %
Eosinophils Absolute: 0.5 10*3/uL (ref 0.0–0.5)
Immature Granulocytes: 1 %
Lymphocytes Relative: 16 %
Lymphs Abs: 1.3 10*3/uL (ref 0.7–4.0)
Monocytes Absolute: 0.7 10*3/uL (ref 0.1–1.0)
Monocytes Relative: 8 %
Neutro Abs: 5.5 10*3/uL (ref 1.7–7.7)
Neutrophils Relative %: 68 %

## 2019-02-25 LAB — CBC
HCT: 31.2 % — ABNORMAL LOW (ref 39.0–52.0)
Hemoglobin: 9.4 g/dL — ABNORMAL LOW (ref 13.0–17.0)
MCH: 29.8 pg (ref 26.0–34.0)
MCHC: 30.1 g/dL (ref 30.0–36.0)
MCV: 99 fL (ref 80.0–100.0)
Platelets: 276 10*3/uL (ref 150–400)
RBC: 3.15 MIL/uL — ABNORMAL LOW (ref 4.22–5.81)
RDW: 17.7 % — ABNORMAL HIGH (ref 11.5–15.5)
WBC: 8.2 10*3/uL (ref 4.0–10.5)
nRBC: 0 % (ref 0.0–0.2)

## 2019-02-25 LAB — COMPREHENSIVE METABOLIC PANEL
ALT: 27 U/L (ref 0–44)
AST: 23 U/L (ref 15–41)
Albumin: 2.5 g/dL — ABNORMAL LOW (ref 3.5–5.0)
Alkaline Phosphatase: 102 U/L (ref 38–126)
Anion gap: 5 (ref 5–15)
BUN: 18 mg/dL (ref 8–23)
CO2: 24 mmol/L (ref 22–32)
Calcium: 8.3 mg/dL — ABNORMAL LOW (ref 8.9–10.3)
Chloride: 104 mmol/L (ref 98–111)
Creatinine, Ser: 1.25 mg/dL — ABNORMAL HIGH (ref 0.61–1.24)
GFR calc Af Amer: >60 mL/min (ref >60)
GFR calc non Af Amer: 58 mL/min — ABNORMAL LOW (ref >60)
Glucose, Bld: 105 mg/dL — ABNORMAL HIGH (ref 70–99)
Potassium: 4 mmol/L (ref 3.5–5.1)
Sodium: 133 mmol/L — ABNORMAL LOW (ref 135–145)
Total Bilirubin: 1.1 mg/dL (ref 0.3–1.2)
Total Protein: 6.8 g/dL (ref 6.5–8.1)

## 2019-02-25 LAB — GLUCOSE, CAPILLARY
Glucose-Capillary: 100 mg/dL — ABNORMAL HIGH (ref 70–99)
Glucose-Capillary: 101 mg/dL — ABNORMAL HIGH (ref 70–99)
Glucose-Capillary: 110 mg/dL — ABNORMAL HIGH (ref 70–99)
Glucose-Capillary: 111 mg/dL — ABNORMAL HIGH (ref 70–99)
Glucose-Capillary: 113 mg/dL — ABNORMAL HIGH (ref 70–99)
Glucose-Capillary: 96 mg/dL (ref 70–99)

## 2019-02-25 LAB — PREALBUMIN: Prealbumin: 23.8 mg/dL (ref 18–38)

## 2019-02-25 LAB — PHOSPHORUS: PHOSPHORUS: 3.2 mg/dL (ref 2.5–4.6)

## 2019-02-25 LAB — HEPARIN LEVEL (UNFRACTIONATED): Heparin Unfractionated: 0.19 IU/mL — ABNORMAL LOW (ref 0.30–0.70)

## 2019-02-25 LAB — MAGNESIUM: Magnesium: 1.8 mg/dL (ref 1.7–2.4)

## 2019-02-25 LAB — TRIGLYCERIDES: Triglycerides: 44 mg/dL (ref <150)

## 2019-02-25 MED ORDER — WARFARIN - PHARMACIST DOSING INPATIENT
Freq: Every day | Status: DC
  Administered 2019-02-25 – 2019-02-26 (×2)

## 2019-02-25 MED ORDER — DOCUSATE SODIUM 100 MG PO CAPS
100.0000 mg | ORAL_CAPSULE | Freq: Every day | ORAL | Status: DC
  Administered 2019-02-25 – 2019-02-28 (×4): 100 mg via ORAL
  Filled 2019-02-25 (×4): qty 1

## 2019-02-25 MED ORDER — METOPROLOL TARTRATE 25 MG PO TABS
25.0000 mg | ORAL_TABLET | Freq: Every day | ORAL | Status: DC
  Administered 2019-02-25 – 2019-02-28 (×4): 25 mg via ORAL
  Filled 2019-02-25 (×4): qty 1

## 2019-02-25 MED ORDER — ATORVASTATIN CALCIUM 10 MG PO TABS
20.0000 mg | ORAL_TABLET | Freq: Every day | ORAL | Status: DC
  Administered 2019-02-25 – 2019-02-28 (×4): 20 mg via ORAL
  Filled 2019-02-25 (×4): qty 2

## 2019-02-25 MED ORDER — ASPIRIN EC 81 MG PO TBEC
81.0000 mg | DELAYED_RELEASE_TABLET | Freq: Every day | ORAL | Status: DC
  Administered 2019-02-25 – 2019-02-28 (×4): 81 mg via ORAL
  Filled 2019-02-25 (×4): qty 1

## 2019-02-25 MED ORDER — WARFARIN SODIUM 1 MG PO TABS
1.5000 mg | ORAL_TABLET | Freq: Every day | ORAL | Status: DC
  Administered 2019-02-25 – 2019-02-27 (×3): 1.5 mg via ORAL
  Filled 2019-02-25 (×4): qty 1

## 2019-02-25 MED ORDER — MAGNESIUM HYDROXIDE 400 MG/5ML PO SUSP
5.0000 mL | Freq: Every day | ORAL | Status: DC | PRN
  Administered 2019-02-26: 5 mL via ORAL
  Filled 2019-02-25 (×2): qty 30

## 2019-02-25 MED ORDER — TRAVASOL 10 % IV SOLN
INTRAVENOUS | Status: DC
  Administered 2019-02-25: 19:00:00 via INTRAVENOUS
  Filled 2019-02-25: qty 1209.6

## 2019-02-25 NOTE — Progress Notes (Signed)
ANTICOAGULATION CONSULT NOTE - Follow Up Consult  Pharmacy Consult for heparin Indication: atrial fibrillation  Labs: Recent Labs    02/23/19 0515 02/24/19 0440 02/25/19 0431 02/25/19 0438  HGB 9.6* 9.4*  --  9.4*  HCT 32.0* 31.7*  --  31.2*  PLT 305 291  --  276  HEPARINUNFRC 0.29* 0.42 0.19*  --   CREATININE 1.27* 1.24  --  1.25*    Assessment: 67yo male subtherapeutic on heparin after one level at goal at current rate; pt has been fluctuating around rates 1600-1800 units/hr; no gtt issues or signs of bleeding per RN.  Goal of Therapy:  Heparin level 0.3-0.7 units/ml   Plan:  Will increase heparin gtt to 1800 units/hr and check level in 6 hours.    Vernard Gambles, PharmD, BCPS  02/25/2019,6:36 AM

## 2019-02-25 NOTE — Consult Note (Signed)
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  He was admitted on 01/28/2019 for aortobifemoral bypass with B-CFA endartectomy and fundoplasty and B-iliac thrombectomy  by Dr. Darrick Penna. Post op developed abdominal pain due to ileus, A fib with RVR, hypotension and AKI with decreased UOP.  He was started on IVF for hydration and started on IV amiodarone for rate control.   Dr. Glenna Fellows consulted for input on renal issues and recommended changing fluids to bicarb for management of metabolic acidosis. She felt that AKI due to ATN and to continue monitor UOP.  NGT placed to manage ileus and abdominal CT done for work up due to ongoing pain. This revealed retroperitoneal hematoma--he was transfused with with 2 units PRBC for ABLA and H/H has been relatively stable. Renal status improved with increase in UOP and nephrology signed off. He continues to have issues intermittent issue with A flutter--rate control and no need for amiodarone. Is in process of being transitioned from IV heparin to coumadin. He has been maintained on clears with bowel program. Advanced to regular this am. Therapy ongoing and OT recommending  [name] recommended due to functional decline and debility.     Review of Systems  Unable to perform ROS: Mental acuity     Past Medical History:  Diagnosis Date  . Afib (HCC)    a. Paroxysmal --> previous issues with bradycardia while on Amiodarone and he refused PPM placement. Rate-control strategy pursued.   . Aortic stenosis   . Chronic kidney disease    unknown  problem to see kidney  doctor in March in [place]  . Dysrhythmia    Atril Fib  . Peripheral vascular disease (HCC)   . Stroke [facility])    Aphasia, right sided weakness     Past Surgical History:  Procedure Laterality Date  . AORTA - BILATERAL FEMORAL ARTERY BYPASS GRAFT Bilateral 01/28/2019   Procedure: AORTA BIFEMORAL BYPASS GRAFT;  Surgeon: Sherren Kerns, MD;  Location: [facility] OR;  Service: Vascular;  Laterality: Bilateral;  . AORTA BIFEMORAL BYPASS GRAFT  01/28/2019  . DIALYSIS/PERMA CATHETER INSERTION N/A 02/15/2019   Procedure: DIALYSIS/PERMA CATHETER INSERTION;  Surgeon: Sherren Kerns, MD;  Location: [facility] INVASIVE CV LAB;  Service: Cardiovascular;  Laterality: N/A;  . EMBOLECTOMY Right 08/30/2017   Procedure: Right popliteal tibial endarectomy with patch angioplasty;  Surgeon: Sherren Kerns, MD;  Location: [facility] OR;  Service: Vascular;  Laterality: Right;  . ENDARTERECTOMY FEMORAL Bilateral 01/28/2019   Procedure: BILATERAL FEMORAL ENDARTERECTOMY WITH PROFUNDAPLASTY;  Surgeon: Sherren Kerns, MD;  Location: [facility] OR;  Service: Vascular;  Laterality: Bilateral;  . FASCIOTOMY CLOSURE Bilateral 08/28/2017   Procedure: FASCIOTOMY CLOSURE/BILATERAL  lower legs;  Surgeon: Sherren Kerns, MD;  Location: [facility] OR;  Service: Vascular;  Laterality: Bilateral;  . FEMORAL-POPLITEAL BYPASS GRAFT Bilateral 08/26/2017   Procedure: Bilateral FEMORAL EMBOLECTOMY with  BILATERAL Four Compartment  FASCIOTOMIES.;  Surgeon: Sherren Kerns, MD;  Location: [facility] OR;  Service: Vascular;  Laterality: Bilateral;  . FEMORAL-POPLITEAL BYPASS GRAFT Bilateral 01/28/2019   Procedure: BILATERAL  ILIAC THROMBECTOMY;  Surgeon: Sherren Kerns, MD;  Location:  [facility] OR;  Service: Vascular;  Laterality: Bilateral;    Family History  Problem Relation Age of Onset  . Stroke Sister     Social History:  Lives alone in [place]. Used to work as a Merchandiser, retail. Has family in town who checks in ? He indicated that he was  independent with quad cane PTA.  He reports that he has never smoked. He has never used smokeless tobacco. He reports that he does not drink alcohol or use drugs.    Allergies: No Known Allergies    Medications Prior to Admission  Medication Sig Dispense Refill  . aspirin 325 MG tablet Take 325 mg by mouth daily.    Marland Kitchen atorvastatin (LIPITOR) 20 MG tablet Take 1 tablet (20 mg total) by mouth daily. 90 tablet 3  . metoprolol tartrate (LOPRESSOR) 25 MG tablet Take 25 mg by mouth daily.    Marland Kitchen warfarin (COUMADIN) 1 MG tablet Take 1.5 mg by mouth daily.      Home: Home Living Family/patient expects to be discharged to:: Private residence Living Arrangements: Alone Available Help at Discharge: Family, Available PRN/intermittently(May be able to provide increased support at dc) Type of Home: House Home Access: Stairs to enter [employer] of Steps: 2-3 front; 3-4 back Entrance Stairs-Rails: None Home Layout: One level Bathroom Shower/Tub: Engineer, manufacturing systems: Standard Home Equipment: Environmental consultant - 2 wheels, [employer] - quad, Information systems manager, Grab bars - tub/shower Additional Comments: one dog   Functional History: Prior Function Level of Independence: Independent Comments: ADLs, IADLs, driving. Mother recently passed away amonth ago  Functional Status:  Mobility: Bed Mobility Overal bed mobility: Needs Assistance Bed Mobility: Supine to Sit Rolling: Min assist(+ rail) Sidelying to sit: Min assist(+ rail) Supine to sit: Mod assist, HOB elevated Sit to supine: +2 for physical assistance, Max assist General bed mobility comments: Pt up in chair Transfers Overall transfer level: Needs assistance Equipment used: Rolling walker (2 wheeled) Transfer via Lift Equipment: Stedy Transfers: Sit to/from [employer] to Stand: Min assist Stand pivot transfers: Mod assist General transfer comment: Assist to bring hips up and for balance. Assist to get lt hand on  walker Ambulation/Gait Ambulation/Gait assistance: Min assist, +2 safety/equipment Gait Distance (Feet): 150 Feet Assistive device: Rolling walker (2 wheeled) Gait Pattern/deviations: Shuffle, Decreased stride length, Narrow base of support, Step-through pattern General Gait Details: Assist for balance. Verbal cues to widen gait and stand more erect Gait velocity: decreased Gait velocity interpretation: <1.31 ft/sec, indicative of household ambulator    ADL: ADL Overall ADL's : Needs assistance/impaired Eating/Feeding: NPO Grooming: Oral care, Minimal assistance, Sitting Grooming Details (indicate cue type and reason): Min A to perform bilateral tasks. Upper Body Bathing: Minimal assistance, Sitting Lower Body Bathing: Moderate assistance, Sit to/from stand Upper Body Dressing : Minimal assistance, Sitting Upper Body Dressing Details (indicate cue type and reason): front opening gown Lower Body Dressing: Maximal assistance, Sitting/lateral leans Lower Body Dressing Details (indicate cue type and reason): socks Toilet Transfer: Moderate assistance, Stand-pivot(simulated to recliner) Toilet Transfer Details (indicate cue type and reason): Mod A for power up into standing and then maintain balance in pivot to recliner.  Functional mobility during ADLs: Minimal assistance, +2 for safety/equipment, Rolling walker General ADL Comments: pt eager to work with therapies today, feeling much better  Cognition: Cognition Overall Cognitive Status: Within Functional Limits for tasks assessed Orientation Level: Oriented X4 Cognition Arousal/Alertness: Awake/alert Behavior During Therapy: WFL for tasks assessed/performed Overall Cognitive Status: Within Functional Limits for tasks assessed General Comments:  patient following commands consistently and communicating re: pain and functional status vis minimal verbalizations and hand gestures Difficult to assess due to: Impaired  communication   Blood pressure 107/72, pulse 63, temperature (!) 97.3 F (36.3 C), temperature source Oral, resp. rate 20, height 6\' 5"  (1.956 m), weight 80.4 kg, SpO2 95 %. Physical Exam  Nursing note and vitals reviewed. Constitutional: He appears well-developed and well-nourished.  HENT:  Head: Normocephalic and atraumatic.  Eyes: EOM are normal. Right eye exhibits no discharge. Left eye exhibits no discharge.  Neck: Normal range of motion. Neck supple.  Cardiovascular: Normal rate and regular rhythm.  Respiratory: Effort normal and breath sounds normal.  GI: Soft. Bowel sounds are normal.  Midline abdominal and bilateral inguinal incisions C/D/I.   Musculoskeletal:     Comments: No edema or tenderness in extremities  Neurological: He is alert.  Expressive deficits noted Some difficulty following commands: Grossly 4-/5 proximal to distal (left stronger than right)  Skin: Skin is warm and dry.  Psychiatric: His affect is blunt. His speech is delayed. Cognition and memory are impaired.    Results for orders placed or performed during the hospital encounter of 01/28/19 (from the past 48 hour(s))  Glucose, capillary     Status: Abnormal   Collection Time: 02/23/19  7:20 PM  Result Value Ref Range   Glucose-Capillary 105 (H) 70 - 99 mg/dL  Heparin level (unfractionated)     Status: None   Collection Time: 02/24/19  4:40 AM  Result Value Ref Range   Heparin Unfractionated 0.42 0.30 - 0.70 IU/mL    Comment: (NOTE) If heparin results are below expected values, and patient dosage has  been confirmed, suggest follow up testing of antithrombin III levels. Performed at [facility], [address], [place], [place] [address]   CBC     Status: Abnormal   Collection Time: 02/24/19  4:40 AM  Result Value Ref Range   WBC 8.4 4.0 - 10.5 K/uL   RBC 3.21 (L) 4.22 - 5.81 MIL/uL   Hemoglobin 9.4 (L) 13.0 - 17.0 g/dL   HCT 11.0 (L) 31.5 - 94.5 %   MCV 98.8 80.0 - 100.0 fL   MCH  29.3 26.0 - 34.0 pg   MCHC 29.7 (L) 30.0 - 36.0 g/dL   RDW 85.9 (H) 29.2 - 44.6 %   Platelets 291 150 - 400 K/uL   nRBC 0.0 0.0 - 0.2 %    Comment: Performed at [facility], [address], [place], [place] [address]  Basic metabolic panel     Status: Abnormal   Collection Time: 02/24/19  4:40 AM  Result Value Ref Range   Sodium 135 135 - 145 mmol/L   Potassium 4.1 3.5 - 5.1 mmol/L   Chloride 101 98 - 111 mmol/L   CO2 26 22 - 32 mmol/L   Glucose, Bld 99 70 - 99 mg/dL   BUN 22 8 - 23 mg/dL   Creatinine, Ser 1.77 0.61 - 1.24 mg/dL   Calcium 8.4 (L) 8.9 - 10.3 mg/dL   GFR calc non Af Amer 59 (L) >60 mL/min   GFR calc Af Amer >60 >60 mL/min   Anion gap 8 5 - 15    Comment: Performed at [facility], [address] [place], [place] [address]  Glucose, capillary     Status: Abnormal   Collection Time: 02/24/19  7:32 AM  Result Value Ref Range   Glucose-Capillary 108 (H) 70 - 99  mg/dL  Glucose, capillary     Status: Abnormal   Collection Time: 02/24/19  9:21 PM  Result Value Ref Range   Glucose-Capillary 103 (H) 70 - 99 mg/dL  Glucose, capillary     Status: None   Collection Time: 02/25/19 12:14 AM  Result Value Ref Range   Glucose-Capillary 96 70 - 99 mg/dL  Glucose, capillary     Status: Abnormal   Collection Time: 02/25/19  4:30 AM  Result Value Ref Range   Glucose-Capillary 100 (H) 70 - 99 mg/dL  Heparin level (unfractionated)     Status: Abnormal   Collection Time: 02/25/19  4:31 AM  Result Value Ref Range   Heparin Unfractionated 0.19 (L) 0.30 - 0.70 IU/mL    Comment: (NOTE) If heparin results are below expected values, and patient dosage has  been confirmed, suggest follow up testing of antithrombin III levels. Performed at [facility], [address] [place], [place] [address]   Triglycerides     Status: None   Collection Time: 02/25/19  4:37 AM  Result Value Ref Range   Triglycerides 44 <150 mg/dL    Comment: Performed at [facility], [address] [place], [place] [address]  Comprehensive metabolic panel     Status: Abnormal   Collection Time: 02/25/19  4:38 AM  Result Value Ref Range   Sodium 133 (L) 135 - 145 mmol/L   Potassium 4.0 3.5 - 5.1 mmol/L   Chloride 104 98 - 111 mmol/L   CO2 24 22 - 32 mmol/L   Glucose, Bld 105 (H) 70 - 99 mg/dL   BUN 18 8 - 23 mg/dL   Creatinine, Ser 0.98 (H) 0.61 - 1.24 mg/dL   Calcium 8.3 (L) 8.9 - 10.3 mg/dL   Total Protein 6.8 6.5 - 8.1 g/dL   Albumin 2.5 (L) 3.5 - 5.0 g/dL   AST 23 15 - 41 U/L   ALT 27 0 - 44 U/L   Alkaline Phosphatase 102 38 - 126 U/L   Total Bilirubin 1.1 0.3 - 1.2 mg/dL   GFR calc non Af Amer 58 (L) >60 mL/min   GFR calc Af Amer >60 >60 mL/min   Anion gap 5 5 - 15    Comment: Performed at [facility], [address] [place], [place] [address]  Magnesium     Status: None   Collection Time: 02/25/19  4:38 AM  Result Value Ref Range   Magnesium 1.8 1.7 - 2.4 mg/dL    Comment: Performed at [facility], [address] [place], [place] [address]  Phosphorus     Status: None   Collection Time: 02/25/19  4:38 AM  Result Value Ref Range   Phosphorus 3.2 2.5 - 4.6 mg/dL    Comment: Performed at [facility], [address], [place], [place] [address]  Differential     Status: Abnormal   Collection Time: 02/25/19  4:38 AM  Result Value Ref Range   Neutrophils Relative % 68 %   Neutro Abs 5.5 1.7 - 7.7 K/uL   Lymphocytes Relative 16 %   Lymphs Abs 1.3 0.7 - 4.0 K/uL   Monocytes Relative 8 %   Monocytes Absolute 0.7 0.1 - 1.0 K/uL   Eosinophils Relative 6 %   Eosinophils Absolute 0.5 0.0 - 0.5 K/uL   Basophils Relative 1 %   Basophils Absolute 0.1 0.0 - 0.1 K/uL   Immature Granulocytes 1 %   Abs Immature  Granulocytes 0.09 (H) 0.00 - 0.07 K/uL    Comment: Performed at [facility], [address] [place], [place] [address]  Prealbumin     Status: None   Collection Time: 02/25/19  4:38 AM  Result Value Ref Range   Prealbumin  23.8 18 - 38 mg/dL    Comment: Performed at [facility], [address] [place], [place] [address]  CBC     Status: Abnormal   Collection Time: 02/25/19  4:38 AM  Result Value Ref Range   WBC 8.2 4.0 - 10.5 K/uL   RBC 3.15 (L) 4.22 - 5.81 MIL/uL   Hemoglobin 9.4 (L) 13.0 - 17.0 g/dL   HCT 10.3 (L) 01.3 - 14.3 %   MCV 99.0 80.0 - 100.0 fL   MCH 29.8 26.0 - 34.0 pg   MCHC 30.1 30.0 - 36.0 g/dL   RDW 88.8 (H) 75.7 - 97.2 %   Platelets 276 150 - 400 K/uL   nRBC 0.0 0.0 - 0.2 %    Comment: Performed at [facility], [address], [place], [place] [address]  Glucose, capillary     Status: Abnormal   Collection Time: 02/25/19  8:20 AM  Result Value Ref Range   Glucose-Capillary 101 (H) 70 - 99 mg/dL   Comment 1 Notify RN    No results found.   Assessment/Plan: Diagnosis: Debility Labs independently reviewed.  Records reviewed and summated above.  1. Does the need for close, 24 hr/day medical supervision in concert with the patient's rehab needs make it unreasonable for this patient to be served in a less intensive setting? Potentially  2. Co-Morbidities requiring supervision/potential complications: A fib/tachy brady syndrome- refused PPM (continue meds-transition from heparin ggt to oral meds, monitor heart rate with increased mobility, CVA-residual aphasia with right sided weakness (see previous), CKD (avoid nephrotoxic meds), PVD s/p with B-FPBG with fasciotomy 08/2017, ABLA (repeat labs, consider transfusion if necessary to ensure appropriate perfusion for increased activity tolerance) 3. Due to bladder management, safety, disease management, medication administration, pain management and patient education, does the patient require 24 hr/day rehab nursing? Yes 4. Does the patient require coordinated care of a physician, rehab nurse, PT (1-2 hrs/day, 5 days/week), OT (1-2 hrs/day, 5 days/week) and SLP (1-2 hrs/day, 5 days/week) to address physical and functional deficits in  the context of the above medical diagnosis(es)? Yes Addressing deficits in the following areas: balance, endurance, locomotion, strength, transferring, bathing, dressing, toileting, cognition, speech, language and psychosocial support 5. Can the patient actively participate in an intensive therapy program of at least 3 hrs of therapy per day at least 5 days per week? Potentially 6. The potential for patient to make measurable gains while on inpatient rehab is excellent and good 7. Anticipated functional outcomes upon discharge from inpatient rehab are supervision  with PT, supervision with OT, supervision with SLP. 8. Estimated rehab length of stay to reach the above functional goals is: 8-12 days. 9. Anticipated D/C setting: Home 10. Anticipated post D/C treatments: HH therapy and Home excercise program 11. Overall Rehab/Functional Prognosis: good  RECOMMENDATIONS: This patient's condition is appropriate for continued rehabilitative care in the following setting: Patient has not been seen by therapies in several days, recommend re-eval.  If persistent deficits present, recommend [name] if caregiver support available upon discharge when medically stable. Patient has agreed to participate in recommended program. Potentially Note that insurance prior authorization may be required for reimbursement for recommended care.  Comment: Rehab Admissions  Coordinator to follow up.   I have personally performed a face to face diagnostic evaluation, including, but not limited to relevant history and physical exam findings, of this patient and developed relevant assessment and plan.  Additionally, I have reviewed and concur with the physician assistant's documentation above.   Maryla MorrowAnkit Terel Bann, MD, ABPMR Jacquelynn CreePamela S Love, PA-C 02/25/2019

## 2019-02-25 NOTE — Progress Notes (Addendum)
PHARMACY - ADULT TOTAL PARENTERAL NUTRITION CONSULT NOTE   Pharmacy Consult:  TPN Indication:  Prolonged ileus  Patient Measurements: Height: 6\' 5"  (195.6 cm) Weight: 177 lb 4 oz (80.4 kg) IBW/kg (Calculated) : 89.1 TPN AdjBW (KG): 84.4 Body mass index is 21.02 kg/m.  Assessment:  89 YOM with history of LE weakness and CVA from AFib.  He presented on 01/28/19 for aorta bifemoral bypass graft, femoral endarterectomy and iliac thrombectomy.  Patient was started on a clear liquid diet post-op but intake was inadequate.  NG tube placed on 01/31/19 and patient was made NPO again.  He was started on clears on 02/05/19 and vomitted.  Pharmacy consulted to manage TPN for nutrition support.    Patient has aphasia and reports he eats primarily soup and was eating regularly up until the day of admission.  He has minimal to no intake for ~10 days and is at risk for refeeding.  GI: Has persistent ileus- BM 3/7.Tolerated full liquids yesterday, advance to heart healthy diet today.  If he tolerates will stop TPN tomorrow (3/10). Prealbumin improved to 23.8, NG tube d/c'ed on 3/4. PPI IV, PRN Zofran. Endo: no hx DM - CBGs controlled. No SSI required. Lytes: K 4 (goal > 4 in ileus), others wnl Renal: SCr 1.24, UOP 1.4 ml/kg/hr, foley 2/21 CT showed left hydronephrosis Pulm: stable on RA Cards: BP stable, was in and out of Afib, has been SB with HR 50s-60s- off amiodarone gtt and ASA AC: Heparin for Afib - H/H improving.  2/21 CT showed RP hematoma Hepatobil: LFTs improved, Tbili wnl. Trig wnl. Neuro: PVD/CVA - PRN Dilaudid ID: Enterobacter aerogenes bacteremia sensitive cefepime. Antibiotic D#12. Now afebrile, WBC down. PICC pulled 2/27 due to fever. CVC placed 2/28. Cath tip cx - intermediate to Zosyn.  Due to conflicting sensitivities, changed to cefepime 3/2  TPN Access: New CVC triple lumen placed on 2/28 TPN start date: 02/06/19  Nutritional Goals (RD rec 2/27): 2200-2400 kCal, 110-125g protein,  >/= 2.2L fluid per day  Current Nutrition: Tolerated full liquids yesterday, advance to heart healthy diet today.  If he tolerates will stop TPN tomorrow (3/10). TPN  Plan:  Restart TPN at 71mL/hr This TPN provides 121g AA, 324g CHO and 69g ILE for a total of 2,275 kCal, meeting 100% of patient's needs. Electrolytes in TPN: max Na, Cl:Ac 1:2 Daily multivitamin and trace elements in TPN Continue D5NS at 20 ml/hr per MD D/C D10NS when new TPN hung CMP in AM F/u start of heart healthy diet and ability to wean TPN   Jeanella Cara, PharmD, [facility] Clinical Pharmacist Please see AMION for all Pharmacists' Contact Phone Numbers 02/25/2019, 9:38 AM

## 2019-02-25 NOTE — Care Management Important Message (Signed)
Important Message  Patient Details  Name: [name] [name] MRN: [id number] Date of Birth: 1951/05/03   Medicare Important Message Given:  Yes    Carolynne Schuchard P Demetris Capell 02/25/2019, 2:46 PM

## 2019-02-25 NOTE — Progress Notes (Addendum)
  Progress Note    02/25/2019 7:42 AM 10 Days Post-Op  Subjective:  No nausea/vomiting with full liquids yesterday.  Flatus but no bowel movements yesterday and overnight   Vitals:   02/24/19 1952 02/25/19 0432  BP: 112/72 113/71  Pulse: 65 63  Resp: 20 20  Temp: 97.9 F (36.6 C) 98.1 F (36.7 C)  SpO2: 99% 96%   Physical Exam: Lungs:  Non labored Incisions:  abd and groin incisions healed Extremities:  Palpable AT pulses bilaterally Abdomen:  Soft, NT, ND Neurologic: A&O  CBC    Component Value Date/Time   WBC 8.2 02/25/2019 0438   RBC 3.15 (L) 02/25/2019 0438   HGB 9.4 (L) 02/25/2019 0438   HGB 15.7 09/27/2017 0930   HCT 31.2 (L) 02/25/2019 0438   HCT 46.8 09/27/2017 0930   PLT 276 02/25/2019 0438   PLT 275 09/27/2017 0930   MCV 99.0 02/25/2019 0438   MCV 94 09/27/2017 0930   MCH 29.8 02/25/2019 0438   MCHC 30.1 02/25/2019 0438   RDW 17.7 (H) 02/25/2019 0438   RDW 14.8 09/27/2017 0930   LYMPHSABS 1.3 02/25/2019 0438   MONOABS 0.7 02/25/2019 0438   EOSABS 0.5 02/25/2019 0438   BASOSABS 0.1 02/25/2019 0438    BMET    Component Value Date/Time   NA 133 (L) 02/25/2019 0438   NA 140 09/27/2017 0930   K 4.0 02/25/2019 0438   CL 104 02/25/2019 0438   CO2 24 02/25/2019 0438   GLUCOSE 105 (H) 02/25/2019 0438   BUN 18 02/25/2019 0438   BUN 14 09/27/2017 0930   CREATININE 1.25 (H) 02/25/2019 0438   CALCIUM 8.3 (L) 02/25/2019 0438   GFRNONAA 58 (L) 02/25/2019 0438   GFRAA >60 02/25/2019 0438    INR    Component Value Date/Time   INR 1.3 (H) 02/13/2019 0402     Intake/Output Summary (Last 24 hours) at 02/25/2019 0742 Last data filed at 02/25/2019 0450 Gross per 24 hour  Intake 1786.41 ml  Output 2675 ml  Net -888.59 ml     Assessment/Plan:  68 y.o. male is s/p Aortobifemoral bypass with bilateral common femoral endarterectomy and profundoplasty, bilateral iliac thrombectomy 27 Days Post-Op  Perfusing BLE well Tolerating full liquids, no BM  yesterday or day prior PT recommending SNF; OT recommending [name]; will consult CSW for placement as well Renal function and UOP stable Will need to transition to p.o meds   Emilie Rutter, PA-C [facility] [phone] 02/25/2019 7:42 AM  Agree with above Resume home meds today metoprolol statin warfarin aspirin Advance to heart healthy diet.  If he tolerates will stop TPN tomorrow. Will need INR > 2 prior to d/c  Start disposition planning SNF vs Rehab Bowel regimen colace daily Milk of Mag prn  [name], MD [facility] [place] Office: [phone] Pager: [phone]

## 2019-02-25 NOTE — Progress Notes (Addendum)
Inpatient Rehabilitation Admissions Coordinator  I met with patient at bedside to begin discussion of goals and expectations of an inpt rehab admit. Pt asks me to call his sister. I left her a voicemail. I will follow up tomorrow once I have spoken with sister or brother as well as need updated P.T. recommendations. Noted OT recommends [name].  Danne Baxter, RN, MSN Rehab Admissions Coordinator [phone] 02/25/2019 2:15 PM  I met with patient and his sister at bedside. She plans to move in with patient at d/c to provide the supervision that we anticipate he will need. I await updated therapy progress and then will pursue insurance authorization with [facility] for a possible inpt rehab admit.  Danne Baxter, RN, MSN Rehab Admissions Coordinator [phone] 02/25/2019 3:30 PM

## 2019-02-26 LAB — HEPARIN LEVEL (UNFRACTIONATED): Heparin Unfractionated: 0.49 IU/mL (ref 0.30–0.70)

## 2019-02-26 LAB — COMPREHENSIVE METABOLIC PANEL
ALT: 29 U/L (ref 0–44)
AST: 24 U/L (ref 15–41)
Albumin: 2.4 g/dL — ABNORMAL LOW (ref 3.5–5.0)
Alkaline Phosphatase: 105 U/L (ref 38–126)
Anion gap: 5 (ref 5–15)
BUN: 18 mg/dL (ref 8–23)
CO2: 24 mmol/L (ref 22–32)
Calcium: 8.2 mg/dL — ABNORMAL LOW (ref 8.9–10.3)
Chloride: 105 mmol/L (ref 98–111)
Creatinine, Ser: 1.31 mg/dL — ABNORMAL HIGH (ref 0.61–1.24)
GFR calc Af Amer: >60 mL/min (ref >60)
GFR calc non Af Amer: 55 mL/min — ABNORMAL LOW (ref >60)
Glucose, Bld: 94 mg/dL (ref 70–99)
Potassium: 4 mmol/L (ref 3.5–5.1)
SODIUM: 134 mmol/L — AB (ref 135–145)
Total Bilirubin: 0.9 mg/dL (ref 0.3–1.2)
Total Protein: 6.7 g/dL (ref 6.5–8.1)

## 2019-02-26 LAB — CBC
HCT: 30.6 % — ABNORMAL LOW (ref 39.0–52.0)
Hemoglobin: 9.4 g/dL — ABNORMAL LOW (ref 13.0–17.0)
MCH: 29.7 pg (ref 26.0–34.0)
MCHC: 30.7 g/dL (ref 30.0–36.0)
MCV: 96.8 fL (ref 80.0–100.0)
Platelets: 245 10*3/uL (ref 150–400)
RBC: 3.16 MIL/uL — ABNORMAL LOW (ref 4.22–5.81)
RDW: 17.3 % — AB (ref 11.5–15.5)
WBC: 9.3 10*3/uL (ref 4.0–10.5)
nRBC: 0 % (ref 0.0–0.2)

## 2019-02-26 LAB — PROTIME-INR
INR: 1.1 (ref 0.8–1.2)
Prothrombin Time: 14.1 seconds (ref 11.4–15.2)

## 2019-02-26 LAB — GLUCOSE, CAPILLARY
Glucose-Capillary: 104 mg/dL — ABNORMAL HIGH (ref 70–99)
Glucose-Capillary: 107 mg/dL — ABNORMAL HIGH (ref 70–99)
Glucose-Capillary: 118 mg/dL — ABNORMAL HIGH (ref 70–99)
Glucose-Capillary: 81 mg/dL (ref 70–99)
Glucose-Capillary: 92 mg/dL (ref 70–99)

## 2019-02-26 MED ORDER — PANTOPRAZOLE SODIUM 40 MG PO TBEC
40.0000 mg | DELAYED_RELEASE_TABLET | Freq: Every day | ORAL | Status: DC
  Administered 2019-02-26 – 2019-02-28 (×3): 40 mg via ORAL
  Filled 2019-02-26 (×3): qty 1

## 2019-02-26 NOTE — Progress Notes (Signed)
OT Treatment Note  Pt making excellent progress. Able to participate in ADL session after working with PT. Improved endurance for ADL and mobility. Overall Min A for bathing and UB dressing. Mod A with LB dressing and min A for functional transfers. Will further address functional use of RUE and assess with kinesiotaping to increase extension ROM and functional use of hand. Feel pt can reach modified independent level at [name]. Excellent participation and very motivated.     02/26/19 1000  OT Visit Information  Last OT Received On 02/26/19  Assistance Needed +1  History of Present Illness Pt s/p Aortobifemoral bypass on 2/10. Received central line 2/28. Pt developed afib with rvr, acute renal failure and ileus 2/12-2/13. Pt had NG tube for ileus that was removed 2/17 and had to be replaced 2/19. 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   Precautions  Precautions Fall  Pain Assessment  Pain Assessment No/denies pain  Cognition  Arousal/Alertness Awake/alert  Behavior During Therapy WFL for tasks assessed/performed  Overall Cognitive Status Within Functional Limits for tasks assessed  Upper Extremity Assessment  RUE Deficits / Details RUE weakness; isolated movements out of synergy pattern; poor scapular glide limiting R shoulder function; R 3,4,5 flexion contratures of PIP joints @ 30-40 degrees. . Able to passively range PIPs into @ 20 degree extension lag; dominant UE prior to CVA; encouraged to use   RUE Coordination decreased fine motor;decreased gross motor  Lower Extremity Assessment  Lower Extremity Assessment Defer to PT evaluation  ADL  Eating/Feeding Details (indicate cue type and reason) beginning to drink liquids; assist with set up  Grooming Sitting;Minimal assistance  Grooming Details (indicate cue type and reason) A to comb hair  Upper Body Bathing Minimal assistance;Sitting  Lower Body Bathing Minimal assistance;Sit to/from stand  Upper  Body Dressing  Minimal assistance;Sitting  Lower Body Dressing Moderate assistance;Sit to/from Scientist, research (life sciences) Minimal assistance;RW  Toileting- Clothing Manipulation and Hygiene Moderate assistance  Toileting - Clothing Manipulation Details (indicate cue type and reason) condom cath  Functional mobility during ADLs Minimal assistance;Rolling walker  General ADL Comments Improved endurance during ADL tasks  Bed Mobility  General bed mobility comments OOB in chair  Balance  Sitting balance-Leahy Scale Fair  Standing balance-Leahy Scale Poor  Standing balance comment able to release RW with L hand to assist with peri-care  Transfers  Overall transfer level Needs assistance  Equipment used Rolling walker (2 wheeled)  Transfers Sit to/from Stand  Sit to Stand Min assist  Exercises  Exercises Other exercises  Other Exercises  Other Exercises passive stretching R 3-5 digits into composite extension  OT - End of Session  Equipment Utilized During Treatment Rolling walker  Activity Tolerance Patient tolerated treatment well  Patient left in chair;with call bell/phone within reach;with chair alarm set  Nurse Communication Mobility status  OT Assessment/Plan  OT Plan Discharge plan remains appropriate  OT Visit Diagnosis Unsteadiness on feet (R26.81);Other abnormalities of gait and mobility (R26.89);Muscle weakness (generalized) (M62.81)  Pain - Right/Left Right  Pain - part of body Arm (DURING rom)  OT Frequency (ACUTE ONLY) Min 3X/week  Follow Up Recommendations [name];Supervision/Assistance - 24 hour  OT Equipment Other (comment) (tba)  AM-PAC OT "6 Clicks" Daily Activity Outcome Measure (Version 2)  Help from another person eating meals? 3  Help from another person taking care of personal grooming? 3  Help from another person toileting, which includes using toliet, bedpan, or urinal? 3  Help  from another person bathing (including washing, rinsing, drying)? 3  Help from another  person to put on and taking off regular upper body clothing? 3  Help from another person to put on and taking off regular lower body clothing? 2  6 Click Score 17  OT Goal Progression  Progress towards OT goals Progressing toward goals;OT to reassess next treatment (goals remain appropirate)  Acute Rehab OT Goals  Patient Stated Goal To be independent  OT Goal Formulation With patient  Time For Goal Achievement 02/26/19  Potential to Achieve Goals Good  ADL Goals  Pt Will Perform Grooming with modified independence;standing  Pt Will Perform Lower Body Dressing with modified independence;with adaptive equipment;sit to/from stand  Pt Will Transfer to Toilet with modified independence;bedside commode;ambulating  Pt Will Perform Toileting - Clothing Manipulation and hygiene with modified independence;sit to/from stand;sitting/lateral leans  Additional ADL Goal #1 Pt will perform bed mobility with supervision in preparation for ADLs  OT Time Calculation  OT Start Time (ACUTE ONLY) 0926  OT Stop Time (ACUTE ONLY) 0958  OT Time Calculation (min) 32 min  OT General Charges  $OT Visit 1 Visit  OT Treatments  $Self Care/Home Management  23-37 mins  Luisa Dago, OT/L   Acute OT Clinical Specialist Acute Rehabilitation Services Pager [phone] Office [phone]

## 2019-02-26 NOTE — Progress Notes (Addendum)
  Progress Note    02/26/2019 7:49 AM 11 Days Post-Op  Subjective:  No bowel movement yesterday or overnight.  Tolerated heart healthy diet without N/V.   Vitals:   02/25/19 1952 02/26/19 0300  BP: 140/82 122/69  Pulse: (!) 59 61  Resp: 19 18  Temp: 97.6 F (36.4 C) 97.8 F (36.6 C)  SpO2: 98% 98%   Physical Exam: Lungs:  Non labored Incisions:  abd and groin incisions healing well Extremities:  Feet symmetrically warm to touch; palpable ATA pulses Abdomen:  Soft, NT, ND Neurologic: baseline  CBC    Component Value Date/Time   WBC 9.3 02/26/2019 0312   RBC 3.16 (L) 02/26/2019 0312   HGB 9.4 (L) 02/26/2019 0312   HGB 15.7 09/27/2017 0930   HCT 30.6 (L) 02/26/2019 0312   HCT 46.8 09/27/2017 0930   PLT 245 02/26/2019 0312   PLT 275 09/27/2017 0930   MCV 96.8 02/26/2019 0312   MCV 94 09/27/2017 0930   MCH 29.7 02/26/2019 0312   MCHC 30.7 02/26/2019 0312   RDW 17.3 (H) 02/26/2019 0312   RDW 14.8 09/27/2017 0930   LYMPHSABS 1.3 02/25/2019 0438   MONOABS 0.7 02/25/2019 0438   EOSABS 0.5 02/25/2019 0438   BASOSABS 0.1 02/25/2019 0438    BMET    Component Value Date/Time   NA 134 (L) 02/26/2019 0312   NA 140 09/27/2017 0930   K 4.0 02/26/2019 0312   CL 105 02/26/2019 0312   CO2 24 02/26/2019 0312   GLUCOSE 94 02/26/2019 0312   BUN 18 02/26/2019 0312   BUN 14 09/27/2017 0930   CREATININE 1.31 (H) 02/26/2019 0312   CALCIUM 8.2 (L) 02/26/2019 0312   GFRNONAA 55 (L) 02/26/2019 0312   GFRAA >60 02/26/2019 0312    INR    Component Value Date/Time   INR 1.1 02/26/2019 0312     Intake/Output Summary (Last 24 hours) at 02/26/2019 0749 Last data filed at 02/26/2019 0359 Gross per 24 hour  Intake 2026.4 ml  Output 3550 ml  Net -1523.6 ml     Assessment/Plan:  68 y.o. male is s/p Aortobifemoral bypass with bilateral common femoral endarterectomy and profundoplasty, bilateral iliac thrombectomy 28 Days Post-Op   BLE well perfused Tolerated heart healthy  diet yesterday without N/V, ok to stop TPN; give MOM and colace this morning Continue heparin bridge; coumadin dosed by pharmacy SNF vs [name] when INR>2   Emilie Rutter, PA-C [facility] [phone] 02/26/2019 7:49 AM  Agree with above Ileus seems to have finally resolved  Possibly Rehab Thursday  [name], MD [facility] [place] Office: [phone] Pager: [phone]

## 2019-02-26 NOTE — Progress Notes (Signed)
OT Treatment Note  Kinesiotape used to facilitate extensionR hand/digits to increase functional positioning. Nsg/NT educated on use of tape. Feel pt would benefit form night rresting splint to increase positioning of digits in extension.  Will continue to follow acutely.    02/26/19 1400  OT Visit Information  Last OT Received On 02/26/19  Assistance Needed +1  History of Present Illness Pt s/p Aortobifemoral bypass on 2/10. Received central line 2/28. Pt developed afib with rvr, acute renal failure and ileus 2/12-2/13. Pt had NG tube for ileus that was removed 2/17 and had to be replaced 2/19. 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   Precautions  Precautions Fall  Pain Assessment  Pain Assessment Faces  Faces Pain Scale 2  Pain Location with R hand ROM  Pain Descriptors / Indicators Discomfort;Grimacing  Pain Intervention(s) Limited activity within patient's tolerance  Cognition  Arousal/Alertness Awake/alert  Behavior During Therapy WFL for tasks assessed/performed  Overall Cognitive Status Within Functional Limits for tasks assessed  Upper Extremity Assessment  RUE Deficits / Details RUE weakness; isolated movements out of synergy pattern; poor scapular glide limiting R shoulder function; R 3,4,5 flexion contratures of PIP joints @ 30-40 degrees. . Able to passively range PIPs into @ 20 degree extension lag; dominant UE prior to CVA; encouraged to use   General Comments  General comments (skin integrity, edema, etc.) R 3,4,5 digits assisted into extension. Kinesiotape used to facilitate extension of digits. Pt appears to help opsition digits into more of an extensor position instead of keeping fingers flexed in palm  OT - End of Session  Activity Tolerance Patient tolerated treatment well  Patient left in chair;with call bell/phone within reach;with chair alarm set  Nurse Communication Other (comment) (use of kinesiotape)  OT Assessment/Plan   OT Plan Discharge plan remains appropriate  OT Visit Diagnosis Unsteadiness on feet (R26.81);Other abnormalities of gait and mobility (R26.89);Muscle weakness (generalized) (M62.81)  Pain - Right/Left Right  Pain - part of body Arm  OT Frequency (ACUTE ONLY) Min 3X/week  Follow Up Recommendations [name];Supervision/Assistance - 24 hour  OT Equipment Other (comment)  AM-PAC OT "6 Clicks" Daily Activity Outcome Measure (Version 2)  Help from another person eating meals? 3  Help from another person taking care of personal grooming? 3  Help from another person toileting, which includes using toliet, bedpan, or urinal? 3  Help from another person bathing (including washing, rinsing, drying)? 3  Help from another person to put on and taking off regular upper body clothing? 3  Help from another person to put on and taking off regular lower body clothing? 3  6 Click Score 18  OT Goal Progression  Progress towards OT goals Progressing toward goals  Acute Rehab OT Goals  Patient Stated Goal To be independent  OT Goal Formulation With patient  Time For Goal Achievement 03/12/19  Potential to Achieve Goals Good  ADL Goals  Pt Will Perform Grooming with modified independence;standing  Pt Will Perform Lower Body Dressing with modified independence;with adaptive equipment;sit to/from stand  Pt Will Transfer to Toilet with modified independence;bedside commode;ambulating  Pt Will Perform Toileting - Clothing Manipulation and hygiene with modified independence;sit to/from stand;sitting/lateral leans  Additional ADL Goal #1 Pt will perform bed mobility with supervision in preparation for ADLs  OT Time Calculation  OT Start Time (ACUTE ONLY) 1052  OT Stop Time (ACUTE ONLY) 1108  OT Time Calculation (min) 16 min  OT General Charges  $OT Visit  1 Visit  OT Treatments  $Therapeutic Activity 8-22 mins  Luisa Dago, OT/L   Acute OT Clinical Specialist Acute Rehabilitation Services Pager  [phone] Office [phone]

## 2019-02-26 NOTE — Progress Notes (Signed)
Inpatient Rehabilitation Admissions Coordinator  I have received updated therapy assessments. I will begin insurance approval with [facility] Medicare for a  possible inpt rehab admit. We can take patient on Heparin bridge. I will follow up tomorrow.  Ottie Glazier, RN, MSN Rehab Admissions Coordinator [phone] 02/26/2019 10:24 AM

## 2019-02-26 NOTE — Progress Notes (Signed)
ANTICOAGULATION CONSULT NOTE - Follow Up Consult  Pharmacy Consult for Heparin  / Warfarin per MD Indication: atrial fibrillation  Labs: Recent Labs    02/24/19 0440 02/25/19 0431 02/25/19 0438 02/26/19 0312  HGB 9.4*  --  9.4* 9.4*  HCT 31.7*  --  31.2* 30.6*  PLT 291  --  276 245  LABPROT  --   --   --  14.1  INR  --   --   --  1.1  HEPARINUNFRC 0.42 0.19*  --  0.49  CREATININE 1.24  --  1.25* 1.31*    Assessment: 68yo male continues on heparin for Afib Heparin level therapeutic this AM  Warfarin resumed 3/9 at home dose of 1.5 mg po daily  Goal of Therapy:  Heparin level 0.3-0.7 units/ml  INR = 2 to 3   Plan:  Continue heparin at 1800 units / hr Follow up AM labs  Thank you Okey Regal, PharmD [phone]  02/26/2019,8:46 AM

## 2019-02-26 NOTE — Progress Notes (Signed)
Physical Therapy Treatment Patient Details Name: [name] [name] MRN: [id number] DOB: 1951/07/23 Today's Date: 02/26/2019    History of Present Illness Pt s/p Aortobifemoral bypass on 2/10. Received central line 2/28. Pt developed afib with rvr, acute renal failure and ileus 2/12-2/13. Pt had NG tube for ileus that was removed 2/17 and had to be replaced 2/19. 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     PT Comments    Pt continues to make excellent progress and motivated to work toward return to independence. Sister can provide support at home. Feel at this point pt can tolerate [name] and progress to a level that sister can assist with at home. Recommend [name].    Follow Up Recommendations  [name]     Equipment Recommendations  Other (comment)(TBD)    Recommendations for Other Services       Precautions / Restrictions Precautions Precautions: Fall Restrictions Weight Bearing Restrictions: No    Mobility  Bed Mobility Overal bed mobility: Needs Assistance Bed Mobility: Supine to Sit     Supine to sit: Min assist     General bed mobility comments: Assist to elevate trunk into sitting  Transfers Overall transfer level: Needs assistance Equipment used: Rolling walker (2 wheeled) Transfers: Sit to/from Stand Sit to Stand: Min assist         General transfer comment: Assist to bring hips up and for balance. Assist to get rt hand on walker  Ambulation/Gait Ambulation/Gait assistance: Min assist Gait Distance (Feet): 100 Feet Assistive device: Rolling walker (2 wheeled) Gait Pattern/deviations: Shuffle;Decreased stride length;Narrow base of support;Step-through pattern;Decreased dorsiflexion - right;Trunk flexed Gait velocity: decreased Gait velocity interpretation: <1.31 ft/sec, indicative of household ambulator General Gait Details: Assist for balance. Pt with rt lateral lean. Verbal cues to stand more erect and widen  gait.   Stairs             Wheelchair Mobility    Modified Rankin (Stroke Patients Only)       Balance Overall balance assessment: Needs assistance Sitting-balance support: No upper extremity supported;Feet supported Sitting balance-Leahy Scale: Fair     Standing balance support: During functional activity;Bilateral upper extremity supported Standing balance-Leahy Scale: Poor Standing balance comment: walker and min guard for static standing                            Cognition Arousal/Alertness: Awake/alert Behavior During Therapy: WFL for tasks assessed/performed Overall Cognitive Status: Within Functional Limits for tasks assessed                                        Exercises      General Comments General comments (skin integrity, edema, etc.): vss      Pertinent Vitals/Pain Pain Assessment: No/denies pain    Home Living                      Prior Function            PT Goals (current goals can now be found in the care plan section) Progress towards PT goals: Progressing toward goals    Frequency    Min 3X/week      PT Plan Discharge plan needs to be updated    Co-evaluation  AM-PAC PT "6 Clicks" Mobility   Outcome Measure  Help needed turning from your back to your side while in a flat bed without using bedrails?: None Help needed moving from lying on your back to sitting on the side of a flat bed without using bedrails?: A Little Help needed moving to and from a bed to a chair (including a wheelchair)?: A Little Help needed standing up from a chair using your arms (e.g., wheelchair or bedside chair)?: A Little Help needed to walk in hospital room?: A Little Help needed climbing 3-5 steps with a railing? : Total 6 Click Score: 17    End of Session Equipment Utilized During Treatment: Gait belt Activity Tolerance: Patient tolerated treatment well Patient left: in chair;with  call bell/phone within reach;with chair alarm set Nurse Communication: Mobility status PT Visit Diagnosis: Other abnormalities of gait and mobility (R26.89);Muscle weakness (generalized) (M62.81)     Time: 2244-9753 PT Time Calculation (min) (ACUTE ONLY): 22 min  Charges:  $Gait Training: 8-22 mins                     [facility] PT Acute Rehabilitation Services Pager [phone] Office [phone]    Angelina Ok [facility] 02/26/2019, 9:36 AM

## 2019-02-27 LAB — GLUCOSE, CAPILLARY
Glucose-Capillary: 79 mg/dL (ref 70–99)
Glucose-Capillary: 76 mg/dL (ref 70–99)
Glucose-Capillary: 149 mg/dL — ABNORMAL HIGH (ref 70–99)
Glucose-Capillary: 134 mg/dL — ABNORMAL HIGH (ref 70–99)
Glucose-Capillary: 95 mg/dL (ref 70–99)
Glucose-Capillary: 85 mg/dL (ref 70–99)

## 2019-02-27 LAB — HEPARIN LEVEL (UNFRACTIONATED): Heparin Unfractionated: 0.68 IU/mL (ref 0.30–0.70)

## 2019-02-27 LAB — CBC
HCT: 32.1 % — ABNORMAL LOW (ref 39.0–52.0)
Hemoglobin: 9.9 g/dL — ABNORMAL LOW (ref 13.0–17.0)
MCH: 29.8 pg (ref 26.0–34.0)
MCHC: 30.8 g/dL (ref 30.0–36.0)
MCV: 96.7 fL (ref 80.0–100.0)
NRBC: 0 % (ref 0.0–0.2)
Platelets: 240 10*3/uL (ref 150–400)
RBC: 3.32 MIL/uL — ABNORMAL LOW (ref 4.22–5.81)
RDW: 17.2 % — ABNORMAL HIGH (ref 11.5–15.5)
WBC: 9.4 10*3/uL (ref 4.0–10.5)

## 2019-02-27 LAB — PROTIME-INR
INR: 1.2 (ref 0.8–1.2)
Prothrombin Time: 14.6 seconds (ref 11.4–15.2)

## 2019-02-27 MED ORDER — ENSURE ENLIVE PO LIQD
237.0000 mL | Freq: Two times a day (BID) | ORAL | Status: DC
  Administered 2019-02-28 (×2): 237 mL via ORAL

## 2019-02-27 MED ORDER — FLEET ENEMA 7-19 GM/118ML RE ENEM: 1.0000 | ENEMA | Freq: Once | RECTAL | Status: DC

## 2019-02-27 NOTE — Progress Notes (Addendum)
Inpatient Rehabilitation Admissions Coordinator  I have received a denial for an inpt rehab admission with [facility]. I contacted Dr. Oneida Alar and he is aware. I met with patient at bedside and he is aware and have left his sister, Elnita Maxwell, a voicemail to contact me so I can make her aware. Dreama stated to me yesterday that if [name] not approved, she would prefer d/c home with [facility] and she will provide 24/7 supervision. I have contacted RN CM, Kristi, and she is aware. We will sign off at this time.  Danne Baxter, RN, MSN Rehab Admissions Coordinator [phone] 02/27/2019 9:12 AM   Pt's sister, Elnita Maxwell, returned my call and is aware of insurance denial for an inpt rehab admit. She prefers home with [facility] and is aware d/c likely not before tomorrow due to the heparin bridge.

## 2019-02-27 NOTE — Progress Notes (Addendum)
Nutrition Follow Up  DOCUMENTATION CODES:   Non-severe (moderate) malnutrition in context of acute illness/injury  INTERVENTION:    Ensure Enlive po BID, each supplement provides 350 kcal and 20 grams of protein  NUTRITION DIAGNOSIS:   Moderate Malnutrition related to acute illness (ileus) as evidenced by energy intake < or equal to 50% for > or equal to 5 days, mild muscle depletion, mild fat depletion, ongoing  GOAL:   Patient will meet greater than or equal to 90% of their needs, progressing  MONITOR:   PO intake, Supplement acceptance, Labs, Skin, Weight trends, I & O's  ASSESSMENT:   68 year old male who presented on 2/10 for aortobifemoral bypass with bilateral common femoral endarterectomy and profundoplasty, bilateral iliac thrombectomy. PMH significant for atrial fibrillation, tachybrady syndrome, prior CVA, CKD, and PVD.  2/12 clear liquids 2/13 transferred to ICU, NPO, NGT placed d/t post-op ileus  2/18 pharmacy consulted for TPN, not started 2/18 NGT removed, advanced to clear liquids 2/20 TPN started 2/26 TPN discontinued d/t line infection 2/28 s/p new IJ catheter insertion  3/04 NGT discontinued 3/06 advanced to clear liquids 3/08 TPN discontinued  Pt currently on a Heart Healthy diet. PO intake excellent at 100% per flowsheet records. Dr. Darrick Penna note reviewed; pt passing gas, however, no BM today.  Pt would benefit from nutrition supplements given malnutrition. Medications include colace, MOM, dulcolax and zofran. Labs reviewed. CBG's [phone].  Diet Order:   Diet Order            Diet Heart Room service appropriate? Yes; Fluid consistency: Thin  Diet effective now             EDUCATION NEEDS:   No education needs have been identified at this time  Skin:  Skin Assessment: Skin Integrity Issues: Incisions: closed incisions to R groin, L groin, abdomen  Last BM:  3/5  Height:   Ht Readings from Last 1 Encounters:  01/28/19 6\' 5"   (1.956 m)   Weight:   Wt Readings from Last 1 Encounters:  02/22/19 80.4 kg   Ideal Body Weight:  94.5 kg  BMI:  Body mass index is 21.02 kg/m.  Estimated Nutritional Needs:   Kcal:  2200-2400  Protein:  110-125 gm  Fluid:  >/= 2.2 L  Maureen Chatters, RD, LDN Pager #: [phone] After-Hours Pager #: [phone]

## 2019-02-27 NOTE — Progress Notes (Signed)
ANTICOAGULATION CONSULT NOTE - Follow Up Consult  Pharmacy Consult for Heparin  / Warfarin per MD Indication: atrial fibrillation  Labs: Recent Labs    02/25/19 0431  02/25/19 0438 02/26/19 0312 02/27/19 0305 02/27/19 0306  HGB  --    < > 9.4* 9.4*  --  9.9*  HCT  --   --  31.2* 30.6*  --  32.1*  PLT  --   --  276 245  --  240  LABPROT  --   --   --  14.1  --  14.6  INR  --   --   --  1.1  --  1.2  HEPARINUNFRC 0.19*  --   --  0.49 0.68  --   CREATININE  --   --  1.25* 1.31*  --   --    < > = values in this interval not displayed.    Assessment: 68yo male continues on heparin for Afib Heparin level therapeutic this AM  Warfarin resumed 3/9 at home dose of 1.5 mg po daily (consider increase?)  Goal of Therapy:  Heparin level 0.3-0.7 units/ml  INR = 2 to 3   Plan:  Continue heparin at 1800 units / hr Follow up AM labs  Resume Warfarin 1.5 mg po daily  Thank you Okey Regal, PharmD [phone]  02/27/2019,8:46 AM

## 2019-02-27 NOTE — Progress Notes (Addendum)
  Progress Note    02/27/2019 7:58 AM 12 Days Post-Op  Subjective:  No complaints this morning.  Tolerating breakfast this morning.  Flatus. No BM today.   Vitals:   02/26/19 1609 02/26/19 2217  BP: 114/82 123/77  Pulse: 61 60  Resp: 18 (!) 21  Temp: 98.4 F (36.9 C) 98.4 F (36.9 C)  SpO2: 98% 99%   Physical Exam: Lungs:  Non labored Incisions:  abd and groin incisions healed Extremities:  Palpable ATA pulses Abdomen:  Soft, NT, ND Neurologic: baseline  CBC    Component Value Date/Time   WBC 9.4 02/27/2019 0306   RBC 3.32 (L) 02/27/2019 0306   HGB 9.9 (L) 02/27/2019 0306   HGB 15.7 09/27/2017 0930   HCT 32.1 (L) 02/27/2019 0306   HCT 46.8 09/27/2017 0930   PLT 240 02/27/2019 0306   PLT 275 09/27/2017 0930   MCV 96.7 02/27/2019 0306   MCV 94 09/27/2017 0930   MCH 29.8 02/27/2019 0306   MCHC 30.8 02/27/2019 0306   RDW 17.2 (H) 02/27/2019 0306   RDW 14.8 09/27/2017 0930   LYMPHSABS 1.3 02/25/2019 0438   MONOABS 0.7 02/25/2019 0438   EOSABS 0.5 02/25/2019 0438   BASOSABS 0.1 02/25/2019 0438    BMET    Component Value Date/Time   NA 134 (L) 02/26/2019 0312   NA 140 09/27/2017 0930   K 4.0 02/26/2019 0312   CL 105 02/26/2019 0312   CO2 24 02/26/2019 0312   GLUCOSE 94 02/26/2019 0312   BUN 18 02/26/2019 0312   BUN 14 09/27/2017 0930   CREATININE 1.31 (H) 02/26/2019 0312   CALCIUM 8.2 (L) 02/26/2019 0312   GFRNONAA 55 (L) 02/26/2019 0312   GFRAA >60 02/26/2019 0312    INR    Component Value Date/Time   INR 1.2 02/27/2019 0306     Intake/Output Summary (Last 24 hours) at 02/27/2019 0758 Last data filed at 02/27/2019 0505 Gross per 24 hour  Intake 810 ml  Output 3950 ml  Net -3140 ml     Assessment/Plan:  68 y.o. male is s/p Aortobifemoral bypass with bilateral common femoral endarterectomy and profundoplasty, bilateral iliac thrombectomy 29 Days Post-Op   Perfusing BLE well with palpable ATA pulses Tolerating full diet; one time enema this  morning Continue heparin bridge D/c to [name] when bed available   Emilie Rutter, PA-C [facility] [phone] 02/27/2019 7:58 AM  Agree with above Ok for Rehab at this point Continue to titrate warfarin for INR 2 Continue heparin until INR 2  [name], MD [facility] [place] Office: [phone] Pager: [phone]  Addendum:  Rehab stay denied.  Will look into lovenox bridge with d/c home tomorrow and home health as well as PT OT  [name], MD [facility] [place] Office: [phone] Pager: [phone]

## 2019-02-28 LAB — GLUCOSE, CAPILLARY
Glucose-Capillary: 75 mg/dL (ref 70–99)
Glucose-Capillary: 83 mg/dL (ref 70–99)
Glucose-Capillary: 86 mg/dL (ref 70–99)
Glucose-Capillary: 95 mg/dL (ref 70–99)

## 2019-02-28 LAB — CBC
HCT: 35.2 % — ABNORMAL LOW (ref 39.0–52.0)
Hemoglobin: 10.9 g/dL — ABNORMAL LOW (ref 13.0–17.0)
MCH: 29.9 pg (ref 26.0–34.0)
MCHC: 31 g/dL (ref 30.0–36.0)
MCV: 96.7 fL (ref 80.0–100.0)
Platelets: 250 10*3/uL (ref 150–400)
RBC: 3.64 MIL/uL — ABNORMAL LOW (ref 4.22–5.81)
RDW: 17.3 % — ABNORMAL HIGH (ref 11.5–15.5)
WBC: 9.1 10*3/uL (ref 4.0–10.5)
nRBC: 0 % (ref 0.0–0.2)

## 2019-02-28 LAB — PROTIME-INR
INR: 1.2 (ref 0.8–1.2)
Prothrombin Time: 14.7 seconds (ref 11.4–15.2)

## 2019-02-28 LAB — HEPARIN LEVEL (UNFRACTIONATED): Heparin Unfractionated: 0.64 IU/mL (ref 0.30–0.70)

## 2019-02-28 MED ORDER — WARFARIN SODIUM 1 MG PO TABS: 2.0000 mg | ORAL_TABLET | Freq: Every day | ORAL | 0 refills | Status: DC

## 2019-02-28 MED ORDER — ENOXAPARIN SODIUM 80 MG/0.8ML ~~LOC~~ SOLN
80.0000 mg | Freq: Once | SUBCUTANEOUS | Status: AC
  Administered 2019-02-28: 80 mg via SUBCUTANEOUS
  Filled 2019-02-28: qty 0.8

## 2019-02-28 MED ORDER — ENOXAPARIN SODIUM 80 MG/0.8ML ~~LOC~~ SOLN: 80.0000 mg | Freq: Two times a day (BID) | SUBCUTANEOUS | 0 refills | Status: DC

## 2019-02-28 NOTE — Discharge Instructions (Signed)
 [facility]  Discharge Instructions   Open Aortic Surgery  Please refer to the following instructions for your post-procedure care. Your surgeon or Physician Assistant will discuss any changes with you.  Activity  Avoid lifting more than eight pounds (a gallon of milk) until after your first post-operative visit. You are encouraged to walk as much as you can. You can slowly return to normal activities but must avoid strenuous activity and heavy lifting until your doctor tells you it's OK. Heavy lifting can hurt the incision and cause a hernia. Avoid activities such as vacuuming or swinging a golf club. It is normal to feel tired for several weeks after your surgery. Do not drive until your doctor gives the OK and you are no longer taking prescription pain medications. It is also normal to have difficulty with sleep habits, eating and bowl movements after surgery. These will go away with time.  Bathing/Showering  You may shower after you go home. Do not soak in a bathtub, hot tub, or swim until the incision heals.  Incision Care  Shower every day. Clean your incision with mild soap and water. Pat the area dry with a clean towel. You do not need a bandage unless otherwise instructed. Do not apply any ointments or creams to your incision. You may have skin glue on your incision. Do not peel it off. It will come off on its own in about one week. If you have staples or sutures along your incision, they will be removed at your post op appointment.  Diet  Resume your normal diet. There are no special food restriction following this procedure. A low fat/low cholesterol diet is recommended for all patients with vascular disease. After your aortic surgery, it's normal to feel full faster than usual and to not feel as hungry as you normally would. You will probably lose weight initially following your surgery. It's best to eat small, frequent meals over the course of  the day. Call the office if you find that you are unable to eat even small meals. In order to heal from your surgery, it is CRITICAL to get adequate nutrition. Your body requires vitamins, minerals, and protein. Vegetables are the best source of vitamins and minerals. causing pain, you may take over-the-counter pain reliever such as acetaminophen (Tylenol). If you were prescribed a stronger pain medication, please be aware these medication can cause nausea and constipation. Prevent nausea by taking the medication with a snack or meal. Avoid constipation by drinking plenty of fluids and eating foods with a high amount of fiber, such as fruits, vegetables and grains. Take 100mg of the over-the-counter stool softener Colace twice a day as needed to help with constipation. A laxative, such as Milk of Magnesia, may be recommended for you at this time. Do not take a laxative unless your surgeon or P.A. tells you it's OK. Do not take Tylenol if you are taking stronger pain medications (such as Percocet).  Follow Up  Our office will schedule a follow up appointment 2-3 weeks after discharge.  Please call us immediately for any of the following conditions    .     Severe or worsening pain in your legs or feet or in your abdomen back or chest. Increased pain, redness drainage (pus) from your incision site. Increased abdominal pain, bloating, nausea, vomiting, or persistent diarrhea. Fever of 101 degrees or higher. Swelling in your leg (s).  Reduce your risk of vascular disease  Stop smoking.   If you would like help, call [facility] at 1-800-QUIT-NOW (1-800-784-8669) or [place] at 336-586-4000. Manage your cholesterol Maintain a desired weight Control your diabetes Keep your blood pressure down  If you have any questions please call the office at [phone].   

## 2019-02-28 NOTE — Progress Notes (Signed)
Patient d/ced to home.  Cousin here to take patient home.  IV d/ced with catheter intact.  Cousin educated on the administration of Lovenox injection.  All paperwork given to patient.  Patient states that he has a walker at home that he will be using.  HH to be visiting patient for care. All belongings sent with patient.  Patient stated that he had no questions prior to being d/ced in regards to hisa care at home.

## 2019-02-28 NOTE — Progress Notes (Signed)
[facility]  Subjective  - feels ok   Objective 122/78 73 98.3 F (36.8 C) (Oral) 20 96%  Intake/Output Summary (Last 24 hours) at 02/28/2019 1027 Last data filed at 02/28/2019 0502 Gross per 24 hour  Intake 1609.4 ml  Output 1975 ml  Net -365.6 ml   Tolerating diet  Abdomen soft  Assessment/Planning: afib rate control.  Needs INR > 2 can d/c home if can arrange lovenox bridge otherwise stay until INR therapeutic  aortobifem stable at this point for d/c  Ileus resolved  [name] 02/28/2019 10:27 AM --  Laboratory Lab Results: Recent Labs    02/27/19 0306 02/28/19 0433  WBC 9.4 9.1  HGB 9.9* 10.9*  HCT 32.1* 35.2*  PLT 240 250   BMET Recent Labs    02/26/19 0312  NA 134*  K 4.0  CL 105  CO2 24  GLUCOSE 94  BUN 18  CREATININE 1.31*  CALCIUM 8.2*    COAG Lab Results  Component Value Date   INR 1.2 02/28/2019   INR 1.2 02/27/2019   INR 1.1 02/26/2019   No results found for: PTT

## 2019-02-28 NOTE — Progress Notes (Signed)
Occupational Therapy Treatment Patient Details Name: [name] [name] MRN: [id number] DOB: Apr 09, 1951 Today's Date: 02/28/2019    History of present illness Pt s/p Aortobifemoral bypass on 2/10. Received central line 2/28. Pt developed afib with rvr, acute renal failure and ileus 2/12-2/13. Pt had NG tube for ileus that was removed 2/17 and had to be replaced 2/19. 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    OT comments  Pt eager to discharge home today with sister who can provide 24 hour care. Friend who is also his POA observed OT mobilizing and patient's participating in ADL. Recommending HHOT.  Follow Up Recommendations  Home health OT;Supervision/Assistance - 24 hour    Equipment Recommendations  None recommended by OT    Recommendations for Other Services      Precautions / Restrictions Precautions Precautions: Fall       Mobility Bed Mobility Overal bed mobility: Modified Independent Bed Mobility: Supine to Sit           General bed mobility comments: HOB flat with no rail to mimic home set up  Transfers Overall transfer level: Needs assistance Equipment used: Rolling walker (2 wheeled) Transfers: Sit to/from Stand Sit to Stand: Min assist;Min guard         General transfer comment: steadying assist from bed with first stand, min guard for all subsequent stands, cues for hand placement    Balance Overall balance assessment: Needs assistance Sitting-balance support: No upper extremity supported;Feet supported Sitting balance-Leahy Scale: Good Sitting balance - Comments: no LOB with attempt to don socks     Standing balance-Leahy Scale: Poor Standing balance comment: requires one hand support on walker or grab bar                            ADL either performed or assessed with clinical judgement   ADL Overall ADL's : Needs assistance/impaired Eating/Feeding: Set up;Sitting   Grooming: Brushing  hair;Sitting;Set up Grooming Details (indicate cue type and reason): at sink         Upper Body Dressing : Minimal assistance;Sitting Upper Body Dressing Details (indicate cue type and reason): front opening gown Lower Body Dressing: Moderate assistance;Sitting/lateral leans Lower Body Dressing Details (indicate cue type and reason): can doff socks and pull up once started over his toes Toilet Transfer: Min guard;Ambulation;RW;BSC           Functional mobility during ADLs: Min guard;Rolling walker General ADL Comments: friend and POA observing session     Vision       Perception     Praxis      Cognition Arousal/Alertness: Awake/alert Behavior During Therapy: WFL for tasks assessed/performed Overall Cognitive Status: Within Functional Limits for tasks assessed                                 General Comments: communicating with head nod and gestures        Exercises     Shoulder Instructions       General Comments      Pertinent Vitals/ Pain       Pain Assessment: No/denies pain  Home Living  Prior Functioning/Environment              Frequency  Min 3X/week        Progress Toward Goals  OT Goals(current goals can now be found in the care plan section)  Progress towards OT goals: Progressing toward goals  Acute Rehab OT Goals Patient Stated Goal: to go home today OT Goal Formulation: With patient Time For Goal Achievement: 03/12/19 Potential to Achieve Goals: Good  Plan Discharge plan needs to be updated    Co-evaluation                 AM-PAC OT "6 Clicks" Daily Activity     Outcome Measure   Help from another person eating meals?: A Little Help from another person taking care of personal grooming?: A Little Help from another person toileting, which includes using toliet, bedpan, or urinal?: A Little Help from another person bathing (including washing,  rinsing, drying)?: A Little Help from another person to put on and taking off regular upper body clothing?: A Little Help from another person to put on and taking off regular lower body clothing?: A Lot 6 Click Score: 17    End of Session Equipment Utilized During Treatment: Gait belt;Rolling walker  OT Visit Diagnosis: Unsteadiness on feet (R26.81);Other abnormalities of gait and mobility (R26.89);Muscle weakness (generalized) (M62.81)   Activity Tolerance Patient tolerated treatment well   Patient Left in chair;with call bell/phone within reach   Nurse Communication Other (comment)(ready to discharge home)        Time: 1415-1450 OT Time Calculation (min): 35 min  Charges: OT General Charges $OT Visit: 1 Visit OT Treatments $Self Care/Home Management : 23-37 mins  Martie Round, OTR/L Acute Rehabilitation Services Pager: [phone] Office: [phone]   Evern Bio 02/28/2019, 3:36 PM

## 2019-02-28 NOTE — Care Management Note (Signed)
Case Management Note Original Note Created Colleen Can RN, BSN, NCM-BC, ACM-RN [phone] 02/04/2019, 3:37 PM   Patient Details  Name: BOWIE SCELSI MRN: [id number] Date of Birth: 1951/05/21  Subjective/Objective:  68 yo male presented for s/p Aortobifemoral bypass on 2/10. 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                Action/Plan: Patient lived at home alone PTA with PT recommending SNF for rehab. The CSW following to facilitate upon medical stability. CM team will continue to follow.   Additional CM follow up notes:  Post op coarse coarse complicated by afib with rvr, acute renal failure and ileus 2/12-2/13. Pt had NG tube for ileus that was removed 2/17 and had to be replaced 2/19, was on TPN that has been weaned off and pt now tolerating diet.   Expected Discharge Date:    02/28/19              Expected Discharge Plan:  Skilled Nursing Facility- pt  going home with [facility] after being denied by [name]  In-House Referral:  Clinical Social Work  Discharge planning Services  CM Consult  Post Acute Care Choice:  Home Health, Durable Medical Equipment Choice offered to:  Patient  DME Arranged:  Walker rolling DME Agency:  [facility]  HH Arranged:  RN, PT, OT, Nurse's Aide HH Agency:  [facility]  Status of Service:  Completed, signed off  If discussed at Long Length of Stay Meetings, dates discussed:    Discharge Disposition: home/home health   Additional Comments:   02/28/19- 1030- Sigourney Portillo RN, CM- pt for transition home today, pt was denied for [name] by insurance and pt's sister does not want to look at [facility] and desires to take pt home with [facility] at this time. Pt is agreeable to this plan. Spoke with pt at bedside to discuss pt's pre-op referral that was done to [facility] and provide list Per CMS guidelines from medicare.gov website with star ratings (copy placed in shadow chart) for choice- per pt he would like to  stay with [facility] for [facility] services/needs- orders have been placed for HHRN/PT/OT/aide- confirmed with pt that he is going to his address and sister coming there to stay. Address, phone # and PCP all confirmed in epic. Pt also states he needs a RW for home- order has been placed for this as well, call made to [facility] with [facility] for DME need- RW to be delivered to room prior to discharge. Call also made to Tiffany with [facility] to alert of discharge and HH orders for start of care. Pt to go home with Lovenox- benefits check submitted for coverage benefits check.   Donn Pierini  [phone] 4E Transitions of Care RN Case Manager 02/28/2019, 10:30 AM

## 2019-02-28 NOTE — Discharge Summary (Signed)
Physician Discharge Summary   Patient ID: [name] [name] [id number] 68 y.o. Jan 20, 1951  Admit date: 01/28/2019  Discharge date and time: 02/28/19   Admitting Physician: Sherren Kerns, MD   Discharge Physician: same  Admission Diagnoses: PERIPHERAL ARTERY DISEASE  Discharge Diagnoses: same Atrial fibrillation Post operative ileus- resolved ARF- resolved ABL anemia -resolved Bacteremia- resolved UTI- resolved   Admission Condition: poor  Discharged Condition: fair  Indication for Admission: Rest pain bilateral feet  Hospital Course: [name] is a 68 year old male who is brought in as an outpatient for Aortobifemoral bypass with bilateral common femoral endarterectomy and profundoplasty with bilateral iliac thrombectomy by Dr. Darrick Penna on 01/28/2019.  This involved a supra renal clamp.  Patient had PT Doppler signals bilaterally at the conclusion of the case.  He was admitted to the ICU postoperatively per protocol.  He progressed well in the ICU and was transferred to the stepdown unit on postoperative day #1.  Postoperative day #3 he had some difficulty with known diagnosis of atrial fibrillation and experienced periods of hypotension.  Cardiology was consulted at this point and patient was started on IV heparin as well as IV diltiazem.  IV diltiazem was eventually transitioned to IV amiodarone.  Patient was also noted to experience acute renal failure around postoperative day #3 and nephrology was consulted for assistance.  It was also around this time that he was noted by KUB to have likely a postoperative ileus and NG tube was placed.  Patient also experienced acute blood loss anemia related to surgery and was transfused 2 units around postoperative day 5.  He was started on TPN around postoperative day #7 due to a persistent ileus.  To further work-up ileus he underwent a CT scan which did demonstrate a left-sided retroperitoneal hematoma likely causing patient's  ileus.  CT was repeated approximately weekly until ileus resolved about 1 week prior to discharge.  Also due to persistent ileus fluid status was difficult to assess and renal function seemed to improve and worsen in a cyclical pattern until ileus resolved.  Around postoperative day #10 due to upward trend of WBC and leukocytosis blood was cultured and he was started on empiric antibiotics.  Blood culture demonstrated Enterobacter and urine culture demonstrated gram-negative rods and patient was continued on IV Zosyn until these issues resolved.  Also around this time the PICC line was removed.  On 02/15/2019 Dr. Darrick Penna placed a right internal jugular central line for continued TPN.  Around 02/18/2019 patient experienced bradycardia into the 30s and cardiology was reconsulted.  Amiodarone IV drip was discontinued.  It was not until 3/6 the patient had a bowel movement and clears were started.  Diet was slowly progressed over the next several days until he tolerated a regular diet and home medicines were restarted.  IV heparin was continued to bridge until INR is therapeutic.  Inpatient rehabilitation also consulted and accepted however insurance denied [name] admission.  Case manager was then involved with this patient and it was eventually determined that he will be discharged to home with 24-hour supervision by the patient's sister.  He will also be arranged home health PT. at the time of discharge INR was still subtherapeutic so patient was prescribed Lovenox injections and will follow up with cardiology in 1 week for INR check.  He will also follow-up with Dr. fields in about 2 weeks.  Throughout patient's hospital stay incisions are unremarkable and eventually healed with staples being removed.  He also maintained well perfused and  warm feet.  Discharge instructions were reviewed with the patient and he voices understanding.  He will be discharged to home with home health in stable condition this  afternoon.  Consults: cardiology and nephrology  Treatments: surgery: Aortobifemoral bypass with bilateral common femoral endarterectomy and profundoplasty with bilateral iliac thrombectomy by Dr. Darrick Penna on 01/28/2019  Placement of right IJ triple-lumen central line by Dr. Darrick Penna on 02/15/2019  Discharge Exam: See progress note 02/28/2019 Vitals:   02/28/19 0538 02/28/19 1325  BP: 122/78 114/74  Pulse: 73 60  Resp: 20 17  Temp: 98.3 F (36.8 C) 98 F (36.7 C)  SpO2: 96% 96%     Disposition: Discharge disposition: 01-Home or Self Care       Patient Instructions:  Allergies as of 02/28/2019   No Known Allergies     Medication List    TAKE these medications   aspirin 325 MG tablet Take 325 mg by mouth daily.   atorvastatin 20 MG tablet Commonly known as:  LIPITOR Take 1 tablet (20 mg total) by mouth daily.   enoxaparin 80 MG/0.8ML injection Commonly known as:  LOVENOX Inject 0.8 mLs (80 mg total) into the skin 2 (two) times daily for 10 days.   metoprolol tartrate 25 MG tablet Commonly known as:  LOPRESSOR Take 25 mg by mouth daily.   warfarin 1 MG tablet Commonly known as:  COUMADIN Take as directed. If you are unsure how to take this medication, talk to your nurse or doctor. Original instructions:  Take 2 tablets (2 mg total) by mouth daily at 6 PM for 1 dose. What changed:    how much to take  when to take this            Durable Medical Equipment  (From admission, onward)         Start     Ordered   02/28/19 1026  For home use only DME Walker rolling  Once    Question:  Patient needs a walker to treat with the following condition  Answer:  Aortoiliac occlusive disease (HCC)   02/28/19 1027         Activity: activity as tolerated Diet: regular diet Wound Care: none needed  Follow-up with Dr. Darrick Penna in 2 weeks. Follow up with Cardiology in 1 week for INR check.  SignedEmilie Rutter 02/28/2019 2:48 PM

## 2019-02-28 NOTE — Progress Notes (Addendum)
ANTICOAGULATION CONSULT NOTE - Follow Up Consult  Pharmacy Consult for Heparin  / Warfarin per MD Indication: atrial fibrillation  Labs: Recent Labs    02/26/19 0312 02/27/19 0305 02/27/19 0306 02/28/19 0433  HGB 9.4*  --  9.9* 10.9*  HCT 30.6*  --  32.1* 35.2*  PLT 245  --  240 250  LABPROT 14.1  --  14.6 14.7  INR 1.1  --  1.2 1.2  HEPARINUNFRC 0.49 0.68  --  0.64  CREATININE 1.31*  --   --   --     Assessment: 68yo male continues on heparin for Afib. Chronic Coumadin 1.5mg  daily for afib, bridged with Lovenox PTA. S/p s/p Aortobifemoral bypass with bilateral common femoral endarterectomy and profundoplasty, bilateral iliac thrombectomy. Last heparin level therapeutic at 0.64. INR remains low at 1.2. Hgb 10.9, plts wnl.  Goal of Therapy:  Heparin level 0.3-0.7 units/ml  INR = 2 to 3   Plan:  Stop heparin gtt and start enoxaparin 80mg  [place] Q12h Continue warfarin 1.5 mg po daily - home dose Monitor daily heparin level / INR, CBC, s/s of bleed  Probable discharge today  Enzo Bi, PharmD, BCPS, [facility] Clinical Pharmacist Phone number [phone] 02/28/2019 10:25 AM

## 2019-03-01 ENCOUNTER — Telehealth: Payer: Self-pay | Admitting: Vascular Surgery

## 2019-03-01 ENCOUNTER — Telehealth: Payer: Self-pay | Admitting: Cardiology

## 2019-03-01 DIAGNOSIS — I480 Paroxysmal atrial fibrillation: Secondary | ICD-10-CM | POA: Diagnosis not present

## 2019-03-01 DIAGNOSIS — I69351 Hemiplegia and hemiparesis following cerebral infarction affecting right dominant side: Secondary | ICD-10-CM | POA: Diagnosis not present

## 2019-03-01 DIAGNOSIS — M6281 Muscle weakness (generalized): Secondary | ICD-10-CM | POA: Diagnosis not present

## 2019-03-01 DIAGNOSIS — Z7901 Long term (current) use of anticoagulants: Secondary | ICD-10-CM | POA: Diagnosis not present

## 2019-03-01 DIAGNOSIS — I70213 Atherosclerosis of native arteries of extremities with intermittent claudication, bilateral legs: Secondary | ICD-10-CM | POA: Diagnosis not present

## 2019-03-01 DIAGNOSIS — I69320 Aphasia following cerebral infarction: Secondary | ICD-10-CM | POA: Diagnosis not present

## 2019-03-01 DIAGNOSIS — Z48812 Encounter for surgical aftercare following surgery on the circulatory system: Secondary | ICD-10-CM | POA: Diagnosis not present

## 2019-03-01 NOTE — Telephone Encounter (Signed)
sch appt lvm mld ltr 03/14/2019 1045am p/o MD

## 2019-03-01 NOTE — Telephone Encounter (Signed)
Returned call to [facility] RN - we have not been following patient's warfarin and he is scheduled to start following at the [facility] office on Monday. She was clarifying discharge warfarin instructions - advised her that I can only see that a PA sent in 2 tablets of 1mg  of warfarin for him to take, but I am not aware of what his previous maintenance dose was PTA since we were not following him. He will continue on Lovenox bridge through the weekend and should be able to clarify PTA dosing regimen at upcoming appt on Monday.

## 2019-03-01 NOTE — Telephone Encounter (Signed)
Please give Marisue Ivan w/ [facility] a call concerning pt's coumadin [phone]

## 2019-03-01 NOTE — Telephone Encounter (Signed)
-----   Message from Emilie Rutter, PA-C sent at 02/28/2019 10:46 AM EDT -----  Can you schedule an appt for this pt with Dr. Darrick Penna ONLY in about 2 weeks.  PO Aortobifemoral bypass with complicated post operative course. Thanks, [employer]

## 2019-03-04 DIAGNOSIS — Z48812 Encounter for surgical aftercare following surgery on the circulatory system: Secondary | ICD-10-CM | POA: Diagnosis not present

## 2019-03-04 DIAGNOSIS — I70213 Atherosclerosis of native arteries of extremities with intermittent claudication, bilateral legs: Secondary | ICD-10-CM | POA: Diagnosis not present

## 2019-03-04 DIAGNOSIS — I69320 Aphasia following cerebral infarction: Secondary | ICD-10-CM | POA: Diagnosis not present

## 2019-03-04 DIAGNOSIS — M6281 Muscle weakness (generalized): Secondary | ICD-10-CM | POA: Diagnosis not present

## 2019-03-04 DIAGNOSIS — Z7901 Long term (current) use of anticoagulants: Secondary | ICD-10-CM | POA: Diagnosis not present

## 2019-03-04 DIAGNOSIS — I480 Paroxysmal atrial fibrillation: Secondary | ICD-10-CM | POA: Diagnosis not present

## 2019-03-04 DIAGNOSIS — I69351 Hemiplegia and hemiparesis following cerebral infarction affecting right dominant side: Secondary | ICD-10-CM | POA: Diagnosis not present

## 2019-03-05 ENCOUNTER — Telehealth: Payer: Self-pay

## 2019-03-05 ENCOUNTER — Telehealth: Payer: Self-pay | Admitting: Cardiology

## 2019-03-05 DIAGNOSIS — I69320 Aphasia following cerebral infarction: Secondary | ICD-10-CM | POA: Diagnosis not present

## 2019-03-05 DIAGNOSIS — I70213 Atherosclerosis of native arteries of extremities with intermittent claudication, bilateral legs: Secondary | ICD-10-CM | POA: Diagnosis not present

## 2019-03-05 DIAGNOSIS — Z48812 Encounter for surgical aftercare following surgery on the circulatory system: Secondary | ICD-10-CM | POA: Diagnosis not present

## 2019-03-05 DIAGNOSIS — M6281 Muscle weakness (generalized): Secondary | ICD-10-CM | POA: Diagnosis not present

## 2019-03-05 DIAGNOSIS — I69351 Hemiplegia and hemiparesis following cerebral infarction affecting right dominant side: Secondary | ICD-10-CM | POA: Diagnosis not present

## 2019-03-05 DIAGNOSIS — I480 Paroxysmal atrial fibrillation: Secondary | ICD-10-CM | POA: Diagnosis not present

## 2019-03-05 DIAGNOSIS — Z7901 Long term (current) use of anticoagulants: Secondary | ICD-10-CM | POA: Diagnosis not present

## 2019-03-05 NOTE — Telephone Encounter (Signed)
Spoke with [facility] RN.  They are due to see the patient in home tomorrow Wed March 18.  Currently do not know what dose patient is taking (2 mg qd?) or if they are.  Will get as much information as possible when RN calls tomorrow.

## 2019-03-05 NOTE — Telephone Encounter (Signed)
Physical therapist from [facility] the [place] [facility] needs inr orders for this pt who was recently released and put into [facility]. The number to the [place] [facility] is [phone] please call to give orders

## 2019-03-05 NOTE — Telephone Encounter (Signed)
New message   Per Jackelyn Poling at [facility] needs a call to check on an INR order. Please call to discuss.

## 2019-03-05 NOTE — Telephone Encounter (Signed)
Taken care of by [facility] on coumadin

## 2019-03-05 NOTE — Telephone Encounter (Signed)
Leave message to call back 

## 2019-03-06 ENCOUNTER — Ambulatory Visit (INDEPENDENT_AMBULATORY_CARE_PROVIDER_SITE_OTHER): Payer: Medicare HMO | Admitting: Internal Medicine

## 2019-03-06 DIAGNOSIS — Z5181 Encounter for therapeutic drug level monitoring: Secondary | ICD-10-CM | POA: Diagnosis not present

## 2019-03-06 DIAGNOSIS — I70213 Atherosclerosis of native arteries of extremities with intermittent claudication, bilateral legs: Secondary | ICD-10-CM | POA: Diagnosis not present

## 2019-03-06 DIAGNOSIS — I69351 Hemiplegia and hemiparesis following cerebral infarction affecting right dominant side: Secondary | ICD-10-CM | POA: Diagnosis not present

## 2019-03-06 DIAGNOSIS — M6281 Muscle weakness (generalized): Secondary | ICD-10-CM | POA: Diagnosis not present

## 2019-03-06 DIAGNOSIS — I69320 Aphasia following cerebral infarction: Secondary | ICD-10-CM | POA: Diagnosis not present

## 2019-03-06 DIAGNOSIS — I480 Paroxysmal atrial fibrillation: Secondary | ICD-10-CM

## 2019-03-06 DIAGNOSIS — I639 Cerebral infarction, unspecified: Secondary | ICD-10-CM

## 2019-03-06 DIAGNOSIS — Z48812 Encounter for surgical aftercare following surgery on the circulatory system: Secondary | ICD-10-CM | POA: Diagnosis not present

## 2019-03-06 DIAGNOSIS — Z7901 Long term (current) use of anticoagulants: Secondary | ICD-10-CM | POA: Diagnosis not present

## 2019-03-06 LAB — POCT INR: INR: 1.3 — AB (ref 2.0–3.0)

## 2019-03-08 ENCOUNTER — Ambulatory Visit (INDEPENDENT_AMBULATORY_CARE_PROVIDER_SITE_OTHER): Payer: Medicare HMO | Admitting: Pharmacist Clinician (PhC)/ Clinical Pharmacy Specialist

## 2019-03-08 DIAGNOSIS — I70213 Atherosclerosis of native arteries of extremities with intermittent claudication, bilateral legs: Secondary | ICD-10-CM | POA: Diagnosis not present

## 2019-03-08 DIAGNOSIS — Z48812 Encounter for surgical aftercare following surgery on the circulatory system: Secondary | ICD-10-CM | POA: Diagnosis not present

## 2019-03-08 DIAGNOSIS — Z7901 Long term (current) use of anticoagulants: Secondary | ICD-10-CM | POA: Diagnosis not present

## 2019-03-08 DIAGNOSIS — I480 Paroxysmal atrial fibrillation: Secondary | ICD-10-CM | POA: Diagnosis not present

## 2019-03-08 DIAGNOSIS — Z5181 Encounter for therapeutic drug level monitoring: Secondary | ICD-10-CM

## 2019-03-08 DIAGNOSIS — I69351 Hemiplegia and hemiparesis following cerebral infarction affecting right dominant side: Secondary | ICD-10-CM | POA: Diagnosis not present

## 2019-03-08 DIAGNOSIS — I639 Cerebral infarction, unspecified: Secondary | ICD-10-CM | POA: Diagnosis not present

## 2019-03-08 DIAGNOSIS — M6281 Muscle weakness (generalized): Secondary | ICD-10-CM | POA: Diagnosis not present

## 2019-03-08 DIAGNOSIS — I69320 Aphasia following cerebral infarction: Secondary | ICD-10-CM | POA: Diagnosis not present

## 2019-03-08 LAB — POCT INR: INR: 1.2 — AB (ref 2.0–3.0)

## 2019-03-11 ENCOUNTER — Ambulatory Visit (INDEPENDENT_AMBULATORY_CARE_PROVIDER_SITE_OTHER): Payer: Medicare HMO | Admitting: Pharmacist

## 2019-03-11 DIAGNOSIS — I480 Paroxysmal atrial fibrillation: Secondary | ICD-10-CM | POA: Diagnosis not present

## 2019-03-11 DIAGNOSIS — M6281 Muscle weakness (generalized): Secondary | ICD-10-CM | POA: Diagnosis not present

## 2019-03-11 DIAGNOSIS — Z7901 Long term (current) use of anticoagulants: Secondary | ICD-10-CM | POA: Diagnosis not present

## 2019-03-11 DIAGNOSIS — Z48812 Encounter for surgical aftercare following surgery on the circulatory system: Secondary | ICD-10-CM | POA: Diagnosis not present

## 2019-03-11 DIAGNOSIS — I69351 Hemiplegia and hemiparesis following cerebral infarction affecting right dominant side: Secondary | ICD-10-CM | POA: Diagnosis not present

## 2019-03-11 DIAGNOSIS — I70213 Atherosclerosis of native arteries of extremities with intermittent claudication, bilateral legs: Secondary | ICD-10-CM | POA: Diagnosis not present

## 2019-03-11 DIAGNOSIS — Z5181 Encounter for therapeutic drug level monitoring: Secondary | ICD-10-CM

## 2019-03-11 DIAGNOSIS — I639 Cerebral infarction, unspecified: Secondary | ICD-10-CM

## 2019-03-11 DIAGNOSIS — I69320 Aphasia following cerebral infarction: Secondary | ICD-10-CM | POA: Diagnosis not present

## 2019-03-11 LAB — POCT INR: INR: 2.2 (ref 2.0–3.0)

## 2019-03-13 ENCOUNTER — Telehealth (HOSPITAL_COMMUNITY): Payer: Self-pay | Admitting: Rehabilitation

## 2019-03-13 NOTE — Telephone Encounter (Signed)
The above patient or their representative was contacted and gave the following answers to these questions:         Do you have any of the following symptoms?  Fever                    Cough                   Shortness of breath  Do  you have any of the following other symptoms?    muscle pain         vomiting,        diarrhea        rash         weakness        red eye        abdominal pain         bruising          bruising or bleeding              joint pain           severe headache    Have you been in contact with someone who was or has been sick in the past 2 weeks?  Yes                 Unsure                         Unable to assess   Does the person that you were in contact with have any of the following symptoms?   Cough         shortness of breath           muscle pain         vomiting,            diarrhea            rash            weakness           fever            red eye           abdominal pain           bruising  or  bleeding                joint pain                severe headache               Have you  or someone you have been in contact with traveled internationally in th last month?         If yes, which countries?   Have you  or someone you have been in contact with traveled outside [place] in th last month?         If yes, which state and city?   COMMENTS OR ACTION PLAN FOR THIS PATIENT:          

## 2019-03-14 ENCOUNTER — Encounter: Payer: Self-pay | Admitting: Vascular Surgery

## 2019-03-14 ENCOUNTER — Encounter: Payer: Self-pay | Admitting: Family

## 2019-03-14 ENCOUNTER — Encounter: Payer: Medicare HMO | Admitting: Vascular Surgery

## 2019-03-14 DIAGNOSIS — Z48812 Encounter for surgical aftercare following surgery on the circulatory system: Secondary | ICD-10-CM | POA: Diagnosis not present

## 2019-03-14 DIAGNOSIS — I70213 Atherosclerosis of native arteries of extremities with intermittent claudication, bilateral legs: Secondary | ICD-10-CM | POA: Diagnosis not present

## 2019-03-14 DIAGNOSIS — M6281 Muscle weakness (generalized): Secondary | ICD-10-CM | POA: Diagnosis not present

## 2019-03-14 DIAGNOSIS — I69320 Aphasia following cerebral infarction: Secondary | ICD-10-CM | POA: Diagnosis not present

## 2019-03-14 DIAGNOSIS — I69351 Hemiplegia and hemiparesis following cerebral infarction affecting right dominant side: Secondary | ICD-10-CM | POA: Diagnosis not present

## 2019-03-14 DIAGNOSIS — I480 Paroxysmal atrial fibrillation: Secondary | ICD-10-CM | POA: Diagnosis not present

## 2019-03-14 DIAGNOSIS — Z7901 Long term (current) use of anticoagulants: Secondary | ICD-10-CM | POA: Diagnosis not present

## 2019-03-18 ENCOUNTER — Ambulatory Visit (INDEPENDENT_AMBULATORY_CARE_PROVIDER_SITE_OTHER): Payer: Medicare HMO | Admitting: Pharmacist Clinician (PhC)/ Clinical Pharmacy Specialist

## 2019-03-18 DIAGNOSIS — M6281 Muscle weakness (generalized): Secondary | ICD-10-CM | POA: Diagnosis not present

## 2019-03-18 DIAGNOSIS — Z48812 Encounter for surgical aftercare following surgery on the circulatory system: Secondary | ICD-10-CM | POA: Diagnosis not present

## 2019-03-18 DIAGNOSIS — I69320 Aphasia following cerebral infarction: Secondary | ICD-10-CM | POA: Diagnosis not present

## 2019-03-18 DIAGNOSIS — I69351 Hemiplegia and hemiparesis following cerebral infarction affecting right dominant side: Secondary | ICD-10-CM | POA: Diagnosis not present

## 2019-03-18 DIAGNOSIS — Z5181 Encounter for therapeutic drug level monitoring: Secondary | ICD-10-CM | POA: Diagnosis not present

## 2019-03-18 DIAGNOSIS — I480 Paroxysmal atrial fibrillation: Secondary | ICD-10-CM

## 2019-03-18 DIAGNOSIS — I639 Cerebral infarction, unspecified: Secondary | ICD-10-CM | POA: Diagnosis not present

## 2019-03-18 DIAGNOSIS — Z7901 Long term (current) use of anticoagulants: Secondary | ICD-10-CM | POA: Diagnosis not present

## 2019-03-18 DIAGNOSIS — I70213 Atherosclerosis of native arteries of extremities with intermittent claudication, bilateral legs: Secondary | ICD-10-CM | POA: Diagnosis not present

## 2019-03-18 LAB — POCT INR: INR: 3.4 — AB (ref 2.0–3.0)

## 2019-03-19 DIAGNOSIS — Z48812 Encounter for surgical aftercare following surgery on the circulatory system: Secondary | ICD-10-CM | POA: Diagnosis not present

## 2019-03-19 DIAGNOSIS — Z7901 Long term (current) use of anticoagulants: Secondary | ICD-10-CM | POA: Diagnosis not present

## 2019-03-19 DIAGNOSIS — I480 Paroxysmal atrial fibrillation: Secondary | ICD-10-CM | POA: Diagnosis not present

## 2019-03-19 DIAGNOSIS — I69351 Hemiplegia and hemiparesis following cerebral infarction affecting right dominant side: Secondary | ICD-10-CM | POA: Diagnosis not present

## 2019-03-19 DIAGNOSIS — I70213 Atherosclerosis of native arteries of extremities with intermittent claudication, bilateral legs: Secondary | ICD-10-CM | POA: Diagnosis not present

## 2019-03-19 DIAGNOSIS — I69320 Aphasia following cerebral infarction: Secondary | ICD-10-CM | POA: Diagnosis not present

## 2019-03-19 DIAGNOSIS — M6281 Muscle weakness (generalized): Secondary | ICD-10-CM | POA: Diagnosis not present

## 2019-03-25 ENCOUNTER — Ambulatory Visit (INDEPENDENT_AMBULATORY_CARE_PROVIDER_SITE_OTHER): Payer: Medicare HMO | Admitting: Pharmacist

## 2019-03-25 DIAGNOSIS — Z5181 Encounter for therapeutic drug level monitoring: Secondary | ICD-10-CM

## 2019-03-25 DIAGNOSIS — I480 Paroxysmal atrial fibrillation: Secondary | ICD-10-CM

## 2019-03-25 DIAGNOSIS — M6281 Muscle weakness (generalized): Secondary | ICD-10-CM | POA: Diagnosis not present

## 2019-03-25 DIAGNOSIS — Z48812 Encounter for surgical aftercare following surgery on the circulatory system: Secondary | ICD-10-CM | POA: Diagnosis not present

## 2019-03-25 DIAGNOSIS — Z7901 Long term (current) use of anticoagulants: Secondary | ICD-10-CM | POA: Diagnosis not present

## 2019-03-25 DIAGNOSIS — I639 Cerebral infarction, unspecified: Secondary | ICD-10-CM

## 2019-03-25 DIAGNOSIS — I69320 Aphasia following cerebral infarction: Secondary | ICD-10-CM | POA: Diagnosis not present

## 2019-03-25 DIAGNOSIS — I69351 Hemiplegia and hemiparesis following cerebral infarction affecting right dominant side: Secondary | ICD-10-CM | POA: Diagnosis not present

## 2019-03-25 DIAGNOSIS — I70213 Atherosclerosis of native arteries of extremities with intermittent claudication, bilateral legs: Secondary | ICD-10-CM | POA: Diagnosis not present

## 2019-03-25 LAB — POCT INR: INR: 3.3 — AB (ref 2.0–3.0)

## 2019-04-01 ENCOUNTER — Other Ambulatory Visit: Payer: Self-pay | Admitting: Cardiology

## 2019-04-01 ENCOUNTER — Ambulatory Visit: Payer: Medicare HMO | Admitting: Cardiology

## 2019-04-01 ENCOUNTER — Ambulatory Visit (INDEPENDENT_AMBULATORY_CARE_PROVIDER_SITE_OTHER): Payer: Medicare HMO | Admitting: Pharmacist

## 2019-04-01 DIAGNOSIS — I480 Paroxysmal atrial fibrillation: Secondary | ICD-10-CM

## 2019-04-01 DIAGNOSIS — I70213 Atherosclerosis of native arteries of extremities with intermittent claudication, bilateral legs: Secondary | ICD-10-CM | POA: Diagnosis not present

## 2019-04-01 DIAGNOSIS — M6281 Muscle weakness (generalized): Secondary | ICD-10-CM | POA: Diagnosis not present

## 2019-04-01 DIAGNOSIS — Z48812 Encounter for surgical aftercare following surgery on the circulatory system: Secondary | ICD-10-CM | POA: Diagnosis not present

## 2019-04-01 DIAGNOSIS — I639 Cerebral infarction, unspecified: Secondary | ICD-10-CM

## 2019-04-01 DIAGNOSIS — Z7901 Long term (current) use of anticoagulants: Secondary | ICD-10-CM | POA: Diagnosis not present

## 2019-04-01 DIAGNOSIS — Z5181 Encounter for therapeutic drug level monitoring: Secondary | ICD-10-CM

## 2019-04-01 DIAGNOSIS — I69320 Aphasia following cerebral infarction: Secondary | ICD-10-CM | POA: Diagnosis not present

## 2019-04-01 DIAGNOSIS — I69351 Hemiplegia and hemiparesis following cerebral infarction affecting right dominant side: Secondary | ICD-10-CM | POA: Diagnosis not present

## 2019-04-01 LAB — POCT INR: INR: 5.6 — AB (ref 2.0–3.0)

## 2019-04-01 MED ORDER — METOPROLOL TARTRATE 25 MG PO TABS: 25.0000 mg | ORAL_TABLET | Freq: Every day | ORAL | 6 refills | Status: DC

## 2019-04-01 NOTE — Telephone Encounter (Signed)
° ° ° °*  STAT* If patient is at the pharmacy, call can be transferred to refill team.   1. Which medications need to be refilled? (please list name of each medication and dose if known)  metoprolol tartrate (LOPRESSOR) 25 MG tablet  2. Which pharmacy/location (including street and city if local pharmacy) is medication to be sent to? [facility]  3. Do they need a 30 day or 90 day supply? 30

## 2019-04-02 ENCOUNTER — Other Ambulatory Visit: Payer: Self-pay | Admitting: Cardiology

## 2019-04-02 NOTE — Telephone Encounter (Signed)
Sent yesterday

## 2019-04-02 NOTE — Telephone Encounter (Signed)
°*  STAT* If patient is at the pharmacy, call can be transferred to refill team. ° ° °1. Which medications need to be refilled? (please list name of each medication and dose if known)  ° °metoprolol tartrate (LOPRESSOR) 25 MG tablet ° °2. Which pharmacy/location (including street and city if local pharmacy) is medication to be sent to? ° °[facility] - [place], [place] - [address] ° °3. Do they need a 30 day or 90 day supply?  ° °90 days ° °

## 2019-04-08 ENCOUNTER — Telehealth: Payer: Self-pay | Admitting: Cardiology

## 2019-04-08 ENCOUNTER — Ambulatory Visit (INDEPENDENT_AMBULATORY_CARE_PROVIDER_SITE_OTHER): Payer: Medicare HMO | Admitting: Pharmacist

## 2019-04-08 DIAGNOSIS — I69320 Aphasia following cerebral infarction: Secondary | ICD-10-CM | POA: Diagnosis not present

## 2019-04-08 DIAGNOSIS — I639 Cerebral infarction, unspecified: Secondary | ICD-10-CM

## 2019-04-08 DIAGNOSIS — Z5181 Encounter for therapeutic drug level monitoring: Secondary | ICD-10-CM | POA: Diagnosis not present

## 2019-04-08 DIAGNOSIS — I69351 Hemiplegia and hemiparesis following cerebral infarction affecting right dominant side: Secondary | ICD-10-CM | POA: Diagnosis not present

## 2019-04-08 DIAGNOSIS — I480 Paroxysmal atrial fibrillation: Secondary | ICD-10-CM | POA: Diagnosis not present

## 2019-04-08 DIAGNOSIS — I70213 Atherosclerosis of native arteries of extremities with intermittent claudication, bilateral legs: Secondary | ICD-10-CM | POA: Diagnosis not present

## 2019-04-08 DIAGNOSIS — Z48812 Encounter for surgical aftercare following surgery on the circulatory system: Secondary | ICD-10-CM | POA: Diagnosis not present

## 2019-04-08 DIAGNOSIS — M6281 Muscle weakness (generalized): Secondary | ICD-10-CM | POA: Diagnosis not present

## 2019-04-08 DIAGNOSIS — Z7901 Long term (current) use of anticoagulants: Secondary | ICD-10-CM | POA: Diagnosis not present

## 2019-04-08 LAB — POCT INR: INR: 2.5 (ref 2.0–3.0)

## 2019-04-10 NOTE — Telephone Encounter (Signed)
Error

## 2019-04-11 ENCOUNTER — Telehealth: Payer: Self-pay | Admitting: *Deleted

## 2019-04-11 NOTE — Telephone Encounter (Signed)
error 

## 2019-04-15 ENCOUNTER — Other Ambulatory Visit: Payer: Self-pay | Admitting: Pharmacist Clinician (PhC)/ Clinical Pharmacy Specialist

## 2019-04-15 ENCOUNTER — Ambulatory Visit (INDEPENDENT_AMBULATORY_CARE_PROVIDER_SITE_OTHER): Payer: Medicare HMO | Admitting: Pharmacist Clinician (PhC)/ Clinical Pharmacy Specialist

## 2019-04-15 DIAGNOSIS — M6281 Muscle weakness (generalized): Secondary | ICD-10-CM | POA: Diagnosis not present

## 2019-04-15 DIAGNOSIS — I639 Cerebral infarction, unspecified: Secondary | ICD-10-CM | POA: Diagnosis not present

## 2019-04-15 DIAGNOSIS — Z5181 Encounter for therapeutic drug level monitoring: Secondary | ICD-10-CM | POA: Diagnosis not present

## 2019-04-15 DIAGNOSIS — Z48812 Encounter for surgical aftercare following surgery on the circulatory system: Secondary | ICD-10-CM | POA: Diagnosis not present

## 2019-04-15 DIAGNOSIS — I69320 Aphasia following cerebral infarction: Secondary | ICD-10-CM | POA: Diagnosis not present

## 2019-04-15 DIAGNOSIS — I480 Paroxysmal atrial fibrillation: Secondary | ICD-10-CM

## 2019-04-15 DIAGNOSIS — I70213 Atherosclerosis of native arteries of extremities with intermittent claudication, bilateral legs: Secondary | ICD-10-CM | POA: Diagnosis not present

## 2019-04-15 DIAGNOSIS — Z7901 Long term (current) use of anticoagulants: Secondary | ICD-10-CM | POA: Diagnosis not present

## 2019-04-15 DIAGNOSIS — I69351 Hemiplegia and hemiparesis following cerebral infarction affecting right dominant side: Secondary | ICD-10-CM | POA: Diagnosis not present

## 2019-04-15 LAB — POCT INR: INR: 2.9 (ref 2.0–3.0)

## 2019-04-15 MED ORDER — WARFARIN SODIUM 5 MG PO TABS: 5.0000 mg | ORAL_TABLET | Freq: Every day | ORAL | 0 refills | Status: DC

## 2019-04-16 ENCOUNTER — Telehealth: Payer: Self-pay | Admitting: Cardiology

## 2019-04-16 NOTE — Telephone Encounter (Signed)
Virtual Visit Pre-Appointment Phone Call  "(Name), I am calling you today to discuss your upcoming appointment. We are currently trying to limit exposure to the virus that causes COVID-19 by seeing patients at home rather than in the office."  "What is the BEST phone number to call the day of the visit?" - I   [phone]  Do you have or have access to (through a family member/friend) a smartphone with video capability that we can use for your visit?" a. If yes - list this number in appt notes as cell (if different from BEST phone #) and list the appointment type as a VIDEO visit in appointment notes b. If no - list the appointment type as a PHONE visit in appointment notes  2. Confirm consent - "In the setting of the current Covid19 crisis, you are scheduled for a (phone or video) visit with your provider on (date) at (time).  Just as we do with many in-office visits, in order for you to participate in this visit, we must obtain consent.  If you'd like, I can send this to your mychart (if signed up) or email for you to review.  Otherwise, I can obtain your verbal consent now.  All virtual visits are billed to your insurance company just like a normal visit would be.  By agreeing to a virtual visit, we'd like you to understand that the technology does not allow for your provider to perform an examination, and thus may limit your provider's ability to fully assess your condition. If your provider identifies any concerns that need to be evaluated in person, we will make arrangements to do so.  Finally, though the technology is pretty good, we cannot assure that it will always work on either your or our end, and in the setting of a video visit, we may have to convert it to a phone-only visit.  In either situation, we cannot ensure that we have a secure connection.  Are you willing to proceed?" STAFF: Did the patient verbally acknowledge consent to telehealth visit? Document YES/NO here:YES   3. Advise patient to be prepared - "Two hours prior to your appointment, go ahead and check your blood pressure, pulse, oxygen saturation, and your weight (if you have the equipment to check those) and write them all down. When your visit starts, your provider will ask you for this information. If you have an Apple Watch or Kardia device, please plan to have heart rate information ready on the day of your appointment. Please have a pen and paper handy nearby the day of the visit as well."  4. Give patient instructions for MyChart download to smartphone OR Doximity/Doxy.me as below if video visit (depending on what platform provider is using)  5. Inform patient they will receive a phone call 15 minutes prior to their appointment time (may be from unknown caller ID) so they should be prepared to answer    TELEPHONE CALL NOTE  Shahil P Bucholz has been deemed a candidate for a follow-up tele-health visit to limit community exposure during the Covid-19 pandemic. I spoke with the patient via phone to ensure availability of phone/video source, confirm preferred email & phone number, and discuss instructions and expectations.  I reminded [name] [name] to be prepared with any vital sign and/or heart rhythm information that could potentially be obtained via home monitoring, at the time of his visit. I reminded [name] [name] to expect a phone call prior to his visit.  Vicky  T Slaughter 04/16/2019 1:45 PM   INSTRUCTIONS FOR DOWNLOADING THE MYCHART APP TO SMARTPHONE  - The patient must first make sure to have activated MyChart and know their login information - If Apple, go to [employer] and type in MyChart in the search bar and download the app. If Android, ask patient to go to [employer] and type in [place] in the search bar and download the app. The app is free but as with any other app downloads, their phone may require them to verify saved payment information or Apple/Android  password.  - The patient will need to then log into the app with their MyChart username and password, and select [facility] as their healthcare provider to link the account. When it is time for your visit, go to the MyChart app, find appointments, and click Begin Video Visit. Be sure to Select Allow for your device to access the Microphone and Camera for your visit. You will then be connected, and your provider will be with you shortly.  **If they have any issues connecting, or need assistance please contact MyChart service desk [phone])**  **If using a computer, in order to ensure the best quality for their visit they will need to use either of the following Internet Browsers: [employer] or Google Chrome**  IF USING DOXIMITY or DOXY.ME - The patient will receive a link just prior to their visit by text.     FULL LENGTH CONSENT FOR TELE-HEALTH VISIT   I hereby voluntarily request, consent and authorize [facility] and its employed or contracted physicians, physician assistants, nurse practitioners or other licensed health care professionals (the Practitioner), to provide me with telemedicine health care services (the Services") as deemed necessary by the treating Practitioner. I acknowledge and consent to receive the Services by the Practitioner via telemedicine. I understand that the telemedicine visit will involve communicating with the Practitioner through live audiovisual communication technology and the disclosure of certain medical information by electronic transmission. I acknowledge that I have been given the opportunity to request an in-person assessment or other available alternative prior to the telemedicine visit and am voluntarily participating in the telemedicine visit.  I understand that I have the right to withhold or withdraw my consent to the use of telemedicine in the course of my care at any time, without affecting my right to future care or treatment,  and that the Practitioner or I may terminate the telemedicine visit at any time. I understand that I have the right to inspect all information obtained and/or recorded in the course of the telemedicine visit and may receive copies of available information for a reasonable fee.  I understand that some of the potential risks of receiving the Services via telemedicine include:   Delay or interruption in medical evaluation due to technological equipment failure or disruption;  Information transmitted may not be sufficient (e.g. poor resolution of images) to allow for appropriate medical decision making by the Practitioner; and/or   In rare instances, security protocols could fail, causing a breach of personal health information.  Furthermore, I acknowledge that it is my responsibility to provide information about my medical history, conditions and care that is complete and accurate to the best of my ability. I acknowledge that Practitioner's advice, recommendations, and/or decision may be based on factors not within their control, such as incomplete or inaccurate data provided by me or distortions of diagnostic images or specimens that may result from electronic transmissions. I understand that the practice  of medicine is not an Chief Strategy Officer and that Practitioner makes no warranties or guarantees regarding treatment outcomes. I acknowledge that I will receive a copy of this consent concurrently upon execution via email to the email address I last provided but may also request a printed copy by calling the office of [place].    I understand that my insurance will be billed for this visit.   I have read or had this consent read to me.  I understand the contents of this consent, which adequately explains the benefits and risks of the Services being provided via telemedicine.   I have been provided ample opportunity to ask questions regarding this consent and the Services and have had my questions  answered to my satisfaction.  I give my informed consent for the services to be provided through the use of telemedicine in my medical care  By participating in this telemedicine visit I agree to the above.

## 2019-04-16 NOTE — Progress Notes (Signed)
Virtual Visit via Video Note   This visit type was conducted due to national recommendations for restrictions regarding the COVID-19 Pandemic (e.g. social distancing) in an effort to limit this patient's exposure and mitigate transmission in our community.  Due to his co-morbid illnesses, this patient is at least at moderate risk for complications without adequate follow up.  This format is felt to be most appropriate for this patient at this time.  All issues noted in this document were discussed and addressed.  A limited physical exam was performed with this format.  Please refer to the patient's chart for his consent to telehealth for [facility].  Evaluation Performed:  Follow-up visit  Date:  04/17/2019   ID:  [name] [name], [name] [name] 1951-05-01, MRN [id number]  Patient Location: Home Provider Location: Home  PCP:  Neale Burly, MD  Cardiologist:  Minus Breeding, MD  Electrophysiologist:  None   Chief Complaint:  Chest pain  History of Present Illness:    [name] [name] is a 68 y.o. male with PVD status post aortobifemoral bypass with bilateral common femoral endarterectomy and profundoplasty with bilateral iliac thrombectomy by Dr. Oneida Alar on 01/28/2019.  Post op he had atrial fib that was somewhat difficult to control.  He had AKI.  He had a post op ileus and a protracted course. He did eventually sustain NSR.     He actually looks pretty good right now.  He has some right-sided hemiparesis and some dysarthria.  His sister comes over to help with him.  He has a nurse coming once a week.  He is eating better.  He is not noticing any palpitations, presyncope or syncope.  There is no chest discomfort, neck or arm discomfort.  He has had no new shortness of breath, PND or orthopnea.  The patient does not have symptoms concerning for COVID-19 infection (fever, chills, cough, or new shortness of breath).    Past Medical History:  Diagnosis Date  . Afib ([place])    a. Paroxysmal  --> previous issues with bradycardia while on Amiodarone and he refused PPM placement. Rate-control strategy pursued.   . Aortic stenosis   . Chronic kidney disease    unknown problem to see kidney  doctor in March in [place]  . Dysrhythmia    Atril Fib  . Peripheral vascular disease ([place])   . Stroke [facility])    Aphasia, right sided weakness    Past Surgical History:  Procedure Laterality Date  . AORTA - BILATERAL FEMORAL ARTERY BYPASS GRAFT Bilateral 01/28/2019   Procedure: AORTA BIFEMORAL BYPASS GRAFT;  Surgeon: Elam Dutch, MD;  Location: [facility];  Service: Vascular;  Laterality: Bilateral;  . AORTA BIFEMORAL BYPASS GRAFT  01/28/2019  . DIALYSIS/PERMA CATHETER INSERTION N/A 02/15/2019   Procedure: DIALYSIS/PERMA CATHETER INSERTION;  Surgeon: Elam Dutch, MD;  Location: [facility];  Service: Cardiovascular;  Laterality: N/A;  . EMBOLECTOMY Right 08/30/2017   Procedure: Right popliteal tibial endarectomy with patch angioplasty;  Surgeon: Elam Dutch, MD;  Location: [facility];  Service: Vascular;  Laterality: Right;  . ENDARTERECTOMY FEMORAL Bilateral 01/28/2019   Procedure: BILATERAL FEMORAL ENDARTERECTOMY WITH PROFUNDAPLASTY;  Surgeon: Elam Dutch, MD;  Location: [place] [facility];  Service: Vascular;  Laterality: Bilateral;  . FASCIOTOMY CLOSURE Bilateral 08/28/2017   Procedure: FASCIOTOMY CLOSURE/BILATERAL  lower legs;  Surgeon: Elam Dutch, MD;  Location: [facility];  Service: Vascular;  Laterality: Bilateral;  . FEMORAL-POPLITEAL BYPASS GRAFT Bilateral 08/26/2017   Procedure: Bilateral FEMORAL  EMBOLECTOMY with  BILATERAL Four Compartment  FASCIOTOMIES.;  Surgeon: Elam Dutch, MD;  [facility];  Service: Vascular;  Laterality: Bilateral;  . FEMORAL-POPLITEAL BYPASS GRAFT Bilateral 01/28/2019   Procedure: BILATERAL  ILIAC THROMBECTOMY;  Surgeon: Elam Dutch, MD;  Location: [facility] OR;  Service: Vascular;  Laterality: Bilateral;     Current Meds  Medication Sig  .  aspirin 325 MG tablet Take 325 mg by mouth daily.  Marland Kitchen atorvastatin (LIPITOR) 20 MG tablet Take 1 tablet (20 mg total) by mouth daily.  . metoprolol tartrate (LOPRESSOR) 25 MG tablet Take 1 tablet (25 mg total) by mouth daily.  Marland Kitchen warfarin (COUMADIN) 5 MG tablet Take 1 tablet (5 mg total) by mouth daily.     Allergies:   Patient has no known allergies.   Social History   Tobacco Use  . Smoking status: Never Smoker  . Smokeless tobacco: Never Used  Substance Use Topics  . Alcohol use: No  . Drug use: No     Family Hx: The patient's family history includes Stroke in his sister.  ROS:   Please see the history of present illness.     All other systems reviewed and are negative.   Prior CV studies:   The following studies were reviewed today:  Hospital recrds  Labs/Other Tests and Data Reviewed:    EKG:  No ECG reviewed.  Recent Labs: 06/20/2018: B Natriuretic Peptide 164.0 02/25/2019: Magnesium 1.8 02/26/2019: ALT 29; BUN 18; Creatinine, Ser 1.31; Potassium 4.0; Sodium 134 02/28/2019: Hemoglobin 10.9; Platelets 250   Recent Lipid Panel Lab Results  Component Value Date/Time   CHOL 149 08/23/2017 04:25 AM   TRIG 44 02/25/2019 04:37 AM   HDL 51 08/23/2017 04:25 AM   CHOLHDL 2.9 08/23/2017 04:25 AM   LDLCALC 78 08/23/2017 04:25 AM    Wt Readings from Last 3 Encounters:  04/17/19 165 lb (74.8 kg)  02/22/19 177 lb 4 oz (80.4 kg)  01/22/19 191 lb 2.2 oz (86.7 kg)     Objective:    Vital Signs:  BP 118/70 (Patient Position: Sitting)   Temp (!) 97.1 F (36.2 C)   Ht '6\' 4"'  (1.93 m)   Wt 165 lb (74.8 kg)   BMI 20.08 kg/m    VITAL SIGNS:  reviewed GEN:  no acute distress EYES:  sclerae anicteric, EOMI - Extraocular Movements Intact RESPIRATORY:  normal respiratory effort, symmetric expansion NEURO:  Right hemiparesis PSYCH:  normal affect  EXT: No edema  ASSESSMENT & PLAN:    ATRIAL FLUTTER FIB:  I suspect that he is in normal rhythm.  The nurse does come and  check his heart rate and there is no comment that it is rapid.  His blood pressures have been stable.  Because of his significant vascular disease and previous CVA he is on both warfarin which is being followed and higher dose aspirin which are reduced to 81 mg twice daily.  He should be taking his metoprolol twice daily.  May have him come back and get blood work at the [place] office in about a month with a be met in a CBC.  However, no change in his therapy as he seems to be doing well.  EDEMA: He did have some leg edema in the hospital but this is resolved.  COVID-19 Education: The signs and symptoms of COVID-19 were discussed with the patient and how to seek care for testing (follow up with PCP or arrange E-visit).  The importance of social distancing  was discussed today.  Time:   Today, I have spent 25 minutes with the patient with telehealth technology discussing the above problems.     Medication Adjustments/Labs and Tests Ordered: Current medicines are reviewed at length with the patient today.  Concerns regarding medicines are outlined above.   Tests Ordered: No orders of the defined types were placed in this encounter.   Medication Changes: No orders of the defined types were placed in this encounter.   Disposition:  Follow up in [place] in 4 months.  BMET in one month.   Signed, Minus Breeding, MD  04/17/2019 10:16 AM    [place]

## 2019-04-17 ENCOUNTER — Encounter: Payer: Self-pay | Admitting: Cardiology

## 2019-04-17 ENCOUNTER — Telehealth (INDEPENDENT_AMBULATORY_CARE_PROVIDER_SITE_OTHER): Payer: Medicare HMO | Admitting: Cardiology

## 2019-04-17 VITALS — BP 118/70 | Temp 97.1°F | Ht 76.0 in | Wt 165.0 lb

## 2019-04-17 DIAGNOSIS — I480 Paroxysmal atrial fibrillation: Secondary | ICD-10-CM | POA: Diagnosis not present

## 2019-04-17 DIAGNOSIS — Z7189 Other specified counseling: Secondary | ICD-10-CM

## 2019-04-17 DIAGNOSIS — Z5181 Encounter for therapeutic drug level monitoring: Secondary | ICD-10-CM

## 2019-04-17 MED ORDER — ASPIRIN EC 81 MG PO TBEC: 81.0000 mg | DELAYED_RELEASE_TABLET | Freq: Every day | ORAL | 0 refills | Status: AC

## 2019-04-17 MED ORDER — METOPROLOL TARTRATE 25 MG PO TABS: 25.0000 mg | ORAL_TABLET | Freq: Two times a day (BID) | ORAL | 3 refills | Status: DC

## 2019-04-17 NOTE — Patient Instructions (Signed)
Medication Instructions:  Please decrease your Asprin to 81 mg a day.  Increase Metoprolol to 25 mg twice a day.  Continue all other medications as listed.  If you need a refill on your cardiac medications before your next appointment, please call your pharmacy.   Lab work: Please have blood work in 4 months when you see [place] in [place]. (CBC,BMP)  If you have labs (blood work) drawn today and your tests are completely normal, you will receive your results only by: Marland Kitchen MyChart Message (if you have MyChart) OR . A paper copy in the mail If you have any lab test that is abnormal or we need to change your treatment, we will call you to review the results.  Follow-Up: Follow up in 4 months with [place] in the [place] office.  Thank you for choosing [facility]!!

## 2019-04-22 ENCOUNTER — Ambulatory Visit (INDEPENDENT_AMBULATORY_CARE_PROVIDER_SITE_OTHER): Payer: Medicare HMO | Admitting: Pharmacist Clinician (PhC)/ Clinical Pharmacy Specialist

## 2019-04-22 DIAGNOSIS — I480 Paroxysmal atrial fibrillation: Secondary | ICD-10-CM

## 2019-04-22 DIAGNOSIS — Z7901 Long term (current) use of anticoagulants: Secondary | ICD-10-CM | POA: Diagnosis not present

## 2019-04-22 DIAGNOSIS — Z5181 Encounter for therapeutic drug level monitoring: Secondary | ICD-10-CM

## 2019-04-22 DIAGNOSIS — I70213 Atherosclerosis of native arteries of extremities with intermittent claudication, bilateral legs: Secondary | ICD-10-CM | POA: Diagnosis not present

## 2019-04-22 DIAGNOSIS — I69351 Hemiplegia and hemiparesis following cerebral infarction affecting right dominant side: Secondary | ICD-10-CM | POA: Diagnosis not present

## 2019-04-22 DIAGNOSIS — I639 Cerebral infarction, unspecified: Secondary | ICD-10-CM

## 2019-04-22 DIAGNOSIS — I69320 Aphasia following cerebral infarction: Secondary | ICD-10-CM | POA: Diagnosis not present

## 2019-04-22 LAB — POCT INR: INR: 2.9 (ref 2.0–3.0)

## 2019-04-26 DIAGNOSIS — Z7901 Long term (current) use of anticoagulants: Secondary | ICD-10-CM | POA: Diagnosis not present

## 2019-04-26 DIAGNOSIS — I69320 Aphasia following cerebral infarction: Secondary | ICD-10-CM | POA: Diagnosis not present

## 2019-04-26 DIAGNOSIS — I69351 Hemiplegia and hemiparesis following cerebral infarction affecting right dominant side: Secondary | ICD-10-CM | POA: Diagnosis not present

## 2019-04-26 DIAGNOSIS — I480 Paroxysmal atrial fibrillation: Secondary | ICD-10-CM | POA: Diagnosis not present

## 2019-04-26 DIAGNOSIS — I70213 Atherosclerosis of native arteries of extremities with intermittent claudication, bilateral legs: Secondary | ICD-10-CM | POA: Diagnosis not present

## 2019-04-26 DIAGNOSIS — Z5181 Encounter for therapeutic drug level monitoring: Secondary | ICD-10-CM | POA: Diagnosis not present

## 2019-04-30 DIAGNOSIS — Z5181 Encounter for therapeutic drug level monitoring: Secondary | ICD-10-CM | POA: Diagnosis not present

## 2019-04-30 DIAGNOSIS — I69351 Hemiplegia and hemiparesis following cerebral infarction affecting right dominant side: Secondary | ICD-10-CM | POA: Diagnosis not present

## 2019-04-30 DIAGNOSIS — I70213 Atherosclerosis of native arteries of extremities with intermittent claudication, bilateral legs: Secondary | ICD-10-CM | POA: Diagnosis not present

## 2019-04-30 DIAGNOSIS — Z7901 Long term (current) use of anticoagulants: Secondary | ICD-10-CM | POA: Diagnosis not present

## 2019-04-30 DIAGNOSIS — I69320 Aphasia following cerebral infarction: Secondary | ICD-10-CM | POA: Diagnosis not present

## 2019-04-30 DIAGNOSIS — I480 Paroxysmal atrial fibrillation: Secondary | ICD-10-CM | POA: Diagnosis not present

## 2019-05-06 ENCOUNTER — Ambulatory Visit (INDEPENDENT_AMBULATORY_CARE_PROVIDER_SITE_OTHER): Payer: Medicare HMO | Admitting: Pharmacist

## 2019-05-06 DIAGNOSIS — Z5181 Encounter for therapeutic drug level monitoring: Secondary | ICD-10-CM | POA: Diagnosis not present

## 2019-05-06 DIAGNOSIS — I70213 Atherosclerosis of native arteries of extremities with intermittent claudication, bilateral legs: Secondary | ICD-10-CM | POA: Diagnosis not present

## 2019-05-06 DIAGNOSIS — I69320 Aphasia following cerebral infarction: Secondary | ICD-10-CM | POA: Diagnosis not present

## 2019-05-06 DIAGNOSIS — I480 Paroxysmal atrial fibrillation: Secondary | ICD-10-CM | POA: Diagnosis not present

## 2019-05-06 DIAGNOSIS — I639 Cerebral infarction, unspecified: Secondary | ICD-10-CM

## 2019-05-06 DIAGNOSIS — I69351 Hemiplegia and hemiparesis following cerebral infarction affecting right dominant side: Secondary | ICD-10-CM | POA: Diagnosis not present

## 2019-05-06 DIAGNOSIS — Z7901 Long term (current) use of anticoagulants: Secondary | ICD-10-CM | POA: Diagnosis not present

## 2019-05-06 LAB — POCT INR: INR: 2.5 (ref 2.0–3.0)

## 2019-05-20 ENCOUNTER — Ambulatory Visit (INDEPENDENT_AMBULATORY_CARE_PROVIDER_SITE_OTHER): Payer: Medicare HMO | Admitting: Pharmacist

## 2019-05-20 DIAGNOSIS — Z5181 Encounter for therapeutic drug level monitoring: Secondary | ICD-10-CM

## 2019-05-20 DIAGNOSIS — I639 Cerebral infarction, unspecified: Secondary | ICD-10-CM | POA: Diagnosis not present

## 2019-05-20 DIAGNOSIS — I69320 Aphasia following cerebral infarction: Secondary | ICD-10-CM | POA: Diagnosis not present

## 2019-05-20 DIAGNOSIS — Z7901 Long term (current) use of anticoagulants: Secondary | ICD-10-CM | POA: Diagnosis not present

## 2019-05-20 DIAGNOSIS — I480 Paroxysmal atrial fibrillation: Secondary | ICD-10-CM

## 2019-05-20 DIAGNOSIS — I69351 Hemiplegia and hemiparesis following cerebral infarction affecting right dominant side: Secondary | ICD-10-CM | POA: Diagnosis not present

## 2019-05-20 DIAGNOSIS — I70213 Atherosclerosis of native arteries of extremities with intermittent claudication, bilateral legs: Secondary | ICD-10-CM | POA: Diagnosis not present

## 2019-05-20 LAB — POCT INR: INR: 1.9 — AB (ref 2.0–3.0)

## 2019-06-03 ENCOUNTER — Ambulatory Visit (INDEPENDENT_AMBULATORY_CARE_PROVIDER_SITE_OTHER): Payer: Medicare HMO | Admitting: Internal Medicine

## 2019-06-03 DIAGNOSIS — I639 Cerebral infarction, unspecified: Secondary | ICD-10-CM | POA: Diagnosis not present

## 2019-06-03 DIAGNOSIS — I480 Paroxysmal atrial fibrillation: Secondary | ICD-10-CM | POA: Diagnosis not present

## 2019-06-03 DIAGNOSIS — I69320 Aphasia following cerebral infarction: Secondary | ICD-10-CM | POA: Diagnosis not present

## 2019-06-03 DIAGNOSIS — Z5181 Encounter for therapeutic drug level monitoring: Secondary | ICD-10-CM | POA: Diagnosis not present

## 2019-06-03 DIAGNOSIS — Z7901 Long term (current) use of anticoagulants: Secondary | ICD-10-CM | POA: Diagnosis not present

## 2019-06-03 DIAGNOSIS — I70213 Atherosclerosis of native arteries of extremities with intermittent claudication, bilateral legs: Secondary | ICD-10-CM | POA: Diagnosis not present

## 2019-06-03 DIAGNOSIS — I69351 Hemiplegia and hemiparesis following cerebral infarction affecting right dominant side: Secondary | ICD-10-CM | POA: Diagnosis not present

## 2019-06-03 LAB — POCT INR: INR: 1.9 — AB (ref 2.0–3.0)

## 2019-06-18 ENCOUNTER — Ambulatory Visit (INDEPENDENT_AMBULATORY_CARE_PROVIDER_SITE_OTHER): Payer: Medicare HMO | Admitting: Pharmacist

## 2019-06-18 DIAGNOSIS — I639 Cerebral infarction, unspecified: Secondary | ICD-10-CM | POA: Diagnosis not present

## 2019-06-18 DIAGNOSIS — Z7901 Long term (current) use of anticoagulants: Secondary | ICD-10-CM | POA: Diagnosis not present

## 2019-06-18 DIAGNOSIS — I480 Paroxysmal atrial fibrillation: Secondary | ICD-10-CM

## 2019-06-18 DIAGNOSIS — I69351 Hemiplegia and hemiparesis following cerebral infarction affecting right dominant side: Secondary | ICD-10-CM | POA: Diagnosis not present

## 2019-06-18 DIAGNOSIS — I70213 Atherosclerosis of native arteries of extremities with intermittent claudication, bilateral legs: Secondary | ICD-10-CM | POA: Diagnosis not present

## 2019-06-18 DIAGNOSIS — I69320 Aphasia following cerebral infarction: Secondary | ICD-10-CM | POA: Diagnosis not present

## 2019-06-18 DIAGNOSIS — Z5181 Encounter for therapeutic drug level monitoring: Secondary | ICD-10-CM

## 2019-06-18 LAB — POCT INR: INR: 2.7 (ref 2.0–3.0)

## 2019-06-25 DIAGNOSIS — Z5181 Encounter for therapeutic drug level monitoring: Secondary | ICD-10-CM | POA: Diagnosis not present

## 2019-06-25 DIAGNOSIS — I69351 Hemiplegia and hemiparesis following cerebral infarction affecting right dominant side: Secondary | ICD-10-CM | POA: Diagnosis not present

## 2019-06-25 DIAGNOSIS — I69320 Aphasia following cerebral infarction: Secondary | ICD-10-CM | POA: Diagnosis not present

## 2019-06-25 DIAGNOSIS — I480 Paroxysmal atrial fibrillation: Secondary | ICD-10-CM | POA: Diagnosis not present

## 2019-06-25 DIAGNOSIS — Z7901 Long term (current) use of anticoagulants: Secondary | ICD-10-CM | POA: Diagnosis not present

## 2019-06-25 DIAGNOSIS — I70213 Atherosclerosis of native arteries of extremities with intermittent claudication, bilateral legs: Secondary | ICD-10-CM | POA: Diagnosis not present

## 2019-07-02 ENCOUNTER — Ambulatory Visit (INDEPENDENT_AMBULATORY_CARE_PROVIDER_SITE_OTHER): Payer: Medicare HMO | Admitting: Pharmacist

## 2019-07-02 DIAGNOSIS — I69351 Hemiplegia and hemiparesis following cerebral infarction affecting right dominant side: Secondary | ICD-10-CM | POA: Diagnosis not present

## 2019-07-02 DIAGNOSIS — I480 Paroxysmal atrial fibrillation: Secondary | ICD-10-CM | POA: Diagnosis not present

## 2019-07-02 DIAGNOSIS — I69320 Aphasia following cerebral infarction: Secondary | ICD-10-CM | POA: Diagnosis not present

## 2019-07-02 DIAGNOSIS — I639 Cerebral infarction, unspecified: Secondary | ICD-10-CM

## 2019-07-02 DIAGNOSIS — I70213 Atherosclerosis of native arteries of extremities with intermittent claudication, bilateral legs: Secondary | ICD-10-CM | POA: Diagnosis not present

## 2019-07-02 DIAGNOSIS — Z7901 Long term (current) use of anticoagulants: Secondary | ICD-10-CM | POA: Diagnosis not present

## 2019-07-02 DIAGNOSIS — Z5181 Encounter for therapeutic drug level monitoring: Secondary | ICD-10-CM

## 2019-07-02 LAB — POCT INR: INR: 3.7 — AB (ref 2.0–3.0)

## 2019-07-10 ENCOUNTER — Telehealth: Payer: Self-pay | Admitting: *Deleted

## 2019-07-10 ENCOUNTER — Other Ambulatory Visit: Payer: Self-pay | Admitting: Cardiology

## 2019-07-10 ENCOUNTER — Ambulatory Visit (INDEPENDENT_AMBULATORY_CARE_PROVIDER_SITE_OTHER): Payer: Medicare HMO | Admitting: *Deleted

## 2019-07-10 DIAGNOSIS — I639 Cerebral infarction, unspecified: Secondary | ICD-10-CM | POA: Diagnosis not present

## 2019-07-10 DIAGNOSIS — I69320 Aphasia following cerebral infarction: Secondary | ICD-10-CM | POA: Diagnosis not present

## 2019-07-10 DIAGNOSIS — Z5181 Encounter for therapeutic drug level monitoring: Secondary | ICD-10-CM

## 2019-07-10 DIAGNOSIS — I70213 Atherosclerosis of native arteries of extremities with intermittent claudication, bilateral legs: Secondary | ICD-10-CM | POA: Diagnosis not present

## 2019-07-10 DIAGNOSIS — I69351 Hemiplegia and hemiparesis following cerebral infarction affecting right dominant side: Secondary | ICD-10-CM | POA: Diagnosis not present

## 2019-07-10 DIAGNOSIS — I480 Paroxysmal atrial fibrillation: Secondary | ICD-10-CM

## 2019-07-10 DIAGNOSIS — Z7901 Long term (current) use of anticoagulants: Secondary | ICD-10-CM | POA: Diagnosis not present

## 2019-07-10 LAB — POCT INR: INR: 4.1 — AB (ref 2.0–3.0)

## 2019-07-10 NOTE — Patient Instructions (Signed)
Pt told [facility] Nurse he has been taking 1 tablet daily Took coumadin this morning. Hold warfarin tomorrow then decrease dose to 1 tablet daily except 1/2 tablet on Tuesdays and Fridays.  Repeat INR in 1 week.  Orders given to [employer]

## 2019-07-10 NOTE — Telephone Encounter (Signed)
Done.  See coumadin note. 

## 2019-07-10 NOTE — Telephone Encounter (Signed)
Has already taken coumadin today.

## 2019-07-17 ENCOUNTER — Ambulatory Visit (INDEPENDENT_AMBULATORY_CARE_PROVIDER_SITE_OTHER): Payer: Medicare HMO | Admitting: Pharmacist

## 2019-07-17 DIAGNOSIS — I69351 Hemiplegia and hemiparesis following cerebral infarction affecting right dominant side: Secondary | ICD-10-CM | POA: Diagnosis not present

## 2019-07-17 DIAGNOSIS — Z5181 Encounter for therapeutic drug level monitoring: Secondary | ICD-10-CM | POA: Diagnosis not present

## 2019-07-17 DIAGNOSIS — I480 Paroxysmal atrial fibrillation: Secondary | ICD-10-CM | POA: Diagnosis not present

## 2019-07-17 DIAGNOSIS — I639 Cerebral infarction, unspecified: Secondary | ICD-10-CM | POA: Diagnosis not present

## 2019-07-17 DIAGNOSIS — Z7901 Long term (current) use of anticoagulants: Secondary | ICD-10-CM | POA: Diagnosis not present

## 2019-07-17 DIAGNOSIS — I70213 Atherosclerosis of native arteries of extremities with intermittent claudication, bilateral legs: Secondary | ICD-10-CM | POA: Diagnosis not present

## 2019-07-17 DIAGNOSIS — I69320 Aphasia following cerebral infarction: Secondary | ICD-10-CM | POA: Diagnosis not present

## 2019-07-17 LAB — POCT INR: INR: 2.2 (ref 2.0–3.0)

## 2019-07-25 ENCOUNTER — Ambulatory Visit (INDEPENDENT_AMBULATORY_CARE_PROVIDER_SITE_OTHER): Payer: Medicare HMO | Admitting: Cardiology

## 2019-07-25 DIAGNOSIS — I69351 Hemiplegia and hemiparesis following cerebral infarction affecting right dominant side: Secondary | ICD-10-CM | POA: Diagnosis not present

## 2019-07-25 DIAGNOSIS — I69320 Aphasia following cerebral infarction: Secondary | ICD-10-CM | POA: Diagnosis not present

## 2019-07-25 DIAGNOSIS — I480 Paroxysmal atrial fibrillation: Secondary | ICD-10-CM

## 2019-07-25 DIAGNOSIS — I70213 Atherosclerosis of native arteries of extremities with intermittent claudication, bilateral legs: Secondary | ICD-10-CM | POA: Diagnosis not present

## 2019-07-25 DIAGNOSIS — Z7901 Long term (current) use of anticoagulants: Secondary | ICD-10-CM | POA: Diagnosis not present

## 2019-07-25 DIAGNOSIS — Z5181 Encounter for therapeutic drug level monitoring: Secondary | ICD-10-CM | POA: Diagnosis not present

## 2019-07-25 DIAGNOSIS — I639 Cerebral infarction, unspecified: Secondary | ICD-10-CM

## 2019-07-25 LAB — POCT INR: INR: 2.2 (ref 2.0–3.0)

## 2019-07-31 ENCOUNTER — Ambulatory Visit (INDEPENDENT_AMBULATORY_CARE_PROVIDER_SITE_OTHER): Payer: Medicare HMO | Admitting: *Deleted

## 2019-07-31 DIAGNOSIS — I69351 Hemiplegia and hemiparesis following cerebral infarction affecting right dominant side: Secondary | ICD-10-CM | POA: Diagnosis not present

## 2019-07-31 DIAGNOSIS — Z7901 Long term (current) use of anticoagulants: Secondary | ICD-10-CM | POA: Diagnosis not present

## 2019-07-31 DIAGNOSIS — I639 Cerebral infarction, unspecified: Secondary | ICD-10-CM

## 2019-07-31 DIAGNOSIS — I69320 Aphasia following cerebral infarction: Secondary | ICD-10-CM | POA: Diagnosis not present

## 2019-07-31 DIAGNOSIS — Z5181 Encounter for therapeutic drug level monitoring: Secondary | ICD-10-CM | POA: Diagnosis not present

## 2019-07-31 DIAGNOSIS — I480 Paroxysmal atrial fibrillation: Secondary | ICD-10-CM | POA: Diagnosis not present

## 2019-07-31 DIAGNOSIS — I70213 Atherosclerosis of native arteries of extremities with intermittent claudication, bilateral legs: Secondary | ICD-10-CM | POA: Diagnosis not present

## 2019-07-31 LAB — POCT INR: INR: 1.8 — AB (ref 2.0–3.0)

## 2019-07-31 NOTE — Patient Instructions (Signed)
Take 1 1/2 tablets tonight then increase dose to 1 tablet daily except 1/2 tablet on Tuesdays.  Repeat INR in 1 week.  Orders given to [facility] with [facility] while at home with patient

## 2019-08-08 ENCOUNTER — Ambulatory Visit (INDEPENDENT_AMBULATORY_CARE_PROVIDER_SITE_OTHER): Payer: Medicare HMO | Admitting: *Deleted

## 2019-08-08 DIAGNOSIS — Z5181 Encounter for therapeutic drug level monitoring: Secondary | ICD-10-CM

## 2019-08-08 DIAGNOSIS — I480 Paroxysmal atrial fibrillation: Secondary | ICD-10-CM

## 2019-08-08 DIAGNOSIS — I69320 Aphasia following cerebral infarction: Secondary | ICD-10-CM | POA: Diagnosis not present

## 2019-08-08 DIAGNOSIS — Z7901 Long term (current) use of anticoagulants: Secondary | ICD-10-CM | POA: Diagnosis not present

## 2019-08-08 DIAGNOSIS — I639 Cerebral infarction, unspecified: Secondary | ICD-10-CM

## 2019-08-08 DIAGNOSIS — I69351 Hemiplegia and hemiparesis following cerebral infarction affecting right dominant side: Secondary | ICD-10-CM | POA: Diagnosis not present

## 2019-08-08 DIAGNOSIS — I70213 Atherosclerosis of native arteries of extremities with intermittent claudication, bilateral legs: Secondary | ICD-10-CM | POA: Diagnosis not present

## 2019-08-08 LAB — POCT INR: INR: 1.1 — AB (ref 2.0–3.0)

## 2019-08-08 NOTE — Patient Instructions (Signed)
Description   Spoke with Vallery Ridge with [facility] and instructed pt to take 1 1/2 tablets tonight and tomorrow then increase dose to 1 tablet daily.  Repeat INR in 1 week.  Orders given to [facility] with [facility] while at home with patient

## 2019-08-15 ENCOUNTER — Encounter: Payer: Self-pay | Admitting: Student

## 2019-08-15 ENCOUNTER — Ambulatory Visit (INDEPENDENT_AMBULATORY_CARE_PROVIDER_SITE_OTHER): Payer: Medicare HMO | Admitting: *Deleted

## 2019-08-15 ENCOUNTER — Other Ambulatory Visit: Payer: Self-pay

## 2019-08-15 ENCOUNTER — Telehealth (INDEPENDENT_AMBULATORY_CARE_PROVIDER_SITE_OTHER): Payer: Medicare HMO | Admitting: Student

## 2019-08-15 VITALS — BP 108/58 | Temp 98.3°F | Ht 76.0 in | Wt 175.0 lb

## 2019-08-15 DIAGNOSIS — I70213 Atherosclerosis of native arteries of extremities with intermittent claudication, bilateral legs: Secondary | ICD-10-CM | POA: Diagnosis not present

## 2019-08-15 DIAGNOSIS — Z8673 Personal history of transient ischemic attack (TIA), and cerebral infarction without residual deficits: Secondary | ICD-10-CM

## 2019-08-15 DIAGNOSIS — I639 Cerebral infarction, unspecified: Secondary | ICD-10-CM | POA: Diagnosis not present

## 2019-08-15 DIAGNOSIS — Z5181 Encounter for therapeutic drug level monitoring: Secondary | ICD-10-CM | POA: Diagnosis not present

## 2019-08-15 DIAGNOSIS — E785 Hyperlipidemia, unspecified: Secondary | ICD-10-CM

## 2019-08-15 DIAGNOSIS — I480 Paroxysmal atrial fibrillation: Secondary | ICD-10-CM | POA: Diagnosis not present

## 2019-08-15 DIAGNOSIS — I739 Peripheral vascular disease, unspecified: Secondary | ICD-10-CM

## 2019-08-15 DIAGNOSIS — Z7901 Long term (current) use of anticoagulants: Secondary | ICD-10-CM | POA: Diagnosis not present

## 2019-08-15 DIAGNOSIS — I69320 Aphasia following cerebral infarction: Secondary | ICD-10-CM | POA: Diagnosis not present

## 2019-08-15 DIAGNOSIS — I69351 Hemiplegia and hemiparesis following cerebral infarction affecting right dominant side: Secondary | ICD-10-CM | POA: Diagnosis not present

## 2019-08-15 LAB — POCT INR: INR: 1.8 — AB (ref 2.0–3.0)

## 2019-08-15 NOTE — Patient Instructions (Signed)
Spoke with Vallery Ridge with [facility] and instructed pt to take 1 1/2 tablets tonight then resume 1 tablet daily.  Repeat INR in 1 week.  Orders given to [facility] with [facility] while at home with patient

## 2019-08-15 NOTE — Patient Instructions (Signed)
Medication Instructions:  Your physician recommends that you continue on your current medications as directed. Please refer to the Current Medication list given to you today.  If you need a refill on your cardiac medications before your next appointment, please call your pharmacy.   Lab work: NONE  If you have labs (blood work) drawn today and your tests are completely normal, you will receive your results only by: Marland Kitchen MyChart Message (if you have MyChart) OR . A paper copy in the mail If you have any lab test that is abnormal or we need to change your treatment, we will call you to review the results.  Testing/Procedures: NONE   Follow-Up: At [facility], you and your health needs are our priority.  As part of our continuing mission to provide you with exceptional heart care, we have created designated Provider Care Teams.  These Care Teams include your primary Cardiologist (physician) and Advanced Practice Providers (APPs -  Physician Assistants and Nurse Practitioners) who all work together to provide you with the care you need, when you need it. You will need a follow up appointment in 6 months.  Please call our office 2 months in advance to schedule this appointment.  You may see Minus Breeding, MD or one of the following Advanced Practice Providers on your designated Care Team:   Bernerd Pho, PA-C [facility]) . Ermalinda Barrios, PA-C ([place])  Any Other Special Instructions Will Be Listed Below (If Applicable). Thank you for choosing [facility]!

## 2019-08-15 NOTE — Progress Notes (Signed)
Virtual Visit via Telephone Note   This visit type was conducted due to national recommendations for restrictions regarding the COVID-19 Pandemic (e.g. social distancing) in an effort to limit this patient's exposure and mitigate transmission in our community.  Due to his co-morbid illnesses, this patient is at least at moderate risk for complications without adequate follow up.  This format is felt to be most appropriate for this patient at this time.  The patient did not have access to video technology/had technical difficulties with video requiring transitioning to audio format only (telephone).  All issues noted in this document were discussed and addressed.  No physical exam could be performed with this format.  Please refer to the patient's chart for his  consent to telehealth for [facility].   Date:  08/15/2019   ID:  [name] CreekRichard P Philbin, DOB 02/14/1951, MRN [id number]  Patient Location: Home Provider Location: Office  PCP:  Toma DeitersHasanaj, Xaje A, MD  Cardiologist:  Rollene RotundaJames Hochrein, MD  Electrophysiologist:  None   Evaluation Performed:  Follow-Up Visit  Chief Complaint:  Paraesthesias along Lower Extremities  History of Present Illness:    [name] [name] is a 68 y.o. male with past medical history of paroxysmal atrial fibrillation, PVD (s/p bilateral fasciotomy and embolectomy in 08/2017, aortobifemoral bypass with bilateral common femoral endarterectomy, profundoplasty, and bilateral iliac thrombectomy in 01/2019), HLD, and prior CVA who presents for a 6865-month follow-up telehealth visit.  He most recently had a phone visit with Dr. Antoine PocheHochrein in 03/2019 and was overall doing well from a cardiac perspective at that time with no recent chest pain or dyspnea on exertion.  His recent admission in 01/2019 had been complicated by postoperative atrial fibrillation, postoperative ileus, and an acute kidney injury. Rates were overall well controlled at that time and he was continued on  Metoprolol 25mg  BID along with Coumadin.  In talking with the patient and his sister Elaina Hoops(Dreama) today, he reports overall doing well from a cardiac perspective and denies any recent chest pain or dyspnea on exertion. No recent palpitations, orthopnea, PND, or lower extremity edema.  He does experience a tingling sensation along his feet bilaterally which can occur at rest or with activity. He is unsure if this is exacerbated with exertion. He has scheduled follow-up with Vascular Surgery next week for repeat evaluation. Reports his symptoms feel different from his prior claudication. No reported open wounds or edema.   He reports good compliance with his current medication regimen, including Coumadin. Denies any melena, hematochezia or hematuria. Home Health is still coming once per week and he reports HR and BP have overall been well-controlled when checked during the visits.   The patient does not have symptoms concerning for COVID-19 infection (fever, chills, cough, or new shortness of breath).    Past Medical History:  Diagnosis Date  . Afib (HCC)    a. Paroxysmal --> previous issues with bradycardia while on Amiodarone and he refused PPM placement. Rate-control strategy pursued.   . Aortic stenosis   . Chronic kidney disease    unknown problem to [place] kidney  [place]  . Dysrhythmia    Atril Fib  . Peripheral vascular disease (HCC)   . Stroke [facility](HCC)    Aphasia, right sided weakness    Past Surgical History:  Procedure Laterality Date  . AORTA - BILATERAL FEMORAL ARTERY BYPASS GRAFT Bilateral 01/28/2019   Procedure: AORTA BIFEMORAL BYPASS GRAFT;  Surgeon: Sherren KernsFields, [name], MD;  Location: [facility] OR;  Service: Vascular;  Laterality: Bilateral;  . AORTA BIFEMORAL BYPASS GRAFT  01/28/2019  . DIALYSIS/PERMA CATHETER INSERTION N/A 02/15/2019   Procedure: DIALYSIS/PERMA CATHETER INSERTION;  Surgeon: Elam Dutch, MD;  Location: [place] CV LAB;  Service: Cardiovascular;   Laterality: N/A;  . EMBOLECTOMY Right 08/30/2017   Procedure: Right popliteal tibial endarectomy with patch angioplasty;  Surgeon: Elam Dutch, MD;  Location: [place];  Service: Vascular;  Laterality: Right;  . ENDARTERECTOMY FEMORAL Bilateral 01/28/2019   Procedure: BILATERAL FEMORAL ENDARTERECTOMY WITH PROFUNDAPLASTY;  Surgeon: Elam Dutch, MD;  Location: [place];  Service: Vascular;  Laterality: Bilateral;  . FASCIOTOMY CLOSURE Bilateral 08/28/2017   Procedure: FASCIOTOMY CLOSURE/BILATERAL  lower legs;  Surgeon: Elam Dutch, MD;  Location: [place];  Service: Vascular;  Laterality: Bilateral;  . FEMORAL-POPLITEAL BYPASS GRAFT Bilateral 08/26/2017   Procedure: Bilateral FEMORAL EMBOLECTOMY with  BILATERAL Four Compartment  FASCIOTOMIES.;  Surgeon: Elam Dutch, MD;  Location: [facility] OR;  Service: Vascular;  Laterality: Bilateral;  . FEMORAL-POPLITEAL BYPASS GRAFT Bilateral 01/28/2019   Procedure: BILATERAL  ILIAC THROMBECTOMY;  Surgeon: Elam Dutch, MD;  Location: [facility] OR;  Service: Vascular;  Laterality: Bilateral;     Current Meds  Medication Sig  . aspirin EC 81 MG tablet Take 1 tablet (81 mg total) by mouth daily.  Marland Kitchen atorvastatin (LIPITOR) 20 MG tablet Take 1 tablet (20 mg total) by mouth daily.  . metoprolol tartrate (LOPRESSOR) 25 MG tablet Take 1 tablet (25 mg total) by mouth 2 (two) times daily.  Marland Kitchen warfarin (COUMADIN) 5 MG tablet Take 1 to 1.5 tablets by mouth daily as directed     Allergies:   Patient has no known allergies.   Social History   Tobacco Use  . Smoking status: Never Smoker  . Smokeless tobacco: Never Used  Substance Use Topics  . Alcohol use: No  . Drug use: No     Family Hx: The patient's family history includes Stroke in his sister.  ROS:   Please see the history of present illness.     All other systems reviewed and are negative.   Prior CV studies:   The following studies were reviewed today:  Echocardiogram: 01/2019 IMPRESSIONS     1. The left ventricle has normal systolic function with an ejection fraction of 60-65%. The cavity size was normal. There is moderately increased left ventricular wall thickness. Left ventricular diastology could not be evaluated secondary to atrial  fibrillation.  2. The right ventricle has normal systolic function. The cavity was normal. There is no increase in right ventricular wall thickness.  3. The mitral valve is normal in structure.  4. The tricuspid valve is normal in structure.  5. Probably trileaflet Mild thickening of the aortic valve Moderate calcification of the aortic valve. no stenosis of the aortic valve.  6. The pulmonic valve was normal in structure.  7. 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  8. 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  9. Right atrial pressure is estimated at 3 mmHg.  Labs/Other Tests and Data Reviewed:    EKG:  No ECG reviewed.  Recent Labs: 02/25/2019: Magnesium 1.8 02/26/2019: ALT 29; BUN 18; Creatinine, Ser 1.31; Potassium 4.0; Sodium 134 02/28/2019: Hemoglobin 10.9; Platelets 250   Recent Lipid Panel Lab Results  Component Value Date/Time   CHOL 149 08/23/2017 04:25 AM   TRIG 44 02/25/2019 04:37 AM   HDL 51 08/23/2017 04:25 AM  CHOLHDL 2.9 08/23/2017 04:25 AM   LDLCALC 78 08/23/2017 04:25 AM    Wt Readings from Last 3 Encounters:  08/15/19 175 lb (79.4 kg)  04/17/19 165 lb (74.8 kg)  02/22/19 177 lb 4 oz (80.4 kg)     Objective:    Vital Signs:  BP (!) 108/58   Temp 98.3 F (36.8 C)   Ht 6\' 4"  (1.93 m)   Wt 175 lb (79.4 kg)   SpO2 94%   BMI 21.30 kg/m    General: Pleasant male sounding in NAD. Has known expressive aphasia.  Psych: Normal affect. Neuro: Alert and oriented X 3.  Lungs:  Resp regular and unlabored while talking on the phone.   ASSESSMENT & PLAN:    1. Paroxysmal Atrial Fibrillation - He denies any recent palpitations and heart rate has been well controlled  when checked at home. Continue Lopressor 25 mg twice daily for rate control. - He denies any evidence of active bleeding. Remains on Coumadin for anticoagulation. He was previously interested in a NOAC but Eliquis was over $500 per month. From a cardiac perspective, a NOAC would be reasonable in regards to his atrial fibrillation if okay from a Vascular perspective. His sister is interested in seeing if patient assistance is available and will ask nursing staff to assist with patient assistance paperwork once confirmed Vascular is fine with switching from Coumadin to NOAC.   2. PAD - he is s/p bilateral fasciotomy and embolectomy in 08/2017 with aortobifemoral bypass with bilateral common femoral endarterectomy, profundoplasty, and bilateral iliac thrombectomy in 01/2019. - He denies any specific claudication or open wounds but does report a tingling sensation along his feet bilaterally. He has scheduled follow-up with Vascular next Monday but symptoms sound most consistent with neuropathy. Will await results from formal evaluation.  3. HLD - followed by PCP. No recent FLP available for review in Epic. Remains on Atorvastatin 20mg  daily.   4. History of CVA - he does have a residual expressive aphasia.  Remains on ASA and statin therapy.  COVID-19 Education: The signs and symptoms of COVID-19 were discussed with the patient and how to seek care for testing (follow up with PCP or arrange E-visit).  The importance of social distancing was discussed today.  Time:   Today, I have spent 17 minutes with the patient with telehealth technology discussing the above problems.     Medication Adjustments/Labs and Tests Ordered: Current medicines are reviewed at length with the patient today.  Concerns regarding medicines are outlined above.   Tests Ordered: No orders of the defined types were placed in this encounter.   Medication Changes: No orders of the defined types were placed in this encounter.    Follow Up:  In Person in 6 month(s)  Signed, Ellsworth Lennox, PA-C  08/15/2019 5:12 PM    [facility]\

## 2019-08-19 ENCOUNTER — Ambulatory Visit: Payer: Medicare HMO | Admitting: Family

## 2019-08-22 ENCOUNTER — Ambulatory Visit (INDEPENDENT_AMBULATORY_CARE_PROVIDER_SITE_OTHER): Payer: Medicare HMO | Admitting: Pharmacist

## 2019-08-22 ENCOUNTER — Telehealth: Payer: Self-pay | Admitting: Cardiology

## 2019-08-22 DIAGNOSIS — I639 Cerebral infarction, unspecified: Secondary | ICD-10-CM

## 2019-08-22 DIAGNOSIS — I69320 Aphasia following cerebral infarction: Secondary | ICD-10-CM | POA: Diagnosis not present

## 2019-08-22 DIAGNOSIS — Z5181 Encounter for therapeutic drug level monitoring: Secondary | ICD-10-CM | POA: Diagnosis not present

## 2019-08-22 DIAGNOSIS — I70213 Atherosclerosis of native arteries of extremities with intermittent claudication, bilateral legs: Secondary | ICD-10-CM | POA: Diagnosis not present

## 2019-08-22 DIAGNOSIS — I480 Paroxysmal atrial fibrillation: Secondary | ICD-10-CM

## 2019-08-22 DIAGNOSIS — Z7901 Long term (current) use of anticoagulants: Secondary | ICD-10-CM | POA: Diagnosis not present

## 2019-08-22 DIAGNOSIS — I69351 Hemiplegia and hemiparesis following cerebral infarction affecting right dominant side: Secondary | ICD-10-CM | POA: Diagnosis not present

## 2019-08-22 LAB — POCT INR: INR: 2 (ref 2.0–3.0)

## 2019-08-22 NOTE — Telephone Encounter (Signed)
INR: 2.0

## 2019-08-22 NOTE — Telephone Encounter (Signed)
See anticoagulation encounter from 9/3

## 2019-08-27 ENCOUNTER — Ambulatory Visit (INDEPENDENT_AMBULATORY_CARE_PROVIDER_SITE_OTHER): Payer: Medicare HMO | Admitting: Cardiology

## 2019-08-27 DIAGNOSIS — I70213 Atherosclerosis of native arteries of extremities with intermittent claudication, bilateral legs: Secondary | ICD-10-CM | POA: Diagnosis not present

## 2019-08-27 DIAGNOSIS — Z5181 Encounter for therapeutic drug level monitoring: Secondary | ICD-10-CM

## 2019-08-27 DIAGNOSIS — I69320 Aphasia following cerebral infarction: Secondary | ICD-10-CM | POA: Diagnosis not present

## 2019-08-27 DIAGNOSIS — I480 Paroxysmal atrial fibrillation: Secondary | ICD-10-CM

## 2019-08-27 DIAGNOSIS — I639 Cerebral infarction, unspecified: Secondary | ICD-10-CM | POA: Diagnosis not present

## 2019-08-27 DIAGNOSIS — Z7901 Long term (current) use of anticoagulants: Secondary | ICD-10-CM | POA: Diagnosis not present

## 2019-08-27 DIAGNOSIS — I69351 Hemiplegia and hemiparesis following cerebral infarction affecting right dominant side: Secondary | ICD-10-CM | POA: Diagnosis not present

## 2019-08-27 LAB — POCT INR: INR: 2.9 (ref 2.0–3.0)

## 2019-08-27 NOTE — Patient Instructions (Signed)
Description   Spoke with Genia Hotter with [facility] [phone] and advised pt to continue taking 1.5 on  taking 1 tablet daily except 1.5 tablets on Tuesday. Repeat INR in 9/17.  Orders given to [facility] RN [facility]. She will call daughter to make aware of dosing.

## 2019-09-05 ENCOUNTER — Ambulatory Visit (INDEPENDENT_AMBULATORY_CARE_PROVIDER_SITE_OTHER): Payer: Medicare HMO | Admitting: *Deleted

## 2019-09-05 DIAGNOSIS — Z5181 Encounter for therapeutic drug level monitoring: Secondary | ICD-10-CM

## 2019-09-05 DIAGNOSIS — I69351 Hemiplegia and hemiparesis following cerebral infarction affecting right dominant side: Secondary | ICD-10-CM | POA: Diagnosis not present

## 2019-09-05 DIAGNOSIS — I70213 Atherosclerosis of native arteries of extremities with intermittent claudication, bilateral legs: Secondary | ICD-10-CM | POA: Diagnosis not present

## 2019-09-05 DIAGNOSIS — I639 Cerebral infarction, unspecified: Secondary | ICD-10-CM | POA: Diagnosis not present

## 2019-09-05 DIAGNOSIS — I480 Paroxysmal atrial fibrillation: Secondary | ICD-10-CM | POA: Diagnosis not present

## 2019-09-05 DIAGNOSIS — Z7901 Long term (current) use of anticoagulants: Secondary | ICD-10-CM | POA: Diagnosis not present

## 2019-09-05 DIAGNOSIS — I69320 Aphasia following cerebral infarction: Secondary | ICD-10-CM | POA: Diagnosis not present

## 2019-09-05 LAB — POCT INR: INR: 2.8 (ref 2.0–3.0)

## 2019-09-05 NOTE — Patient Instructions (Signed)
Continue coumadin 1 tablet daily except 1.5 tablets on Tuesday. Repeat in 1 week.  Orders given to [facility] RN [facility].

## 2019-09-12 ENCOUNTER — Ambulatory Visit (INDEPENDENT_AMBULATORY_CARE_PROVIDER_SITE_OTHER): Payer: Medicare HMO | Admitting: Pharmacist

## 2019-09-12 ENCOUNTER — Telehealth: Payer: Self-pay | Admitting: *Deleted

## 2019-09-12 DIAGNOSIS — Z7901 Long term (current) use of anticoagulants: Secondary | ICD-10-CM | POA: Diagnosis not present

## 2019-09-12 DIAGNOSIS — I480 Paroxysmal atrial fibrillation: Secondary | ICD-10-CM

## 2019-09-12 DIAGNOSIS — I639 Cerebral infarction, unspecified: Secondary | ICD-10-CM | POA: Diagnosis not present

## 2019-09-12 DIAGNOSIS — I69351 Hemiplegia and hemiparesis following cerebral infarction affecting right dominant side: Secondary | ICD-10-CM | POA: Diagnosis not present

## 2019-09-12 DIAGNOSIS — I69320 Aphasia following cerebral infarction: Secondary | ICD-10-CM | POA: Diagnosis not present

## 2019-09-12 DIAGNOSIS — Z5181 Encounter for therapeutic drug level monitoring: Secondary | ICD-10-CM

## 2019-09-12 DIAGNOSIS — I70213 Atherosclerosis of native arteries of extremities with intermittent claudication, bilateral legs: Secondary | ICD-10-CM | POA: Diagnosis not present

## 2019-09-12 LAB — POCT INR: INR: 2.8 (ref 2.0–3.0)

## 2019-09-12 NOTE — Telephone Encounter (Signed)
PT 2.8 please call Tracie @ [facility] [phone]

## 2019-09-12 NOTE — Telephone Encounter (Signed)
See anticoag encounter from 09/12/2019 

## 2019-09-26 ENCOUNTER — Ambulatory Visit (INDEPENDENT_AMBULATORY_CARE_PROVIDER_SITE_OTHER): Payer: Medicare HMO | Admitting: *Deleted

## 2019-09-26 DIAGNOSIS — I70213 Atherosclerosis of native arteries of extremities with intermittent claudication, bilateral legs: Secondary | ICD-10-CM | POA: Diagnosis not present

## 2019-09-26 DIAGNOSIS — Z5181 Encounter for therapeutic drug level monitoring: Secondary | ICD-10-CM | POA: Diagnosis not present

## 2019-09-26 DIAGNOSIS — I69351 Hemiplegia and hemiparesis following cerebral infarction affecting right dominant side: Secondary | ICD-10-CM | POA: Diagnosis not present

## 2019-09-26 DIAGNOSIS — I639 Cerebral infarction, unspecified: Secondary | ICD-10-CM

## 2019-09-26 DIAGNOSIS — I69320 Aphasia following cerebral infarction: Secondary | ICD-10-CM | POA: Diagnosis not present

## 2019-09-26 DIAGNOSIS — Z7901 Long term (current) use of anticoagulants: Secondary | ICD-10-CM | POA: Diagnosis not present

## 2019-09-26 DIAGNOSIS — I480 Paroxysmal atrial fibrillation: Secondary | ICD-10-CM | POA: Diagnosis not present

## 2019-09-26 LAB — POCT INR: INR: 3.2 — AB (ref 2.0–3.0)

## 2019-09-26 NOTE — Patient Instructions (Signed)
Take coumadin 1/2 tablet tonight then resume 1 tablet daily except 1.5 tablets on Tuesday. Repeat in 1 week.  Orders given to [facility] RN [facility].

## 2019-10-03 ENCOUNTER — Ambulatory Visit (INDEPENDENT_AMBULATORY_CARE_PROVIDER_SITE_OTHER): Payer: Medicare HMO | Admitting: Cardiology

## 2019-10-03 DIAGNOSIS — I70213 Atherosclerosis of native arteries of extremities with intermittent claudication, bilateral legs: Secondary | ICD-10-CM | POA: Diagnosis not present

## 2019-10-03 DIAGNOSIS — I639 Cerebral infarction, unspecified: Secondary | ICD-10-CM

## 2019-10-03 DIAGNOSIS — I69320 Aphasia following cerebral infarction: Secondary | ICD-10-CM | POA: Diagnosis not present

## 2019-10-03 DIAGNOSIS — Z7901 Long term (current) use of anticoagulants: Secondary | ICD-10-CM | POA: Diagnosis not present

## 2019-10-03 DIAGNOSIS — Z5181 Encounter for therapeutic drug level monitoring: Secondary | ICD-10-CM

## 2019-10-03 DIAGNOSIS — I480 Paroxysmal atrial fibrillation: Secondary | ICD-10-CM

## 2019-10-03 DIAGNOSIS — I69351 Hemiplegia and hemiparesis following cerebral infarction affecting right dominant side: Secondary | ICD-10-CM | POA: Diagnosis not present

## 2019-10-03 LAB — POCT INR: INR: 4.3 — AB (ref 2.0–3.0)

## 2019-10-10 ENCOUNTER — Ambulatory Visit (INDEPENDENT_AMBULATORY_CARE_PROVIDER_SITE_OTHER): Payer: Medicare HMO | Admitting: *Deleted

## 2019-10-10 ENCOUNTER — Telehealth: Payer: Self-pay | Admitting: *Deleted

## 2019-10-10 DIAGNOSIS — Z5181 Encounter for therapeutic drug level monitoring: Secondary | ICD-10-CM

## 2019-10-10 DIAGNOSIS — I69320 Aphasia following cerebral infarction: Secondary | ICD-10-CM | POA: Diagnosis not present

## 2019-10-10 DIAGNOSIS — I69351 Hemiplegia and hemiparesis following cerebral infarction affecting right dominant side: Secondary | ICD-10-CM | POA: Diagnosis not present

## 2019-10-10 DIAGNOSIS — Z7901 Long term (current) use of anticoagulants: Secondary | ICD-10-CM | POA: Diagnosis not present

## 2019-10-10 DIAGNOSIS — I480 Paroxysmal atrial fibrillation: Secondary | ICD-10-CM | POA: Diagnosis not present

## 2019-10-10 DIAGNOSIS — I70213 Atherosclerosis of native arteries of extremities with intermittent claudication, bilateral legs: Secondary | ICD-10-CM | POA: Diagnosis not present

## 2019-10-10 DIAGNOSIS — I639 Cerebral infarction, unspecified: Secondary | ICD-10-CM | POA: Diagnosis not present

## 2019-10-10 LAB — POCT INR: INR: 4.1 — AB (ref 2.0–3.0)

## 2019-10-10 NOTE — Patient Instructions (Signed)
Hold warfarin tonight then decrease dose to 1 tablet daily except 1/2 tablet on Tuesdays and Fridays.  Repeat INR in 1 week  Orders given to [employer].

## 2019-10-10 NOTE — Telephone Encounter (Signed)
Done.  See coumadin note. 

## 2019-10-10 NOTE — Telephone Encounter (Signed)
Per Liz-patient has not eaten any greens but when he was eating the greens, his INR's were within range.

## 2019-10-17 ENCOUNTER — Telehealth: Payer: Self-pay | Admitting: *Deleted

## 2019-10-17 ENCOUNTER — Ambulatory Visit (INDEPENDENT_AMBULATORY_CARE_PROVIDER_SITE_OTHER): Payer: Medicare HMO | Admitting: *Deleted

## 2019-10-17 DIAGNOSIS — Z5181 Encounter for therapeutic drug level monitoring: Secondary | ICD-10-CM

## 2019-10-17 DIAGNOSIS — I639 Cerebral infarction, unspecified: Secondary | ICD-10-CM | POA: Diagnosis not present

## 2019-10-17 DIAGNOSIS — I70213 Atherosclerosis of native arteries of extremities with intermittent claudication, bilateral legs: Secondary | ICD-10-CM | POA: Diagnosis not present

## 2019-10-17 DIAGNOSIS — I480 Paroxysmal atrial fibrillation: Secondary | ICD-10-CM | POA: Diagnosis not present

## 2019-10-17 DIAGNOSIS — I69351 Hemiplegia and hemiparesis following cerebral infarction affecting right dominant side: Secondary | ICD-10-CM | POA: Diagnosis not present

## 2019-10-17 DIAGNOSIS — Z7901 Long term (current) use of anticoagulants: Secondary | ICD-10-CM | POA: Diagnosis not present

## 2019-10-17 DIAGNOSIS — I69320 Aphasia following cerebral infarction: Secondary | ICD-10-CM | POA: Diagnosis not present

## 2019-10-17 LAB — POCT INR: INR: 1.6 — AB (ref 2.0–3.0)

## 2019-10-17 NOTE — Telephone Encounter (Signed)
Done.  See coumadin note. 

## 2019-10-17 NOTE — Patient Instructions (Signed)
Increase warfarin to 1 tablet daily. Continue greens 2 x week  Repeat INR in 1 week  Orders given to [employer].

## 2019-10-17 NOTE — Telephone Encounter (Signed)
[name] [name] with [facility]  [phone] called INR 1.6 states that he has been eating 2 servings of spinach weekly. Also, HR was 120

## 2019-10-24 ENCOUNTER — Ambulatory Visit (INDEPENDENT_AMBULATORY_CARE_PROVIDER_SITE_OTHER): Payer: Medicare HMO | Admitting: *Deleted

## 2019-10-24 DIAGNOSIS — I70213 Atherosclerosis of native arteries of extremities with intermittent claudication, bilateral legs: Secondary | ICD-10-CM | POA: Diagnosis not present

## 2019-10-24 DIAGNOSIS — I480 Paroxysmal atrial fibrillation: Secondary | ICD-10-CM | POA: Diagnosis not present

## 2019-10-24 DIAGNOSIS — I69351 Hemiplegia and hemiparesis following cerebral infarction affecting right dominant side: Secondary | ICD-10-CM | POA: Diagnosis not present

## 2019-10-24 DIAGNOSIS — Z5181 Encounter for therapeutic drug level monitoring: Secondary | ICD-10-CM

## 2019-10-24 DIAGNOSIS — I69320 Aphasia following cerebral infarction: Secondary | ICD-10-CM | POA: Diagnosis not present

## 2019-10-24 DIAGNOSIS — I639 Cerebral infarction, unspecified: Secondary | ICD-10-CM | POA: Diagnosis not present

## 2019-10-24 DIAGNOSIS — Z7901 Long term (current) use of anticoagulants: Secondary | ICD-10-CM | POA: Diagnosis not present

## 2019-10-24 LAB — POCT INR: INR: 1.7 — AB (ref 2.0–3.0)

## 2019-10-24 MED ORDER — WARFARIN SODIUM 5 MG PO TABS: ORAL_TABLET | ORAL | 3 refills | Status: DC

## 2019-10-24 NOTE — Patient Instructions (Signed)
Increase warfarin to 1 tablet daily except 1 1/2 tablets on Thursdays Continue greens 2 x week  Repeat INR on 11/05/19  Orders given to Kathlee Nations RN [facility].

## 2019-11-04 IMAGING — DX DG ABDOMEN 2V
2 series · 2 of 2 positions shown · non-contrast
Comparison: Abdomen and pelvis CT dated 02/08/2019.

CLINICAL DATA: Follow-up postoperative ileus.

EXAM:
ABDOMEN - 2 VIEW

[abdomen erect]
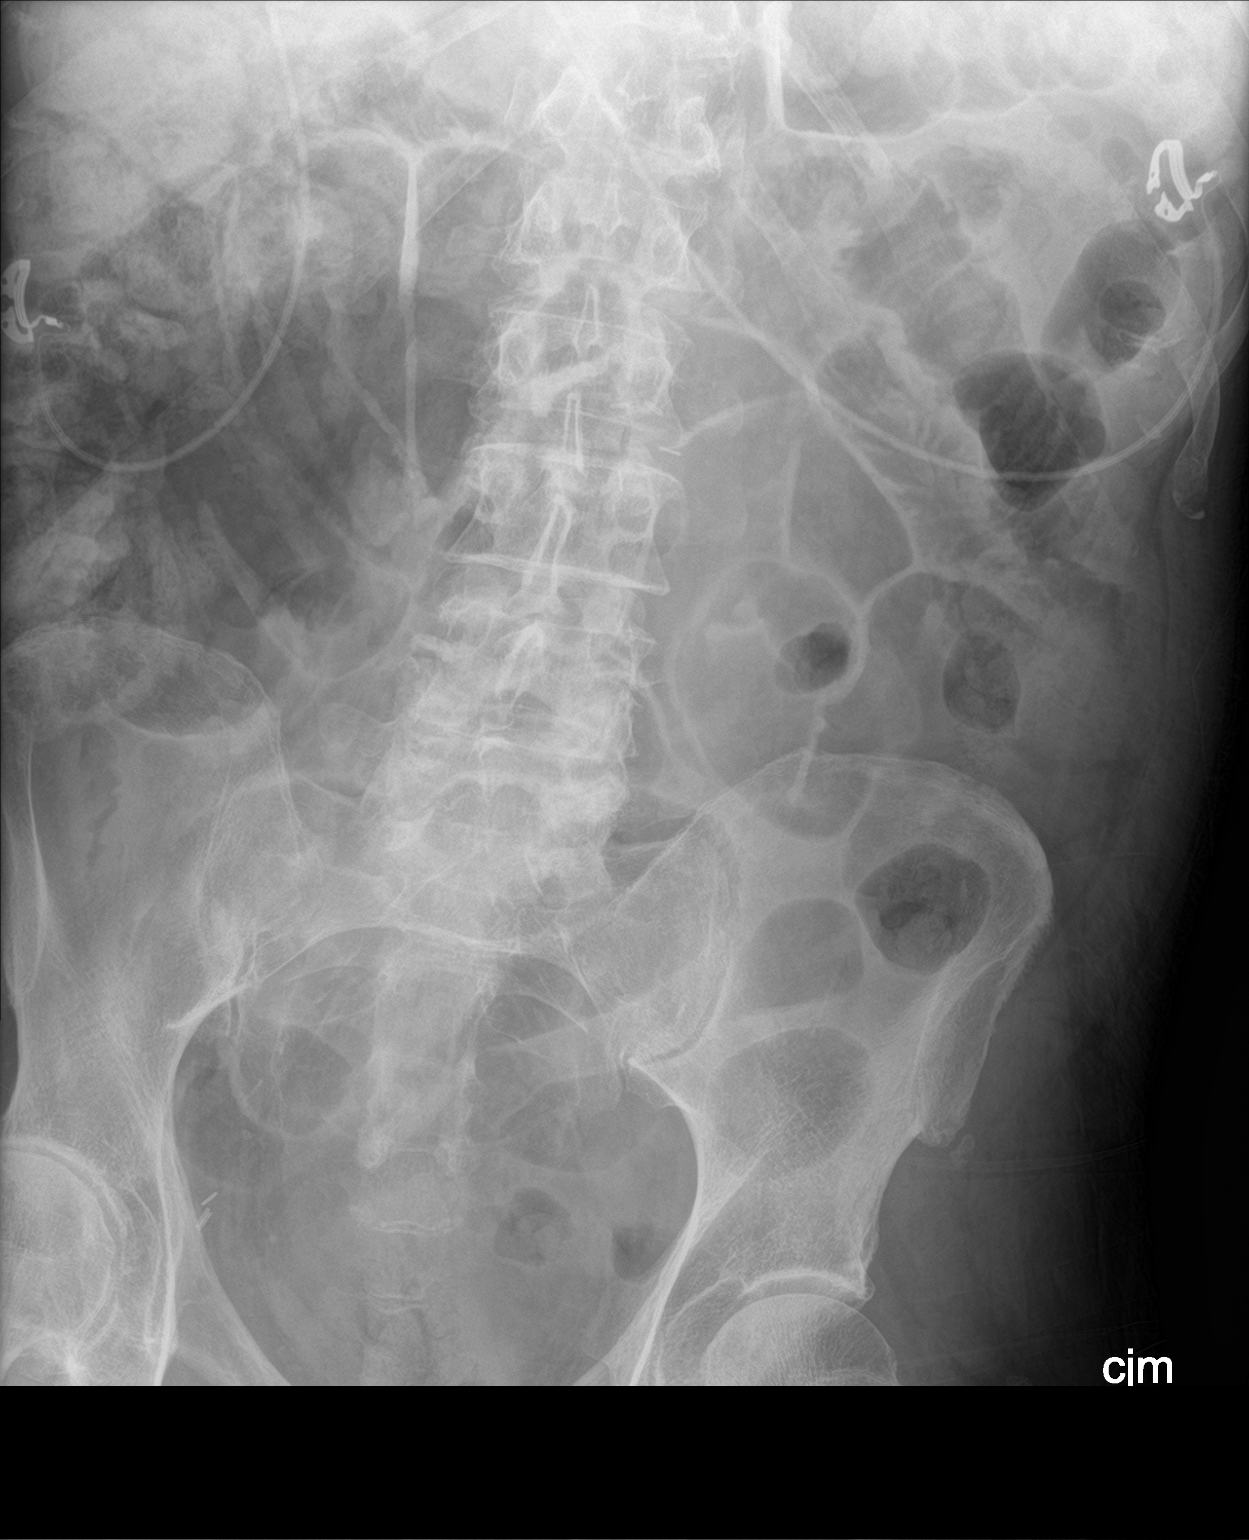

[abdomen supine]
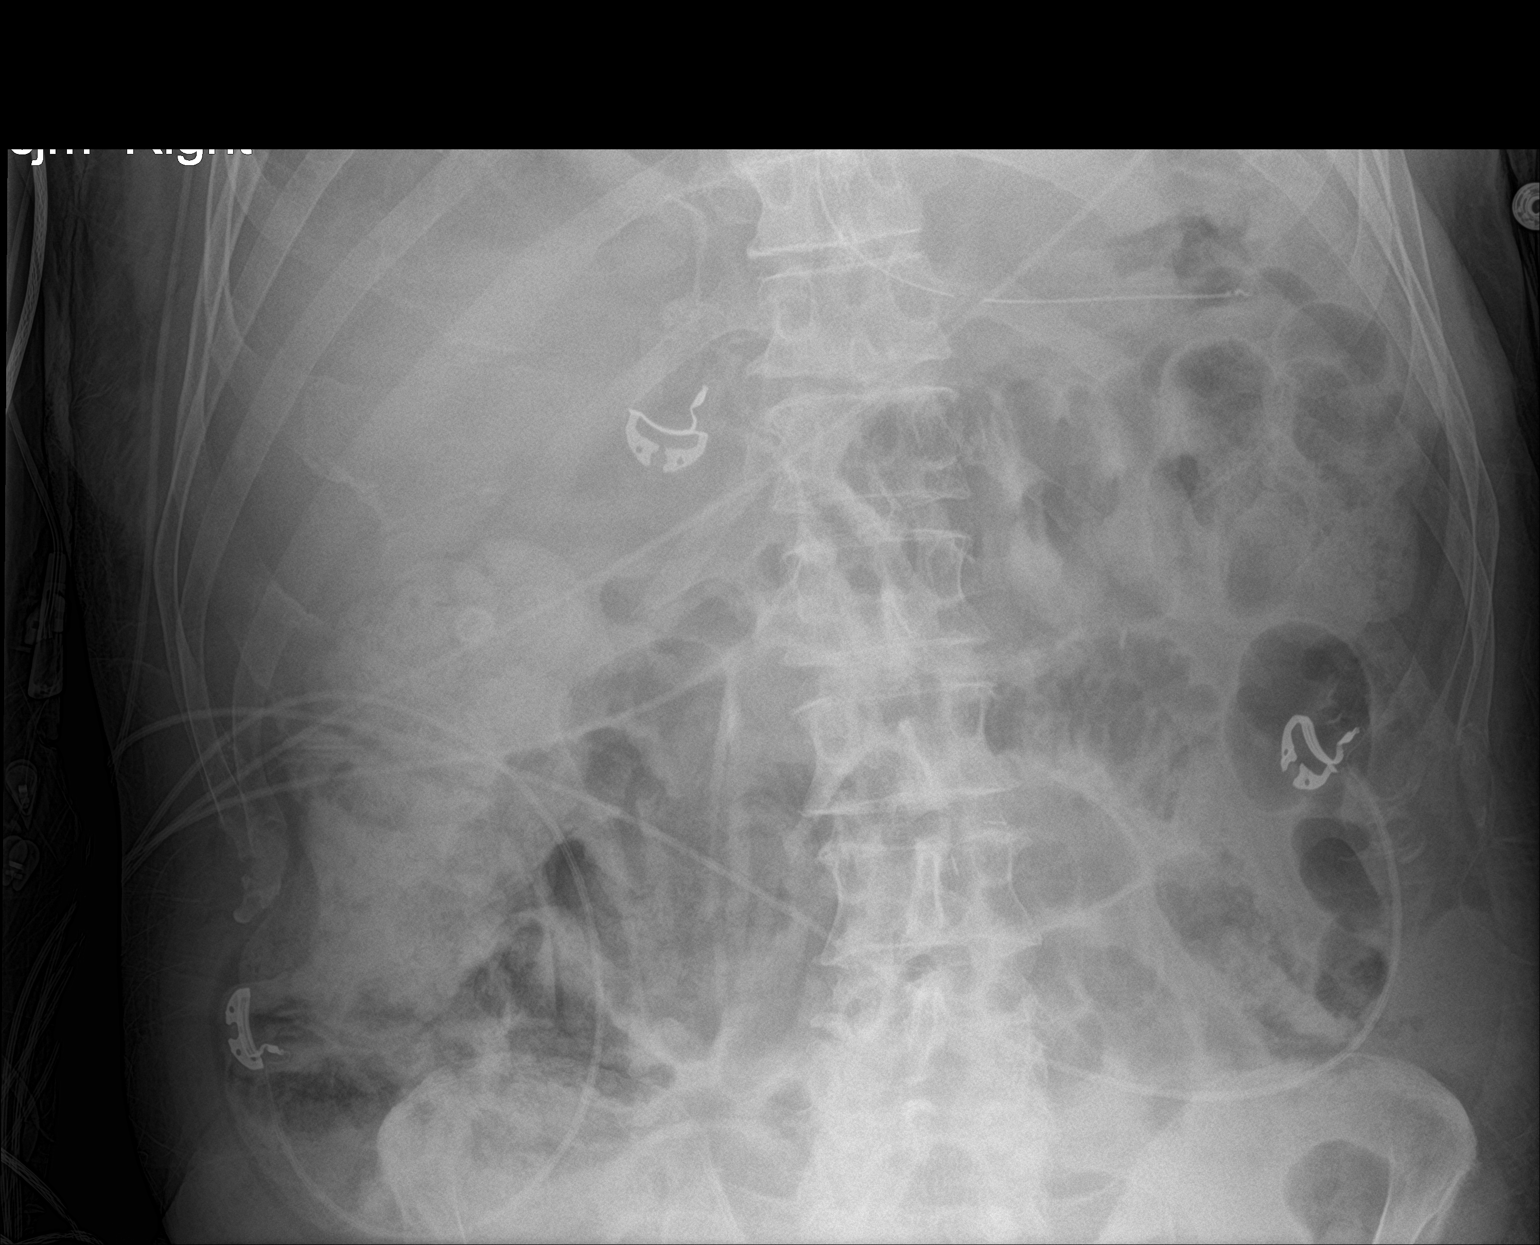

[2 of 2 positions shown; findings below may reference images not displayed]

FINDINGS: Decreased caliber of multiple gas-filled loops of small bowel. No
free peritoneal air. Some of the previously demonstrated stool in
the right colon has moved into the proximal transverse colon and the
cecum is extending more superiorly in the right mid abdomen and is
gas distended. Stable nasogastric tube with its tip and side hole in
the proximal stomach. Left basilar airspace opacity. Abdominal and
right pelvic surgical clips are noted. No acute bony abnormality.
IMPRESSION: 1. Improving ileus.
2. Left basilar atelectasis.

## 2019-11-05 ENCOUNTER — Ambulatory Visit (INDEPENDENT_AMBULATORY_CARE_PROVIDER_SITE_OTHER): Payer: Medicare HMO | Admitting: Pharmacist Clinician (PhC)/ Clinical Pharmacy Specialist

## 2019-11-05 DIAGNOSIS — I69351 Hemiplegia and hemiparesis following cerebral infarction affecting right dominant side: Secondary | ICD-10-CM | POA: Diagnosis not present

## 2019-11-05 DIAGNOSIS — I480 Paroxysmal atrial fibrillation: Secondary | ICD-10-CM | POA: Diagnosis not present

## 2019-11-05 DIAGNOSIS — Z5181 Encounter for therapeutic drug level monitoring: Secondary | ICD-10-CM

## 2019-11-05 DIAGNOSIS — I70213 Atherosclerosis of native arteries of extremities with intermittent claudication, bilateral legs: Secondary | ICD-10-CM | POA: Diagnosis not present

## 2019-11-05 DIAGNOSIS — I639 Cerebral infarction, unspecified: Secondary | ICD-10-CM

## 2019-11-05 DIAGNOSIS — Z7901 Long term (current) use of anticoagulants: Secondary | ICD-10-CM | POA: Diagnosis not present

## 2019-11-05 DIAGNOSIS — I69320 Aphasia following cerebral infarction: Secondary | ICD-10-CM | POA: Diagnosis not present

## 2019-11-05 LAB — POCT INR: INR: 1.7 — AB (ref 2.0–3.0)

## 2019-11-05 IMAGING — DX DG CHEST 1V
1 series · 1 of 1 positions shown · non-contrast
Comparison: 02/03/2019

CLINICAL DATA: Fever and chills.

EXAM:
CHEST  1 VIEW

[chest ap]
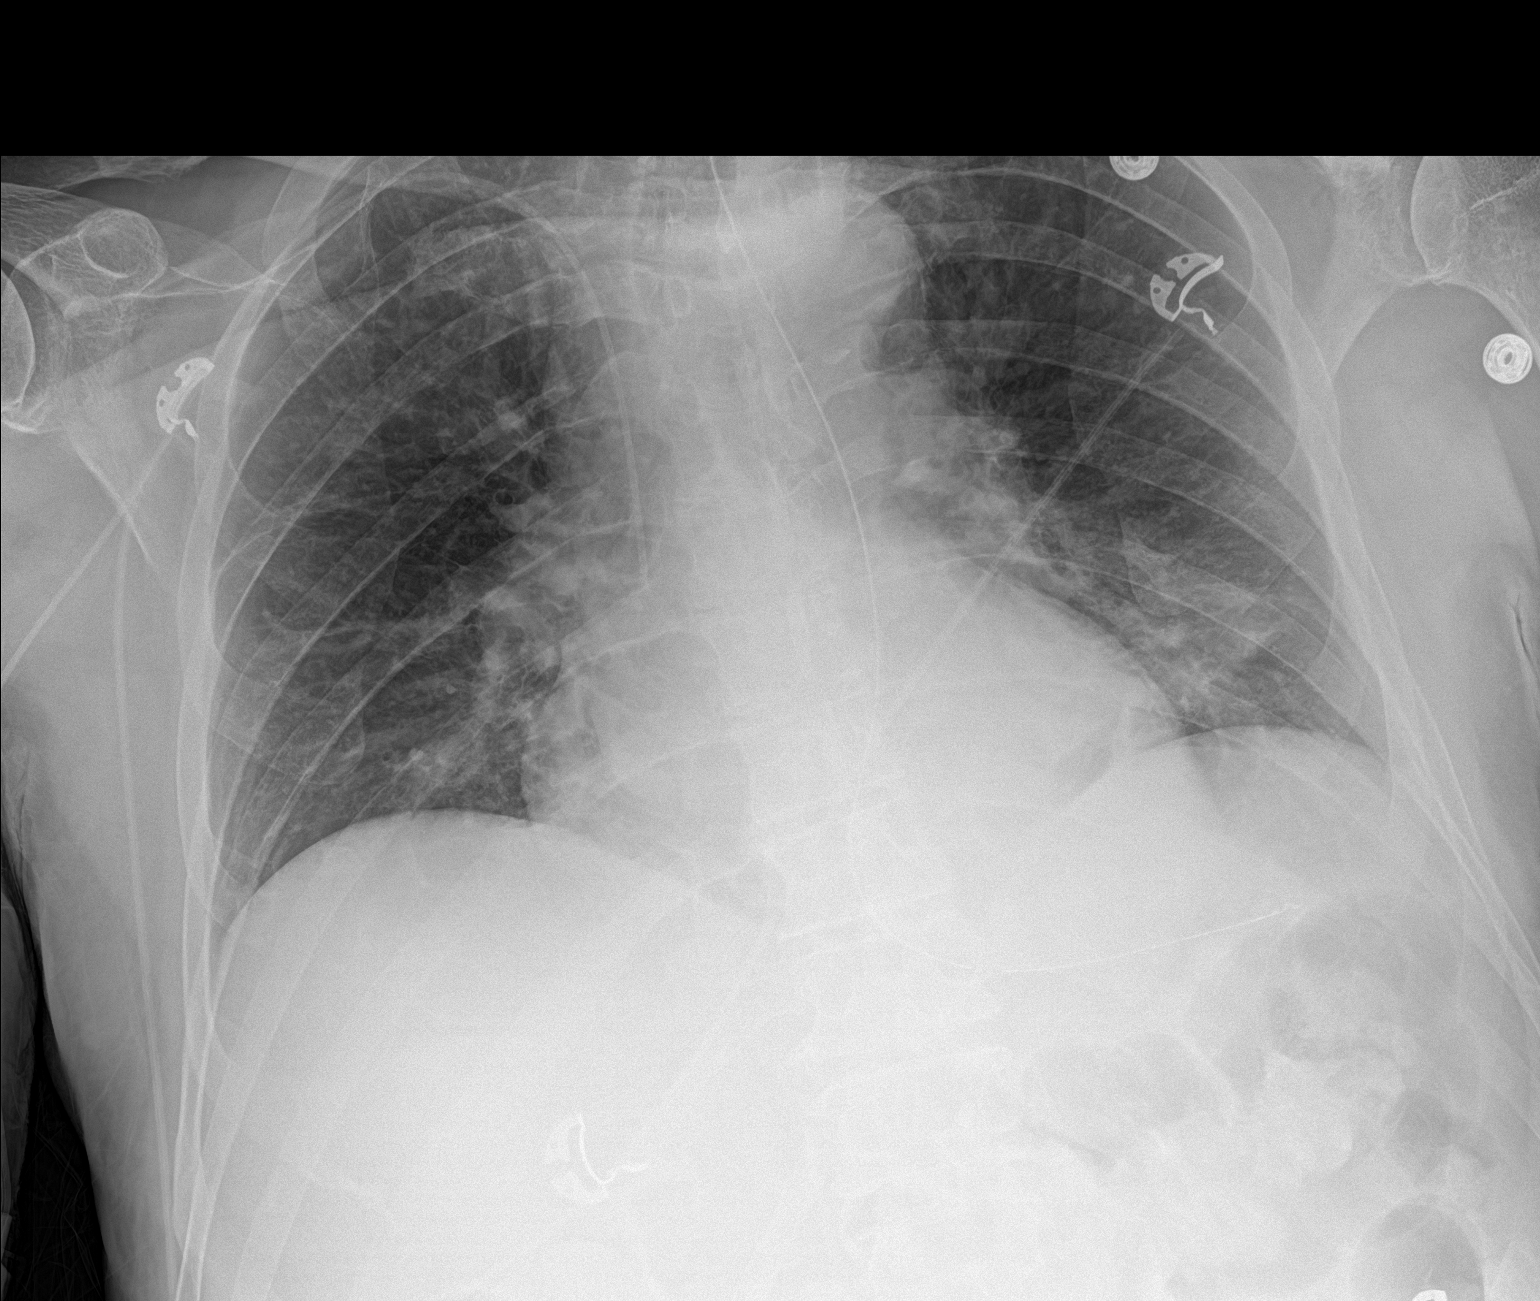

[1 of 1 positions shown; findings below may reference images not displayed]

FINDINGS: A right PICC terminates over the lower SVC. Enteric tube terminates
over the proximal stomach. The cardiomediastinal silhouette is
unchanged. Minimal right basilar opacity is similar to the prior
study and likely represents atelectasis. There is persistent patchy
opacity in the left lung base. No definite pleural effusion is
identified. No pneumothorax is seen.
IMPRESSION: Persistent left basilar opacity which may reflect atelectasis or
pneumonia.

## 2019-11-06 ENCOUNTER — Telehealth: Payer: Self-pay | Admitting: Cardiology

## 2019-11-06 NOTE — Telephone Encounter (Signed)
LMOM; please call back for order.  Coumadin clinic already talked to patient. See anti-coagulation note for instructions.

## 2019-11-06 NOTE — Telephone Encounter (Signed)
[name] [name] was returning call regarding INR check of 1.37

## 2019-11-07 NOTE — Telephone Encounter (Signed)
Talked to Rn. See- anticoagulation note for details

## 2019-11-13 IMAGING — DX DG ABDOMEN 2V
3 series · 3 of 3 positions shown · non-contrast
Comparison: 02/17/2019

CLINICAL DATA: Abdominal pain and tenderness

EXAM:
ABDOMEN - 2 VIEW

[w abdomen decub]
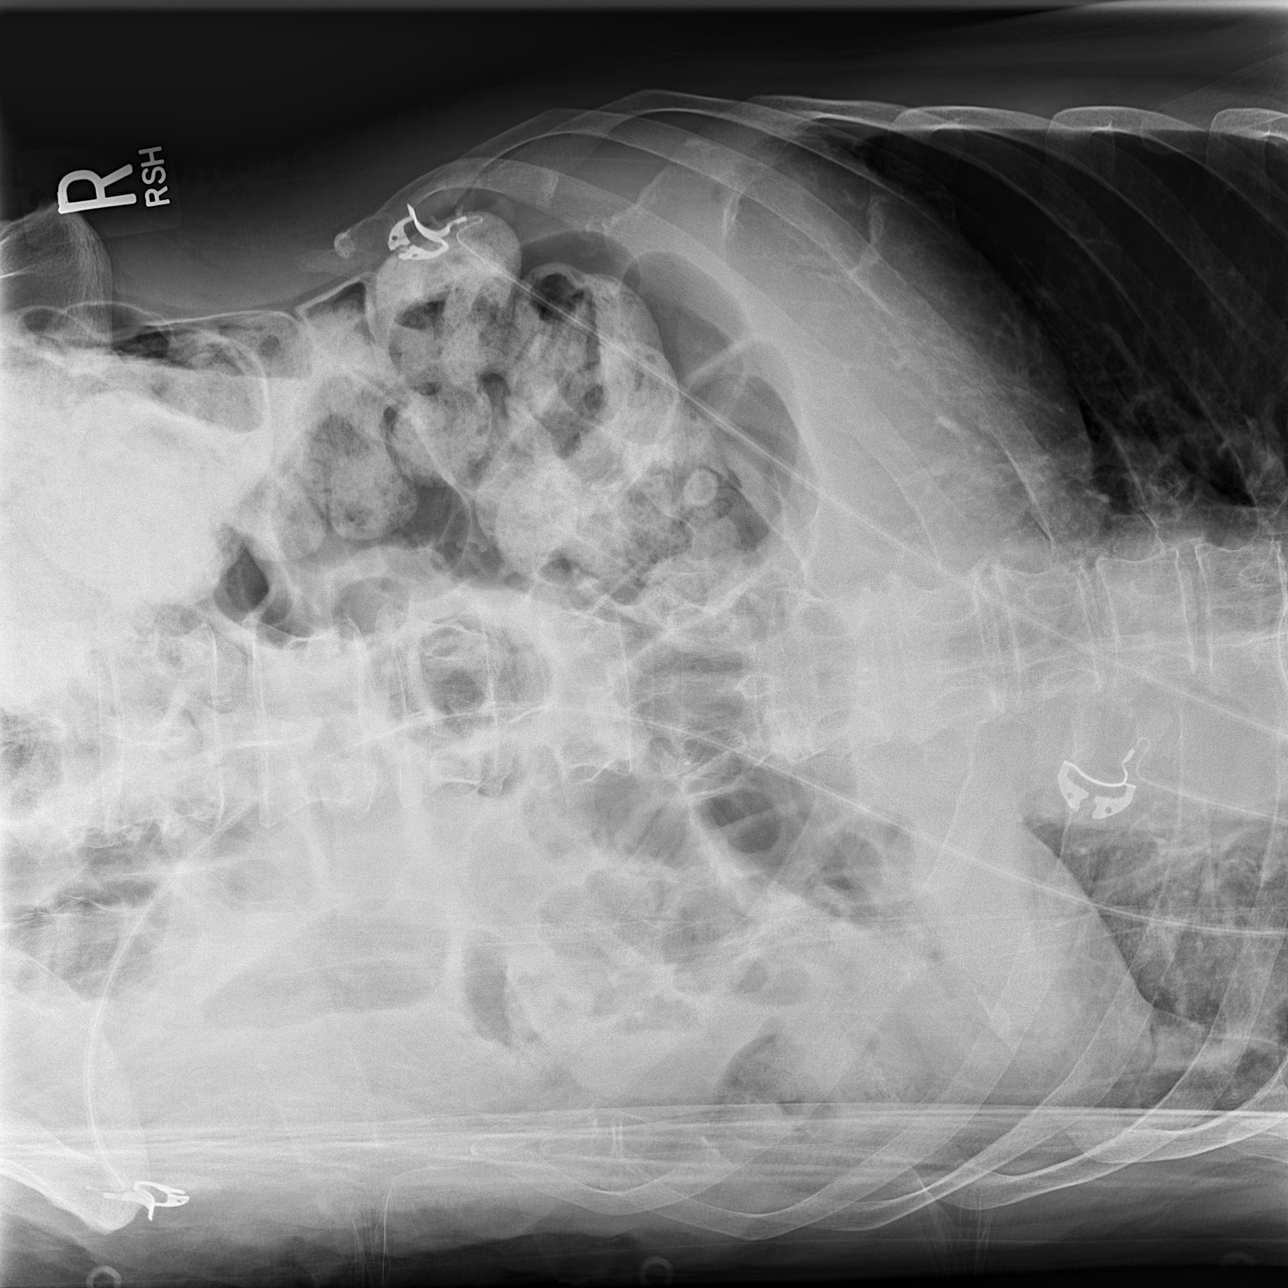

[t abdomen supine (1 of 2)]
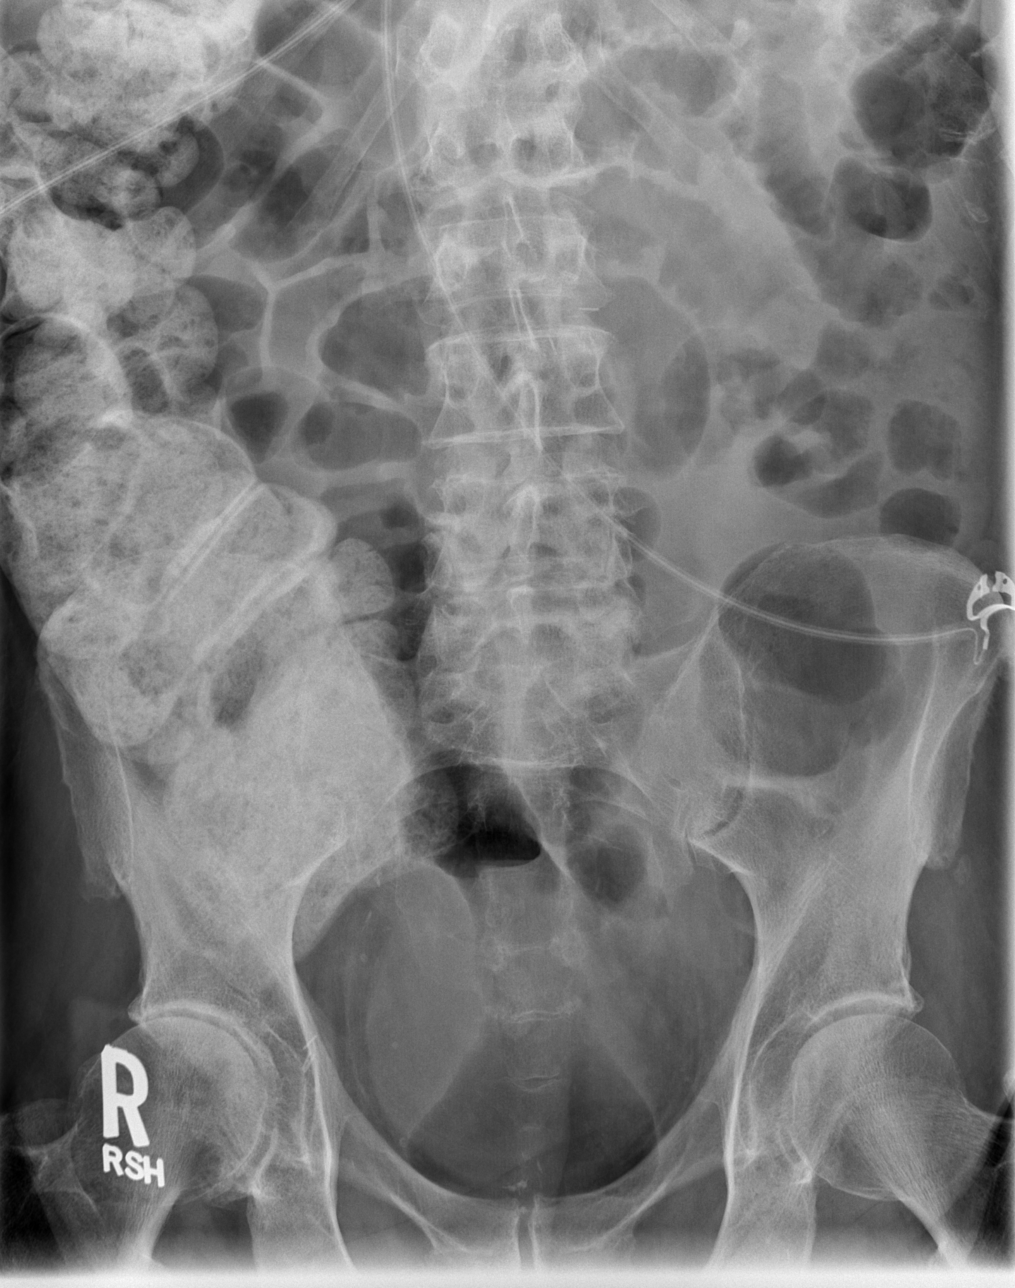

[t abdomen supine (2 of 2)]
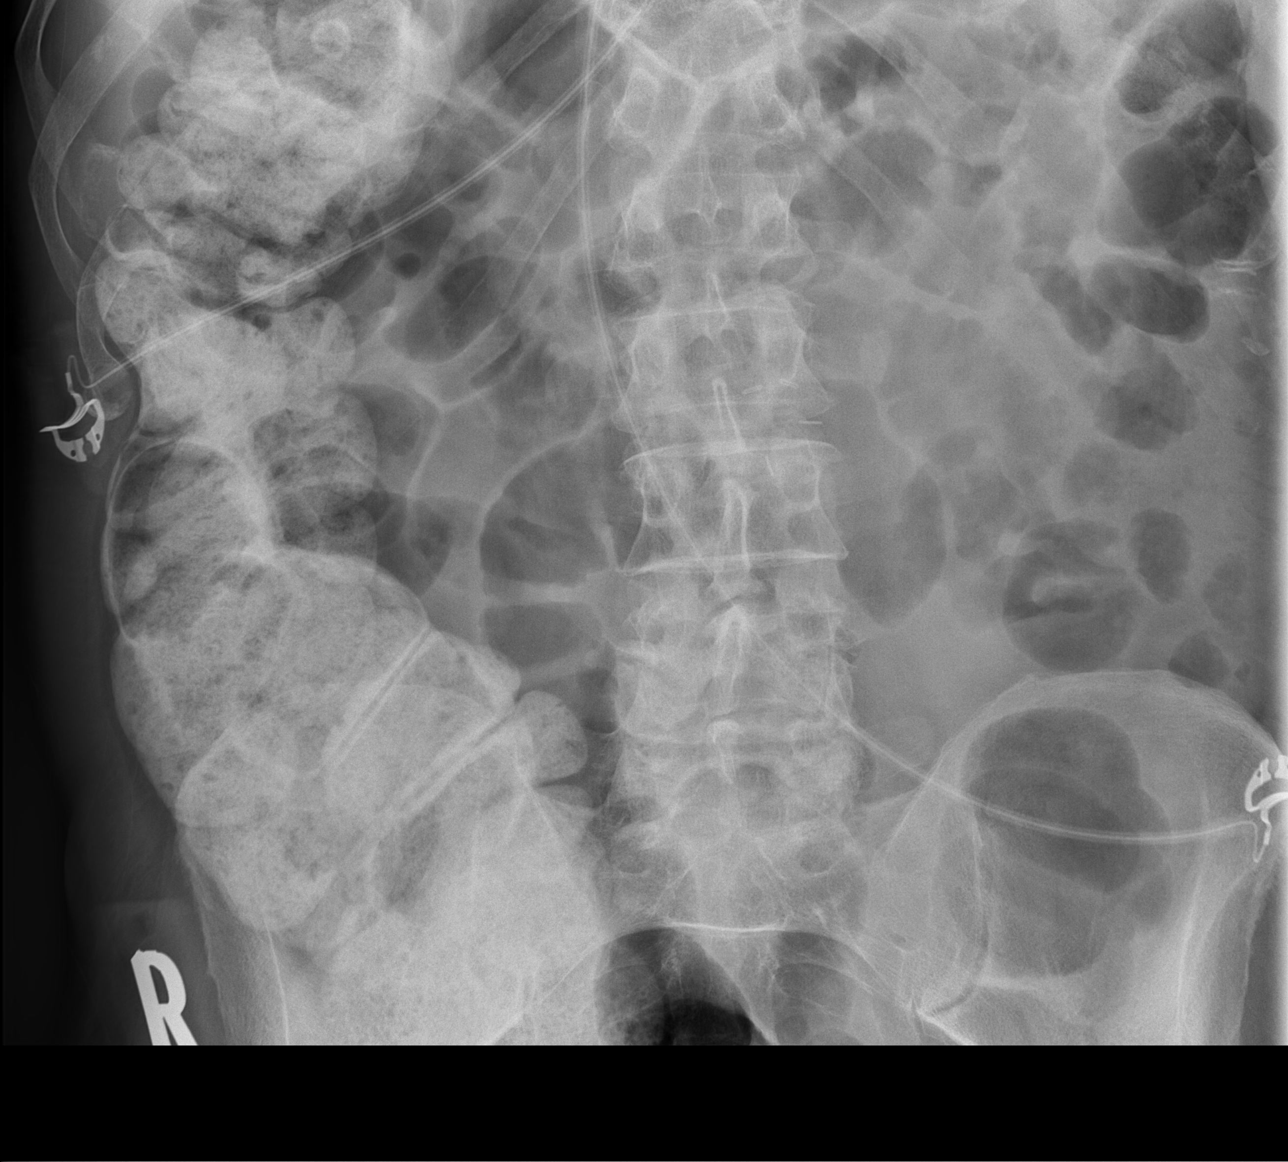

[3 of 3 positions shown; findings below may reference images not displayed]

FINDINGS: Scattered large and small bowel gas is noted. Retained contrast and
fecal material is noted within the right colon stable from 4 days
previous. No obstructive changes are seen. No free air is noted. No
abnormal mass or abnormal calcifications are noted. Mild
degenerative change of the lumbar spine is seen.
IMPRESSION: Stable retained fecal material within the right colon from 4 days
previous consistent with a degree of constipation. No other focal
abnormality is noted.

## 2019-11-15 DIAGNOSIS — Z5181 Encounter for therapeutic drug level monitoring: Secondary | ICD-10-CM | POA: Diagnosis not present

## 2019-11-15 DIAGNOSIS — I480 Paroxysmal atrial fibrillation: Secondary | ICD-10-CM | POA: Diagnosis not present

## 2019-11-15 DIAGNOSIS — I69320 Aphasia following cerebral infarction: Secondary | ICD-10-CM | POA: Diagnosis not present

## 2019-11-15 DIAGNOSIS — Z7901 Long term (current) use of anticoagulants: Secondary | ICD-10-CM | POA: Diagnosis not present

## 2019-11-15 DIAGNOSIS — I70213 Atherosclerosis of native arteries of extremities with intermittent claudication, bilateral legs: Secondary | ICD-10-CM | POA: Diagnosis not present

## 2019-11-15 DIAGNOSIS — I69351 Hemiplegia and hemiparesis following cerebral infarction affecting right dominant side: Secondary | ICD-10-CM | POA: Diagnosis not present

## 2019-11-15 LAB — POCT INR: INR: 2.6 (ref 2.0–3.0)

## 2019-11-18 ENCOUNTER — Ambulatory Visit (INDEPENDENT_AMBULATORY_CARE_PROVIDER_SITE_OTHER): Payer: Medicare HMO | Admitting: Internal Medicine

## 2019-11-18 DIAGNOSIS — Z5181 Encounter for therapeutic drug level monitoring: Secondary | ICD-10-CM | POA: Diagnosis not present

## 2019-11-18 DIAGNOSIS — I480 Paroxysmal atrial fibrillation: Secondary | ICD-10-CM

## 2019-11-18 DIAGNOSIS — I639 Cerebral infarction, unspecified: Secondary | ICD-10-CM

## 2019-11-20 DIAGNOSIS — I480 Paroxysmal atrial fibrillation: Secondary | ICD-10-CM | POA: Diagnosis not present

## 2019-11-20 DIAGNOSIS — I70213 Atherosclerosis of native arteries of extremities with intermittent claudication, bilateral legs: Secondary | ICD-10-CM | POA: Diagnosis not present

## 2019-11-20 DIAGNOSIS — Z5181 Encounter for therapeutic drug level monitoring: Secondary | ICD-10-CM | POA: Diagnosis not present

## 2019-11-20 DIAGNOSIS — I69320 Aphasia following cerebral infarction: Secondary | ICD-10-CM | POA: Diagnosis not present

## 2019-11-20 DIAGNOSIS — Z7901 Long term (current) use of anticoagulants: Secondary | ICD-10-CM | POA: Diagnosis not present

## 2019-11-20 DIAGNOSIS — I69351 Hemiplegia and hemiparesis following cerebral infarction affecting right dominant side: Secondary | ICD-10-CM | POA: Diagnosis not present

## 2019-11-25 ENCOUNTER — Ambulatory Visit (INDEPENDENT_AMBULATORY_CARE_PROVIDER_SITE_OTHER): Payer: Medicare HMO | Admitting: Cardiology

## 2019-11-25 DIAGNOSIS — I639 Cerebral infarction, unspecified: Secondary | ICD-10-CM

## 2019-11-25 DIAGNOSIS — Z5181 Encounter for therapeutic drug level monitoring: Secondary | ICD-10-CM

## 2019-11-25 DIAGNOSIS — Z7901 Long term (current) use of anticoagulants: Secondary | ICD-10-CM | POA: Diagnosis not present

## 2019-11-25 DIAGNOSIS — I480 Paroxysmal atrial fibrillation: Secondary | ICD-10-CM

## 2019-11-25 DIAGNOSIS — I69351 Hemiplegia and hemiparesis following cerebral infarction affecting right dominant side: Secondary | ICD-10-CM | POA: Diagnosis not present

## 2019-11-25 DIAGNOSIS — I69320 Aphasia following cerebral infarction: Secondary | ICD-10-CM | POA: Diagnosis not present

## 2019-11-25 DIAGNOSIS — I70213 Atherosclerosis of native arteries of extremities with intermittent claudication, bilateral legs: Secondary | ICD-10-CM | POA: Diagnosis not present

## 2019-11-25 LAB — POCT INR: INR: 3.2 — AB (ref 2.0–3.0)

## 2019-11-25 NOTE — Patient Instructions (Signed)
Take 2.5mg  tonight then decrease dose to 5mg  daily.  Recheck in 1 week. Order given to [place]

## 2019-12-03 ENCOUNTER — Ambulatory Visit (INDEPENDENT_AMBULATORY_CARE_PROVIDER_SITE_OTHER): Payer: Medicare HMO | Admitting: Pharmacist

## 2019-12-03 DIAGNOSIS — I69351 Hemiplegia and hemiparesis following cerebral infarction affecting right dominant side: Secondary | ICD-10-CM | POA: Diagnosis not present

## 2019-12-03 DIAGNOSIS — Z5181 Encounter for therapeutic drug level monitoring: Secondary | ICD-10-CM

## 2019-12-03 DIAGNOSIS — Z7901 Long term (current) use of anticoagulants: Secondary | ICD-10-CM | POA: Diagnosis not present

## 2019-12-03 DIAGNOSIS — I69320 Aphasia following cerebral infarction: Secondary | ICD-10-CM | POA: Diagnosis not present

## 2019-12-03 DIAGNOSIS — I480 Paroxysmal atrial fibrillation: Secondary | ICD-10-CM | POA: Diagnosis not present

## 2019-12-03 DIAGNOSIS — I639 Cerebral infarction, unspecified: Secondary | ICD-10-CM

## 2019-12-03 DIAGNOSIS — I70213 Atherosclerosis of native arteries of extremities with intermittent claudication, bilateral legs: Secondary | ICD-10-CM | POA: Diagnosis not present

## 2019-12-03 LAB — POCT INR: INR: 2.6 (ref 2.0–3.0)

## 2019-12-12 ENCOUNTER — Telehealth: Payer: Self-pay | Admitting: Cardiology

## 2019-12-12 ENCOUNTER — Ambulatory Visit (INDEPENDENT_AMBULATORY_CARE_PROVIDER_SITE_OTHER): Payer: Medicare HMO | Admitting: *Deleted

## 2019-12-12 DIAGNOSIS — I480 Paroxysmal atrial fibrillation: Secondary | ICD-10-CM | POA: Diagnosis not present

## 2019-12-12 DIAGNOSIS — I69351 Hemiplegia and hemiparesis following cerebral infarction affecting right dominant side: Secondary | ICD-10-CM | POA: Diagnosis not present

## 2019-12-12 DIAGNOSIS — I639 Cerebral infarction, unspecified: Secondary | ICD-10-CM

## 2019-12-12 DIAGNOSIS — Z5181 Encounter for therapeutic drug level monitoring: Secondary | ICD-10-CM

## 2019-12-12 DIAGNOSIS — I70213 Atherosclerosis of native arteries of extremities with intermittent claudication, bilateral legs: Secondary | ICD-10-CM | POA: Diagnosis not present

## 2019-12-12 DIAGNOSIS — Z7901 Long term (current) use of anticoagulants: Secondary | ICD-10-CM | POA: Diagnosis not present

## 2019-12-12 DIAGNOSIS — I69320 Aphasia following cerebral infarction: Secondary | ICD-10-CM | POA: Diagnosis not present

## 2019-12-12 LAB — POCT INR: INR: 2.6 (ref 2.0–3.0)

## 2019-12-12 NOTE — Patient Instructions (Signed)
Continue taking warfarin 5mg  daily.  Recheck in 1 week. Order given to [place]

## 2019-12-12 NOTE — Telephone Encounter (Signed)
Done.  See coumadin note. 

## 2019-12-12 NOTE — Telephone Encounter (Signed)
New Message  Katherine Basset, nurse from [facility], calling to speak with RN Edrick Oh. States that patient's INR 2.6 and she can be reached back at [phone].

## 2019-12-17 ENCOUNTER — Telehealth: Payer: Self-pay | Admitting: *Deleted

## 2019-12-17 ENCOUNTER — Ambulatory Visit (INDEPENDENT_AMBULATORY_CARE_PROVIDER_SITE_OTHER): Payer: Medicare HMO | Admitting: *Deleted

## 2019-12-17 DIAGNOSIS — Z5181 Encounter for therapeutic drug level monitoring: Secondary | ICD-10-CM

## 2019-12-17 DIAGNOSIS — I70213 Atherosclerosis of native arteries of extremities with intermittent claudication, bilateral legs: Secondary | ICD-10-CM | POA: Diagnosis not present

## 2019-12-17 DIAGNOSIS — I69320 Aphasia following cerebral infarction: Secondary | ICD-10-CM | POA: Diagnosis not present

## 2019-12-17 DIAGNOSIS — Z7901 Long term (current) use of anticoagulants: Secondary | ICD-10-CM | POA: Diagnosis not present

## 2019-12-17 DIAGNOSIS — I480 Paroxysmal atrial fibrillation: Secondary | ICD-10-CM | POA: Diagnosis not present

## 2019-12-17 DIAGNOSIS — I69351 Hemiplegia and hemiparesis following cerebral infarction affecting right dominant side: Secondary | ICD-10-CM | POA: Diagnosis not present

## 2019-12-17 LAB — POCT INR: INR: 2.4 (ref 2.0–3.0)

## 2019-12-17 NOTE — Patient Instructions (Signed)
Continue taking warfarin 5mg  daily.  Recheck in 2 weeks. Order given to [place]

## 2019-12-17 NOTE — Telephone Encounter (Signed)
[name] [name] with [facility] [phone]  INR   2.4

## 2019-12-17 NOTE — Telephone Encounter (Signed)
Orders given to Kathlee Nations RN [facility]

## 2019-12-25 DIAGNOSIS — I70213 Atherosclerosis of native arteries of extremities with intermittent claudication, bilateral legs: Secondary | ICD-10-CM | POA: Diagnosis not present

## 2019-12-25 DIAGNOSIS — I480 Paroxysmal atrial fibrillation: Secondary | ICD-10-CM | POA: Diagnosis not present

## 2019-12-25 DIAGNOSIS — I69320 Aphasia following cerebral infarction: Secondary | ICD-10-CM | POA: Diagnosis not present

## 2019-12-25 DIAGNOSIS — Z5181 Encounter for therapeutic drug level monitoring: Secondary | ICD-10-CM | POA: Diagnosis not present

## 2019-12-25 DIAGNOSIS — I69351 Hemiplegia and hemiparesis following cerebral infarction affecting right dominant side: Secondary | ICD-10-CM | POA: Diagnosis not present

## 2019-12-25 DIAGNOSIS — Z7901 Long term (current) use of anticoagulants: Secondary | ICD-10-CM | POA: Diagnosis not present

## 2020-01-01 ENCOUNTER — Ambulatory Visit (INDEPENDENT_AMBULATORY_CARE_PROVIDER_SITE_OTHER): Payer: Medicare HMO | Admitting: *Deleted

## 2020-01-01 DIAGNOSIS — Z7901 Long term (current) use of anticoagulants: Secondary | ICD-10-CM | POA: Diagnosis not present

## 2020-01-01 DIAGNOSIS — I639 Cerebral infarction, unspecified: Secondary | ICD-10-CM

## 2020-01-01 DIAGNOSIS — Z5181 Encounter for therapeutic drug level monitoring: Secondary | ICD-10-CM

## 2020-01-01 DIAGNOSIS — I69320 Aphasia following cerebral infarction: Secondary | ICD-10-CM | POA: Diagnosis not present

## 2020-01-01 DIAGNOSIS — I480 Paroxysmal atrial fibrillation: Secondary | ICD-10-CM | POA: Diagnosis not present

## 2020-01-01 DIAGNOSIS — I70213 Atherosclerosis of native arteries of extremities with intermittent claudication, bilateral legs: Secondary | ICD-10-CM | POA: Diagnosis not present

## 2020-01-01 DIAGNOSIS — I69351 Hemiplegia and hemiparesis following cerebral infarction affecting right dominant side: Secondary | ICD-10-CM | POA: Diagnosis not present

## 2020-01-01 LAB — POCT INR: INR: 2.5 (ref 2.0–3.0)

## 2020-01-01 NOTE — Patient Instructions (Signed)
Continue taking warfarin 5mg  daily.  Recheck in 2 weeks. Order given to [facility]

## 2020-01-23 ENCOUNTER — Telehealth: Payer: Self-pay | Admitting: *Deleted

## 2020-01-23 ENCOUNTER — Ambulatory Visit (INDEPENDENT_AMBULATORY_CARE_PROVIDER_SITE_OTHER): Payer: Medicare HMO | Admitting: Cardiology

## 2020-01-23 DIAGNOSIS — I69351 Hemiplegia and hemiparesis following cerebral infarction affecting right dominant side: Secondary | ICD-10-CM | POA: Diagnosis not present

## 2020-01-23 DIAGNOSIS — I70213 Atherosclerosis of native arteries of extremities with intermittent claudication, bilateral legs: Secondary | ICD-10-CM | POA: Diagnosis not present

## 2020-01-23 DIAGNOSIS — I69320 Aphasia following cerebral infarction: Secondary | ICD-10-CM | POA: Diagnosis not present

## 2020-01-23 DIAGNOSIS — Z5181 Encounter for therapeutic drug level monitoring: Secondary | ICD-10-CM

## 2020-01-23 DIAGNOSIS — I480 Paroxysmal atrial fibrillation: Secondary | ICD-10-CM | POA: Diagnosis not present

## 2020-01-23 DIAGNOSIS — Z7901 Long term (current) use of anticoagulants: Secondary | ICD-10-CM | POA: Diagnosis not present

## 2020-01-23 LAB — POCT INR: INR: 2.2 (ref 2.0–3.0)

## 2020-01-23 NOTE — Telephone Encounter (Signed)
[name] [name] with [facility] [phone]  INR 2.2

## 2020-01-23 NOTE — Patient Instructions (Signed)
Description   Continue taking warfarin 5mg  daily.  Recheck in 2 weeks. Order given to RN [facility] (recheck the week of 2/14)

## 2020-01-23 NOTE — Telephone Encounter (Signed)
See Anti-coag encounter from today

## 2020-02-05 ENCOUNTER — Ambulatory Visit (INDEPENDENT_AMBULATORY_CARE_PROVIDER_SITE_OTHER): Payer: Medicare HMO | Admitting: *Deleted

## 2020-02-05 DIAGNOSIS — Z7901 Long term (current) use of anticoagulants: Secondary | ICD-10-CM | POA: Diagnosis not present

## 2020-02-05 DIAGNOSIS — I480 Paroxysmal atrial fibrillation: Secondary | ICD-10-CM | POA: Diagnosis not present

## 2020-02-05 DIAGNOSIS — I639 Cerebral infarction, unspecified: Secondary | ICD-10-CM | POA: Diagnosis not present

## 2020-02-05 DIAGNOSIS — I70213 Atherosclerosis of native arteries of extremities with intermittent claudication, bilateral legs: Secondary | ICD-10-CM | POA: Diagnosis not present

## 2020-02-05 DIAGNOSIS — I69351 Hemiplegia and hemiparesis following cerebral infarction affecting right dominant side: Secondary | ICD-10-CM | POA: Diagnosis not present

## 2020-02-05 DIAGNOSIS — Z5181 Encounter for therapeutic drug level monitoring: Secondary | ICD-10-CM | POA: Diagnosis not present

## 2020-02-05 DIAGNOSIS — I69320 Aphasia following cerebral infarction: Secondary | ICD-10-CM | POA: Diagnosis not present

## 2020-02-05 LAB — POCT INR: INR: 2.2 (ref 2.0–3.0)

## 2020-02-19 ENCOUNTER — Ambulatory Visit (INDEPENDENT_AMBULATORY_CARE_PROVIDER_SITE_OTHER): Payer: Medicare HMO | Admitting: *Deleted

## 2020-02-19 DIAGNOSIS — I69320 Aphasia following cerebral infarction: Secondary | ICD-10-CM | POA: Diagnosis not present

## 2020-02-19 DIAGNOSIS — Z5181 Encounter for therapeutic drug level monitoring: Secondary | ICD-10-CM | POA: Diagnosis not present

## 2020-02-19 DIAGNOSIS — I69351 Hemiplegia and hemiparesis following cerebral infarction affecting right dominant side: Secondary | ICD-10-CM | POA: Diagnosis not present

## 2020-02-19 DIAGNOSIS — I639 Cerebral infarction, unspecified: Secondary | ICD-10-CM | POA: Diagnosis not present

## 2020-02-19 DIAGNOSIS — I480 Paroxysmal atrial fibrillation: Secondary | ICD-10-CM | POA: Diagnosis not present

## 2020-02-19 DIAGNOSIS — I70213 Atherosclerosis of native arteries of extremities with intermittent claudication, bilateral legs: Secondary | ICD-10-CM | POA: Diagnosis not present

## 2020-02-19 DIAGNOSIS — Z7901 Long term (current) use of anticoagulants: Secondary | ICD-10-CM | POA: Diagnosis not present

## 2020-02-19 LAB — POCT INR: INR: 1.3 — AB (ref 2.0–3.0)

## 2020-02-19 NOTE — Patient Instructions (Signed)
Take warfarin 2 tablets tonight, 1 1/2 tablets tomorrow night then resume 1 tablet daily.  Recheck in 1 week. Order given to [employer] [facility]

## 2020-02-26 ENCOUNTER — Ambulatory Visit (INDEPENDENT_AMBULATORY_CARE_PROVIDER_SITE_OTHER): Payer: Medicare HMO | Admitting: *Deleted

## 2020-02-26 DIAGNOSIS — I639 Cerebral infarction, unspecified: Secondary | ICD-10-CM | POA: Diagnosis not present

## 2020-02-26 DIAGNOSIS — Z5181 Encounter for therapeutic drug level monitoring: Secondary | ICD-10-CM

## 2020-02-26 DIAGNOSIS — I70213 Atherosclerosis of native arteries of extremities with intermittent claudication, bilateral legs: Secondary | ICD-10-CM | POA: Diagnosis not present

## 2020-02-26 DIAGNOSIS — Z7901 Long term (current) use of anticoagulants: Secondary | ICD-10-CM | POA: Diagnosis not present

## 2020-02-26 DIAGNOSIS — I69351 Hemiplegia and hemiparesis following cerebral infarction affecting right dominant side: Secondary | ICD-10-CM | POA: Diagnosis not present

## 2020-02-26 DIAGNOSIS — I69320 Aphasia following cerebral infarction: Secondary | ICD-10-CM | POA: Diagnosis not present

## 2020-02-26 DIAGNOSIS — I480 Paroxysmal atrial fibrillation: Secondary | ICD-10-CM | POA: Diagnosis not present

## 2020-02-26 LAB — POCT INR: INR: 1.8 — AB (ref 2.0–3.0)

## 2020-02-26 NOTE — Patient Instructions (Signed)
Increase warfarin to 1 tablet daily except 1 1/2 tablets on Sundays and Wednesdays.  Recheck in 1 week. Order given to [employer] [facility]

## 2020-03-04 ENCOUNTER — Ambulatory Visit (INDEPENDENT_AMBULATORY_CARE_PROVIDER_SITE_OTHER): Payer: Medicare HMO | Admitting: *Deleted

## 2020-03-04 DIAGNOSIS — I639 Cerebral infarction, unspecified: Secondary | ICD-10-CM | POA: Diagnosis not present

## 2020-03-04 DIAGNOSIS — I480 Paroxysmal atrial fibrillation: Secondary | ICD-10-CM | POA: Diagnosis not present

## 2020-03-04 DIAGNOSIS — I70213 Atherosclerosis of native arteries of extremities with intermittent claudication, bilateral legs: Secondary | ICD-10-CM | POA: Diagnosis not present

## 2020-03-04 DIAGNOSIS — Z5181 Encounter for therapeutic drug level monitoring: Secondary | ICD-10-CM | POA: Diagnosis not present

## 2020-03-04 DIAGNOSIS — I69351 Hemiplegia and hemiparesis following cerebral infarction affecting right dominant side: Secondary | ICD-10-CM | POA: Diagnosis not present

## 2020-03-04 DIAGNOSIS — I69320 Aphasia following cerebral infarction: Secondary | ICD-10-CM | POA: Diagnosis not present

## 2020-03-04 DIAGNOSIS — Z7901 Long term (current) use of anticoagulants: Secondary | ICD-10-CM | POA: Diagnosis not present

## 2020-03-04 LAB — POCT INR: INR: 2.1 (ref 2.0–3.0)

## 2020-03-04 NOTE — Patient Instructions (Signed)
Continue warfarin 1 tablet daily except 1 1/2 tablets on Sundays and Wednesdays.  Recheck in 1 week. Order given to [employer] [facility]

## 2020-03-10 ENCOUNTER — Other Ambulatory Visit: Payer: Self-pay | Admitting: Adult Health

## 2020-03-12 ENCOUNTER — Ambulatory Visit (INDEPENDENT_AMBULATORY_CARE_PROVIDER_SITE_OTHER): Payer: Medicare HMO | Admitting: *Deleted

## 2020-03-12 DIAGNOSIS — Z5181 Encounter for therapeutic drug level monitoring: Secondary | ICD-10-CM

## 2020-03-12 DIAGNOSIS — I639 Cerebral infarction, unspecified: Secondary | ICD-10-CM | POA: Diagnosis not present

## 2020-03-12 DIAGNOSIS — I69320 Aphasia following cerebral infarction: Secondary | ICD-10-CM | POA: Diagnosis not present

## 2020-03-12 DIAGNOSIS — I480 Paroxysmal atrial fibrillation: Secondary | ICD-10-CM | POA: Diagnosis not present

## 2020-03-12 DIAGNOSIS — I70213 Atherosclerosis of native arteries of extremities with intermittent claudication, bilateral legs: Secondary | ICD-10-CM | POA: Diagnosis not present

## 2020-03-12 DIAGNOSIS — I69351 Hemiplegia and hemiparesis following cerebral infarction affecting right dominant side: Secondary | ICD-10-CM | POA: Diagnosis not present

## 2020-03-12 DIAGNOSIS — Z7901 Long term (current) use of anticoagulants: Secondary | ICD-10-CM | POA: Diagnosis not present

## 2020-03-12 LAB — POCT INR: INR: 1.9 — AB (ref 2.0–3.0)

## 2020-03-12 NOTE — Patient Instructions (Signed)
Increase warfarin to 1 tablet daily except 1 1/2 tablets on Sundays, Tuesdays and Thursdays.  Recheck in 1 week. Order given to [employer] [facility]

## 2020-03-17 ENCOUNTER — Ambulatory Visit (INDEPENDENT_AMBULATORY_CARE_PROVIDER_SITE_OTHER): Payer: Medicare HMO | Admitting: *Deleted

## 2020-03-17 DIAGNOSIS — I69351 Hemiplegia and hemiparesis following cerebral infarction affecting right dominant side: Secondary | ICD-10-CM | POA: Diagnosis not present

## 2020-03-17 DIAGNOSIS — I639 Cerebral infarction, unspecified: Secondary | ICD-10-CM

## 2020-03-17 DIAGNOSIS — I480 Paroxysmal atrial fibrillation: Secondary | ICD-10-CM | POA: Diagnosis not present

## 2020-03-17 DIAGNOSIS — I69320 Aphasia following cerebral infarction: Secondary | ICD-10-CM | POA: Diagnosis not present

## 2020-03-17 DIAGNOSIS — Z7901 Long term (current) use of anticoagulants: Secondary | ICD-10-CM | POA: Diagnosis not present

## 2020-03-17 DIAGNOSIS — Z5181 Encounter for therapeutic drug level monitoring: Secondary | ICD-10-CM

## 2020-03-17 DIAGNOSIS — I70213 Atherosclerosis of native arteries of extremities with intermittent claudication, bilateral legs: Secondary | ICD-10-CM | POA: Diagnosis not present

## 2020-03-17 LAB — POCT INR: INR: 2.3 (ref 2.0–3.0)

## 2020-03-17 NOTE — Patient Instructions (Signed)
Continue warfarin 1 tablet daily except 1 1/2 tablets on Sundays, Tuesdays and Thursdays.  Recheck in 1 week. Order given to [employer] [facility]

## 2020-03-24 ENCOUNTER — Ambulatory Visit (INDEPENDENT_AMBULATORY_CARE_PROVIDER_SITE_OTHER): Payer: Medicare HMO | Admitting: *Deleted

## 2020-03-24 DIAGNOSIS — I480 Paroxysmal atrial fibrillation: Secondary | ICD-10-CM | POA: Diagnosis not present

## 2020-03-24 DIAGNOSIS — I639 Cerebral infarction, unspecified: Secondary | ICD-10-CM

## 2020-03-24 DIAGNOSIS — I69351 Hemiplegia and hemiparesis following cerebral infarction affecting right dominant side: Secondary | ICD-10-CM | POA: Diagnosis not present

## 2020-03-24 DIAGNOSIS — Z7901 Long term (current) use of anticoagulants: Secondary | ICD-10-CM | POA: Diagnosis not present

## 2020-03-24 DIAGNOSIS — Z5181 Encounter for therapeutic drug level monitoring: Secondary | ICD-10-CM | POA: Diagnosis not present

## 2020-03-24 DIAGNOSIS — I70213 Atherosclerosis of native arteries of extremities with intermittent claudication, bilateral legs: Secondary | ICD-10-CM | POA: Diagnosis not present

## 2020-03-24 DIAGNOSIS — I69320 Aphasia following cerebral infarction: Secondary | ICD-10-CM | POA: Diagnosis not present

## 2020-03-24 LAB — POCT INR: INR: 2.3 (ref 2.0–3.0)

## 2020-03-24 NOTE — Patient Instructions (Signed)
Continue warfarin 1 tablet daily except 1 1/2 tablets on Sundays, Tuesdays and Thursdays.  Recheck in 2 week. Order given to Ernest Pine RN [facility]

## 2020-04-08 ENCOUNTER — Ambulatory Visit (INDEPENDENT_AMBULATORY_CARE_PROVIDER_SITE_OTHER): Payer: Medicare HMO | Admitting: *Deleted

## 2020-04-08 DIAGNOSIS — I69320 Aphasia following cerebral infarction: Secondary | ICD-10-CM | POA: Diagnosis not present

## 2020-04-08 DIAGNOSIS — Z5181 Encounter for therapeutic drug level monitoring: Secondary | ICD-10-CM | POA: Diagnosis not present

## 2020-04-08 DIAGNOSIS — I639 Cerebral infarction, unspecified: Secondary | ICD-10-CM | POA: Diagnosis not present

## 2020-04-08 DIAGNOSIS — I70213 Atherosclerosis of native arteries of extremities with intermittent claudication, bilateral legs: Secondary | ICD-10-CM | POA: Diagnosis not present

## 2020-04-08 DIAGNOSIS — I69351 Hemiplegia and hemiparesis following cerebral infarction affecting right dominant side: Secondary | ICD-10-CM | POA: Diagnosis not present

## 2020-04-08 DIAGNOSIS — I480 Paroxysmal atrial fibrillation: Secondary | ICD-10-CM | POA: Diagnosis not present

## 2020-04-08 DIAGNOSIS — Z7901 Long term (current) use of anticoagulants: Secondary | ICD-10-CM | POA: Diagnosis not present

## 2020-04-08 LAB — POCT INR: INR: 2.4 (ref 2.0–3.0)

## 2020-04-08 NOTE — Patient Instructions (Signed)
Continue warfarin 1 tablet daily except 1 1/2 tablets on Sundays, Tuesdays and Thursdays.  Recheck in 2 week. Order given to [employer] [facility]

## 2020-04-23 ENCOUNTER — Ambulatory Visit (INDEPENDENT_AMBULATORY_CARE_PROVIDER_SITE_OTHER): Payer: Medicare HMO | Admitting: *Deleted

## 2020-04-23 DIAGNOSIS — Z7901 Long term (current) use of anticoagulants: Secondary | ICD-10-CM | POA: Diagnosis not present

## 2020-04-23 DIAGNOSIS — I69351 Hemiplegia and hemiparesis following cerebral infarction affecting right dominant side: Secondary | ICD-10-CM | POA: Diagnosis not present

## 2020-04-23 DIAGNOSIS — I69320 Aphasia following cerebral infarction: Secondary | ICD-10-CM | POA: Diagnosis not present

## 2020-04-23 DIAGNOSIS — I70213 Atherosclerosis of native arteries of extremities with intermittent claudication, bilateral legs: Secondary | ICD-10-CM | POA: Diagnosis not present

## 2020-04-23 DIAGNOSIS — I639 Cerebral infarction, unspecified: Secondary | ICD-10-CM

## 2020-04-23 DIAGNOSIS — Z5181 Encounter for therapeutic drug level monitoring: Secondary | ICD-10-CM

## 2020-04-23 DIAGNOSIS — I480 Paroxysmal atrial fibrillation: Secondary | ICD-10-CM | POA: Diagnosis not present

## 2020-04-23 LAB — POCT INR: INR: 2 (ref 2.0–3.0)

## 2020-04-23 NOTE — Patient Instructions (Signed)
Take warfarin 2 tablets tonight then resume 1 tablet daily except 1 1/2 tablets on Sundays, Tuesdays and Thursdays.  Recheck in 2 weeks. Order given to [employer] [facility]

## 2020-05-06 ENCOUNTER — Ambulatory Visit (INDEPENDENT_AMBULATORY_CARE_PROVIDER_SITE_OTHER): Payer: Medicare HMO | Admitting: Cardiovascular Disease

## 2020-05-06 DIAGNOSIS — I639 Cerebral infarction, unspecified: Secondary | ICD-10-CM | POA: Diagnosis not present

## 2020-05-06 DIAGNOSIS — I69320 Aphasia following cerebral infarction: Secondary | ICD-10-CM | POA: Diagnosis not present

## 2020-05-06 DIAGNOSIS — I70213 Atherosclerosis of native arteries of extremities with intermittent claudication, bilateral legs: Secondary | ICD-10-CM | POA: Diagnosis not present

## 2020-05-06 DIAGNOSIS — I480 Paroxysmal atrial fibrillation: Secondary | ICD-10-CM

## 2020-05-06 DIAGNOSIS — Z5181 Encounter for therapeutic drug level monitoring: Secondary | ICD-10-CM

## 2020-05-06 DIAGNOSIS — Z7901 Long term (current) use of anticoagulants: Secondary | ICD-10-CM | POA: Diagnosis not present

## 2020-05-06 DIAGNOSIS — I69351 Hemiplegia and hemiparesis following cerebral infarction affecting right dominant side: Secondary | ICD-10-CM | POA: Diagnosis not present

## 2020-05-06 LAB — POCT INR: INR: 3.4 — AB (ref 2.0–3.0)

## 2020-05-06 NOTE — Patient Instructions (Signed)
Description   Skip warfarin today, then resume 1 tablet daily except 1 1/2 tablets on Sundays, Tuesdays and Thursdays.  Recheck in 2 weeks. Order given to [employer] [facility] [phone]

## 2020-05-20 ENCOUNTER — Telehealth: Payer: Self-pay | Admitting: Cardiology

## 2020-05-20 ENCOUNTER — Ambulatory Visit (INDEPENDENT_AMBULATORY_CARE_PROVIDER_SITE_OTHER): Payer: Medicare HMO | Admitting: *Deleted

## 2020-05-20 DIAGNOSIS — Z7901 Long term (current) use of anticoagulants: Secondary | ICD-10-CM | POA: Diagnosis not present

## 2020-05-20 DIAGNOSIS — I480 Paroxysmal atrial fibrillation: Secondary | ICD-10-CM | POA: Diagnosis not present

## 2020-05-20 DIAGNOSIS — I70213 Atherosclerosis of native arteries of extremities with intermittent claudication, bilateral legs: Secondary | ICD-10-CM | POA: Diagnosis not present

## 2020-05-20 DIAGNOSIS — I639 Cerebral infarction, unspecified: Secondary | ICD-10-CM | POA: Diagnosis not present

## 2020-05-20 DIAGNOSIS — I69320 Aphasia following cerebral infarction: Secondary | ICD-10-CM | POA: Diagnosis not present

## 2020-05-20 DIAGNOSIS — I69351 Hemiplegia and hemiparesis following cerebral infarction affecting right dominant side: Secondary | ICD-10-CM | POA: Diagnosis not present

## 2020-05-20 DIAGNOSIS — Z5181 Encounter for therapeutic drug level monitoring: Secondary | ICD-10-CM | POA: Diagnosis not present

## 2020-05-20 LAB — POCT INR: INR: 2.7 (ref 2.0–3.0)

## 2020-05-20 NOTE — Patient Instructions (Signed)
Continue warfarin 1 tablet daily except 1 1/2 tablets on Sundays, Tuesdays and Thursdays.  Recheck in 2 weeks. Order given to Liz RN [facility] #[phone]   

## 2020-05-20 NOTE — Telephone Encounter (Signed)
Done.  See coumadin note. 

## 2020-05-20 NOTE — Telephone Encounter (Signed)
INR 2.7.

## 2020-06-01 ENCOUNTER — Ambulatory Visit (INDEPENDENT_AMBULATORY_CARE_PROVIDER_SITE_OTHER): Payer: Medicare HMO | Admitting: *Deleted

## 2020-06-01 DIAGNOSIS — Z7901 Long term (current) use of anticoagulants: Secondary | ICD-10-CM | POA: Diagnosis not present

## 2020-06-01 DIAGNOSIS — I69351 Hemiplegia and hemiparesis following cerebral infarction affecting right dominant side: Secondary | ICD-10-CM | POA: Diagnosis not present

## 2020-06-01 DIAGNOSIS — I480 Paroxysmal atrial fibrillation: Secondary | ICD-10-CM | POA: Diagnosis not present

## 2020-06-01 DIAGNOSIS — I70213 Atherosclerosis of native arteries of extremities with intermittent claudication, bilateral legs: Secondary | ICD-10-CM | POA: Diagnosis not present

## 2020-06-01 DIAGNOSIS — I639 Cerebral infarction, unspecified: Secondary | ICD-10-CM

## 2020-06-01 DIAGNOSIS — I69320 Aphasia following cerebral infarction: Secondary | ICD-10-CM | POA: Diagnosis not present

## 2020-06-01 DIAGNOSIS — Z5181 Encounter for therapeutic drug level monitoring: Secondary | ICD-10-CM | POA: Diagnosis not present

## 2020-06-01 LAB — POCT INR: INR: 2.7 (ref 2.0–3.0)

## 2020-06-01 NOTE — Patient Instructions (Signed)
Continue warfarin 1 tablet daily except 1 1/2 tablets on Sundays, Tuesdays and Thursdays.  Recheck in 2 weeks. Order given to Ernest Pine RN [facility] [phone]

## 2020-06-11 ENCOUNTER — Other Ambulatory Visit: Payer: Self-pay | Admitting: Cardiology

## 2020-06-12 ENCOUNTER — Telehealth: Payer: Self-pay | Admitting: Pharmacist

## 2020-06-12 NOTE — Telephone Encounter (Signed)
HH request okay to repeat INR in 3 weeks instead of 2 weeks.   Noted 3 out of 4 recent INRs at goal.   Okay weeks for INR repeat.

## 2020-06-18 ENCOUNTER — Telehealth: Payer: Self-pay

## 2020-06-18 NOTE — Telephone Encounter (Signed)
Pts sister reports pt having intermittent numbness and tingling in both calves and feet x several months after walking about 15-20 feet. Denies any swelling, pain, discoloration or sores. Pt is s/p Aortobifemoral bypass 01/28/19 and missed his postop appt. Advised will have scheduling contact to arrange ABI + LE arterial duplex and follow up Dr. Darrick Penna. Voiced understanding.

## 2020-06-23 ENCOUNTER — Ambulatory Visit (INDEPENDENT_AMBULATORY_CARE_PROVIDER_SITE_OTHER): Payer: Medicare HMO | Admitting: *Deleted

## 2020-06-23 DIAGNOSIS — Z5181 Encounter for therapeutic drug level monitoring: Secondary | ICD-10-CM | POA: Diagnosis not present

## 2020-06-23 DIAGNOSIS — I639 Cerebral infarction, unspecified: Secondary | ICD-10-CM

## 2020-06-23 DIAGNOSIS — I70213 Atherosclerosis of native arteries of extremities with intermittent claudication, bilateral legs: Secondary | ICD-10-CM | POA: Diagnosis not present

## 2020-06-23 DIAGNOSIS — I69320 Aphasia following cerebral infarction: Secondary | ICD-10-CM | POA: Diagnosis not present

## 2020-06-23 DIAGNOSIS — I480 Paroxysmal atrial fibrillation: Secondary | ICD-10-CM | POA: Diagnosis not present

## 2020-06-23 DIAGNOSIS — Z7901 Long term (current) use of anticoagulants: Secondary | ICD-10-CM | POA: Diagnosis not present

## 2020-06-23 DIAGNOSIS — I69351 Hemiplegia and hemiparesis following cerebral infarction affecting right dominant side: Secondary | ICD-10-CM | POA: Diagnosis not present

## 2020-06-23 LAB — POCT INR: INR: 2 (ref 2.0–3.0)

## 2020-06-23 NOTE — Patient Instructions (Signed)
Continue warfarin 1 tablet daily except 1 1/2 tablets on Sundays, Tuesdays and Thursdays.  Recheck in 2 weeks. Order given to [employer] [facility] [phone]

## 2020-06-29 ENCOUNTER — Other Ambulatory Visit: Payer: Self-pay | Admitting: *Deleted

## 2020-06-29 DIAGNOSIS — I779 Disorder of arteries and arterioles, unspecified: Secondary | ICD-10-CM

## 2020-06-29 DIAGNOSIS — I739 Peripheral vascular disease, unspecified: Secondary | ICD-10-CM

## 2020-06-30 ENCOUNTER — Ambulatory Visit (INDEPENDENT_AMBULATORY_CARE_PROVIDER_SITE_OTHER)
Admission: RE | Admit: 2020-06-30 | Discharge: 2020-06-30 | Disposition: A | Discharge status: Home / Self Care | Payer: Medicare HMO | Source: Ambulatory Visit | Attending: Vascular Surgery | Admitting: Vascular Surgery

## 2020-06-30 ENCOUNTER — Ambulatory Visit (HOSPITAL_COMMUNITY)
Admission: RE | Admit: 2020-06-30 | Discharge: 2020-06-30 | Disposition: A | Discharge status: Home / Self Care | Payer: Medicare HMO | Source: Ambulatory Visit | Attending: Vascular Surgery | Admitting: Vascular Surgery

## 2020-06-30 ENCOUNTER — Other Ambulatory Visit: Payer: Self-pay

## 2020-06-30 DIAGNOSIS — I739 Peripheral vascular disease, unspecified: Secondary | ICD-10-CM | POA: Diagnosis not present

## 2020-06-30 DIAGNOSIS — I779 Disorder of arteries and arterioles, unspecified: Secondary | ICD-10-CM | POA: Diagnosis not present

## 2020-07-02 ENCOUNTER — Ambulatory Visit (INDEPENDENT_AMBULATORY_CARE_PROVIDER_SITE_OTHER): Payer: Medicare HMO | Admitting: Vascular Surgery

## 2020-07-02 ENCOUNTER — Encounter: Payer: Self-pay | Admitting: Vascular Surgery

## 2020-07-02 ENCOUNTER — Other Ambulatory Visit: Payer: Self-pay

## 2020-07-02 VITALS — BP 130/86 | HR 72 | Temp 97.3°F | Resp 20 | Ht 76.0 in | Wt 203.0 lb

## 2020-07-02 DIAGNOSIS — G609 Hereditary and idiopathic neuropathy, unspecified: Secondary | ICD-10-CM | POA: Diagnosis not present

## 2020-07-02 DIAGNOSIS — I739 Peripheral vascular disease, unspecified: Secondary | ICD-10-CM

## 2020-07-02 MED ORDER — GABAPENTIN 300 MG PO CAPS: 300.0000 mg | ORAL_CAPSULE | Freq: Every day | ORAL | 10 refills | Status: AC

## 2020-07-02 NOTE — Progress Notes (Signed)
[name] [name] is a 69 year old male who was seen today for bilateral foot numbness and tingling.  It has been present for several months.  He has a previous peripheral arterial disease history of aortobifemoral bypass and prior bilateral popliteal embolectomies.  These were all secondary to atrial fibrillation events.  Currently remains on warfarin.  He states he has been compliant with this.  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  He currently is on aspirin and a statin.  He describes the pain as a burning sensation in the toes and foot bilaterally.  He does not describe any claudication symptoms.  He also has noted a slight bulge at his previous aortobifemoral bypass incision.  Review of systems: He has no shortness of breath.  He has no chest pain.  He does still have problems with speech from his prior stroke.  Current Outpatient Medications on File Prior to Visit  Medication Sig Dispense Refill  . aspirin EC 81 MG tablet Take 1 tablet (81 mg total) by mouth daily. 90 tablet 0  . metoprolol tartrate (LOPRESSOR) 25 MG tablet TAKE ONE TABLET (25MG  TOTAL) BY MOUTH TWO TIMES DAILY 180 tablet 1  . warfarin (COUMADIN) 5 MG tablet Take 1 to 1.5 tablets by mouth daily as directed 120 tablet 3  . atorvastatin (LIPITOR) 20 MG tablet Take 1 tablet (20 mg total) by mouth daily. 90 tablet 3   No current facility-administered medications on file prior to visit.    Past Medical History:  Diagnosis Date  . Afib (HCC)    a. Paroxysmal --> previous issues with bradycardia while on Amiodarone and he refused PPM placement. Rate-control strategy pursued.   . Aortic stenosis   . Chronic kidney disease    unknown problem to see kidney  doctor in March in [place]  . Dysrhythmia    Atril Fib  . Peripheral vascular disease (HCC)   . Stroke [facility])    Aphasia, right sided weakness     Past Surgical History:  Procedure Laterality Date  . AORTA - BILATERAL FEMORAL ARTERY BYPASS GRAFT  Bilateral 01/28/2019   Procedure: AORTA BIFEMORAL BYPASS GRAFT;  Surgeon: 03/29/2019, MD;  Location: [facility] OR;  Service: Vascular;  Laterality: Bilateral;  . AORTA BIFEMORAL BYPASS GRAFT  01/28/2019  . DIALYSIS/PERMA CATHETER INSERTION N/A 02/15/2019   Procedure: DIALYSIS/PERMA CATHETER INSERTION;  Surgeon: 02/17/2019, MD;  Location: [facility] INVASIVE CV LAB;  Service: Cardiovascular;  Laterality: N/A;  . EMBOLECTOMY Right 08/30/2017   Procedure: Right popliteal tibial endarectomy with patch angioplasty;  Surgeon: 10/30/2017, MD;  Location: [facility] OR;  Service: Vascular;  Laterality: Right;  . ENDARTERECTOMY FEMORAL Bilateral 01/28/2019   Procedure: BILATERAL FEMORAL ENDARTERECTOMY WITH PROFUNDAPLASTY;  Surgeon: 03/29/2019, MD;  Location: [facility] OR;  Service: Vascular;  Laterality: Bilateral;  . FASCIOTOMY CLOSURE Bilateral 08/28/2017   Procedure: FASCIOTOMY CLOSURE/BILATERAL  lower legs;  Surgeon: 10/28/2017, MD;  Location: [facility] OR;  Service: Vascular;  Laterality: Bilateral;  . FEMORAL-POPLITEAL BYPASS GRAFT Bilateral 08/26/2017   Procedure: Bilateral FEMORAL EMBOLECTOMY with  BILATERAL Four Compartment  FASCIOTOMIES.;  Surgeon: 10/26/2017, MD;  Location: [facility] OR;  Service: Vascular;  Laterality: Bilateral;  . FEMORAL-POPLITEAL BYPASS GRAFT Bilateral 01/28/2019   Procedure: BILATERAL  ILIAC THROMBECTOMY;  Surgeon: 03/29/2019, MD;  Location: [facility] OR;  Service: Vascular;  Laterality: Bilateral;    Physical exam:  Vitals:   07/02/20 1010  BP: 130/86  Pulse: 72  Resp: 20  Temp: (!) 97.3 F (36.3 C)  SpO2: 96%  Weight: 203 lb (92.1 kg)  Height: 6\' 4"  (1.93 m)    Extremities: 2+ femoral pulses absent pedal pulses bilaterally small laceration between toes 1 and 2 on the right foot [name] [name] states he cut this earlier today trimming his nails.  There is active bleeding but it is oozing only.  Abdomen: There is some thinning of the fascia but no obvious hernia defect or of a  diastases in the upper portion of the abdomen.  Data: [name] [name] had bilateral ABIs performed on June 30, 2020 as well as lower extremity arterial duplex.  This showed that the aortobifemoral bypass was patent.  ABIs were 0.75 on the right 0.7 on the left.  These are improved from right side 0.3 and left side 0 preoperatively in January 2020.  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  He has adequate perfusion to both feet.  He does have evidence of lower extremity occlusive disease most likely secondary to his prior embolic events.  He currently has no claudication symptoms.  He does not have limb threatening ischemia.  His aortobifemoral bypass is patent.  Plan: The [name] [name] was started on Neurontin 300 mg once a day to see if we can improve his neuropathy symptoms.  He will follow up with February 2020 in our APP clinic with bilateral ABIs in 1 year.  [place], MD [facility] [place] Office: [phone]

## 2020-07-02 NOTE — Progress Notes (Deleted)
Patient name: [name] [name] MRN: [id number] DOB: 04/05/1951 Sex: male  HPI: [name] [name] is a 69 y.o. male, who is status post bilateral popliteal embolectomies and prior aortobifemoral bypass.  These were all secondary to atrial fibrillation events.  The patient states he has been compliant with his warfarin.  He recently has started having numbness burning and tingling in his feet.  He does not describe claudication.  He does not have rest pain.  He does not have any nonhealing wounds.  Other medical problems include prior stroke and atrial fibrillation.  These are both currently stable.  Past Medical History:  Diagnosis Date  . Afib (HCC)    a. Paroxysmal --> previous issues with bradycardia while on Amiodarone and he refused PPM placement. Rate-control strategy pursued.   . Aortic stenosis   . Chronic kidney disease    unknown problem to see kidney  doctor in March in [place]  . Dysrhythmia    Atril Fib  . Peripheral vascular disease (HCC)   . Stroke [facility])    Aphasia, right sided weakness    Past Surgical History:  Procedure Laterality Date  . AORTA - BILATERAL FEMORAL ARTERY BYPASS GRAFT Bilateral 01/28/2019   Procedure: AORTA BIFEMORAL BYPASS GRAFT;  Surgeon: Sherren Kerns, MD;  Location: [facility] OR;  Service: Vascular;  Laterality: Bilateral;  . AORTA BIFEMORAL BYPASS GRAFT  01/28/2019  . DIALYSIS/PERMA CATHETER INSERTION N/A 02/15/2019   Procedure: DIALYSIS/PERMA CATHETER INSERTION;  Surgeon: Sherren Kerns, MD;  Location: [facility] INVASIVE CV LAB;  Service: Cardiovascular;  Laterality: N/A;  . EMBOLECTOMY Right 08/30/2017   Procedure: Right popliteal tibial endarectomy with patch angioplasty;  Surgeon: Sherren Kerns, MD;  Location: [facility] OR;  Service: Vascular;  Laterality: Right;  . ENDARTERECTOMY FEMORAL Bilateral 01/28/2019   Procedure: BILATERAL FEMORAL ENDARTERECTOMY WITH PROFUNDAPLASTY;  Surgeon: Sherren Kerns, MD;  Location: [facility] OR;  Service: Vascular;   Laterality: Bilateral;  . FASCIOTOMY CLOSURE Bilateral 08/28/2017   Procedure: FASCIOTOMY CLOSURE/BILATERAL  lower legs;  Surgeon: Sherren Kerns, MD;  Location: [facility] OR;  Service: Vascular;  Laterality: Bilateral;  . FEMORAL-POPLITEAL BYPASS GRAFT Bilateral 08/26/2017   Procedure: Bilateral FEMORAL EMBOLECTOMY with  BILATERAL Four Compartment  FASCIOTOMIES.;  Surgeon: Sherren Kerns, MD;  Location: [facility] OR;  Service: Vascular;  Laterality: Bilateral;  . FEMORAL-POPLITEAL BYPASS GRAFT Bilateral 01/28/2019   Procedure: BILATERAL  ILIAC THROMBECTOMY;  Surgeon: Sherren Kerns, MD;  Location: [facility] OR;  Service: Vascular;  Laterality: Bilateral;    Family History  Problem Relation Age of Onset  . Stroke Sister     SOCIAL HISTORY: Social History   Socioeconomic History  . Marital status: Single    Spouse name: Not on file  . Number of children: Not on file  . Years of education: Not on file  . Highest education level: Not on file  Occupational History  . Not on file  Tobacco Use  . Smoking status: Never Smoker  . Smokeless tobacco: Never Used  Vaping Use  . Vaping Use: Never used  Substance and Sexual Activity  . Alcohol use: No  . Drug use: No  . Sexual activity: Not on file  Other Topics Concern  . Not on file  Social History Narrative  . Not on file   Social Determinants of Health   Financial Resource Strain:   . Difficulty of Paying Living Expenses:   Food Insecurity:   . Worried About Programme researcher, broadcasting/film/video in the Last Year:   .  Ran Out of Food in the Last Year:   Transportation Needs:   . Freight forwarder (Medical):   Marland Kitchen Lack of Transportation (Non-Medical):   Physical Activity:   . Days of Exercise per Week:   . Minutes of Exercise per Session:   Stress:   . Feeling of Stress :   Social Connections:   . Frequency of Communication with Friends and Family:   . Frequency of Social Gatherings with Friends and Family:   . Attends Religious Services:   . Active  Member of Clubs or Organizations:   . Attends Banker Meetings:   Marland Kitchen Marital Status:   Intimate Partner Violence:   . Fear of Current or Ex-Partner:   . Emotionally Abused:   Marland Kitchen Physically Abused:   . Sexually Abused:     No Known Allergies  Current Outpatient Medications  Medication Sig Dispense Refill  . aspirin EC 81 MG tablet Take 1 tablet (81 mg total) by mouth daily. 90 tablet 0  . metoprolol tartrate (LOPRESSOR) 25 MG tablet TAKE ONE TABLET (25MG  TOTAL) BY MOUTH TWO TIMES DAILY 180 tablet 1  . warfarin (COUMADIN) 5 MG tablet Take 1 to 1.5 tablets by mouth daily as directed 120 tablet 3  . atorvastatin (LIPITOR) 20 MG tablet Take 1 tablet (20 mg total) by mouth daily. 90 tablet 3   No current facility-administered medications for this visit.    ROS:   General:  No weight loss, Fever, chills  HEENT: No recent headaches, no nasal bleeding, no visual changes, no sore throat  Neurologic: No dizziness, blackouts, seizures. No recent symptoms of stroke or mini- stroke. No recent episodes of slurred speech, or temporary blindness.  Cardiac: No recent episodes of chest pain/pressure, no shortness of breath at rest.  No shortness of breath with exertion.  Denies history of atrial fibrillation or irregular heartbeat  Vascular: No history of rest pain in feet.  No history of claudication.  No history of non-healing ulcer, No history of DVT   Pulmonary: No home oxygen, no productive cough, no hemoptysis,  No asthma or wheezing   Physical Examination  Vitals:   07/02/20 1010  BP: 130/86  Pulse: 72  Resp: 20  Temp: (!) 97.3 F (36.3 C)  SpO2: 96%  Weight: 203 lb (92.1 kg)  Height: 6\' 4"  (1.93 m)    Body mass index is 24.71 kg/m.  General:  Alert and oriented, no acute distress HEENT: Normal Neck: No bruit or JVD Pulmonary: Clear to auscultation bilaterally Cardiac: Regular Rate and Rhythm without murmur Abdomen: Soft, non-tender, non-distended, no mass,  no scars Skin: No rash Extremity Pulses:  2+ radial, brachial, femoral, dorsalis pedis, posterior tibial pulses bilaterally Musculoskeletal: No deformity or edema  Neurologic: Upper and lower extremity motor 5/5 and symmetric  DATA:  ***  ASSESSMENT:  ***   PLAN:  ***   07/04/20, MD [facility] [place] Office: [phone] Pager: [phone]

## 2020-07-09 ENCOUNTER — Ambulatory Visit (INDEPENDENT_AMBULATORY_CARE_PROVIDER_SITE_OTHER): Payer: Medicare HMO | Admitting: Cardiology

## 2020-07-09 ENCOUNTER — Telehealth: Payer: Self-pay | Admitting: Cardiology

## 2020-07-09 DIAGNOSIS — I70213 Atherosclerosis of native arteries of extremities with intermittent claudication, bilateral legs: Secondary | ICD-10-CM | POA: Diagnosis not present

## 2020-07-09 DIAGNOSIS — Z7901 Long term (current) use of anticoagulants: Secondary | ICD-10-CM | POA: Diagnosis not present

## 2020-07-09 DIAGNOSIS — I69320 Aphasia following cerebral infarction: Secondary | ICD-10-CM | POA: Diagnosis not present

## 2020-07-09 DIAGNOSIS — I480 Paroxysmal atrial fibrillation: Secondary | ICD-10-CM | POA: Diagnosis not present

## 2020-07-09 DIAGNOSIS — I639 Cerebral infarction, unspecified: Secondary | ICD-10-CM

## 2020-07-09 DIAGNOSIS — Z5181 Encounter for therapeutic drug level monitoring: Secondary | ICD-10-CM

## 2020-07-09 DIAGNOSIS — I69351 Hemiplegia and hemiparesis following cerebral infarction affecting right dominant side: Secondary | ICD-10-CM | POA: Diagnosis not present

## 2020-07-09 LAB — POCT INR: INR: 4 — AB (ref 2.0–3.0)

## 2020-07-09 NOTE — Telephone Encounter (Signed)
See anticoagulation note 

## 2020-07-09 NOTE — Telephone Encounter (Signed)
Lisa from [facility] calling with INR results.

## 2020-07-14 DIAGNOSIS — I480 Paroxysmal atrial fibrillation: Secondary | ICD-10-CM | POA: Diagnosis not present

## 2020-07-14 DIAGNOSIS — Z7901 Long term (current) use of anticoagulants: Secondary | ICD-10-CM | POA: Diagnosis not present

## 2020-07-14 DIAGNOSIS — I69351 Hemiplegia and hemiparesis following cerebral infarction affecting right dominant side: Secondary | ICD-10-CM | POA: Diagnosis not present

## 2020-07-14 DIAGNOSIS — I70213 Atherosclerosis of native arteries of extremities with intermittent claudication, bilateral legs: Secondary | ICD-10-CM | POA: Diagnosis not present

## 2020-07-14 DIAGNOSIS — I69320 Aphasia following cerebral infarction: Secondary | ICD-10-CM | POA: Diagnosis not present

## 2020-07-14 DIAGNOSIS — Z5181 Encounter for therapeutic drug level monitoring: Secondary | ICD-10-CM | POA: Diagnosis not present

## 2020-07-15 ENCOUNTER — Ambulatory Visit (INDEPENDENT_AMBULATORY_CARE_PROVIDER_SITE_OTHER): Payer: Medicare HMO | Admitting: *Deleted

## 2020-07-15 DIAGNOSIS — Z5181 Encounter for therapeutic drug level monitoring: Secondary | ICD-10-CM

## 2020-07-15 DIAGNOSIS — I639 Cerebral infarction, unspecified: Secondary | ICD-10-CM

## 2020-07-15 LAB — POCT INR: INR: 1.7 — AB (ref 2.0–3.0)

## 2020-07-15 NOTE — Patient Instructions (Signed)
Increase warfarin to 1 tablet daily except 1 1/2 tablets on Sundays, Tuesdays and Thursdays.  Recheck in 1 week.  Order given to Delena Serve RN [facility]

## 2020-07-23 ENCOUNTER — Ambulatory Visit (INDEPENDENT_AMBULATORY_CARE_PROVIDER_SITE_OTHER): Payer: Medicare HMO | Admitting: *Deleted

## 2020-07-23 DIAGNOSIS — I480 Paroxysmal atrial fibrillation: Secondary | ICD-10-CM | POA: Diagnosis not present

## 2020-07-23 DIAGNOSIS — I639 Cerebral infarction, unspecified: Secondary | ICD-10-CM

## 2020-07-23 DIAGNOSIS — Z5181 Encounter for therapeutic drug level monitoring: Secondary | ICD-10-CM

## 2020-07-23 DIAGNOSIS — I69351 Hemiplegia and hemiparesis following cerebral infarction affecting right dominant side: Secondary | ICD-10-CM | POA: Diagnosis not present

## 2020-07-23 DIAGNOSIS — I70213 Atherosclerosis of native arteries of extremities with intermittent claudication, bilateral legs: Secondary | ICD-10-CM | POA: Diagnosis not present

## 2020-07-23 DIAGNOSIS — I69320 Aphasia following cerebral infarction: Secondary | ICD-10-CM | POA: Diagnosis not present

## 2020-07-23 DIAGNOSIS — Z7901 Long term (current) use of anticoagulants: Secondary | ICD-10-CM | POA: Diagnosis not present

## 2020-07-23 LAB — POCT INR: INR: 2.6 (ref 2.0–3.0)

## 2020-07-23 NOTE — Patient Instructions (Signed)
Continue warfarin 1 tablet daily except 1 1/2 tablets on Sundays, Tuesdays and Thursdays.  Recheck in 2 weeks.  Order given to [employer] [facility]

## 2020-08-03 ENCOUNTER — Ambulatory Visit (INDEPENDENT_AMBULATORY_CARE_PROVIDER_SITE_OTHER): Payer: Medicare HMO | Admitting: *Deleted

## 2020-08-03 DIAGNOSIS — I639 Cerebral infarction, unspecified: Secondary | ICD-10-CM

## 2020-08-03 DIAGNOSIS — I69320 Aphasia following cerebral infarction: Secondary | ICD-10-CM | POA: Diagnosis not present

## 2020-08-03 DIAGNOSIS — Z5181 Encounter for therapeutic drug level monitoring: Secondary | ICD-10-CM | POA: Diagnosis not present

## 2020-08-03 DIAGNOSIS — I69351 Hemiplegia and hemiparesis following cerebral infarction affecting right dominant side: Secondary | ICD-10-CM | POA: Diagnosis not present

## 2020-08-03 DIAGNOSIS — Z7901 Long term (current) use of anticoagulants: Secondary | ICD-10-CM | POA: Diagnosis not present

## 2020-08-03 DIAGNOSIS — I480 Paroxysmal atrial fibrillation: Secondary | ICD-10-CM | POA: Diagnosis not present

## 2020-08-03 DIAGNOSIS — I70213 Atherosclerosis of native arteries of extremities with intermittent claudication, bilateral legs: Secondary | ICD-10-CM | POA: Diagnosis not present

## 2020-08-03 LAB — POCT INR: INR: 3.1 — AB (ref 2.0–3.0)

## 2020-08-03 NOTE — Patient Instructions (Signed)
Continue warfarin 1 tablet daily except 1 1/2 tablets on Sundays, Tuesdays and Thursdays.   Increase salads/greens Recheck in 1 weeks.  Order given to Malen Gauze RN [facility]

## 2020-08-11 ENCOUNTER — Ambulatory Visit (INDEPENDENT_AMBULATORY_CARE_PROVIDER_SITE_OTHER): Payer: Medicare HMO | Admitting: *Deleted

## 2020-08-11 DIAGNOSIS — Z5181 Encounter for therapeutic drug level monitoring: Secondary | ICD-10-CM | POA: Diagnosis not present

## 2020-08-11 DIAGNOSIS — I70213 Atherosclerosis of native arteries of extremities with intermittent claudication, bilateral legs: Secondary | ICD-10-CM | POA: Diagnosis not present

## 2020-08-11 DIAGNOSIS — I480 Paroxysmal atrial fibrillation: Secondary | ICD-10-CM | POA: Diagnosis not present

## 2020-08-11 DIAGNOSIS — I69351 Hemiplegia and hemiparesis following cerebral infarction affecting right dominant side: Secondary | ICD-10-CM | POA: Diagnosis not present

## 2020-08-11 DIAGNOSIS — Z7901 Long term (current) use of anticoagulants: Secondary | ICD-10-CM | POA: Diagnosis not present

## 2020-08-11 DIAGNOSIS — I69320 Aphasia following cerebral infarction: Secondary | ICD-10-CM | POA: Diagnosis not present

## 2020-08-11 LAB — POCT INR: INR: 3.8 — AB (ref 2.0–3.0)

## 2020-08-11 NOTE — Patient Instructions (Signed)
Hold warfarin tonight then decrease dose to 1 tablet daily except 1 1/2 tablets on Sundays  Continue salads/greens Recheck in 1 week Order given to Malen Gauze RN [facility]

## 2020-08-17 ENCOUNTER — Telehealth: Payer: Self-pay | Admitting: *Deleted

## 2020-08-17 ENCOUNTER — Ambulatory Visit (INDEPENDENT_AMBULATORY_CARE_PROVIDER_SITE_OTHER): Payer: Medicare HMO | Admitting: *Deleted

## 2020-08-17 DIAGNOSIS — I69320 Aphasia following cerebral infarction: Secondary | ICD-10-CM | POA: Diagnosis not present

## 2020-08-17 DIAGNOSIS — Z7901 Long term (current) use of anticoagulants: Secondary | ICD-10-CM | POA: Diagnosis not present

## 2020-08-17 DIAGNOSIS — I70213 Atherosclerosis of native arteries of extremities with intermittent claudication, bilateral legs: Secondary | ICD-10-CM | POA: Diagnosis not present

## 2020-08-17 DIAGNOSIS — Z5181 Encounter for therapeutic drug level monitoring: Secondary | ICD-10-CM

## 2020-08-17 DIAGNOSIS — I639 Cerebral infarction, unspecified: Secondary | ICD-10-CM | POA: Diagnosis not present

## 2020-08-17 DIAGNOSIS — I480 Paroxysmal atrial fibrillation: Secondary | ICD-10-CM | POA: Diagnosis not present

## 2020-08-17 DIAGNOSIS — I69351 Hemiplegia and hemiparesis following cerebral infarction affecting right dominant side: Secondary | ICD-10-CM | POA: Diagnosis not present

## 2020-08-17 LAB — POCT INR: INR: 2.3 (ref 2.0–3.0)

## 2020-08-17 NOTE — Telephone Encounter (Signed)
Per Marisue Ivan w/ [facility] [phone]  INR 2.3

## 2020-08-17 NOTE — Patient Instructions (Signed)
Continue warfarin 1 tablet daily except 1 1/2 tablets on Sundays  Continue salads/greens Recheck in 2 weeks Order given to [employer] [facility]

## 2020-08-17 NOTE — Telephone Encounter (Signed)
Done.  See coumadin note. 

## 2020-09-02 ENCOUNTER — Ambulatory Visit (INDEPENDENT_AMBULATORY_CARE_PROVIDER_SITE_OTHER): Payer: Medicare HMO | Admitting: *Deleted

## 2020-09-02 DIAGNOSIS — I480 Paroxysmal atrial fibrillation: Secondary | ICD-10-CM | POA: Diagnosis not present

## 2020-09-02 DIAGNOSIS — Z7901 Long term (current) use of anticoagulants: Secondary | ICD-10-CM | POA: Diagnosis not present

## 2020-09-02 DIAGNOSIS — I69320 Aphasia following cerebral infarction: Secondary | ICD-10-CM | POA: Diagnosis not present

## 2020-09-02 DIAGNOSIS — I70213 Atherosclerosis of native arteries of extremities with intermittent claudication, bilateral legs: Secondary | ICD-10-CM | POA: Diagnosis not present

## 2020-09-02 DIAGNOSIS — Z5181 Encounter for therapeutic drug level monitoring: Secondary | ICD-10-CM | POA: Diagnosis not present

## 2020-09-02 DIAGNOSIS — I69351 Hemiplegia and hemiparesis following cerebral infarction affecting right dominant side: Secondary | ICD-10-CM | POA: Diagnosis not present

## 2020-09-02 LAB — POCT INR: INR: 1.7 — AB (ref 2.0–3.0)

## 2020-09-02 NOTE — Patient Instructions (Signed)
Increase warfarin to 1 tablet daily except 1 1/2 tablets on M,W,F Continue salads/greens Recheck in 1 weeks Order given to Malen Gauze RN [facility]

## 2020-09-08 ENCOUNTER — Ambulatory Visit (INDEPENDENT_AMBULATORY_CARE_PROVIDER_SITE_OTHER): Payer: Medicare HMO | Admitting: *Deleted

## 2020-09-08 DIAGNOSIS — Z5181 Encounter for therapeutic drug level monitoring: Secondary | ICD-10-CM | POA: Diagnosis not present

## 2020-09-08 DIAGNOSIS — I69320 Aphasia following cerebral infarction: Secondary | ICD-10-CM | POA: Diagnosis not present

## 2020-09-08 DIAGNOSIS — I70213 Atherosclerosis of native arteries of extremities with intermittent claudication, bilateral legs: Secondary | ICD-10-CM | POA: Diagnosis not present

## 2020-09-08 DIAGNOSIS — I69351 Hemiplegia and hemiparesis following cerebral infarction affecting right dominant side: Secondary | ICD-10-CM | POA: Diagnosis not present

## 2020-09-08 DIAGNOSIS — I480 Paroxysmal atrial fibrillation: Secondary | ICD-10-CM | POA: Diagnosis not present

## 2020-09-08 DIAGNOSIS — Z7901 Long term (current) use of anticoagulants: Secondary | ICD-10-CM | POA: Diagnosis not present

## 2020-09-08 LAB — POCT INR: INR: 1.9 — AB (ref 2.0–3.0)

## 2020-09-08 NOTE — Patient Instructions (Signed)
Increase warfarin to 1 1/2 tablets daily except 1 tablet on Sundays, Tuesdays and Thursdays Continue salads/greens Recheck in 1 week Order given to Malen Gauze RN [facility]

## 2020-09-16 ENCOUNTER — Ambulatory Visit (INDEPENDENT_AMBULATORY_CARE_PROVIDER_SITE_OTHER): Payer: Medicare HMO | Admitting: *Deleted

## 2020-09-16 DIAGNOSIS — Z5181 Encounter for therapeutic drug level monitoring: Secondary | ICD-10-CM

## 2020-09-16 DIAGNOSIS — I70213 Atherosclerosis of native arteries of extremities with intermittent claudication, bilateral legs: Secondary | ICD-10-CM | POA: Diagnosis not present

## 2020-09-16 DIAGNOSIS — I639 Cerebral infarction, unspecified: Secondary | ICD-10-CM | POA: Diagnosis not present

## 2020-09-16 DIAGNOSIS — I69320 Aphasia following cerebral infarction: Secondary | ICD-10-CM | POA: Diagnosis not present

## 2020-09-16 DIAGNOSIS — I480 Paroxysmal atrial fibrillation: Secondary | ICD-10-CM | POA: Diagnosis not present

## 2020-09-16 DIAGNOSIS — Z7901 Long term (current) use of anticoagulants: Secondary | ICD-10-CM | POA: Diagnosis not present

## 2020-09-16 DIAGNOSIS — I69351 Hemiplegia and hemiparesis following cerebral infarction affecting right dominant side: Secondary | ICD-10-CM | POA: Diagnosis not present

## 2020-09-16 LAB — POCT INR: INR: 3.2 — AB (ref 2.0–3.0)

## 2020-09-16 NOTE — Patient Instructions (Signed)
Took warfarin this morning Take warfarin 1/2 tablet tomorrow then decrease dose to 1 tablet daily except 1 1/2 tablets on Mondays. Wednesdays and Fridays Continue salads/greens Recheck in 1 week Order given to [employer]

## 2020-09-23 ENCOUNTER — Ambulatory Visit (INDEPENDENT_AMBULATORY_CARE_PROVIDER_SITE_OTHER): Payer: Medicare HMO | Admitting: *Deleted

## 2020-09-23 DIAGNOSIS — Z7901 Long term (current) use of anticoagulants: Secondary | ICD-10-CM | POA: Diagnosis not present

## 2020-09-23 DIAGNOSIS — I70213 Atherosclerosis of native arteries of extremities with intermittent claudication, bilateral legs: Secondary | ICD-10-CM | POA: Diagnosis not present

## 2020-09-23 DIAGNOSIS — Z5181 Encounter for therapeutic drug level monitoring: Secondary | ICD-10-CM

## 2020-09-23 DIAGNOSIS — I480 Paroxysmal atrial fibrillation: Secondary | ICD-10-CM | POA: Diagnosis not present

## 2020-09-23 DIAGNOSIS — I639 Cerebral infarction, unspecified: Secondary | ICD-10-CM

## 2020-09-23 DIAGNOSIS — I69351 Hemiplegia and hemiparesis following cerebral infarction affecting right dominant side: Secondary | ICD-10-CM | POA: Diagnosis not present

## 2020-09-23 DIAGNOSIS — I69320 Aphasia following cerebral infarction: Secondary | ICD-10-CM | POA: Diagnosis not present

## 2020-09-23 LAB — POCT INR: INR: 2.6 (ref 2.0–3.0)

## 2020-09-23 NOTE — Patient Instructions (Signed)
Took warfarin this morning Continue warfarin 1 tablet daily except 1 1/2 tablets on Mondays. Wednesdays and Fridays Continue salads/greens Recheck in 1 week Order given to [employer]

## 2020-09-30 ENCOUNTER — Telehealth: Payer: Self-pay | Admitting: *Deleted

## 2020-09-30 ENCOUNTER — Ambulatory Visit (INDEPENDENT_AMBULATORY_CARE_PROVIDER_SITE_OTHER): Payer: Medicare HMO | Admitting: *Deleted

## 2020-09-30 DIAGNOSIS — I70213 Atherosclerosis of native arteries of extremities with intermittent claudication, bilateral legs: Secondary | ICD-10-CM | POA: Diagnosis not present

## 2020-09-30 DIAGNOSIS — Z5181 Encounter for therapeutic drug level monitoring: Secondary | ICD-10-CM | POA: Diagnosis not present

## 2020-09-30 DIAGNOSIS — I69351 Hemiplegia and hemiparesis following cerebral infarction affecting right dominant side: Secondary | ICD-10-CM | POA: Diagnosis not present

## 2020-09-30 DIAGNOSIS — I480 Paroxysmal atrial fibrillation: Secondary | ICD-10-CM | POA: Diagnosis not present

## 2020-09-30 DIAGNOSIS — I69320 Aphasia following cerebral infarction: Secondary | ICD-10-CM | POA: Diagnosis not present

## 2020-09-30 DIAGNOSIS — Z7901 Long term (current) use of anticoagulants: Secondary | ICD-10-CM | POA: Diagnosis not present

## 2020-09-30 LAB — POCT INR: INR: 2.6 (ref 2.0–3.0)

## 2020-09-30 NOTE — Patient Instructions (Signed)
Took warfarin this morning Continue warfarin 1 tablet daily except 1 1/2 tablets on Mondays. Wednesdays and Fridays Continue salads/greens Recheck in 2 weeks Order given to [employer] [facility]

## 2020-09-30 NOTE — Telephone Encounter (Signed)
INR 2.6  [facility] [phone]

## 2020-10-13 ENCOUNTER — Telehealth: Payer: Self-pay | Admitting: *Deleted

## 2020-10-13 ENCOUNTER — Ambulatory Visit (INDEPENDENT_AMBULATORY_CARE_PROVIDER_SITE_OTHER): Payer: Medicare HMO | Admitting: *Deleted

## 2020-10-13 DIAGNOSIS — I639 Cerebral infarction, unspecified: Secondary | ICD-10-CM

## 2020-10-13 DIAGNOSIS — Z5181 Encounter for therapeutic drug level monitoring: Secondary | ICD-10-CM

## 2020-10-13 DIAGNOSIS — I480 Paroxysmal atrial fibrillation: Secondary | ICD-10-CM | POA: Diagnosis not present

## 2020-10-13 DIAGNOSIS — Z7901 Long term (current) use of anticoagulants: Secondary | ICD-10-CM | POA: Diagnosis not present

## 2020-10-13 DIAGNOSIS — I70213 Atherosclerosis of native arteries of extremities with intermittent claudication, bilateral legs: Secondary | ICD-10-CM | POA: Diagnosis not present

## 2020-10-13 DIAGNOSIS — I69320 Aphasia following cerebral infarction: Secondary | ICD-10-CM | POA: Diagnosis not present

## 2020-10-13 DIAGNOSIS — I69351 Hemiplegia and hemiparesis following cerebral infarction affecting right dominant side: Secondary | ICD-10-CM | POA: Diagnosis not present

## 2020-10-13 LAB — POCT INR: INR: 3 (ref 2.0–3.0)

## 2020-10-13 NOTE — Telephone Encounter (Signed)
INR RESULTS - 3.0  Please call Abby with [facility] [phone]

## 2020-10-13 NOTE — Patient Instructions (Signed)
Took warfarin this morning Continue warfarin 1 tablet daily except 1 1/2 tablets on Mondays. Wednesdays and Fridays Continue salads/greens Recheck in 2 weeks Order given to [employer] [facility]

## 2020-10-13 NOTE — Telephone Encounter (Signed)
Done.  See coumadin note. 

## 2020-11-05 ENCOUNTER — Telehealth: Payer: Self-pay

## 2020-11-05 NOTE — Telephone Encounter (Signed)
Patients sister called to request INR reverification paperwork. Per Doyne Keel, I directed her to call PCP.

## 2020-11-26 ENCOUNTER — Telehealth: Payer: Self-pay

## 2020-11-26 NOTE — Telephone Encounter (Signed)
Unable to lmom for overdue inr 

## 2021-01-11 ENCOUNTER — Other Ambulatory Visit: Payer: Self-pay | Admitting: Cardiology

## 2021-01-28 ENCOUNTER — Other Ambulatory Visit: Payer: Self-pay

## 2021-01-28 ENCOUNTER — Telehealth: Payer: Self-pay

## 2021-01-28 ENCOUNTER — Ambulatory Visit (HOSPITAL_COMMUNITY)
Admission: RE | Admit: 2021-01-28 | Discharge: 2021-01-28 | Disposition: A | Discharge status: Home / Self Care | Payer: Medicare HMO | Source: Ambulatory Visit | Attending: Physician Assistant | Admitting: Physician Assistant

## 2021-01-28 ENCOUNTER — Ambulatory Visit (INDEPENDENT_AMBULATORY_CARE_PROVIDER_SITE_OTHER): Payer: Medicare HMO | Admitting: Physician Assistant

## 2021-01-28 VITALS — BP 114/70 | HR 66 | Temp 98.5°F | Resp 20 | Ht 76.0 in | Wt 210.6 lb

## 2021-01-28 DIAGNOSIS — I739 Peripheral vascular disease, unspecified: Secondary | ICD-10-CM | POA: Diagnosis not present

## 2021-01-28 DIAGNOSIS — I4891 Unspecified atrial fibrillation: Secondary | ICD-10-CM | POA: Diagnosis not present

## 2021-01-28 NOTE — Progress Notes (Signed)
HISTORY AND PHYSICAL     CC:  follow up. Requesting Provider:  Toma Deiters, MD  HPI: This is a 70 y.o. male who is here today for follow up for PAD.  He is a pt of Dr. Darrick Penna and has hx of ABF bypass with bilateral CFA endarterectomy, profundoplasty and bilateral iliac thrombectomy in February 2020 and this was done for bilateral rest pain.  Prior to that, he ahd bilateral embolectomy of iliac, femoral, popliteal arteries and 4 compartment fasciotomies also by Dr. Darrick Penna on 08/26/2017.   These events were secondary to atrial fibrillation events.  He has been on warfarin.  Pt also had embolic stroke secondary to his afib.    Pt was last seen July 2021 and at that time, he was seen for bilateral foot numbness and tingling that was present for several months.   He described this as a burning sensation in the toes and feet bilaterally.  He did not have any claudication sx.  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  He had adequate perfusion in both feet but did have some evidence of lower extremity occlusive disease most likely due to prior embolic events.  He did not have limb threatening ischemia.  He was started on Neurontin 300mg  daily to see if he could get some relief. He was scheduled to f/u in one year with ABI.  ABI on the right was 0.75 (M) and 0.7 (M) on the left.   The pt walked into office today with complaints of bilateral leg tingling and numbness.  He was scheduled for ABI and to be seen.  He does have some speech issues and difficult to understand due to previous stroke.  Upon talking with pt, he has continued complaints of numbness and tingling from mid calf down to his feet.  When asked, he says it has not changed, it has not gotten worse or better with the gabapentin, which he is taking bid.  He does not have any non healing wounds.  He denies any cramping in his calves or legs when he walks.   He has not had any new stroke issues, specifically unilateral weakness, new numbness, paralysis or temporary blindness.   Upon review of his chart, he has not had an INR check since November.  He did have his medications that he takes in the car, so he got them and he is taking metoprolol, coumadin, gabapentin and lipitor.  He tells me he is taking these daily.    The pt is on a statin for cholesterol management.    The pt is on an aspirin.    Other AC:  coumadin The pt is on BB for hypertension.  The pt does not have diabetes. Tobacco hx:  never    Past Medical History:  Diagnosis Date  . Afib (HCC)    a. Paroxysmal --> previous issues with bradycardia while on Amiodarone and he refused PPM placement. Rate-control strategy pursued.   . Aortic stenosis   . Chronic kidney disease    unknown problem to see kidney  doctor in March in [place]  . Dysrhythmia    Atril Fib  . Peripheral vascular disease (HCC)   . Stroke [facility])    Aphasia, right sided weakness     Past Surgical History:  Procedure Laterality Date  . AORTA - BILATERAL FEMORAL ARTERY BYPASS GRAFT Bilateral 01/28/2019   Procedure: AORTA BIFEMORAL BYPASS  GRAFT;  Surgeon: Sherren Kerns, MD;  Location: [facility] OR;  Service: Vascular;  Laterality: Bilateral;  . AORTA BIFEMORAL BYPASS GRAFT  01/28/2019  . DIALYSIS/PERMA CATHETER INSERTION N/A 02/15/2019   Procedure: DIALYSIS/PERMA CATHETER INSERTION;  Surgeon: Sherren Kerns, MD;  Location: [facility] INVASIVE CV LAB;  Service: Cardiovascular;  Laterality: N/A;  . EMBOLECTOMY Right 08/30/2017   Procedure: Right popliteal tibial endarectomy with patch angioplasty;  Surgeon: Sherren Kerns, MD;  Location: [facility] OR;  Service: Vascular;  Laterality: Right;  . ENDARTERECTOMY FEMORAL Bilateral 01/28/2019   Procedure: BILATERAL FEMORAL ENDARTERECTOMY WITH PROFUNDAPLASTY;  Surgeon: Sherren Kerns, MD;  Location: [facility] OR;  Service: Vascular;  Laterality: Bilateral;  . FASCIOTOMY CLOSURE  Bilateral 08/28/2017   Procedure: FASCIOTOMY CLOSURE/BILATERAL  lower legs;  Surgeon: Sherren Kerns, MD;  Location: [facility] OR;  Service: Vascular;  Laterality: Bilateral;  . FEMORAL-POPLITEAL BYPASS GRAFT Bilateral 08/26/2017   Procedure: Bilateral FEMORAL EMBOLECTOMY with  BILATERAL Four Compartment  FASCIOTOMIES.;  Surgeon: Sherren Kerns, MD;  Location: [facility] OR;  Service: Vascular;  Laterality: Bilateral;  . FEMORAL-POPLITEAL BYPASS GRAFT Bilateral 01/28/2019   Procedure: BILATERAL  ILIAC THROMBECTOMY;  Surgeon: Sherren Kerns, MD;  Location: [facility] OR;  Service: Vascular;  Laterality: Bilateral;    No Known Allergies  Current Outpatient Medications  Medication Sig Dispense Refill  . aspirin EC 81 MG tablet Take 1 tablet (81 mg total) by mouth daily. 90 tablet 0  . atorvastatin (LIPITOR) 20 MG tablet Take 1 tablet (20 mg total) by mouth daily. 90 tablet 3  . gabapentin (NEURONTIN) 300 MG capsule Take 1 capsule (300 mg total) by mouth daily. 30 capsule 10  . metoprolol tartrate (LOPRESSOR) 25 MG tablet TAKE ONE TABLET (25MG  TOTAL) BY MOUTH TWO TIMES DAILY 180 tablet 1  . warfarin (COUMADIN) 5 MG tablet TAKE ONE TO ONE AND ONE-HALF TABLETS BY MOUTH DAILY AS DIRECTED 120 tablet 0   No current facility-administered medications for this visit.    Family History  Problem Relation Age of Onset  . Stroke Sister     Social History   Socioeconomic History  . Marital status: Single    Spouse name: Not on file  . Number of children: Not on file  . Years of education: Not on file  . Highest education level: Not on file  Occupational History  . Not on file  Tobacco Use  . Smoking status: Never Smoker  . Smokeless tobacco: Never Used  Vaping Use  . Vaping Use: Never used  Substance and Sexual Activity  . Alcohol use: No  . Drug use: No  . Sexual activity: Not on file  Other Topics Concern  . Not on file  Social History Narrative  . Not on file   Social Determinants of Health    Financial Resource Strain: Not on file  Food Insecurity: Not on file  Transportation Needs: Not on file  Physical Activity: Not on file  Stress: Not on file  Social Connections: Not on file  Intimate Partner Violence: Not on file     REVIEW OF SYSTEMS:  Please see HPI   PHYSICAL EXAMINATION:  Today's Vitals   01/28/21 1517  BP: 114/70  Pulse: 66  Resp: 20  Temp: 98.5 F (36.9 C)  TempSrc: Temporal  SpO2: 96%  Weight: 210 lb 9.6 oz (95.5 kg)  Height: 6\' 4"  (1.93 m)   Body mass index is 25.64 kg/m.   General:  WDWN in NAD; vital signs documented above Gait:  Not observed HENT: WNL, normocephalic Pulmonary: normal non-labored breathing , without wheezing Cardiac: regular HR, without  Murmur; without carotid bruits Abdomen: soft, NT, no masses; aortic pulse is not palpable Skin: without rashes Vascular Exam/Pulses:  Right Left  Radial 2+ (normal) 2+ (normal)  Femoral 2+ (normal) 2+ (normal)  Popliteal Unable to palpate Unable to palpate  DP Brisk monophasic Brisk monophasic  PT Faint monophasic monophasic  Peroneal monophasic monophasic   Extremities: without ischemic changes, without Gangrene , without cellulitis; without open wounds;  Musculoskeletal: no muscle wasting or atrophy  Neurologic: A&O X 3;  No focal weakness or paresthesias are detected Psychiatric:  The pt has Normal affect.   Non-Invasive Vascular Imaging:   ABI's/TBI's on 01/28/2021: Right:  0.66/0.67- Great toe pressure: 92 Left:  0.77/0.60  - Great toe pressure: 82   Previous ABI's/TBI's on 06/30/2020: Right:  0.75/0.43 - Great toe pressure: 57 Left:  0.70/0.46 - Great toe pressure:  61  Previous arterial duplex on 06/30/2020: Summary:  Right: Patent distal limb of aortobifemoral bypass graft. Near  occlusion/total occlusion noted in the proximal and mid superficial  femoral artery and proximal popliteal artery.   Left: Patent distal limb of aortobifemoral bypass graft. Occlusion of  the  popliteal artery.    ASSESSMENT/PLAN:: 70 y.o. male here for follow up for PAD hx of ABF bypass with bilateral CFA endarterectomy, profundoplasty and bilateral iliac thrombectomy in February 2020 and this was done for bilateral rest pain.  Prior to that, he ahd bilateral embolectomy of iliac, femoral, popliteal arteries and 4 compartment fasciotomies also by Dr. Darrick Penna on 08/26/2017.   These events were secondary to atrial fibrillation events.  He has been on warfarin.  Pt also had embolic stroke secondary to his afib.    PAD -pt returns today with complaints of continued numbness and tingling in both feet from mid calf down to his feet.  He had these complaints back in July 2021 when he saw Dr. Darrick Penna and he was started on Gabapentin.  Pt comes in today with same complaints and medication is not helping.  I discussed with Dr. Darrick Penna and pt can increase Gabapentin to tid to see if he can get some relief.  It is felt that this neuropathy is from embolic events from his afib.   Dr. Darrick Penna did not want to start Lyrica given it's sedating component.  -pt will f/u in 6 months with ABI.  AFIB -pt appears to be taking coumadin but has not had his INR checked since October.  There is a note from [facility] saying they tried to call him in December for an appt but could not get through.  Our office called and scheduled him an appt at the [facility] office for Monday 2/14 at 1:30 to have his INR checked.  I discussed with pt the importance of this given his hx that if his INR is not therapeutic, he could have more embolic issues and possibly die from this.    Doreatha Massed, [facility] [phone]  Clinic MD:   Darrick Penna

## 2021-01-28 NOTE — Telephone Encounter (Signed)
Patient showed up to clinic to report that Gabapentin was the incorrect medication. Due to communication barriers, I am unsure if he is having any other difficulty besides tingling which is likely due to peripheral neuropathy. I asked if he had followed up with his PCP, but he indicates he does not have one. I mentioned Dr. Horris Latino and he was unfamiliar. Patient has a recall in July for follow up ABIs and PA visit, added him to appt list today.

## 2021-01-29 ENCOUNTER — Other Ambulatory Visit: Payer: Self-pay

## 2021-01-29 DIAGNOSIS — I739 Peripheral vascular disease, unspecified: Secondary | ICD-10-CM

## 2021-02-01 ENCOUNTER — Other Ambulatory Visit: Payer: Self-pay

## 2021-02-01 ENCOUNTER — Ambulatory Visit (INDEPENDENT_AMBULATORY_CARE_PROVIDER_SITE_OTHER): Payer: Medicare HMO

## 2021-02-01 DIAGNOSIS — I639 Cerebral infarction, unspecified: Secondary | ICD-10-CM | POA: Diagnosis not present

## 2021-02-01 DIAGNOSIS — Z5181 Encounter for therapeutic drug level monitoring: Secondary | ICD-10-CM

## 2021-02-01 DIAGNOSIS — I480 Paroxysmal atrial fibrillation: Secondary | ICD-10-CM

## 2021-02-01 LAB — POCT INR: INR: 2.1 (ref 2.0–3.0)

## 2021-02-01 NOTE — Patient Instructions (Signed)
Continue warfarin 1 tablet daily except 1 1/2 tablets on Mondays. Wednesdays and Fridays Recheck in 6 weeks

## 2021-02-04 ENCOUNTER — Other Ambulatory Visit: Payer: Self-pay | Admitting: Cardiology

## 2021-03-29 ENCOUNTER — Other Ambulatory Visit: Payer: Self-pay | Admitting: Cardiology

## 2021-06-05 ENCOUNTER — Other Ambulatory Visit: Payer: Self-pay | Admitting: Vascular Surgery

## 2021-07-29 ENCOUNTER — Other Ambulatory Visit: Payer: Self-pay

## 2021-07-29 ENCOUNTER — Ambulatory Visit (INDEPENDENT_AMBULATORY_CARE_PROVIDER_SITE_OTHER)
Admission: RE | Admit: 2021-07-29 | Discharge: 2021-07-29 | Disposition: A | Discharge status: Home / Self Care | Payer: Medicare HMO | Source: Ambulatory Visit | Attending: Vascular Surgery | Admitting: Vascular Surgery

## 2021-07-29 ENCOUNTER — Encounter (INDEPENDENT_AMBULATORY_CARE_PROVIDER_SITE_OTHER): Payer: Self-pay

## 2021-07-29 ENCOUNTER — Ambulatory Visit (INDEPENDENT_AMBULATORY_CARE_PROVIDER_SITE_OTHER): Payer: Medicare HMO | Admitting: Vascular Surgery

## 2021-07-29 ENCOUNTER — Encounter: Payer: Self-pay | Admitting: Vascular Surgery

## 2021-07-29 ENCOUNTER — Ambulatory Visit (HOSPITAL_COMMUNITY)
Admission: RE | Admit: 2021-07-29 | Discharge: 2021-07-29 | Disposition: A | Discharge status: Home / Self Care | Payer: Medicare HMO | Source: Ambulatory Visit | Attending: Vascular Surgery | Admitting: Vascular Surgery

## 2021-07-29 VITALS — BP 116/64 | HR 80 | Temp 97.9°F | Resp 20 | Ht 76.0 in | Wt 216.0 lb

## 2021-07-29 DIAGNOSIS — I739 Peripheral vascular disease, unspecified: Secondary | ICD-10-CM | POA: Diagnosis not present

## 2021-07-29 NOTE — Progress Notes (Signed)
Patient is a 70 year old male who returns for follow-up today.  He has been seen in the past for bilateral numbness and tingling in his feet secondary to neuropathy.  He also has a history of peripheral arterial disease.  He had an aortobifemoral bypass 2020 and popliteal embolectomies 2018 in the past.  This was thought to be secondary to atrial fibrillation.  He is on warfarin aspirin and a statin.  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  Past Medical History:  Diagnosis Date   Afib (HCC)    a. Paroxysmal --> previous issues with bradycardia while on Amiodarone and he refused PPM placement. Rate-control strategy pursued.    Aortic stenosis    Chronic kidney disease    unknown problem to see kidney  doctor in March in [place]   Dysrhythmia    Atril Fib   Peripheral vascular disease (HCC)    Stroke (HCC)    Aphasia, right sided weakness      Current Outpatient Medications on File Prior to Visit  Medication Sig Dispense Refill   aspirin EC 81 MG tablet Take 1 tablet (81 mg total) by mouth daily. 90 tablet 0   atorvastatin (LIPITOR) 20 MG tablet TAKE ONE TABLET (20MG  TOTAL) BY MOUTH DAILY 90 tablet 1   gabapentin (NEURONTIN) 300 MG capsule Take 1 capsule (300 mg total) by mouth daily. 30 capsule 10   metoprolol tartrate (LOPRESSOR) 25 MG tablet TAKE ONE TABLET (25MG  TOTAL) BY MOUTH TWO TIMES DAILY 60 tablet 1   warfarin (COUMADIN) 5 MG tablet TAKE ONE TO ONE AND ONE-HALF TABLETS BY MOUTH DAILY AS DIRECTED 120 tablet 0   No current facility-administered medications on file prior to visit.   Physical exam:  Vitals:   07/29/21 1056  BP: 116/64  Pulse: 80  Resp: 20  Temp: 97.9 F (36.6 C)  SpO2: 95%  Weight: 216 lb (98 kg)  Height: 6\' 4"  (1.93 m)    Abdomen: Soft nontender no obvious hernia defect  Extremities: 2+ femoral pulses absent popliteal and pedal pulses feet are pink and warm no ulceration  Data: Patient had bilateral ABIs and duplex scan  today which I reviewed and interpreted.  Right side ABI was 0.77 left side was 0.83 this is essentially unchanged from his previous ABIs in July 2021.  Duplex ultrasound today shows right superficial femoral artery and popliteal artery occlusion.  Aortobifem was patent.  Left popliteal artery is also occluded.  He did have monophasic flow in the tibial vessels.  I reviewed and interpreted the study.  Assessment: Doing well status post aortobifemoral bypass previous femoral-popliteal embolectomies.  States he is compliant with his warfarin.  He has had previous embolic events to his legs and his brain.  He does not report any claudication symptoms currently.  Plan: Patient will follow up in 1 year with bilateral ABIs.  He will follow-up sooner if he develops recurrent claudication symptoms.  It was emphasized the patient how important it is to continue his warfarin.   09/28/21, MD [facility] [place] Office: [phone]

## 2021-07-30 ENCOUNTER — Telehealth: Payer: Self-pay

## 2021-07-30 NOTE — Telephone Encounter (Signed)
Lmom for overdue inr 

## 2021-09-21 ENCOUNTER — Telehealth: Payer: Self-pay

## 2021-09-21 NOTE — Telephone Encounter (Signed)
Pt INR overdue. Called pt and pt's emergency contact to make aware.

## 2021-09-22 ENCOUNTER — Other Ambulatory Visit: Payer: Self-pay | Admitting: Vascular Surgery

## 2021-10-21 ENCOUNTER — Telehealth: Payer: Self-pay

## 2021-10-21 NOTE — Telephone Encounter (Signed)
I tried calling patient and contacts, but unfortunately phone numbers not functioning.

## 2021-10-26 ENCOUNTER — Telehealth: Payer: Self-pay

## 2021-10-26 NOTE — Telephone Encounter (Signed)
Pt INR appt overdue. Called pt as well as all emergency contacts. Unable to reach anyone at this time.

## 2021-11-25 ENCOUNTER — Other Ambulatory Visit: Payer: Self-pay

## 2022-01-17 ENCOUNTER — Ambulatory Visit (INDEPENDENT_AMBULATORY_CARE_PROVIDER_SITE_OTHER): Payer: Medicare HMO

## 2022-01-17 ENCOUNTER — Other Ambulatory Visit: Payer: Self-pay

## 2022-01-17 ENCOUNTER — Other Ambulatory Visit: Payer: Self-pay | Admitting: Vascular Surgery

## 2022-01-17 DIAGNOSIS — I480 Paroxysmal atrial fibrillation: Secondary | ICD-10-CM | POA: Diagnosis not present

## 2022-01-17 DIAGNOSIS — Z5181 Encounter for therapeutic drug level monitoring: Secondary | ICD-10-CM

## 2022-01-17 DIAGNOSIS — I639 Cerebral infarction, unspecified: Secondary | ICD-10-CM | POA: Diagnosis not present

## 2022-01-17 LAB — POCT INR: INR: 1.9 — AB (ref 2.0–3.0)

## 2022-01-17 NOTE — Patient Instructions (Signed)
TAKE 2 TABLETS TONIGHT Continue warfarin 1 tablet daily except 1 1/2 tablets on Mondays. Wednesdays and Fridays Recheck in 6 weeks

## 2022-01-24 ENCOUNTER — Other Ambulatory Visit: Payer: Self-pay | Admitting: Cardiology

## 2022-02-21 DIAGNOSIS — R0602 Shortness of breath: Secondary | ICD-10-CM | POA: Insufficient documentation

## 2022-02-21 NOTE — Progress Notes (Signed)
?  ?Cardiology Office Note ? ? ?Date:  02/22/2022  ? ?ID:  [name] [name], DOB 1951-11-17, MRN [id number] ? ?PCP:  Toma Deiters, MD  ?Cardiologist:   Rollene Rotunda, MD ? ? ?Chief Complaint  ?Patient presents with  ? Atrial Fibrillation  ? ? ?  ?History of Present Illness: ?[name] [name] is a 71 y.o. male who presents for follow up of  PVD status post aortobifemoral bypass with bilateral common femoral endarterectomy and profundoplasty with bilateral iliac thrombectomy by Dr. Darrick Penna on 01/28/2019.  Post op he had atrial fib that was somewhat difficult to control.  He had AKI.  He had a post op ileus and a protracted course. He did eventually sustain NSR.   He has had a previous stroke.   ? ?He returns for follow-up.  He says he is doing well.  He has right-sided hemiparesis and dysarthria.  This  The patient denies any new symptoms such as chest discomfort, neck or arm discomfort. There has been no new shortness of breath, PND or orthopnea. There have been no reported palpitations, presyncope or syncope.  ? ? ?Past Medical History:  ?Diagnosis Date  ? Afib (HCC)   ? a. Paroxysmal --> previous issues with bradycardia while on Amiodarone and he refused PPM placement. Rate-control strategy pursued.   ? Aortic stenosis   ? Chronic kidney disease   ? unknown problem to see kidney  doctor in March in [place]  ? Dysrhythmia   ? Atril Fib  ? Peripheral vascular disease (HCC)   ? Stroke [facility])   ? Aphasia, right sided weakness   ? ? ?Past Surgical History:  ?Procedure Laterality Date  ? AORTA - BILATERAL FEMORAL ARTERY BYPASS GRAFT Bilateral 01/28/2019  ? Procedure: AORTA BIFEMORAL BYPASS GRAFT;  Surgeon: Sherren Kerns, MD;  Location: [facility] OR;  Service: Vascular;  Laterality: Bilateral;  ? AORTA BIFEMORAL BYPASS GRAFT  01/28/2019  ? DIALYSIS/PERMA CATHETER INSERTION N/A 02/15/2019  ? Procedure: DIALYSIS/PERMA CATHETER INSERTION;  Surgeon: Sherren Kerns, MD;  Location: [facility] INVASIVE CV LAB;  Service:  Cardiovascular;  Laterality: N/A;  ? EMBOLECTOMY Right 08/30/2017  ? Procedure: Right popliteal tibial endarectomy with patch angioplasty;  Surgeon: Sherren Kerns, MD;  Location: [facility] OR;  Service: Vascular;  Laterality: Right;  ? ENDARTERECTOMY FEMORAL Bilateral 01/28/2019  ? Procedure: BILATERAL FEMORAL ENDARTERECTOMY WITH PROFUNDAPLASTY;  Surgeon: Sherren Kerns, MD;  Location: [facility] OR;  Service: Vascular;  Laterality: Bilateral;  ? FASCIOTOMY CLOSURE Bilateral 08/28/2017  ? Procedure: FASCIOTOMY CLOSURE/BILATERAL  lower legs;  Surgeon: Sherren Kerns, MD;  Location: [facility] OR;  Service: Vascular;  Laterality: Bilateral;  ? FEMORAL-POPLITEAL BYPASS GRAFT Bilateral 08/26/2017  ? Procedure: Bilateral FEMORAL EMBOLECTOMY with  BILATERAL Four Compartment  FASCIOTOMIES.;  Surgeon: Sherren Kerns, MD;  Location: [facility] OR;  Service: Vascular;  Laterality: Bilateral;  ? FEMORAL-POPLITEAL BYPASS GRAFT Bilateral 01/28/2019  ? Procedure: BILATERAL  ILIAC THROMBECTOMY;  Surgeon: Sherren Kerns, MD;  Location: [facility] OR;  Service: Vascular;  Laterality: Bilateral;  ? ? ? ?Current Outpatient Medications  ?Medication Sig Dispense Refill  ? aspirin EC 81 MG tablet Take 1 tablet (81 mg total) by mouth daily. 90 tablet 0  ? atorvastatin (LIPITOR) 20 MG tablet TAKE ONE TABLET (20MG  TOTAL) BY MOUTH DAILY 90 tablet 1  ? gabapentin (NEURONTIN) 300 MG capsule Take 1 capsule (300 mg total) by mouth daily. 30 capsule 10  ? warfarin (COUMADIN) 5 MG tablet TAKE ONE TO ONE AND ONE-HALF TABLETS  BY MOUTH DAILY AS DIRECTED 120 tablet 0  ? metoprolol tartrate (LOPRESSOR) 50 MG tablet Take 1 tablet (50 mg total) by mouth 2 (two) times daily. 180 tablet 3  ? ?No current facility-administered medications for this visit.  ? ? ?Allergies:   Patient has no known allergies.  ? ? ?ROS:  Please see the history of present illness.   Otherwise, review of systems are positive for none.   All other systems are reviewed and negative.  ? ? ?PHYSICAL EXAM: ?VS:  BP  120/82   Pulse (!) 115   Ht 6\' 4"  (1.93 m)   Wt 209 lb 9.6 oz (95.1 kg)   SpO2 96%   BMI 25.51 kg/m?  , BMI Body mass index is 25.51 kg/m?. ?GENERAL:  Well appearing ?NECK:  No jugular venous distention, waveform within normal limits, carotid upstroke brisk and symmetric, no bruits, no thyromegaly ?LUNGS:  Clear to auscultation bilaterally ?CHEST:  Unremarkable ?HEART:  PMI not displaced or sustained,S1 and S2 within normal limits, no S3, no clicks, no rubs, no murmur, irregular  ?ABD:  Flat, positive bowel sounds normal in frequency in pitch, no bruits, no rebound, no guarding, no midline pulsatile mass, no hepatomegaly, no splenomegaly ?EXT:  2 plus pulses throughout, no edema, no cyanosis no clubbing ?NEURO:   Right hemiparesis ? ?EKG:  EKG is ordered today. ?The ekg ordered today demonstrates atrial fibrillation, rate 115, axis within normal limits, early transition lead V2, no acute ST-T wave changes. ? ? ?Recent Labs: ?No results found for requested labs within last 8760 hours.  ? ? ?Lipid Panel ?   ?Component Value Date/Time  ? CHOL 149 08/23/2017 0425  ? TRIG 44 02/25/2019 0437  ? HDL 51 08/23/2017 0425  ? CHOLHDL 2.9 08/23/2017 0425  ? VLDL 20 08/23/2017 0425  ? LDLCALC 78 08/23/2017 0425  ? ?  ? ?Wt Readings from Last 3 Encounters:  ?02/22/22 209 lb 9.6 oz (95.1 kg)  ?07/29/21 216 lb (98 kg)  ?01/28/21 210 lb 9.6 oz (95.5 kg)  ?  ? ? ?Other studies Reviewed: ?Additional studies/ records that were reviewed today include: Labs. ?Review of the above records demonstrates:  Please see elsewhere in the note.   ? ? ?ASSESSMENT AND PLAN: ? ?ATRIAL FLUTTER FIB: I am going to increase his metoprolol to 50 mg twice daily because his rate is elevated.  I would like his INR to be 2 and 4 prior. ?To work on this and make sure he has adequate follow-up in see if he can be closer to home.  This is less expensive so would like to try to keep him on warfarin.  He also maintains aspirin given his previous CVA and  suggestions by neurology.   I will check a CBC ? ?PVD: I will check a lipid profile today although he is not fasting.  I will be checking labs to include liver profile and chemistry. ? ? ?Current medicines are reviewed at length with the patient today.  The patient does not have concerns regarding medicines. ? ?The following changes have been made: As above ? ?Labs/ tests ordered today include:  ? ?Orders Placed This Encounter  ?Procedures  ? CBC with Differential/Platelet  ? Lipid panel  ? Comprehensive metabolic panel  ? EKG 12-Lead  ? ? ? ?Disposition:   FU with me in 1 year ? ? ?Signed, ?03/28/21, MD  ?02/22/2022 4:02 PM    ?[facility] ? ? ? ?

## 2022-02-22 ENCOUNTER — Encounter: Payer: Self-pay | Admitting: Cardiology

## 2022-02-22 ENCOUNTER — Ambulatory Visit (INDEPENDENT_AMBULATORY_CARE_PROVIDER_SITE_OTHER): Payer: Medicare HMO | Admitting: Pharmacist

## 2022-02-22 ENCOUNTER — Other Ambulatory Visit: Payer: Self-pay

## 2022-02-22 ENCOUNTER — Ambulatory Visit: Payer: Medicare HMO | Admitting: Cardiology

## 2022-02-22 VITALS — BP 120/82 | HR 115 | Ht 76.0 in | Wt 209.6 lb

## 2022-02-22 DIAGNOSIS — Z5181 Encounter for therapeutic drug level monitoring: Secondary | ICD-10-CM | POA: Diagnosis not present

## 2022-02-22 DIAGNOSIS — I480 Paroxysmal atrial fibrillation: Secondary | ICD-10-CM

## 2022-02-22 DIAGNOSIS — R0602 Shortness of breath: Secondary | ICD-10-CM | POA: Diagnosis not present

## 2022-02-22 DIAGNOSIS — I639 Cerebral infarction, unspecified: Secondary | ICD-10-CM | POA: Diagnosis not present

## 2022-02-22 DIAGNOSIS — Z13220 Encounter for screening for lipoid disorders: Secondary | ICD-10-CM

## 2022-02-22 LAB — POCT INR: INR: 3.3 — AB (ref 2.0–3.0)

## 2022-02-22 MED ORDER — METOPROLOL TARTRATE 50 MG PO TABS: 50.0000 mg | ORAL_TABLET | Freq: Two times a day (BID) | ORAL | 3 refills | Status: AC

## 2022-02-22 NOTE — Addendum Note (Signed)
Addended by: Cheree Ditto on: 02/22/2022 05:06 PM ? ? Modules accepted: Level of Service ? ?

## 2022-02-22 NOTE — Patient Instructions (Signed)
Medication Instructions:  ?INCREASE YOUR METOPROLOL TO 50 MG TWICE A DAY  ? ?*If you need a refill on your cardiac medications before your next appointment, please call your pharmacy* ? ?Lab Work: ?LP/CMET/CBC TODAY  ? ?If you have labs (blood work) drawn today and your tests are completely normal, you will receive your results only by: ?MyChart Message (if you have MyChart) OR ?A paper copy in the mail ?If you have any lab test that is abnormal or we need to change your treatment, we will call you to review the results. ? ?Testing/Procedures: ?NONE  ? ?Follow-Up: ?At [facility], you and your health needs are our priority.  As part of our continuing mission to provide you with exceptional heart care, we have created designated Provider Care Teams.  These Care Teams include your primary Cardiologist (physician) and Advanced Practice Providers (APPs -  Physician Assistants and Nurse Practitioners) who all work together to provide you with the care you need, when you need it. ? ?We recommend signing up for the patient portal called "MyChart".  Sign up information is provided on this After Visit Summary.  MyChart is used to connect with patients for Virtual Visits (Telemedicine).  Patients are able to view lab/test results, encounter notes, upcoming appointments, etc.  Non-urgent messages can be sent to your provider as well.   ?To learn more about what you can do with MyChart, go to [email or web address].   ? ?Your next appointment:   ?12 month(s) ? ?The format for your next appointment:   ?In Person ? ?Provider:   ?Minus Breeding, MD   ? ? ?

## 2022-02-22 NOTE — Patient Instructions (Signed)
Description   ?Hold warfarin tonight and then continue 1 tablet daily except 1 1/2 tablets on Mondays, Wednesdays and Fridays ?Recheck in 2 weeks ?  ? ? ?

## 2022-02-23 LAB — COMPREHENSIVE METABOLIC PANEL
ALT: 15 IU/L (ref 0–44)
AST: 19 IU/L (ref 0–40)
Albumin/Globulin Ratio: 1.3 (ref 1.2–2.2)
Albumin: 4.3 g/dL (ref 3.7–4.7)
Alkaline Phosphatase: 85 IU/L (ref 44–121)
BUN/Creatinine Ratio: 17 (ref 10–24)
BUN: 25 mg/dL (ref 8–27)
Bilirubin Total: 0.7 mg/dL (ref 0.0–1.2)
CO2: 20 mmol/L (ref 20–29)
Calcium: 9.7 mg/dL (ref 8.6–10.2)
Chloride: 99 mmol/L (ref 96–106)
Creatinine, Ser: 1.47 mg/dL — ABNORMAL HIGH (ref 0.76–1.27)
Globulin, Total: 3.2 g/dL (ref 1.5–4.5)
Glucose: 84 mg/dL (ref 70–99)
Potassium: 5 mmol/L (ref 3.5–5.2)
Sodium: 139 mmol/L (ref 134–144)
Total Protein: 7.5 g/dL (ref 6.0–8.5)
eGFR: 50 mL/min/{1.73_m2} — ABNORMAL LOW (ref >59)

## 2022-02-23 LAB — CBC WITH DIFFERENTIAL/PLATELET
Basophils Absolute: 0 10*3/uL (ref 0.0–0.2)
Basos: 0 %
EOS (ABSOLUTE): 0.2 10*3/uL (ref 0.0–0.4)
Eos: 2 %
Hematocrit: 51.1 % — ABNORMAL HIGH (ref 37.5–51.0)
Hemoglobin: 17.7 g/dL (ref 13.0–17.7)
Immature Grans (Abs): 0 10*3/uL (ref 0.0–0.1)
Immature Granulocytes: 0 %
Lymphocytes Absolute: 0.9 10*3/uL (ref 0.7–3.1)
Lymphs: 9 %
MCH: 31.4 pg (ref 26.6–33.0)
MCHC: 34.6 g/dL (ref 31.5–35.7)
MCV: 91 fL (ref 79–97)
Monocytes Absolute: 0.8 10*3/uL (ref 0.1–0.9)
Monocytes: 8 %
Neutrophils Absolute: 8.1 10*3/uL — ABNORMAL HIGH (ref 1.4–7.0)
Neutrophils: 81 %
Platelets: 139 10*3/uL — ABNORMAL LOW (ref 150–450)
RBC: 5.64 x10E6/uL (ref 4.14–5.80)
RDW: 12.9 % (ref 11.6–15.4)
WBC: 9.9 10*3/uL (ref 3.4–10.8)

## 2022-02-23 LAB — LIPID PANEL
Chol/HDL Ratio: 3.8 ratio (ref 0.0–5.0)
Cholesterol, Total: 235 mg/dL — ABNORMAL HIGH (ref 100–199)
HDL: 62 mg/dL (ref >39)
LDL Chol Calc (NIH): 120 mg/dL — ABNORMAL HIGH (ref 0–99)
Triglycerides: 305 mg/dL — ABNORMAL HIGH (ref 0–149)
VLDL Cholesterol Cal: 53 mg/dL — ABNORMAL HIGH (ref 5–40)

## 2022-02-28 ENCOUNTER — Encounter: Payer: Self-pay | Admitting: *Deleted

## 2022-03-01 ENCOUNTER — Telehealth: Payer: Self-pay | Admitting: Cardiology

## 2022-03-01 NOTE — Telephone Encounter (Signed)
New Message: ? ? ? ?They need to know he correct milligram of patient's Metoprolol. ?

## 2022-03-01 NOTE — Telephone Encounter (Signed)
Contacted the pharmacy- advised that it was increased to Metoprolol 50 twice daily per note from MD. ?Pharmacy verbalized understanding. ? ?

## 2022-03-09 ENCOUNTER — Ambulatory Visit (INDEPENDENT_AMBULATORY_CARE_PROVIDER_SITE_OTHER): Payer: Medicare PPO

## 2022-03-09 ENCOUNTER — Other Ambulatory Visit: Payer: Self-pay

## 2022-03-09 DIAGNOSIS — Z5181 Encounter for therapeutic drug level monitoring: Secondary | ICD-10-CM | POA: Diagnosis not present

## 2022-03-09 DIAGNOSIS — I639 Cerebral infarction, unspecified: Secondary | ICD-10-CM | POA: Diagnosis not present

## 2022-03-09 DIAGNOSIS — I480 Paroxysmal atrial fibrillation: Secondary | ICD-10-CM | POA: Diagnosis not present

## 2022-03-09 LAB — POCT INR: INR: 3.2 — AB (ref 2.0–3.0)

## 2022-03-09 NOTE — Patient Instructions (Signed)
Hold warfarin tomorrow and then decrease to 1 tablet daily except 1 1/2 tablets on Mondays and Fridays ?Recheck in 4 weeks ?

## 2022-04-07 ENCOUNTER — Ambulatory Visit (INDEPENDENT_AMBULATORY_CARE_PROVIDER_SITE_OTHER): Payer: Medicare PPO

## 2022-04-07 DIAGNOSIS — Z5181 Encounter for therapeutic drug level monitoring: Secondary | ICD-10-CM

## 2022-04-07 DIAGNOSIS — I480 Paroxysmal atrial fibrillation: Secondary | ICD-10-CM | POA: Diagnosis not present

## 2022-04-07 DIAGNOSIS — I639 Cerebral infarction, unspecified: Secondary | ICD-10-CM

## 2022-04-07 LAB — POCT INR: INR: 3.3 — AB (ref 2.0–3.0)

## 2022-04-07 NOTE — Patient Instructions (Signed)
Continue 1 tablet daily except 1 1/2 tablets on Mondays and Fridays.  Eat greens tonight ?Recheck in 4 weeks ?

## 2022-05-05 ENCOUNTER — Ambulatory Visit (INDEPENDENT_AMBULATORY_CARE_PROVIDER_SITE_OTHER): Payer: Medicare PPO

## 2022-05-05 DIAGNOSIS — I480 Paroxysmal atrial fibrillation: Secondary | ICD-10-CM

## 2022-05-05 DIAGNOSIS — Z5181 Encounter for therapeutic drug level monitoring: Secondary | ICD-10-CM | POA: Diagnosis not present

## 2022-05-05 DIAGNOSIS — I639 Cerebral infarction, unspecified: Secondary | ICD-10-CM | POA: Diagnosis not present

## 2022-05-05 LAB — POCT INR: INR: 4.2 — AB (ref 2.0–3.0)

## 2022-05-05 NOTE — Patient Instructions (Signed)
HOLD TODAY ONLY and then DECREASE TO 1 tablet daily except 1 1/2 tablets on Mondays.  Eat greens tonight Recheck in 3 weeks

## 2022-05-18 ENCOUNTER — Other Ambulatory Visit: Payer: Self-pay | Admitting: Vascular Surgery

## 2022-05-26 ENCOUNTER — Ambulatory Visit (INDEPENDENT_AMBULATORY_CARE_PROVIDER_SITE_OTHER): Payer: Medicare PPO

## 2022-05-26 DIAGNOSIS — Z5181 Encounter for therapeutic drug level monitoring: Secondary | ICD-10-CM | POA: Diagnosis not present

## 2022-05-26 DIAGNOSIS — I639 Cerebral infarction, unspecified: Secondary | ICD-10-CM | POA: Diagnosis not present

## 2022-05-26 DIAGNOSIS — I480 Paroxysmal atrial fibrillation: Secondary | ICD-10-CM | POA: Diagnosis not present

## 2022-05-26 LAB — POCT INR: INR: 4.5 — AB (ref 2.0–3.0)

## 2022-05-26 NOTE — Patient Instructions (Signed)
HOLD TODAY AND FRIDAY ONLY and then DECREASE TO 1 tablet daily.  Eat greens tonight Recheck in 2 weeks

## 2022-06-06 ENCOUNTER — Telehealth: Payer: Self-pay

## 2022-06-06 NOTE — Telephone Encounter (Signed)
Tried calling patient, phone not in service.

## 2022-06-09 ENCOUNTER — Ambulatory Visit (INDEPENDENT_AMBULATORY_CARE_PROVIDER_SITE_OTHER): Payer: Medicare PPO

## 2022-06-09 DIAGNOSIS — I480 Paroxysmal atrial fibrillation: Secondary | ICD-10-CM

## 2022-06-09 DIAGNOSIS — I639 Cerebral infarction, unspecified: Secondary | ICD-10-CM

## 2022-06-09 DIAGNOSIS — Z5181 Encounter for therapeutic drug level monitoring: Secondary | ICD-10-CM | POA: Diagnosis not present

## 2022-06-09 LAB — POCT INR: INR: 3.1 — AB (ref 2.0–3.0)

## 2022-06-09 NOTE — Patient Instructions (Signed)
Description   Eat a serving of greens today and continue taking 1 tablet daily.   Stay consistent with greens each week  Recheck in 3 weeks [facility] [phone]

## 2022-06-30 ENCOUNTER — Ambulatory Visit: Payer: Medicare PPO | Admitting: *Deleted

## 2022-06-30 DIAGNOSIS — I639 Cerebral infarction, unspecified: Secondary | ICD-10-CM

## 2022-06-30 DIAGNOSIS — Z5181 Encounter for therapeutic drug level monitoring: Secondary | ICD-10-CM | POA: Diagnosis not present

## 2022-06-30 DIAGNOSIS — I480 Paroxysmal atrial fibrillation: Secondary | ICD-10-CM | POA: Diagnosis not present

## 2022-06-30 LAB — POCT INR: INR: 4.1 — AB (ref 2.0–3.0)

## 2022-06-30 MED ORDER — WARFARIN SODIUM 5 MG PO TABS: ORAL_TABLET | ORAL | 0 refills | Status: DC

## 2022-06-30 NOTE — Patient Instructions (Addendum)
Description   Do not take any Warfarin today then start taking 1 tablet daily except 1/2 tablet on Sundays.  Stay consistent with greens each week. Recheck in 3 weeks.  [facility] [phone]

## 2022-07-21 ENCOUNTER — Ambulatory Visit (INDEPENDENT_AMBULATORY_CARE_PROVIDER_SITE_OTHER): Payer: Medicare PPO

## 2022-07-21 DIAGNOSIS — I639 Cerebral infarction, unspecified: Secondary | ICD-10-CM

## 2022-07-21 DIAGNOSIS — I480 Paroxysmal atrial fibrillation: Secondary | ICD-10-CM | POA: Diagnosis not present

## 2022-07-21 DIAGNOSIS — Z5181 Encounter for therapeutic drug level monitoring: Secondary | ICD-10-CM | POA: Diagnosis not present

## 2022-07-21 LAB — POCT INR: INR: 4 — AB (ref 2.0–3.0)

## 2022-07-21 NOTE — Patient Instructions (Signed)
Do not take any Warfarin today then start taking 1 tablet daily except 1/2 tablet on Sunday and Thursday. Stay consistent with greens each week. Recheck in 3 weeks.  [facility] [phone]

## 2022-08-11 ENCOUNTER — Ambulatory Visit (INDEPENDENT_AMBULATORY_CARE_PROVIDER_SITE_OTHER): Payer: Medicare PPO

## 2022-08-11 DIAGNOSIS — Z5181 Encounter for therapeutic drug level monitoring: Secondary | ICD-10-CM | POA: Diagnosis not present

## 2022-08-11 DIAGNOSIS — I480 Paroxysmal atrial fibrillation: Secondary | ICD-10-CM | POA: Diagnosis not present

## 2022-08-11 DIAGNOSIS — I639 Cerebral infarction, unspecified: Secondary | ICD-10-CM

## 2022-08-11 LAB — POCT INR: INR: 3.1 — AB (ref 2.0–3.0)

## 2022-08-11 NOTE — Patient Instructions (Signed)
Description   Eat greens today and continue taking 1 tablet daily except 1/2 tablet on Sunday and Thursday. Stay consistent with greens each week.  Recheck in 4 weeks.  [facility] [phone]

## 2022-09-08 ENCOUNTER — Ambulatory Visit: Payer: Medicare PPO | Attending: Cardiology | Admitting: *Deleted

## 2022-09-08 DIAGNOSIS — Z5181 Encounter for therapeutic drug level monitoring: Secondary | ICD-10-CM | POA: Diagnosis not present

## 2022-09-08 DIAGNOSIS — I480 Paroxysmal atrial fibrillation: Secondary | ICD-10-CM

## 2022-09-08 DIAGNOSIS — I639 Cerebral infarction, unspecified: Secondary | ICD-10-CM

## 2022-09-08 LAB — POCT INR: INR: 2.3 (ref 2.0–3.0)

## 2022-09-08 NOTE — Patient Instructions (Signed)
Description   Continue taking 1 tablet daily except 1/2 tablet on Sunday and Thursday. Stay consistent with greens each week. Recheck in 4 weeks. [facility] [phone]     

## 2022-10-06 ENCOUNTER — Ambulatory Visit: Payer: Medicare PPO | Attending: Cardiology

## 2022-10-06 DIAGNOSIS — I480 Paroxysmal atrial fibrillation: Secondary | ICD-10-CM

## 2022-10-06 DIAGNOSIS — Z5181 Encounter for therapeutic drug level monitoring: Secondary | ICD-10-CM | POA: Diagnosis not present

## 2022-10-06 DIAGNOSIS — I639 Cerebral infarction, unspecified: Secondary | ICD-10-CM

## 2022-10-06 LAB — POCT INR: INR: 2.6 (ref 2.0–3.0)

## 2022-10-06 NOTE — Patient Instructions (Signed)
Description   Continue taking 1 tablet daily except 1/2 tablet on Sunday and Thursday. Stay consistent with greens each week. Recheck in 6 weeks. [facility] [phone]

## 2022-11-17 ENCOUNTER — Ambulatory Visit: Payer: Medicare PPO | Attending: Cardiology

## 2022-11-17 DIAGNOSIS — I639 Cerebral infarction, unspecified: Secondary | ICD-10-CM | POA: Diagnosis not present

## 2022-11-17 DIAGNOSIS — Z5181 Encounter for therapeutic drug level monitoring: Secondary | ICD-10-CM | POA: Diagnosis not present

## 2022-11-17 DIAGNOSIS — I480 Paroxysmal atrial fibrillation: Secondary | ICD-10-CM | POA: Diagnosis not present

## 2022-11-17 LAB — POCT INR: INR: 2.1 (ref 2.0–3.0)

## 2022-11-17 NOTE — Patient Instructions (Signed)
Description   Continue taking 1 tablet daily except 1/2 tablet on Sunday and Thursday. Stay consistent with greens each week. Recheck in 7 weeks. [facility] [phone]

## 2022-12-29 ENCOUNTER — Ambulatory Visit: Payer: Medicare PPO

## 2022-12-29 ENCOUNTER — Ambulatory Visit: Payer: Medicare PPO | Attending: Cardiology | Admitting: *Deleted

## 2022-12-29 DIAGNOSIS — Z5181 Encounter for therapeutic drug level monitoring: Secondary | ICD-10-CM | POA: Diagnosis not present

## 2022-12-29 DIAGNOSIS — I639 Cerebral infarction, unspecified: Secondary | ICD-10-CM

## 2022-12-29 DIAGNOSIS — I480 Paroxysmal atrial fibrillation: Secondary | ICD-10-CM | POA: Diagnosis not present

## 2022-12-29 LAB — POCT INR: INR: 3.5 — AB (ref 2.0–3.0)

## 2022-12-29 NOTE — Patient Instructions (Signed)
Description   Do not take any warfarin today then continue taking 1 tablet daily except 1/2 tablet on Sunday and Thursday. Stay consistent with greens each week. Recheck in 4 weeks. [facility] [phone]

## 2023-01-09 ENCOUNTER — Other Ambulatory Visit: Payer: Self-pay | Admitting: Cardiology

## 2023-01-09 DIAGNOSIS — I639 Cerebral infarction, unspecified: Secondary | ICD-10-CM

## 2023-01-09 DIAGNOSIS — Z5181 Encounter for therapeutic drug level monitoring: Secondary | ICD-10-CM

## 2023-01-09 DIAGNOSIS — I480 Paroxysmal atrial fibrillation: Secondary | ICD-10-CM

## 2023-01-09 NOTE — Telephone Encounter (Signed)
Refill request for warfarin:  Last INR was 3.5 on 12/29/22 Next INR due 01/26/23 LOV was 02/22/22  Refill approved.

## 2023-01-26 ENCOUNTER — Ambulatory Visit: Payer: Medicare PPO | Attending: Cardiology | Admitting: *Deleted

## 2023-01-26 DIAGNOSIS — I639 Cerebral infarction, unspecified: Secondary | ICD-10-CM

## 2023-01-26 DIAGNOSIS — I480 Paroxysmal atrial fibrillation: Secondary | ICD-10-CM

## 2023-01-26 DIAGNOSIS — Z5181 Encounter for therapeutic drug level monitoring: Secondary | ICD-10-CM

## 2023-01-26 LAB — POCT INR: INR: 2.2 (ref 2.0–3.0)

## 2023-01-26 NOTE — Patient Instructions (Signed)
Description   Continue taking 1 tablet daily except 1/2 tablet on Sunday and Thursday. Stay consistent with greens each week. Recheck in 4 weeks. [facility] [phone]

## 2023-03-02 ENCOUNTER — Ambulatory Visit: Payer: Medicare PPO | Attending: Internal Medicine | Admitting: *Deleted

## 2023-03-02 DIAGNOSIS — Z5181 Encounter for therapeutic drug level monitoring: Secondary | ICD-10-CM | POA: Diagnosis not present

## 2023-03-02 DIAGNOSIS — I480 Paroxysmal atrial fibrillation: Secondary | ICD-10-CM | POA: Diagnosis not present

## 2023-03-02 DIAGNOSIS — I639 Cerebral infarction, unspecified: Secondary | ICD-10-CM

## 2023-03-02 LAB — POCT INR: INR: 1.8 — AB (ref 2.0–3.0)

## 2023-03-02 NOTE — Patient Instructions (Addendum)
Description   Today take 1 tablet of warfarin then continue taking 1 tablet daily except 1/2 tablet on Sunday and Thursday. Stay consistent with greens each week. Recheck in 5 weeks. [facility] [phone]

## 2023-04-06 ENCOUNTER — Ambulatory Visit: Payer: Medicare PPO | Attending: Cardiology | Admitting: *Deleted

## 2023-04-06 DIAGNOSIS — Z5181 Encounter for therapeutic drug level monitoring: Secondary | ICD-10-CM

## 2023-04-06 DIAGNOSIS — I639 Cerebral infarction, unspecified: Secondary | ICD-10-CM

## 2023-04-06 DIAGNOSIS — I480 Paroxysmal atrial fibrillation: Secondary | ICD-10-CM

## 2023-04-06 LAB — POCT INR: INR: 2.3 (ref 2.0–3.0)

## 2023-04-06 NOTE — Patient Instructions (Signed)
Description   Continue taking 1 tablet daily except 1/2 tablet on Sunday and Thursday. Stay consistent with greens each week. Recheck in 5 weeks. [facility] [phone]

## 2023-05-18 ENCOUNTER — Ambulatory Visit: Payer: Medicare PPO | Attending: Cardiology

## 2023-05-18 DIAGNOSIS — I480 Paroxysmal atrial fibrillation: Secondary | ICD-10-CM | POA: Diagnosis not present

## 2023-05-18 DIAGNOSIS — Z5181 Encounter for therapeutic drug level monitoring: Secondary | ICD-10-CM | POA: Diagnosis not present

## 2023-05-18 DIAGNOSIS — I639 Cerebral infarction, unspecified: Secondary | ICD-10-CM

## 2023-05-18 LAB — POCT INR: INR: 1.9 — AB (ref 2.0–3.0)

## 2023-05-18 NOTE — Patient Instructions (Signed)
TAKE 1 TABLET TODAY ONLY THEN Continue taking 1 tablet daily except 1/2 tablet on Sunday and Thursday. Stay consistent with greens each week. Recheck in 4 weeks. [facility] [phone]

## 2023-06-15 ENCOUNTER — Ambulatory Visit: Payer: Medicare PPO | Attending: Cardiology

## 2023-06-15 DIAGNOSIS — I639 Cerebral infarction, unspecified: Secondary | ICD-10-CM

## 2023-06-15 DIAGNOSIS — I480 Paroxysmal atrial fibrillation: Secondary | ICD-10-CM | POA: Diagnosis not present

## 2023-06-15 DIAGNOSIS — Z5181 Encounter for therapeutic drug level monitoring: Secondary | ICD-10-CM

## 2023-06-15 LAB — POCT INR: INR: 2 (ref 2.0–3.0)

## 2023-06-15 NOTE — Patient Instructions (Signed)
Description   Take 1 tablet today and then continue taking 1 tablet daily except 1/2 tablet on Sunday and Thursday.  Stay consistent with greens each week.  Recheck in 5 weeks.  [facility] [phone]

## 2023-07-20 ENCOUNTER — Ambulatory Visit: Payer: Medicare PPO | Attending: Cardiology | Admitting: *Deleted

## 2023-07-20 DIAGNOSIS — I639 Cerebral infarction, unspecified: Secondary | ICD-10-CM

## 2023-07-20 DIAGNOSIS — I480 Paroxysmal atrial fibrillation: Secondary | ICD-10-CM

## 2023-07-20 DIAGNOSIS — Z5181 Encounter for therapeutic drug level monitoring: Secondary | ICD-10-CM

## 2023-07-20 LAB — POCT INR: INR: 1.9 — AB (ref 2.0–3.0)

## 2023-07-20 NOTE — Patient Instructions (Signed)
Description   Take 1 tablet today and then continue taking 1 tablet daily except 1/2 tablet on Sunday and Thursday.  Stay consistent with greens each week.  Recheck in 5 weeks.  [facility] [phone]

## 2023-08-09 ENCOUNTER — Other Ambulatory Visit: Payer: Self-pay | Admitting: Cardiology

## 2023-08-09 DIAGNOSIS — Z5181 Encounter for therapeutic drug level monitoring: Secondary | ICD-10-CM

## 2023-08-09 DIAGNOSIS — I480 Paroxysmal atrial fibrillation: Secondary | ICD-10-CM

## 2023-08-09 DIAGNOSIS — I639 Cerebral infarction, unspecified: Secondary | ICD-10-CM

## 2023-08-09 NOTE — Telephone Encounter (Signed)
Warfarin 5mg  refill afib Last INR 07/20/23 Last OV 02/22/22-needs appt & will place note on next [facility] Appt

## 2023-08-24 ENCOUNTER — Ambulatory Visit: Payer: Medicare PPO | Attending: Internal Medicine | Admitting: *Deleted

## 2023-08-24 DIAGNOSIS — I480 Paroxysmal atrial fibrillation: Secondary | ICD-10-CM

## 2023-08-24 DIAGNOSIS — Z5181 Encounter for therapeutic drug level monitoring: Secondary | ICD-10-CM | POA: Diagnosis not present

## 2023-08-24 DIAGNOSIS — I639 Cerebral infarction, unspecified: Secondary | ICD-10-CM

## 2023-08-24 LAB — POCT INR: INR: 3.3 — AB (ref 2.0–3.0)

## 2023-08-24 NOTE — Patient Instructions (Signed)
Description   DO not take any warfarin today then continue taking 1 tablet daily except 1/2 tablet on Sunday and Thursday. Stay consistent with greens each week.  Recheck in 4 weeks.  [facility] [phone]

## 2023-08-30 ENCOUNTER — Encounter: Payer: Self-pay | Admitting: Cardiology

## 2023-08-30 NOTE — Progress Notes (Unsigned)
Cardiology Office Note:   Date:  08/31/2023  ID:  [name] [name], DOB Jun 26, 1951, MRN [id number] PCP: Toma Deiters, MD  [facility] Providers Cardiologist:  Rollene Rotunda, MD {  History of Present Illness:   [name] [name] is a 72 y.o. male who presents for follow up of  PVD status post aortobifemoral bypass with bilateral common femoral endarterectomy and profundoplasty with bilateral iliac thrombectomy by Dr. Darrick Penna on 01/28/2019.  Post op he had atrial fib that was somewhat difficult to control.  He had AKI.  He had a post op ileus and a protracted course. He did eventually sustain NSR.  However, he has been persistently in atrial fibrillation and is seen back.  He has had a previous stroke.     He returns for follow-up.  He has been in chronic atrial fibrillation.  He does not really feel this.  He has some residual dysarthria and leg and arm weakness related to his stroke but he gets around pretty well and denies any symptoms. The patient denies any new symptoms such as chest discomfort, neck or arm discomfort. There has been no new shortness of breath, PND or orthopnea. There have been no reported palpitations, presyncope or syncope.      ROS: As stated in the HPI and negative for all other systems.  Studies Reviewed:    EKG:   EKG Interpretation Date/Time:  Thursday August 31 2023 16:09:41 EDT Ventricular Rate:  136 PR Interval:    QRS Duration:  90 QT Interval:  306 QTC Calculation: 460 R Axis:   -18  Text Interpretation: Atrial fibrillation with rapid ventricular response Minimal voltage criteria for LVH, may be normal variant ( R in aVL ) Nonspecific ST and T wave abnormality When compared with ECG of 05-Feb-2019 23:40, the rate is faster Confirmed by Rollene Rotunda ([id number]) on 08/31/2023 4:53:10 PM     Risk Assessment/Calculations:    CHA2DS2-VASc Score = 4   This indicates a 4.8% annual risk of stroke. The patient's score is based upon: CHF  History: 0 HTN History: 0 Diabetes History: 0 Stroke History: 2 Vascular Disease History: 1 Age Score: 1 Gender Score: 0   Physical Exam:   VS:  BP 124/88 (BP Location: Left Arm, Patient Position: Sitting, Cuff Size: Normal)   Pulse 94   Ht 6\' 4"  (1.93 m)   Wt 210 lb (95.3 kg)   SpO2 99%   BMI 25.56 kg/m    Wt Readings from Last 3 Encounters:  08/31/23 210 lb (95.3 kg)  02/22/22 209 lb 9.6 oz (95.1 kg)  07/29/21 216 lb (98 kg)     GEN: Well nourished, well developed in no acute distress NECK: No JVD; No carotid bruits CARDIAC: Irregular RR, no murmurs, rubs, gallops RESPIRATORY:  Clear to auscultation without rales, wheezing or rhonchi  ABDOMEN: Soft, non-tender, non-distended EXTREMITIES:  No edema; No deformity   ASSESSMENT AND PLAN:   ATRIAL FLUTTER FIB:   He has been in persistent atrial fibrillation.  He does not really notice this.  I would like to get a basic metabolic profile and CBC on him.  He can wear a 3-day Zio patch to see if he has good rate control.  He is tolerating anticoagulation.    PVD: I will check a lipid profile when when he comes back for fasting blood work.  Goal LDL will be in the 70s.  Previously was 120.      Follow up with me in one  year.   Signed, Rollene Rotunda, MD

## 2023-08-31 ENCOUNTER — Encounter: Payer: Self-pay | Admitting: Cardiology

## 2023-08-31 ENCOUNTER — Ambulatory Visit (INDEPENDENT_AMBULATORY_CARE_PROVIDER_SITE_OTHER): Payer: Medicare PPO

## 2023-08-31 ENCOUNTER — Ambulatory Visit: Payer: Medicare PPO | Attending: Cardiology | Admitting: Cardiology

## 2023-08-31 VITALS — BP 124/88 | HR 94 | Ht 76.0 in | Wt 210.0 lb

## 2023-08-31 DIAGNOSIS — I480 Paroxysmal atrial fibrillation: Secondary | ICD-10-CM

## 2023-08-31 DIAGNOSIS — I739 Peripheral vascular disease, unspecified: Secondary | ICD-10-CM

## 2023-08-31 DIAGNOSIS — Z13220 Encounter for screening for lipoid disorders: Secondary | ICD-10-CM

## 2023-08-31 NOTE — Progress Notes (Unsigned)
Enrolled patient for a 3 day Zio XT monitor to be mailed to patients home  

## 2023-08-31 NOTE — Patient Instructions (Signed)
Medication Instructions:   No changes  *If you need a refill on your cardiac medications before your next appointment, please call your pharmacy*   Lab Work: Fasting you can go to [facility] in [place] CBC  LIPID If you have labs (blood work) drawn today and your tests are completely normal, you will receive your results only by: [employer] (if you have MyChart) OR A paper copy in the mail If you have any lab test that is abnormal or we need to change your treatment, we will call you to review the results.   Testing/Procedures: Will be mailed to your home 3 to 4 days Your physician has recommended that you wear a holter monitor 3 day Zio . Holter monitors are medical devices that record the heart's electrical activity. Doctors most often use these monitors to diagnose arrhythmias. Arrhythmias are problems with the speed or rhythm of the heartbeat. The monitor is a small, portable device. You can wear one while you do your normal daily activities. This is usually used to diagnose what is causing palpitations/syncope (passing out).   Follow-Up: At [facility], you and your health needs are our priority.  As part of our continuing mission to provide you with exceptional heart care, we have created designated Provider Care Teams.  These Care Teams include your primary Cardiologist (physician) and Advanced Practice Providers (APPs -  Physician Assistants and Nurse Practitioners) who all work together to provide you with the care you need, when you need it.     Your next appointment:   3 month(s)  The format for your next appointment:   In Person  Provider:   Rollene Rotunda, MD    Other Instructions   ZIO XT- Long Term Monitor Instructions  Your physician has requested you wear a ZIO patch monitor for 3 days.  This is a single patch monitor. [employer] supplies one patch monitor per enrollment. Additional stickers are not available. Please do not apply patch if you  will be having a Nuclear Stress Test,  Echocardiogram, Cardiac CT, MRI, or Chest Xray during the period you would be wearing the  monitor. The patch cannot be worn during these tests. You cannot remove and re-apply the  ZIO XT patch monitor.  Your ZIO patch monitor will be mailed 3 day USPS to your address on file. It may take 3-5 days  to receive your monitor after you have been enrolled.  Once you have received your monitor, please review the enclosed instructions. Your monitor  has already been registered assigning a specific monitor serial # to you.  Billing and Patient Assistance Program Information  We have supplied [employer] with any of your insurance information on file for billing purposes. [employer] offers a sliding scale Patient Assistance Program for patients that do not have  insurance, or whose insurance does not completely cover the cost of the ZIO monitor.  You must apply for the Patient Assistance Program to qualify for this discounted rate.  To apply, please call [employer] at [phone], select option 4, select option 2, ask to apply for  Patient Assistance Program. Meredeth Ide will ask your household income, and how many people  are in your household. They will quote your out-of-pocket cost based on that information.  [employer] will also be able to set up a 31-month, interest-free payment plan if needed.  Applying the monitor   Shave hair from upper left chest.  Hold abrader disc by orange tab. Rub abrader in 40 strokes over  the upper left chest as  indicated in your monitor instructions.  Clean area with 4 enclosed alcohol pads. Let dry.  Apply patch as indicated in monitor instructions. Patch will be placed under collarbone on left  side of chest with arrow pointing upward.  Rub patch adhesive wings for 2 minutes. Remove white label marked "1". Remove the white  label marked "2". Rub patch adhesive wings for 2 additional minutes.  While looking in a mirror, press and release  button in center of patch. A small green light will  flash 3-4 times. This will be your only indicator that the monitor has been turned on.  Do not shower for the first 24 hours. You may shower after the first 24 hours.  Press the button if you feel a symptom. You will hear a small click. Record Date, Time and  Symptom in the Patient Logbook.  When you are ready to remove the patch, follow instructions on the last 2 pages of Patient  Logbook. Stick patch monitor onto the last page of Patient Logbook.  Place Patient Logbook in the blue and white box. Use locking tab on box and tape box closed  securely. The blue and white box has prepaid postage on it. Please place it in the mailbox as  soon as possible. Your physician should have your test results approximately 7 days after the  monitor has been mailed back to [facility].  Call [facility] Customer Care at [phone] if you have questions regarding  your ZIO XT patch monitor. Call them immediately if you see an orange light blinking on your  monitor.  If your monitor falls off in less than 4 days, contact our Monitor department at [phone].  If your monitor becomes loose or falls off after 4 days call [employer] at [phone] for  suggestions on securing your monitor

## 2023-09-09 DIAGNOSIS — I480 Paroxysmal atrial fibrillation: Secondary | ICD-10-CM

## 2023-09-21 ENCOUNTER — Ambulatory Visit: Payer: Medicare PPO | Attending: Internal Medicine | Admitting: *Deleted

## 2023-09-21 DIAGNOSIS — I639 Cerebral infarction, unspecified: Secondary | ICD-10-CM

## 2023-09-21 DIAGNOSIS — I480 Paroxysmal atrial fibrillation: Secondary | ICD-10-CM | POA: Diagnosis not present

## 2023-09-21 DIAGNOSIS — Z5181 Encounter for therapeutic drug level monitoring: Secondary | ICD-10-CM | POA: Diagnosis not present

## 2023-09-21 LAB — POCT INR: INR: 2.2 (ref 2.0–3.0)

## 2023-09-21 NOTE — Patient Instructions (Addendum)
Description   Continue taking 1 tablet daily except 1/2 tablet on Sunday and Thursday. Stay consistent with greens each week.  Recheck in 5 weeks.  [facility] [phone]     HAPPY BIRTHDAY TO YOU!!!

## 2023-10-21 DIAGNOSIS — I480 Paroxysmal atrial fibrillation: Secondary | ICD-10-CM | POA: Diagnosis not present

## 2023-10-26 ENCOUNTER — Ambulatory Visit: Payer: Medicare PPO | Attending: Cardiology | Admitting: *Deleted

## 2023-10-26 ENCOUNTER — Encounter: Payer: Self-pay | Admitting: *Deleted

## 2023-10-26 DIAGNOSIS — Z5181 Encounter for therapeutic drug level monitoring: Secondary | ICD-10-CM | POA: Diagnosis not present

## 2023-10-26 DIAGNOSIS — I639 Cerebral infarction, unspecified: Secondary | ICD-10-CM | POA: Diagnosis not present

## 2023-10-26 DIAGNOSIS — I480 Paroxysmal atrial fibrillation: Secondary | ICD-10-CM | POA: Diagnosis not present

## 2023-10-26 LAB — POCT INR: INR: 2.2 (ref 2.0–3.0)

## 2023-10-26 NOTE — Patient Instructions (Signed)
Description   Continue taking 1 tablet daily except 1/2 tablet on Sunday and Thursday. Stay consistent with greens each week. Recheck in 6 weeks. [facility] [phone]

## 2023-11-23 ENCOUNTER — Other Ambulatory Visit: Payer: Self-pay | Admitting: Cardiology

## 2023-11-23 DIAGNOSIS — I639 Cerebral infarction, unspecified: Secondary | ICD-10-CM

## 2023-11-23 DIAGNOSIS — Z5181 Encounter for therapeutic drug level monitoring: Secondary | ICD-10-CM

## 2023-11-23 DIAGNOSIS — I480 Paroxysmal atrial fibrillation: Secondary | ICD-10-CM

## 2023-11-23 NOTE — Telephone Encounter (Signed)
Refill request for warfarin:  Last INR was 2.2 on 10/26/23 Next INR due 12/07/23 LOV was 08/31/23  Refill approved.

## 2023-12-07 ENCOUNTER — Ambulatory Visit: Payer: Medicare PPO | Attending: Internal Medicine

## 2023-12-07 DIAGNOSIS — Z5181 Encounter for therapeutic drug level monitoring: Secondary | ICD-10-CM | POA: Diagnosis not present

## 2023-12-07 DIAGNOSIS — I639 Cerebral infarction, unspecified: Secondary | ICD-10-CM

## 2023-12-07 DIAGNOSIS — I480 Paroxysmal atrial fibrillation: Secondary | ICD-10-CM | POA: Diagnosis not present

## 2023-12-07 LAB — POCT INR: INR: 2 (ref 2.0–3.0)

## 2023-12-07 NOTE — Patient Instructions (Signed)
Description   Take 1 tablets today and then continue taking 1 tablet daily except 1/2 tablet on Sunday and Thursday. Stay consistent with greens each week.  Recheck in 6 weeks.  [facility] [phone]

## 2024-01-18 ENCOUNTER — Ambulatory Visit: Payer: Medicare PPO | Attending: Cardiology | Admitting: Pharmacist

## 2024-01-18 DIAGNOSIS — I639 Cerebral infarction, unspecified: Secondary | ICD-10-CM

## 2024-01-18 DIAGNOSIS — Z5181 Encounter for therapeutic drug level monitoring: Secondary | ICD-10-CM | POA: Diagnosis not present

## 2024-01-18 DIAGNOSIS — I480 Paroxysmal atrial fibrillation: Secondary | ICD-10-CM | POA: Diagnosis not present

## 2024-01-18 LAB — POCT INR: INR: 2.3 (ref 2.0–3.0)

## 2024-01-18 NOTE — Patient Instructions (Signed)
Description   Take 1 tablets today and then continue taking 1 tablet daily except 1/2 tablet on Sunday and Thursday. Stay consistent with greens each week.  Recheck in 6 weeks.  [facility] [phone]

## 2024-02-29 ENCOUNTER — Ambulatory Visit: Payer: Medicare PPO | Attending: Cardiology

## 2024-02-29 DIAGNOSIS — Z5181 Encounter for therapeutic drug level monitoring: Secondary | ICD-10-CM

## 2024-02-29 DIAGNOSIS — I639 Cerebral infarction, unspecified: Secondary | ICD-10-CM

## 2024-02-29 DIAGNOSIS — I480 Paroxysmal atrial fibrillation: Secondary | ICD-10-CM | POA: Diagnosis not present

## 2024-02-29 LAB — POCT INR: INR: 2 (ref 2.0–3.0)

## 2024-02-29 NOTE — Patient Instructions (Signed)
 Description   Take 1 tablets today and then continue taking 1 tablet daily except 1/2 tablet on Sunday and Thursday. Stay consistent with greens each week.  Recheck in 6 weeks.  [facility] [phone]

## 2024-04-10 ENCOUNTER — Ambulatory Visit: Attending: Cardiovascular Disease

## 2024-04-10 DIAGNOSIS — I639 Cerebral infarction, unspecified: Secondary | ICD-10-CM

## 2024-04-10 DIAGNOSIS — Z5181 Encounter for therapeutic drug level monitoring: Secondary | ICD-10-CM | POA: Diagnosis not present

## 2024-04-10 DIAGNOSIS — I480 Paroxysmal atrial fibrillation: Secondary | ICD-10-CM

## 2024-04-10 LAB — POCT INR: INR: 2.3 (ref 2.0–3.0)

## 2024-04-10 NOTE — Patient Instructions (Addendum)
 Description   Continue taking 1 tablet daily except 1/2 tablet on Sundays and Thursdays. Stay consistent with greens each week.  Recheck in 6 weeks.  [facility] [phone]

## 2024-05-22 ENCOUNTER — Encounter

## 2024-05-22 ENCOUNTER — Ambulatory Visit: Attending: Cardiology | Admitting: *Deleted

## 2024-05-22 DIAGNOSIS — I639 Cerebral infarction, unspecified: Secondary | ICD-10-CM | POA: Diagnosis not present

## 2024-05-22 DIAGNOSIS — I480 Paroxysmal atrial fibrillation: Secondary | ICD-10-CM

## 2024-05-22 DIAGNOSIS — Z5181 Encounter for therapeutic drug level monitoring: Secondary | ICD-10-CM | POA: Diagnosis not present

## 2024-05-22 LAB — POCT INR: INR: 2.3 (ref 2.0–3.0)

## 2024-05-22 NOTE — Patient Instructions (Signed)
 Continue taking 1 tablet daily except 1/2 tablet on Sundays and Thursdays. Stay consistent with greens each week.  Recheck in 6 weeks.  [facility] [phone]

## 2024-06-14 NOTE — Progress Notes (Signed)
 Anticoag encounter

## 2024-06-26 ENCOUNTER — Other Ambulatory Visit: Payer: Self-pay | Admitting: Cardiology

## 2024-06-26 DIAGNOSIS — I480 Paroxysmal atrial fibrillation: Secondary | ICD-10-CM

## 2024-06-26 DIAGNOSIS — I639 Cerebral infarction, unspecified: Secondary | ICD-10-CM

## 2024-06-26 DIAGNOSIS — Z5181 Encounter for therapeutic drug level monitoring: Secondary | ICD-10-CM

## 2024-06-26 NOTE — Telephone Encounter (Signed)
 Warfarin 5mg  refill Afib Last INR 05/22/24 Last OV 08/31/23

## 2024-07-03 ENCOUNTER — Ambulatory Visit: Attending: Cardiology | Admitting: *Deleted

## 2024-07-03 DIAGNOSIS — Z5181 Encounter for therapeutic drug level monitoring: Secondary | ICD-10-CM

## 2024-07-03 DIAGNOSIS — I639 Cerebral infarction, unspecified: Secondary | ICD-10-CM | POA: Diagnosis not present

## 2024-07-03 DIAGNOSIS — I480 Paroxysmal atrial fibrillation: Secondary | ICD-10-CM

## 2024-07-03 LAB — POCT INR: INR: 2.9 (ref 2.0–3.0)

## 2024-07-03 NOTE — Patient Instructions (Signed)
 Continue taking 1 tablet daily except 1/2 tablet on Sundays and Thursdays. Stay consistent with greens each week.  Recheck in 6 weeks.  [facility] [phone]

## 2024-08-14 ENCOUNTER — Ambulatory Visit: Attending: Cardiology | Admitting: *Deleted

## 2024-08-14 DIAGNOSIS — Z5181 Encounter for therapeutic drug level monitoring: Secondary | ICD-10-CM

## 2024-08-14 DIAGNOSIS — I480 Paroxysmal atrial fibrillation: Secondary | ICD-10-CM

## 2024-08-14 DIAGNOSIS — I639 Cerebral infarction, unspecified: Secondary | ICD-10-CM

## 2024-08-14 LAB — POCT INR: INR: 2.4 (ref 2.0–3.0)

## 2024-08-14 NOTE — Progress Notes (Signed)
 INR 2.4 Please see anticoagulation encounter

## 2024-08-14 NOTE — Patient Instructions (Signed)
 Continue taking 1 tablet daily except 1/2 tablet on Sundays and Thursdays. Stay consistent with greens each week.  Recheck in 6 weeks.  [facility] [phone]

## 2024-09-25 ENCOUNTER — Ambulatory Visit: Attending: Cardiology | Admitting: *Deleted

## 2024-09-25 DIAGNOSIS — I480 Paroxysmal atrial fibrillation: Secondary | ICD-10-CM | POA: Diagnosis not present

## 2024-09-25 DIAGNOSIS — Z5181 Encounter for therapeutic drug level monitoring: Secondary | ICD-10-CM

## 2024-09-25 DIAGNOSIS — I639 Cerebral infarction, unspecified: Secondary | ICD-10-CM

## 2024-09-25 LAB — POCT INR: INR: 2.8 (ref 2.0–3.0)

## 2024-09-25 NOTE — Progress Notes (Signed)
 INR-2.8 Please see anticoagulation encounter

## 2024-09-25 NOTE — Patient Instructions (Signed)
 Continue taking 1 tablet daily except 1/2 tablet on Sundays and Thursdays. Stay consistent with greens each week.  Recheck in 6 weeks.  [facility] [phone]

## 2024-11-06 ENCOUNTER — Ambulatory Visit: Attending: Cardiology | Admitting: *Deleted

## 2024-11-06 DIAGNOSIS — I639 Cerebral infarction, unspecified: Secondary | ICD-10-CM

## 2024-11-06 DIAGNOSIS — I480 Paroxysmal atrial fibrillation: Secondary | ICD-10-CM

## 2024-11-06 DIAGNOSIS — Z5181 Encounter for therapeutic drug level monitoring: Secondary | ICD-10-CM

## 2024-11-06 LAB — POCT INR: INR: 2.7 (ref 2.0–3.0)

## 2024-11-06 NOTE — Patient Instructions (Signed)
 Continue taking 1 tablet daily except 1/2 tablet on Sundays and Thursdays. Stay consistent with greens each week.  Recheck in 6 weeks.  [facility] [phone]

## 2024-11-06 NOTE — Progress Notes (Signed)
 INR 2.7; Please see anticoagulation encounter

## 2024-12-18 ENCOUNTER — Ambulatory Visit

## 2024-12-18 DIAGNOSIS — Z5181 Encounter for therapeutic drug level monitoring: Secondary | ICD-10-CM

## 2024-12-18 DIAGNOSIS — I480 Paroxysmal atrial fibrillation: Secondary | ICD-10-CM

## 2024-12-18 DIAGNOSIS — I639 Cerebral infarction, unspecified: Secondary | ICD-10-CM

## 2024-12-18 LAB — POCT INR: INR: 2.1 (ref 2.0–3.0)

## 2024-12-18 NOTE — Progress Notes (Signed)
 INR 2.1; Please see anticoagulation encounter

## 2024-12-18 NOTE — Patient Instructions (Signed)
 Continue taking 1 tablet daily except 1/2 tablet on Sundays and Thursdays. Stay consistent with greens each week.  Recheck in 6 weeks.  [facility] [phone]

## 2025-01-29 ENCOUNTER — Ambulatory Visit
